# Patient Record
Sex: Female | Born: 1945
Health system: Southern US, Community
[De-identification: ages and names within clinical notes are randomized; demographics above are authoritative.]

## PROBLEM LIST (undated history)

## (undated) DIAGNOSIS — T783XXA Angioneurotic edema, initial encounter: Secondary | ICD-10-CM

## (undated) DIAGNOSIS — M1990 Unspecified osteoarthritis, unspecified site: Secondary | ICD-10-CM

## (undated) DIAGNOSIS — T7840XA Allergy, unspecified, initial encounter: Secondary | ICD-10-CM

## (undated) DIAGNOSIS — J82 Pulmonary eosinophilia, not elsewhere classified: Secondary | ICD-10-CM

## (undated) DIAGNOSIS — J8281 Chronic eosinophilic pneumonia: Secondary | ICD-10-CM

## (undated) DIAGNOSIS — T4145XA Adverse effect of unspecified anesthetic, initial encounter: Secondary | ICD-10-CM

## (undated) DIAGNOSIS — K449 Diaphragmatic hernia without obstruction or gangrene: Secondary | ICD-10-CM

## (undated) DIAGNOSIS — K219 Gastro-esophageal reflux disease without esophagitis: Secondary | ICD-10-CM

## (undated) DIAGNOSIS — R0989 Other specified symptoms and signs involving the circulatory and respiratory systems: Secondary | ICD-10-CM

## (undated) DIAGNOSIS — J449 Chronic obstructive pulmonary disease, unspecified: Secondary | ICD-10-CM

## (undated) DIAGNOSIS — G4730 Sleep apnea, unspecified: Secondary | ICD-10-CM

## (undated) DIAGNOSIS — T8859XA Other complications of anesthesia, initial encounter: Secondary | ICD-10-CM

## (undated) DIAGNOSIS — R600 Localized edema: Secondary | ICD-10-CM

## (undated) DIAGNOSIS — J189 Pneumonia, unspecified organism: Secondary | ICD-10-CM

## (undated) DIAGNOSIS — E039 Hypothyroidism, unspecified: Secondary | ICD-10-CM

## (undated) DIAGNOSIS — J984 Other disorders of lung: Secondary | ICD-10-CM

## (undated) DIAGNOSIS — R51 Headache: Secondary | ICD-10-CM

## (undated) DIAGNOSIS — R519 Headache, unspecified: Secondary | ICD-10-CM

## (undated) DIAGNOSIS — R062 Wheezing: Secondary | ICD-10-CM

## (undated) DIAGNOSIS — IMO0001 Reserved for inherently not codable concepts without codable children: Secondary | ICD-10-CM

## (undated) DIAGNOSIS — I10 Essential (primary) hypertension: Secondary | ICD-10-CM

## (undated) HISTORY — DX: Angioneurotic edema, initial encounter: T78.3XXA

## (undated) HISTORY — DX: Chronic obstructive pulmonary disease, unspecified: J44.9

## (undated) HISTORY — DX: Headache: R51

## (undated) HISTORY — DX: Allergy, unspecified, initial encounter: T78.40XA

## (undated) HISTORY — DX: Chronic eosinophilic pneumonia: J82.81

## (undated) HISTORY — DX: Other specified symptoms and signs involving the circulatory and respiratory systems: R09.89

## (undated) HISTORY — DX: Gastro-esophageal reflux disease without esophagitis: K21.9

## (undated) HISTORY — DX: Unspecified osteoarthritis, unspecified site: M19.90

## (undated) HISTORY — DX: Pulmonary eosinophilia, not elsewhere classified: J82

## (undated) HISTORY — DX: Diaphragmatic hernia without obstruction or gangrene: K44.9

## (undated) HISTORY — DX: Other disorders of lung: J98.4

## (undated) HISTORY — DX: Headache, unspecified: R51.9

## (undated) HISTORY — DX: Hypothyroidism, unspecified: E03.9

---
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
## 2005-12-28 ENCOUNTER — Ambulatory Visit: Payer: Self-pay | Admitting: Family Medicine

## 2006-01-09 ENCOUNTER — Ambulatory Visit: Payer: Self-pay | Admitting: Pulmonary Disease

## 2006-01-17 ENCOUNTER — Ambulatory Visit: Payer: Self-pay | Admitting: Pulmonary Disease

## 2006-02-06 DIAGNOSIS — T783XXA Angioneurotic edema, initial encounter: Secondary | ICD-10-CM

## 2006-02-06 HISTORY — DX: Angioneurotic edema, initial encounter: T78.3XXA

## 2006-02-19 ENCOUNTER — Emergency Department (HOSPITAL_COMMUNITY): Admission: EM | Admit: 2006-02-19 | Discharge: 2006-02-20 | Payer: Self-pay | Admitting: Emergency Medicine

## 2006-02-21 ENCOUNTER — Ambulatory Visit: Payer: Self-pay | Admitting: Family Medicine

## 2006-03-27 ENCOUNTER — Ambulatory Visit: Payer: Self-pay | Admitting: Family Medicine

## 2006-03-27 ENCOUNTER — Encounter: Payer: Self-pay | Admitting: Family Medicine

## 2006-03-27 ENCOUNTER — Other Ambulatory Visit: Admission: RE | Admit: 2006-03-27 | Discharge: 2006-03-27 | Payer: Self-pay | Admitting: Family Medicine

## 2006-03-27 LAB — CONVERTED CEMR LAB: Pap Smear: NORMAL

## 2006-04-27 ENCOUNTER — Ambulatory Visit: Payer: Self-pay | Admitting: Family Medicine

## 2006-07-06 ENCOUNTER — Ambulatory Visit: Payer: Self-pay | Admitting: Family Medicine

## 2007-05-15 ENCOUNTER — Encounter: Payer: Self-pay | Admitting: Family Medicine

## 2007-05-15 DIAGNOSIS — R32 Unspecified urinary incontinence: Secondary | ICD-10-CM | POA: Insufficient documentation

## 2007-05-15 DIAGNOSIS — E7800 Pure hypercholesterolemia, unspecified: Secondary | ICD-10-CM | POA: Insufficient documentation

## 2007-05-15 DIAGNOSIS — R011 Cardiac murmur, unspecified: Secondary | ICD-10-CM | POA: Insufficient documentation

## 2007-05-15 DIAGNOSIS — J45909 Unspecified asthma, uncomplicated: Secondary | ICD-10-CM | POA: Insufficient documentation

## 2007-05-15 DIAGNOSIS — J309 Allergic rhinitis, unspecified: Secondary | ICD-10-CM | POA: Insufficient documentation

## 2007-05-15 DIAGNOSIS — K056 Periodontal disease, unspecified: Secondary | ICD-10-CM | POA: Insufficient documentation

## 2007-05-15 DIAGNOSIS — K069 Disorder of gingiva and edentulous alveolar ridge, unspecified: Secondary | ICD-10-CM | POA: Insufficient documentation

## 2007-05-17 ENCOUNTER — Ambulatory Visit: Payer: Self-pay | Admitting: Family Medicine

## 2007-05-20 LAB — CONVERTED CEMR LAB
Albumin: 3.7 g/dL (ref 3.5–5.2)
Basophils Absolute: 0.1 10*3/uL (ref 0.0–0.1)
Bilirubin, Direct: 0.1 mg/dL (ref 0.0–0.3)
Chloride: 102 meq/L (ref 96–112)
Cholesterol: 169 mg/dL (ref 0–200)
Eosinophils Absolute: 1.3 10*3/uL — ABNORMAL HIGH (ref 0.0–0.6)
GFR calc Af Amer: 73 mL/min
GFR calc non Af Amer: 60 mL/min
HCT: 39.2 % (ref 36.0–46.0)
Lymphocytes Relative: 53 % — ABNORMAL HIGH (ref 12.0–46.0)
MCHC: 34.6 g/dL (ref 30.0–36.0)
MCV: 91 fL (ref 78.0–100.0)
Monocytes Absolute: 0.4 10*3/uL (ref 0.2–0.7)
Neutro Abs: 1.3 10*3/uL — ABNORMAL LOW (ref 1.4–7.7)
Neutrophils Relative %: 19.9 % — ABNORMAL LOW (ref 43.0–77.0)
Potassium: 3.9 meq/L (ref 3.5–5.1)
RBC: 4.31 M/uL (ref 3.87–5.11)
Sodium: 136 meq/L (ref 135–145)
TSH: 4.34 microintl units/mL (ref 0.35–5.50)
Total CHOL/HDL Ratio: 2.2

## 2007-05-27 ENCOUNTER — Encounter: Payer: Self-pay | Admitting: Family Medicine

## 2007-07-04 ENCOUNTER — Encounter: Payer: Self-pay | Admitting: Family Medicine

## 2007-08-01 ENCOUNTER — Ambulatory Visit: Payer: Self-pay | Admitting: Family Medicine

## 2007-08-01 DIAGNOSIS — K219 Gastro-esophageal reflux disease without esophagitis: Secondary | ICD-10-CM | POA: Insufficient documentation

## 2007-11-20 ENCOUNTER — Ambulatory Visit: Payer: Self-pay | Admitting: Family Medicine

## 2007-11-28 ENCOUNTER — Ambulatory Visit: Payer: Self-pay | Admitting: Family Medicine

## 2008-01-09 ENCOUNTER — Encounter: Payer: Self-pay | Admitting: Family Medicine

## 2008-02-18 ENCOUNTER — Ambulatory Visit: Payer: Self-pay | Admitting: Family Medicine

## 2008-02-18 DIAGNOSIS — N951 Menopausal and female climacteric states: Secondary | ICD-10-CM | POA: Insufficient documentation

## 2008-02-18 DIAGNOSIS — T50905A Adverse effect of unspecified drugs, medicaments and biological substances, initial encounter: Secondary | ICD-10-CM

## 2008-02-18 DIAGNOSIS — R635 Abnormal weight gain: Secondary | ICD-10-CM

## 2008-02-18 DIAGNOSIS — R030 Elevated blood-pressure reading, without diagnosis of hypertension: Secondary | ICD-10-CM | POA: Insufficient documentation

## 2008-02-18 HISTORY — DX: Adverse effect of unspecified drugs, medicaments and biological substances, initial encounter: T50.905A

## 2008-02-18 HISTORY — DX: Menopausal and female climacteric states: N95.1

## 2008-02-18 HISTORY — DX: Adverse effect of unspecified drugs, medicaments and biological substances, initial encounter: R63.5

## 2008-03-25 ENCOUNTER — Telehealth: Payer: Self-pay | Admitting: Family Medicine

## 2008-03-25 ENCOUNTER — Encounter: Payer: Self-pay | Admitting: Family Medicine

## 2008-04-15 ENCOUNTER — Ambulatory Visit: Payer: Self-pay | Admitting: Family Medicine

## 2008-04-15 DIAGNOSIS — M1990 Unspecified osteoarthritis, unspecified site: Secondary | ICD-10-CM | POA: Insufficient documentation

## 2008-04-15 DIAGNOSIS — M7550 Bursitis of unspecified shoulder: Secondary | ICD-10-CM | POA: Insufficient documentation

## 2008-04-20 ENCOUNTER — Telehealth: Payer: Self-pay | Admitting: Family Medicine

## 2008-06-04 ENCOUNTER — Ambulatory Visit: Payer: Self-pay | Admitting: Family Medicine

## 2008-06-04 ENCOUNTER — Telehealth: Payer: Self-pay | Admitting: Family Medicine

## 2008-06-05 LAB — CONVERTED CEMR LAB
AST: 20 units/L (ref 0–37)
Albumin: 3.7 g/dL (ref 3.5–5.2)
BUN: 10 mg/dL (ref 6–23)
Basophils Absolute: 0 10*3/uL (ref 0.0–0.1)
Chloride: 108 meq/L (ref 96–112)
Cholesterol: 146 mg/dL (ref 0–200)
Creatinine, Ser: 1 mg/dL (ref 0.4–1.2)
Eosinophils Absolute: 1.6 10*3/uL — ABNORMAL HIGH (ref 0.0–0.7)
GFR calc Af Amer: 73 mL/min
GFR calc non Af Amer: 60 mL/min
HCT: 42.6 % (ref 36.0–46.0)
HDL: 80.9 mg/dL (ref >39.0)
MCHC: 33.7 g/dL (ref 30.0–36.0)
MCV: 91.2 fL (ref 78.0–100.0)
Monocytes Absolute: 0.5 10*3/uL (ref 0.1–1.0)
Neutrophils Relative %: 26.5 % — ABNORMAL LOW (ref 43.0–77.0)
Platelets: 204 10*3/uL (ref 150–400)
Potassium: 3.8 meq/L (ref 3.5–5.1)
TSH: 4.49 microintl units/mL (ref 0.35–5.50)
Total Bilirubin: 0.7 mg/dL (ref 0.3–1.2)
Triglycerides: 50 mg/dL (ref 0–149)
VLDL: 10 mg/dL (ref 0–40)

## 2008-06-17 ENCOUNTER — Ambulatory Visit: Payer: Self-pay | Admitting: Family Medicine

## 2008-06-25 ENCOUNTER — Encounter: Payer: Self-pay | Admitting: Family Medicine

## 2008-06-30 ENCOUNTER — Ambulatory Visit: Payer: Self-pay | Admitting: Unknown Physician Specialty

## 2008-07-13 ENCOUNTER — Ambulatory Visit: Payer: Self-pay | Admitting: Family Medicine

## 2008-07-20 ENCOUNTER — Ambulatory Visit: Payer: Self-pay | Admitting: Unknown Physician Specialty

## 2008-07-20 ENCOUNTER — Encounter: Payer: Self-pay | Admitting: Family Medicine

## 2008-08-06 ENCOUNTER — Encounter: Payer: Self-pay | Admitting: Family Medicine

## 2008-08-18 ENCOUNTER — Ambulatory Visit: Payer: Self-pay | Admitting: Family Medicine

## 2008-08-18 DIAGNOSIS — J329 Chronic sinusitis, unspecified: Secondary | ICD-10-CM | POA: Insufficient documentation

## 2008-09-21 ENCOUNTER — Encounter: Payer: Self-pay | Admitting: Family Medicine

## 2008-09-24 ENCOUNTER — Encounter: Payer: Self-pay | Admitting: Family Medicine

## 2008-10-01 ENCOUNTER — Encounter (INDEPENDENT_AMBULATORY_CARE_PROVIDER_SITE_OTHER): Payer: Self-pay | Admitting: *Deleted

## 2009-01-07 ENCOUNTER — Encounter: Payer: Self-pay | Admitting: Family Medicine

## 2009-03-03 ENCOUNTER — Ambulatory Visit: Payer: Self-pay | Admitting: Family Medicine

## 2009-03-03 DIAGNOSIS — R609 Edema, unspecified: Secondary | ICD-10-CM | POA: Insufficient documentation

## 2009-03-05 LAB — CONVERTED CEMR LAB
AST: 24 units/L (ref 0–37)
Alkaline Phosphatase: 101 units/L (ref 39–117)
BUN: 16 mg/dL (ref 6–23)
Bilirubin, Direct: 0.1 mg/dL (ref 0.0–0.3)
CO2: 27 meq/L (ref 19–32)
Chloride: 109 meq/L (ref 96–112)
Phosphorus: 3.9 mg/dL (ref 2.3–4.6)
Potassium: 3.8 meq/L (ref 3.5–5.1)
TSH: 4.39 microintl units/mL (ref 0.35–5.50)
Total Bilirubin: 0.7 mg/dL (ref 0.3–1.2)

## 2009-03-25 ENCOUNTER — Telehealth: Payer: Self-pay | Admitting: Family Medicine

## 2009-04-15 ENCOUNTER — Ambulatory Visit: Payer: Self-pay | Admitting: Family Medicine

## 2009-05-10 ENCOUNTER — Ambulatory Visit: Payer: Self-pay | Admitting: Family Medicine

## 2009-05-11 LAB — CONVERTED CEMR LAB
Alkaline Phosphatase: 104 units/L (ref 39–117)
BUN: 13 mg/dL (ref 6–23)
Basophils Relative: 0.5 % (ref 0.0–3.0)
Bilirubin, Direct: 0.2 mg/dL (ref 0.0–0.3)
Calcium: 9 mg/dL (ref 8.4–10.5)
Creatinine, Ser: 1.1 mg/dL (ref 0.4–1.2)
Eosinophils Absolute: 0.7 10*3/uL (ref 0.0–0.7)
HCT: 39.5 % (ref 36.0–46.0)
Hemoglobin: 14 g/dL (ref 12.0–15.0)
LDL Cholesterol: 92 mg/dL (ref 0–99)
Lymphocytes Relative: 49.4 % — ABNORMAL HIGH (ref 12.0–46.0)
Lymphs Abs: 3.6 10*3/uL (ref 0.7–4.0)
MCHC: 35.3 g/dL (ref 30.0–36.0)
MCV: 87.3 fL (ref 78.0–100.0)
Neutro Abs: 2.5 10*3/uL (ref 1.4–7.7)
Phosphorus: 4.1 mg/dL (ref 2.3–4.6)
Potassium: 4.2 meq/L (ref 3.5–5.1)
RBC: 4.53 M/uL (ref 3.87–5.11)
RDW: 12.7 % (ref 11.5–14.6)
Total Bilirubin: 0.8 mg/dL (ref 0.3–1.2)
Total CHOL/HDL Ratio: 3
Total Protein: 7 g/dL (ref 6.0–8.3)
VLDL: 12.8 mg/dL (ref 0.0–40.0)

## 2009-06-01 ENCOUNTER — Ambulatory Visit: Payer: Self-pay | Admitting: Family Medicine

## 2009-06-01 DIAGNOSIS — E039 Hypothyroidism, unspecified: Secondary | ICD-10-CM | POA: Insufficient documentation

## 2009-07-13 ENCOUNTER — Encounter: Payer: Self-pay | Admitting: Family Medicine

## 2009-07-20 ENCOUNTER — Ambulatory Visit: Payer: Self-pay | Admitting: Family Medicine

## 2009-09-07 ENCOUNTER — Ambulatory Visit: Payer: Self-pay | Admitting: Family Medicine

## 2009-12-08 ENCOUNTER — Ambulatory Visit: Payer: Self-pay | Admitting: Family Medicine

## 2010-01-20 ENCOUNTER — Telehealth: Payer: Self-pay | Admitting: Family Medicine

## 2010-02-02 ENCOUNTER — Ambulatory Visit: Payer: Self-pay | Admitting: Family Medicine

## 2010-02-10 ENCOUNTER — Encounter: Payer: Self-pay | Admitting: Family Medicine

## 2010-04-05 ENCOUNTER — Ambulatory Visit: Payer: Self-pay | Admitting: Family Medicine

## 2010-04-06 LAB — CONVERTED CEMR LAB
ALT: 12 units/L (ref 0–35)
CO2: 29 meq/L (ref 19–32)
Creatinine, Ser: 1 mg/dL (ref 0.4–1.2)
GFR calc non Af Amer: 74.79 mL/min (ref >60)
Glucose, Bld: 88 mg/dL (ref 70–99)
HDL: 59.4 mg/dL (ref >39.00)
Potassium: 3.9 meq/L (ref 3.5–5.1)
Sodium: 142 meq/L (ref 135–145)
Total CHOL/HDL Ratio: 4
Triglycerides: 79 mg/dL (ref 0.0–149.0)

## 2010-05-03 ENCOUNTER — Telehealth: Payer: Self-pay | Admitting: Family Medicine

## 2010-07-20 ENCOUNTER — Ambulatory Visit: Payer: Self-pay | Admitting: Family Medicine

## 2010-08-04 ENCOUNTER — Encounter: Payer: Self-pay | Admitting: Family Medicine

## 2010-09-26 ENCOUNTER — Ambulatory Visit: Payer: Self-pay | Admitting: Family Medicine

## 2010-09-26 LAB — CONVERTED CEMR LAB
ALT: 13 units/L (ref 0–35)
AST: 16 units/L (ref 0–37)
Total CHOL/HDL Ratio: 3
Triglycerides: 59 mg/dL (ref 0.0–149.0)

## 2010-10-06 ENCOUNTER — Ambulatory Visit: Payer: Self-pay | Admitting: Family Medicine

## 2010-11-08 NOTE — Assessment & Plan Note (Signed)
Summary: BODY ACHES,COUGH,CONGESTION/CLE   Vital Signs:  Patient profile:   65 year old female Height:      67 inches Weight:      268.75 pounds BMI:     42.24 Temp:     99.7 degrees F oral Pulse rate:   92 / minute Pulse rhythm:   regular BP sitting:   116 / 74  (left arm) Cuff size:   large  Vitals Entered By: Delilah Shan CMA Duncan Dull) (December 08, 2009 8:58 AM) CC: Body aches, cough, congestion   History of Present Illness: is sick since last week  body aches - and some sweats -- and chills - at least low grade fever  nasal congestion and chest congestion - is sore from that  prod cough - with phlegm that is yellow- green and sometimes clear   just a little wheeze  some headache -- thinks she has sinus pressure  throat is sore too     Allergies: 1)  Erythromycin  Past History:  Past Medical History: Last updated: 06/01/2009 Allergic rhinitis Asthma Urinary incontinence OA knees  labile bp hiatal hernia hyothyroid  all- [facility]  Past Surgical History: Last updated: 07/25/2008 Caesarean section x 2 Breast biopsy (1966) Colonoscopy (2005) Pneumonia, 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 pre diabetic Siblings:  GM and aunt with colon ca DM distant relatives lots of family with high chol  Social History: Last updated: 08/18/2008 Marital Status: divorced Children: 2 Occupation: retired non smoker  no alcohol   Risk Factors: Smoking Status: never (05/15/2007)  Review of Systems General:  Complains of chills, fatigue, fever, loss of appetite, and malaise. Eyes:  Complains of eye irritation; denies discharge. ENT:  Complains of nasal congestion, postnasal drainage, sinus pressure, and sore throat; denies ear discharge and earache. CV:  Denies  chest pain or discomfort and palpitations. Resp:  Complains of cough and sputum productive. GI:  Denies diarrhea, nausea, and vomiting. Derm:  Denies lesion(s), poor wound healing, and rash.  Physical Exam  General:  Well-developed,well-nourished,in no acute distress; alert,appropriate and cooperative throughout examination Head:  no sinus tenderness Eyes:  vision grossly intact, pupils equal, pupils round, pupils reactive to light, and no injection.   Ears:  R ear normal and L ear normal.   Nose:  nares are injected and congested bilaterally  some clear rhinorrhea  hoarse voice Mouth:  pharynx pink and moist, no erythema, and no exudates.   Neck:  No deformities, masses, or tenderness noted. Lungs:  some isolated rhonchi at bases - no rales good air exch without sob or wheeze today Heart:  Normal rate and regular rhythm. S1 and S2 normal without gallop, murmur, click, rub or other extra sounds. Skin:  Intact without suspicious lesions or rashes Cervical Nodes:  No lymphadenopathy noted Psych:  normal affect, talkative and pleasant    Impression & Recommendations:  Problem # 1:  BRONCHITIS- ACUTE (ICD-466.0) Assessment New with cough and congestion but no exac of reactive airways will cover with augmentin robitussin- codiene as needed cough with warning of sedation pt advised to update me if symptoms worsen or do not improve - esp if any wheeze or sob or not imp in 1 wk The following medications were removed from the medication list:    Zithromax Z-pak 250 Mg Tabs (Azithromycin) .Marland Kitchen... Take  by mouth as directed Her updated medication list for this problem includes:    Singulair 10 Mg Tabs (Montelukast sodium) ..... One by mouth every day    Albuterol 90 Mcg/act Aers (Albuterol) ..... Use as directed    Accuneb 0.63 Mg/58ml Nebu (Albuterol sulfate) ..... Use 1 vial for nebulizer treatment q 6 hours as needed wheezing    Advair Diskus 250-50 Mcg/dose Misc (Fluticasone-salmeterol)  ..... Use as directed    Augmentin 875-125 Mg Tabs (Amoxicillin-pot clavulanate) .Marland Kitchen... 1 by mouth two times a day for 10 days    Guaifenesin-codeine 100-10 Mg/31ml Soln (Guaifenesin-codeine) .Marland Kitchen... 1-2 teaspoons by mouth up to every 4-6 hours as needed cough  warn of sedation  Complete Medication List: 1)  Lipitor 10 Mg Tabs (Atorvastatin calcium) .Marland Kitchen.. 1 by mouth once daily 2)  Singulair 10 Mg Tabs (Montelukast sodium) .... One by mouth every day 3)  Albuterol 90 Mcg/act Aers (Albuterol) .... Use as directed 4)  Accuneb 0.63 Mg/52ml Nebu (Albuterol sulfate) .... Use 1 vial for nebulizer treatment q 6 hours as needed wheezing 5)  Astepro 137 Mcg/spray Soln (Azelastine hcl) .... Use as directed 6)  Advair Diskus 250-50 Mcg/dose Misc (Fluticasone-salmeterol) .... Use as directed 7)  Clarinex 5 Mg Tabs (Desloratadine) .... One by mouth every day 8)  Ibuprofen 800 Mg Tabs (Ibuprofen) .... Take 1 by mouth up to three times a day as needed joint pain take with food and stop if stomach upset 9)  Px Omeprazole 20 Mg Tbec (Omeprazole) .Marland Kitchen.. 1 by mouth two times a day 10)  Lasix 20 Mg Tabs (Furosemide) .Marland Kitchen.. 1 by mouth once daily in am 11)  Excedrin Extra Strength 250-250-65 Mg Tabs (Aspirin-acetaminophen-caffeine) .... Otc as directed 12)  Sinus Rinse Kit Pack (Hypertonic nasal wash) .... Otc as directed 13)  Synthroid 50 Mcg Tabs (Levothyroxine sodium) .Marland Kitchen.. 1 by mouth once daily 14)  Augmentin 875-125 Mg Tabs (Amoxicillin-pot clavulanate) .Marland Kitchen.. 1 by mouth two times a day for 10 days 15)  Guaifenesin-codeine 100-10 Mg/61ml Soln (Guaifenesin-codeine) .Marland Kitchen.. 1-2 teaspoons by mouth up to every 4-6 hours as needed cough  warn of sedation  Patient Instructions: 1)  get lots of fluids and rest  2)  take the augmentin as directed for bronchitis  3)  use the cough syrup with caution since it can sedate  4)  update me if not starting to improve in 1 week 5)  use tylenol for fever or body aches   Prescriptions: GUAIFENESIN-CODEINE 100-10 MG/5ML SOLN (GUAIFENESIN-CODEINE) 1-2 teaspoons by mouth up to every 4-6 hours as needed cough  warn of sedation  #120cc x 0   Entered and Authorized by:   Judith Part MD   Signed by:   Judith Part MD on 12/08/2009   Method used:   Print then Give to Patient   RxID:   [id number] AUGMENTIN 875-125 MG TABS (AMOXICILLIN-POT CLAVULANATE) 1 by mouth two times a day for 10 days  #20 x 0   Entered and Authorized by:   Judith Part MD   Signed by:   Judith Part MD on 12/08/2009   Method used:   Print then Give to Patient   RxID:   [id number]   Current Allergies (reviewed today): ERYTHROMYCIN

## 2010-11-08 NOTE — Progress Notes (Signed)
Summary: Ibuprofen 800mg  rx  Phone Note Refill Request Call back at [phone] Message from:  [facility] on May 03, 2010 4:26 PM  Refills Requested: Medication #1:  IBUPROFEN 800 MG  TABS take 1 by mouth up to three times a day as needed joint pain take with food and stop if stomach upset [facility] electronically requested refill on Ibuprofen 800mg . No date sent when last refilled. According to med list last refilled 04/20/2008.Please advise.    Method Requested: Telephone to Pharmacy Initial call taken by: Lewanda Rife LPN,  May 03, 2010 4:27 PM  Follow-up for Phone Call        px written on EMR for call in  Follow-up by: Judith Part MD,  May 03, 2010 4:47 PM  Additional Follow-up for Phone Call Additional follow up Details #1::        [facility]. Lewanda Rife LPN  May 03, 2010 5:13 PM     New/Updated Medications: IBUPROFEN 800 MG  TABS (IBUPROFEN) take 1 by mouth up to three times a day as needed joint pain take with food and stop if stomach upset Prescriptions: IBUPROFEN 800 MG  TABS (IBUPROFEN) take 1 by mouth up to three times a day as needed joint pain take with food and stop if stomach upset  #90 x 1   Entered and Authorized by:   Judith Part MD   Signed by:   Lewanda Rife LPN on 45/40/9811   Method used:   Telephoned to ...       [facility] (retail)       [address]       [place], [place]  [address]       Ph: [phone] or [phone]       Fax: [phone]   RxID:   [id number]

## 2010-11-08 NOTE — Assessment & Plan Note (Signed)
Summary: FLU SHOT/CLE   Nurse Visit   Allergies: 1)  ! Simvastatin 2)  Erythromycin  Orders Added: 1)  Admin 1st Vaccine [90471] 2)  Flu Vaccine 67yrs + [91478]  Flu Vaccine Consent Questions     Do you have a history of severe allergic reactions to this vaccine? no    Any prior history of allergic reactions to egg and/or gelatin? no    Do you have a sensitivity to the preservative Thimersol? no    Do you have a past history of Guillan-Barre Syndrome? no    Do you currently have an acute febrile illness? no    Have you ever had a severe reaction to latex? no    Vaccine information given and explained to patient? yes    Are you currently pregnant? no    Lot Number:[id number]   Exp Date:04/08/2011   Site Given  Left Deltoid IM

## 2010-11-08 NOTE — Progress Notes (Signed)
Summary: needs to change from lipitor  Phone Note Call from Patient Call back at Home Phone [phone] Call back at [phone]   Caller: Patient Call For: Judith Part MD Summary of Call: Pt states her price for lipitor has gone from $35.00 to $106.00 and she is asking for something else to be called to [facility].  I asked her to call her insurance to see what they would cover, she says she thinks they will cover anything that is a generic. Initial call taken by: Lowella Petties CMA,  January 20, 2010 12:06 PM  Follow-up for Phone Call        did you mean anything that IS a generic? Follow-up by: Judith Part MD,  January 20, 2010 12:32 PM  Additional Follow-up for Phone Call Additional follow up Details #1::        Yes, sorry.          Lowella Petties CMA  January 20, 2010 12:37 PM  will change to simvastatin 20 which hopefully will be equivalent let me know if any side eff or problems  px written on EMR for call in  Additional Follow-up by: Judith Part MD,  January 20, 2010 12:40 PM    Additional Follow-up for Phone Call Additional follow up Details #2::    [facility]oned [facility] as instructed. Left message for patient to call back. Lewanda Rife LPN  January 20, 2010 2:45 PM   Patient notified as instructed by telephone. Lewanda Rife LPN  January 20, 2010 4:14 PM   New/Updated Medications: SIMVASTATIN 20 MG TABS (SIMVASTATIN) 1 by mouth once daily Prescriptions: SIMVASTATIN 20 MG TABS (SIMVASTATIN) 1 by mouth once daily  #30 x 11   Entered and Authorized by:   Judith Part MD   Signed by:   Lewanda Rife LPN on 30/86/5784   Method used:   Telephoned to ...       [facility] (retail)       [address]       [place], [place]  [address]       Ph: [phone] or [phone]       Fax: [phone]   RxID:   [id number]

## 2010-11-08 NOTE — Letter (Signed)
Summary: [facility]  [facility]   Imported By: Lanelle Bal 08/16/2010 08:18:56  _____________________________________________________________________  External Attachment:    Type:   Image     Comment:   External Document

## 2010-11-08 NOTE — Letter (Signed)
Summary: [facility]   [facility]   Imported By: Lanelle Bal 03/03/2010 08:43:08  _____________________________________________________________________  External Attachment:    Type:   Image     Comment:   External Document

## 2010-11-08 NOTE — Assessment & Plan Note (Signed)
Summary: LEGS ARE SWELLING   Vital Signs:  Patient profile:   65 year old female Height:      67 inches Weight:      269.25 pounds BMI:     42.32 Temp:     97.9 degrees F oral Pulse rate:   80 / minute Pulse rhythm:   regular BP sitting:   116 / 74  (left arm) Cuff size:   large  Vitals Entered By: Lewanda Rife LPN (February 02, 2010 9:32 AM) CC: Both legs swelling   History of Present Illness: is having more leg swelling  is taking her lasix -- and not sure it is still working  worse a week ago after driving  still some swelling when she drives or walks  is interested in stockings  no sob or chest pain    changed from lipitor to simvastain  has not felt well since switching to it -- not the same  ? has only been taking for a week or so  cannot have grapefruit - is a problem  diet is pretty good -- but eats 3 eggs per week some shrimp  does not have much rd meat -- more chicken and [place] some fried foods and fast food     Allergies: 1)  ! 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 pre diabetic Siblings:  GM and aunt with colon ca DM distant relatives lots of family with high chol  Social History: Last updated: 08/18/2008 Marital Status: divorced Children: 2 Occupation: retired non smoker  no alcohol   Risk Factors: Smoking Status: never (05/15/2007)  Review of Systems General:  Complains of fatigue; denies loss of appetite and  malaise. Eyes:  Denies blurring and eye pain. CV:  Complains of swelling of feet; denies chest pain or discomfort and palpitations. Resp:  Denies cough, shortness of breath, sputum productive, and wheezing. GI:  Denies abdominal pain, bloody stools, change in bowel habits, and indigestion. GU:  Denies urinary frequency. MS:  Denies joint redness, joint swelling, and cramps. Derm:  Denies lesion(s), poor wound healing, and rash. Neuro:  Denies numbness and tingling. Psych:  mood is ok . Endo:  Denies cold intolerance, excessive thirst, excessive urination, and heat intolerance. Heme:  Denies abnormal bruising and bleeding.  Physical Exam  General:  Well-developed,well-nourished,in no acute distress; alert,appropriate and cooperative throughout examination Head:  normocephalic, atraumatic, and no abnormalities observed.   Eyes:  vision grossly intact, pupils equal, pupils round, pupils reactive to light, and no injection.   Mouth:  pharynx pink and moist.   Neck:  supple with full rom and no masses or thyromegally, no JVD or carotid bruit  Lungs:  Normal respiratory effort, chest expands symmetrically. Lungs are clear to auscultation, no crackles or wheezes. Heart:  Normal rate and regular rhythm. S1 and S2 normal without gallop, murmur, click, rub or other extra sounds. (no M today) Abdomen:  no renal bruits  soft, non-tender, no hepatomegaly, and no splenomegaly.   Msk:  No deformity or scoliosis noted of thoracic or lumbar spine.  no acute joint changes  Pulses:  R and L carotid,radial,femoral,dorsalis pedis and posterior tibial pulses are full and equal bilaterally Extremities:  one plus pedal edema to knee bilat  no varicosities nl perfusion Neurologic:  sensation intact to light touch, gait normal, and DTRs symmetrical and normal.   Skin:  Intact without suspicious lesions or rashes Cervical Nodes:  No lymphadenopathy noted Inguinal Nodes:  No significant adenopathy Psych:   normal affect, talkative and pleasant    Impression & Recommendations:  Problem # 1:  EDEMA (ICD-782.3) Assessment Deteriorated partially helped by lasix -worse with sitting and hot weather I feel this would imp with wt loss and exercise  also elevation of legs and less salt in diet  px for supp hose to try and update  no cardiovasc symptoms- but rev red flags to watch for  Her updated medication list for this problem includes:    Lasix 20 Mg Tabs (Furosemide) .Marland Kitchen... 1 by mouth once daily in am  Problem # 2:  Hx of HYPERCHOLESTEROLEMIA (ICD-272.0) Assessment: Deteriorated  pt wants to stop simvastatin due to side eff and try diet for 2 mo  rev low sat fat diet in detail  re check 2 mo and adv  The following medications were removed from the medication list:    Simvastatin 20 Mg Tabs (Simvastatin) .Marland Kitchen... 1 by mouth once daily  Labs Reviewed: SGOT: 19 (05/10/2009)   SGPT: 18 (05/10/2009)   HDL:67.40 (05/10/2009), 80.9 (06/04/2008)  LDL:92 (05/10/2009), 55 (06/04/2008)  Chol:172 (05/10/2009), 146 (06/04/2008)  Trig:64.0 (05/10/2009), 50 (06/04/2008)  Complete Medication List: 1)  Singulair 10 Mg Tabs (Montelukast sodium) .... One by mouth every day 2)  Albuterol 90 Mcg/act Aers (Albuterol) .... Use as directed 3)  Accuneb 0.63 Mg/37ml Nebu (Albuterol sulfate) .... Use 1 vial for nebulizer treatment q 6 hours as needed wheezing 4)  Astepro 137 Mcg/spray Soln (Azelastine hcl) .... Use as directed 5)  Advair Diskus 250-50 Mcg/dose Misc (Fluticasone-salmeterol) .... Use as directed 6)  Clarinex 5 Mg Tabs (Desloratadine) .... One by mouth every day 7)  Ibuprofen 800 Mg Tabs (Ibuprofen) .... Take 1 by mouth up to three times a day as needed joint pain take with food and stop if stomach upset 8)  Px Omeprazole 20 Mg Tbec (Omeprazole) .Marland Kitchen.. 1 by mouth daily 9)  Lasix 20 Mg Tabs (Furosemide) .Marland Kitchen.. 1 by mouth once daily in am 10)  Excedrin Extra Strength 250-250-65 Mg Tabs  (Aspirin-acetaminophen-caffeine) .... Otc as directed 11)  Sinus Rinse Kit Pack (Hypertonic nasal wash) .... Otc as directed 12)  Synthroid 50 Mcg Tabs (Levothyroxine sodium) .Marland Kitchen.. 1 by mouth once daily 13)  Support Stockings To Thigh 15-20 Mm Hg  .... For edema 782.3 to wear during the day and as needed  Patient Instructions: 1)  you can raise your HDL (good cholesterol) by increasing exercise and eating omega 3 fatty acid supplement like fish oil or flax seed oil over the counter 2)  you can lower LDL (bad cholesterol) by limiting saturated fats in diet like red meat, fried foods, egg yolks, fatty breakfast meats, high fat dairy products and shellfish  3)  take the support stocking px to a medical supply shop and try them for swelling 4)  avoid salty and processed foods and drink more water 5)  elevate legs whenever possible 6)  stop the simvastatin and schedule fasting labs in 2 months lipid/ast/alt / renal 272, 782.3  Prescriptions: SUPPORT STOCKINGS TO THIGH   15-20 MM HG  for edema 782.3 to wear during the day and as needed  #1 pair x 2   Entered and Authorized by:   Judith Part MD   Signed by:   Judith Part MD on 02/02/2010   Method used:   Print then Give to Patient   RxID:   [id number]   Current Allergies (reviewed today): ! SIMVASTATIN ERYTHROMYCIN

## 2010-11-10 NOTE — Assessment & Plan Note (Signed)
Summary: FOLLOW UP AFTER LABS/RI   Vital Signs:  Patient profile:   65 year old female Height:      67 inches Weight:      259.25 pounds BMI:     40.75 Temp:     98.1 degrees F oral Pulse rate:   80 / minute Pulse rhythm:   regular BP sitting:   126 / 76  (left arm) Cuff size:   large  Vitals Entered By: Lewanda Rife LPN (October 12, 2010 12:22 PM) CC: follow-up visit after labs   History of Present Illness: here for f/u for hyperlipidemia   wt is down 10 lb  bp great at 126/76  lipids are improved with trig 59 and HDL 63 and LDL 123 downd from 139 no meds at this time -did not tolerate her zocor  was riding her bicycle some -- is exercise bike  has not been eating out as much   eats red meat infreqently  fried foods once per week  no egg yolks  occ cheese and ice cream -- one week out of the month- not often  shellfish once a year  [place] sausage and bacon       Allergies: 1)  ! 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 pre diabetic Siblings:  GM and aunt with colon ca DM distant relatives lots of family with high chol  Social History: Last updated: 08/18/2008 Marital Status: divorced Children: 2 Occupation: retired non smoker  no alcohol   Risk Factors: Smoking Status: never (05/15/2007)  Review of Systems General:  Denies fatigue, loss of appetite, and malaise. Eyes:  Denies blurring and eye irritation. CV:  Denies  chest pain or discomfort, palpitations, and shortness of breath with exertion. Resp:  Denies cough, shortness of breath, and wheezing. GI:  Denies abdominal pain, change in bowel habits, indigestion, and nausea. MS:  Denies muscle aches and cramps. Derm:  Denies itching, lesion(s), poor wound healing, and rash. Neuro:  Denies numbness and tingling. Psych:  Denies anxiety and depression. Endo:  Denies cold intolerance and heat intolerance. Heme:  Denies abnormal bruising and bleeding.  Physical Exam  General:  obese and well appearing  Head:  normocephalic, atraumatic, and no abnormalities observed.   Eyes:  vision grossly intact, pupils equal, pupils round, and pupils reactive to light.   Mouth:  pharynx pink and moist.   Neck:  supple with full rom and no masses or thyromegally, no JVD or carotid bruit  Lungs:  Normal respiratory effort, chest expands symmetrically. Lungs are clear to auscultation, no crackles or wheezes. Heart:  Normal rate and regular rhythm. S1 and S2 normal without gallop, murmur, click, rub or other extra sounds. (no M today) Abdomen:  no renal bruits  Pulses:  R and L carotid,radial,femoral,dorsalis pedis and posterior tibial pulses are full and equal bilaterally Extremities:  one plus pedal edema to knee bilat  no varicosities nl perfusion Neurologic:  sensation intact to light touch, gait normal, and DTRs symmetrical and normal.   Skin:  Intact without suspicious lesions  or rashes Cervical Nodes:  No lymphadenopathy noted Inguinal Nodes:  No significant adenopathy Psych:  normal affect, talkative and pleasant    Impression & Recommendations:  Problem # 1:  Hx of HYPERCHOLESTEROLEMIA (ICD-272.0) Assessment Improved this is improved with better diet and wt loss rev her labs in detail  do not recommend med for now  will continue loosing wt  r echeck in 6 mo before PE  Problem # 2:  EDEMA (ICD-782.3) Assessment: Improved  well controlled with  stockings and lasix Her updated medication list for this problem includes:    Lasix 20 Mg Tabs (Furosemide) .Marland Kitchen... 1 by mouth once daily in am  Discussed elevation of the legs, use of compression stockings, sodium restiction, and medication use.   Complete Medication List: 1)  Singulair 10 Mg Tabs (Montelukast sodium) .... One by mouth every day 2)  Albuterol 90 Mcg/act Aers (Albuterol) .... Use as directed 3)  Accuneb 0.63 Mg/83ml Nebu (Albuterol sulfate) .... Use 1 vial for nebulizer treatment q 6 hours as needed wheezing 4)  Astepro 137 Mcg/spray Soln (Azelastine hcl) .... Use as directed 5)  Advair Diskus 250-50 Mcg/dose Misc (Fluticasone-salmeterol) .... Use as directed 6)  Ibuprofen 800 Mg Tabs (Ibuprofen) .... Take 1 by mouth up to three times a day as needed joint pain take with food and stop if stomach upset 7)  Px Omeprazole 20 Mg Tbec (Omeprazole) .Marland Kitchen.. 1 by mouth daily 8)  Lasix 20 Mg Tabs (Furosemide) .Marland Kitchen.. 1 by mouth once daily in am 9)  Excedrin Extra Strength 250-250-65 Mg Tabs (Aspirin-acetaminophen-caffeine) .... Otc as directed 10)  Sinus Rinse Kit Pack (Hypertonic nasal wash) .... Otc as directed 11)  Synthroid 50 Mcg Tabs (Levothyroxine sodium) .Marland Kitchen.. 1 by mouth once daily 12)  Support Stockings To Thigh 15-20 Mm Hg  .... For edema 782.3 to wear during the day and as needed 13)  Allegra Allergy 60 Mg Tabs (Fexofenadine hcl) .... Otc as directed.  Patient Instructions: 1)  you can raise your HDL (good cholesterol) by increasing exercise and eating omega 3 fatty acid supplement like fish oil or flax seed oil over the counter 2)  you can lower LDL (bad cholesterol) by limiting saturated fats in diet like red meat, fried foods, egg yolks, fatty breakfast meats, high fat dairy products and shellfish  3)  no cholesterol medicine for now  4)  keep exercising and loosing weight  5)  schedule fasting labs and then PE In about 6 months    Orders Added: 1)  Est. Patient Level III  [76283]    Current Allergies (reviewed today): ! SIMVASTATIN ERYTHROMYCIN

## 2010-11-14 ENCOUNTER — Telehealth: Payer: Self-pay | Admitting: Family Medicine

## 2010-11-24 NOTE — Progress Notes (Signed)
Summary: Ibuprofen 800mg   Phone Note Refill Request Call back at [phone] Message from:  [facility] on November 14, 2010 11:18 AM  Refills Requested: Medication #1:  IBUPROFEN 800 MG  TABS take 1 by mouth up to three times a day as needed joint pain take with food and stop if stomach upset   Last Refilled: 07/02/2010 [facility] electronically request refill on Ibuprofen 800mg . Last refill date 07/02/10.Please advise.    Method Requested: Electronic Initial call taken by: Lewanda Rife LPN,  November 14, 2010 11:19 AM  Follow-up for Phone Call        px written on EMR for call in  Follow-up by: Judith Part MD,  November 14, 2010 11:34 AM  Additional Follow-up for Phone Call Additional follow up Details #1::        Medication [facility]oned [facility] as instructed. Lewanda Rife LPN  November 14, 2010 12:48 PM     New/Updated Medications: IBUPROFEN 800 MG  TABS (IBUPROFEN) take 1 by mouth up to three times a day as needed joint pain take with food and stop if stomach upset Prescriptions: IBUPROFEN 800 MG  TABS (IBUPROFEN) take 1 by mouth up to three times a day as needed joint pain take with food and stop if stomach upset  #90 x 3   Entered and Authorized by:   Judith Part MD   Signed by:   Judith Part MD on 11/14/2010   Method used:   Telephoned to ...       [facility] [facility]retail)       [address]       [place], [place]  [address]       Ph: [phone] or [phone]       Fax: [phone]   RxID:   [id number]

## 2010-12-27 ENCOUNTER — Other Ambulatory Visit: Payer: Self-pay | Admitting: Family Medicine

## 2010-12-29 ENCOUNTER — Other Ambulatory Visit: Payer: Self-pay | Admitting: *Deleted

## 2010-12-29 MED ORDER — LEVOTHYROXINE SODIUM 50 MCG PO TABS: 50.0000 ug | ORAL_TABLET | Freq: Every day | ORAL | Status: DC

## 2010-12-30 NOTE — Telephone Encounter (Signed)
Pt already scheduled CPX with Dr Milinda Antis 04/17/11.

## 2011-03-31 ENCOUNTER — Other Ambulatory Visit: Payer: Self-pay | Admitting: *Deleted

## 2011-03-31 MED ORDER — FUROSEMIDE 20 MG PO TABS: ORAL_TABLET | ORAL | Status: DC

## 2011-04-07 ENCOUNTER — Other Ambulatory Visit (INDEPENDENT_AMBULATORY_CARE_PROVIDER_SITE_OTHER): Payer: BC Managed Care – PPO

## 2011-04-07 DIAGNOSIS — E039 Hypothyroidism, unspecified: Secondary | ICD-10-CM

## 2011-04-07 DIAGNOSIS — IMO0001 Reserved for inherently not codable concepts without codable children: Secondary | ICD-10-CM

## 2011-04-07 DIAGNOSIS — K219 Gastro-esophageal reflux disease without esophagitis: Secondary | ICD-10-CM

## 2011-04-07 DIAGNOSIS — E7800 Pure hypercholesterolemia, unspecified: Secondary | ICD-10-CM

## 2011-04-07 DIAGNOSIS — R030 Elevated blood-pressure reading, without diagnosis of hypertension: Secondary | ICD-10-CM

## 2011-04-07 LAB — LIPID PANEL
Cholesterol: 205 mg/dL — ABNORMAL HIGH (ref 0–200)
HDL: 63.3 mg/dL (ref >39.00)
VLDL: 8.4 mg/dL (ref 0.0–40.0)

## 2011-04-07 LAB — CBC WITH DIFFERENTIAL/PLATELET
Eosinophils Relative: 15.1 % — ABNORMAL HIGH (ref 0.0–5.0)
HCT: 42.2 % (ref 36.0–46.0)
Hemoglobin: 14.2 g/dL (ref 12.0–15.0)
Lymphs Abs: 2.5 10*3/uL (ref 0.7–4.0)
Monocytes Relative: 4.4 % (ref 3.0–12.0)
Neutro Abs: 1.6 10*3/uL (ref 1.4–7.7)
WBC: 5.2 10*3/uL (ref 4.5–10.5)

## 2011-04-07 LAB — BASIC METABOLIC PANEL
CO2: 27 mEq/L (ref 19–32)
Calcium: 8.9 mg/dL (ref 8.4–10.5)
Creatinine, Ser: 0.9 mg/dL (ref 0.4–1.2)
Glucose, Bld: 93 mg/dL (ref 70–99)

## 2011-04-07 LAB — HEPATIC FUNCTION PANEL
Albumin: 3.6 g/dL (ref 3.5–5.2)
Total Bilirubin: 1.1 mg/dL (ref 0.3–1.2)

## 2011-04-08 ENCOUNTER — Encounter: Payer: Self-pay | Admitting: Family Medicine

## 2011-04-11 ENCOUNTER — Other Ambulatory Visit: Payer: Self-pay

## 2011-04-17 ENCOUNTER — Ambulatory Visit (INDEPENDENT_AMBULATORY_CARE_PROVIDER_SITE_OTHER): Payer: BC Managed Care – PPO | Admitting: Family Medicine

## 2011-04-17 ENCOUNTER — Encounter: Payer: Self-pay | Admitting: Family Medicine

## 2011-04-17 DIAGNOSIS — Z1231 Encounter for screening mammogram for malignant neoplasm of breast: Secondary | ICD-10-CM | POA: Insufficient documentation

## 2011-04-17 DIAGNOSIS — R609 Edema, unspecified: Secondary | ICD-10-CM

## 2011-04-17 DIAGNOSIS — E7800 Pure hypercholesterolemia, unspecified: Secondary | ICD-10-CM

## 2011-04-17 DIAGNOSIS — Z0000 Encounter for general adult medical examination without abnormal findings: Secondary | ICD-10-CM | POA: Insufficient documentation

## 2011-04-17 DIAGNOSIS — E039 Hypothyroidism, unspecified: Secondary | ICD-10-CM

## 2011-04-17 NOTE — Assessment & Plan Note (Signed)
Reviewed health habits including diet and exercise and skin cancer prevention Also reviewed health mt list, fam hx and immunizations  Disc imp of wt loss and chol control Can consider zoster vaccine

## 2011-04-17 NOTE — Progress Notes (Signed)
Subjective:    Patient ID: Rachael Silva, female    DOB: 10-26-1945, 65 y.o.   MRN: [id number]  HPI Here for annual health mt exam and to rev chronic med problems  Nothing new going on medically  Got a big bruise from her blood draw   Wt is up 5 lb - obese  Eating has been fair - off a little  Not a lot of exercise  Too hot   Last gyn visit ? Last one was a few years ago - and was normal  No gyn problems at all   Zoster status - never had it  Unsure if she wants the vaccine   Mam 12/09-- thinks she has had one since then -- goes to [facility]  Self exam - no lumps or changes   Td 05 colonosc 12/09 normal  No bowel changes    bp good today 128/72-- has been labile in past  Chol up a bit with LDL 138 Diet Lab Results  Component Value Date   CHOL 205* 04/07/2011   CHOL 202* 09/26/2010   CHOL 226* 04/05/2010   Lab Results  Component Value Date   HDL 63.30 04/07/2011   HDL 40.98 09/26/2010   HDL 59.40 04/05/2010   Lab Results  Component Value Date   LDLCALC 92 05/10/2009   LDLCALC 55 06/04/2008   LDLCALC 82 05/17/2007   Lab Results  Component Value Date   TRIG 42.0 04/07/2011   TRIG 59.0 09/26/2010   TRIG 79.0 04/05/2010   Lab Results  Component Value Date   CHOLHDL 3 04/07/2011   CHOLHDL 3 09/26/2010   CHOLHDL 4 04/05/2010   Lab Results  Component Value Date   LDLDIRECT 138.9 04/07/2011   LDLDIRECT 123.4 09/26/2010   LDLDIRECT 139.1 04/05/2010     Hypothyroid- stable tsh Lab Results  Component Value Date   TSH 2.46 04/07/2011  feels the same- no change in skin or hair or energy level Feels fine   Legs still swell some with comp hose - would like stonger ones  Some pain  Swelling is worse in upper leg   Patient Active Problem List  Diagnoses  . HYPOTHYROIDISM  . HYPERCHOLESTEROLEMIA  . OBESITY  . SINUSITIS, CHRONIC  . ALLERGIC RHINITIS  . ASTHMA  . PERIODONTAL DISEASE  . GERD  . HOT FLASHES  . ARTHRITIS, RIGHT KNEE  . EDEMA  . MURMUR    . URINARY INCONTINENCE  . Other screening mammogram  . Routine general medical examination at a health care facility   Past Medical History  Diagnosis Date  . Allergy     allergic rhinitis  . Asthma   . Arthritis     OA of knees  . Hiatal hernia   . Hypothyroid   . Labile blood pressure   . Urinary incontinence   . Pneumonia, eosinophilic   . Angioedema 02/2006   Past Surgical History  Procedure Date  . Cesarean section     x2  . Breast surgery 1966    breast biopsy   History  Substance Use Topics  . Smoking status: Never Smoker   . Smokeless tobacco: Not on file  . Alcohol Use: No   Family History  Problem Relation Age of Onset  . Diabetes Mother     pre-diabetic  . Heart disease Mother     ? heart disease   Allergies  Allergen Reactions  . Erythromycin     REACTION: feels sick   Current Outpatient Prescriptions  on File Prior to Visit  Medication Sig Dispense Refill  . azelastine (ASTEPRO) 137 MCG/SPRAY nasal spray Place into the nose. Use in each nostril as directed       . fexofenadine (ALLEGRA ALLERGY) 60 MG tablet Take by mouth as directed.        . Fluticasone-Salmeterol (ADVAIR DISKUS) 250-50 MCG/DOSE AEPB Inhale into the lungs as directed.        . furosemide (LASIX) 20 MG tablet Take 1 tablet by mouth in the morning.  30 tablet  0  . ibuprofen (ADVIL,MOTRIN) 800 MG tablet Take 800 mg by mouth every 8 (eight) hours as needed. for joint pain take with food and stop if stomach upset.       Marland Kitchen levothyroxine (SYNTHROID) 50 MCG tablet Take 1 tablet (50 mcg total) by mouth daily.  30 tablet  6  . montelukast (SINGULAIR) 10 MG tablet Take 10 mg by mouth daily.        Marland Kitchen omeprazole (PRILOSEC) 20 MG capsule Take 20 mg by mouth daily.        Marland Kitchen albuterol (ACCUNEB) 0.63 MG/3ML nebulizer solution Take 1 ampule by nebulization every 6 (six) hours as needed.        Marland Kitchen albuterol (PROVENTIL,VENTOLIN) 90 MCG/ACT inhaler Inhale into the lungs as directed.        Marland Kitchen  aspirin-acetaminophen-caffeine (EXCEDRIN EXTRA STRENGTH) 250-250-65 MG per tablet Take by mouth as directed.              Review of Systems Review of Systems  Constitutional: Negative for fever, appetite change, fatigue and unexpected weight change.  Eyes: Negative for pain and visual disturbance.  Respiratory: Negative for cough and shortness of breath.  Wheezing has been better  Cardiovascular: Negative.  For cp or palp Gastrointestinal: Negative for nausea, diarrhea and constipation.  Genitourinary: Negative for urgency and frequency.  Skin: Negative for pallor. no rash  Neurological: Negative for weakness, light-headedness, numbness and headaches.  Hematological: Negative for adenopathy. Does not bruise/bleed easily.  Psychiatric/Behavioral: Negative for dysphoric mood. The patient is not nervous/anxious.          Objective:   Physical Exam  Constitutional: She appears well-developed and well-nourished. No distress.       overwt and well appearing   HENT:  Head: Normocephalic and atraumatic.  Right Ear: External ear normal.  Left Ear: External ear normal.  Nose: Nose normal.  Mouth/Throat: Oropharynx is clear and moist.       Nares are boggy and pale   Eyes: Conjunctivae and EOM are normal. Pupils are equal, round, and reactive to light.  Neck: Normal range of motion. Neck supple. No JVD present. Carotid bruit is not present. Erythema present. No thyromegaly present.  Cardiovascular: Normal rate, regular rhythm and intact distal pulses.   Pulmonary/Chest: Effort normal and breath sounds normal. No respiratory distress. She has no wheezes. She has no rales.  Abdominal: Soft. Bowel sounds are normal. She exhibits no distension and no mass. There is no tenderness.  Genitourinary: No breast swelling, tenderness, discharge or bleeding.  Musculoskeletal: She exhibits no edema.       Today no edema in legs - has been wearing hose Varicosities are mild to mod and compressible    Lymphadenopathy:    She has no cervical adenopathy.  Neurological: She is alert. She has normal reflexes. Coordination normal.  Skin: Skin is warm and dry. No rash noted. No erythema. No pallor.  Psychiatric: She has a normal mood and affect.  Assessment & Plan:

## 2011-04-17 NOTE — Assessment & Plan Note (Signed)
Stable clinically with nl tsh  No change in dose

## 2011-04-17 NOTE — Assessment & Plan Note (Signed)
This is up - with direct LDL in 130s Disc goals for this  Disc low sat fat diet  Will continue to follow

## 2011-04-17 NOTE — Assessment & Plan Note (Signed)
15-20 mm hg supp hose help with edema/ varicosities but still some swelling -primarily just above and below knee Wants to continue thigh highs but at higher compression- so inc to 20-30 mm hg and will update  Disc salt restriction and leg elevation when able - esp in the heat

## 2011-04-17 NOTE — Assessment & Plan Note (Signed)
Breast exam today  Schedule annual mammogram

## 2011-04-17 NOTE — Patient Instructions (Signed)
We will schedule mammogram with Shirlee Limerick at check out  Work on exercise 20 or more minutes 5 days per week for weight loss If you are interested in shingles vaccine in future - call your insurance company to see how coverage is and call us to schedule  Avoid red meat/ fried foods/ egg yolks/ fatty breakfast meats/ butter, cheese and high fat dairy/ and shellfish  (cholesterol was up mildly this check- we want to get that back down)

## 2011-05-03 ENCOUNTER — Other Ambulatory Visit: Payer: Self-pay | Admitting: Family Medicine

## 2011-05-03 NOTE — Telephone Encounter (Signed)
[facility] request refill electronically for Furosemide 20mg  #30 x5.

## 2011-05-29 ENCOUNTER — Ambulatory Visit: Payer: Self-pay | Admitting: Family Medicine

## 2011-05-31 ENCOUNTER — Encounter: Payer: Self-pay | Admitting: Family Medicine

## 2011-06-02 ENCOUNTER — Ambulatory Visit: Payer: Self-pay | Admitting: Family Medicine

## 2011-06-06 ENCOUNTER — Telehealth: Payer: Self-pay

## 2011-06-06 ENCOUNTER — Encounter: Payer: Self-pay | Admitting: Family Medicine

## 2011-06-06 DIAGNOSIS — R920 Mammographic microcalcification found on diagnostic imaging of breast: Secondary | ICD-10-CM | POA: Insufficient documentation

## 2011-06-06 HISTORY — DX: Mammographic microcalcification found on diagnostic imaging of breast: R92.0

## 2011-06-06 NOTE — Telephone Encounter (Signed)
Patient notified as instructed by telephone. Pt is agreeable to go to surgeon in [place]. Pt will wait to hear from pt care coordinator and can be reached at [phone].

## 2011-06-06 NOTE — Telephone Encounter (Signed)
Message copied by Patience Musca on Tue Jun 06, 2011  9:00 AM ------      Message from: Roxy Manns A      Created: Mon Jun 05, 2011  5:26 PM       Thank you - please let pt know that her mammogram re check saw calcifications and the radiologist recommends a biopsy - I want to refer her to general surgeon for this - please see if agreeable and if she pref [place] or [employer]       I put mam in IN box

## 2011-06-06 NOTE — Progress Notes (Signed)
Please see telephone note. Patient notified as instructed by telephone. Pt is agreeable to see surgeon in [place] and will wait to hear from pt care coordinator. Pt can be reached at [phone].

## 2011-06-22 ENCOUNTER — Telehealth: Payer: Self-pay | Admitting: Family Medicine

## 2011-06-22 ENCOUNTER — Telehealth: Payer: Self-pay

## 2011-06-22 NOTE — Telephone Encounter (Signed)
Rachael Silva said pt walked in and asked to speak with me. Pt said she went to appt this AM at Dr Luan Moore office and pt did not stay for appt because she felt Dr Evette Cristal was too busy to see her. Pt would like to see a lady surgeon if possible. I explained that Shirlee Limerick our pt care coordinator would schedule the appt for her and I spoke with Shirlee Limerick and she was going to talk with pt. who is waiting in the waiting room.

## 2011-06-22 NOTE — Telephone Encounter (Signed)
Thanks for doing that - let me know if I need to do anything

## 2011-06-22 NOTE — Telephone Encounter (Signed)
Patient was scheduled for Surgical Consult today, 06/22/2011 with Dr Evette Cristal at 9:45am. Patient went to the appt and arrived early for the consult. The appt time was 10:15am. Patient left their office without being seen and walked into our office and told Rena that they were too busy to see her today and wanted to be referred to someone else. I got her a new consult appt with [facility] Dr Renda Rolls on 07/07/2011 at 3:00pm. Records will be faxed to them and pt  was here when we scheduled this consult.

## 2011-07-10 ENCOUNTER — Encounter: Payer: Self-pay | Admitting: Family Medicine

## 2011-07-10 ENCOUNTER — Ambulatory Visit (INDEPENDENT_AMBULATORY_CARE_PROVIDER_SITE_OTHER): Payer: BC Managed Care – PPO | Admitting: Family Medicine

## 2011-07-10 VITALS — BP 130/80 | HR 80 | Temp 98.0°F | Wt 264.0 lb

## 2011-07-10 DIAGNOSIS — J40 Bronchitis, not specified as acute or chronic: Secondary | ICD-10-CM

## 2011-07-10 MED ORDER — GUAIFENESIN-CODEINE 100-10 MG/5ML PO SYRP: 5.0000 mL | ORAL_SOLUTION | Freq: Three times a day (TID) | ORAL | Status: DC | PRN

## 2011-07-10 MED ORDER — AMOXICILLIN-POT CLAVULANATE 875-125 MG PO TABS: 1.0000 | ORAL_TABLET | Freq: Two times a day (BID) | ORAL | Status: AC

## 2011-07-10 NOTE — Progress Notes (Signed)
Subjective:    Patient ID: Rachael Silva, female    DOB: Feb 08, 1946, 65 y.o.   MRN: [id number]  HPI Has had congestion on and off for 2 mo  Throat sore from cough and chest is getting sore from it too  Colored mucous -- brown and green  No wheezing , however - does use rescue inhaler for tightness occas  No fever that she knows of  Tends to have sweats baseline   Some sinus headache - not a lot  Congestion is not severe   No meds otc   Patient Active Problem List  Diagnoses  . HYPOTHYROIDISM  . HYPERCHOLESTEROLEMIA  . OBESITY  . SINUSITIS, CHRONIC  . ALLERGIC RHINITIS  . ASTHMA  . PERIODONTAL DISEASE  . GERD  . HOT FLASHES  . ARTHRITIS, RIGHT KNEE  . EDEMA  . MURMUR  . URINARY INCONTINENCE  . Other screening mammogram  . Routine general medical examination at a health care facility  . Abnormal mammogram with microcalcification  . Bronchitis   Past Medical History  Diagnosis Date  . Allergy     allergic rhinitis  . Asthma   . Arthritis     OA of knees  . Hiatal hernia   . Hypothyroid   . Labile blood pressure   . Urinary incontinence   . Pneumonia, eosinophilic   . Angioedema 02/2006   Past Surgical History  Procedure Date  . Cesarean section     x2  . Breast surgery 1966    breast biopsy   History  Substance Use Topics  . Smoking status: Never Smoker   . Smokeless tobacco: Not on file  . Alcohol Use: No   Family History  Problem Relation Age of Onset  . Diabetes Mother     pre-diabetic  . Heart disease Mother     ? heart disease   Allergies  Allergen Reactions  . Erythromycin     REACTION: feels sick   Current Outpatient Prescriptions on File Prior to Visit  Medication Sig Dispense Refill  . albuterol (ACCUNEB) 0.63 MG/3ML nebulizer solution Take 1 ampule by nebulization every 6 (six) hours as needed.        Marland Kitchen albuterol (PROVENTIL,VENTOLIN) 90 MCG/ACT inhaler Inhale into the lungs as directed.        . Aspirin-Salicylamide-Caffeine (BC  HEADACHE POWDER PO) Take by mouth as needed.        Marland Kitchen azelastine (ASTEPRO) 137 MCG/SPRAY nasal spray Place into the nose. Use in each nostril as directed       . fexofenadine (ALLEGRA ALLERGY) 60 MG tablet Take by mouth as directed.        . Fluticasone-Salmeterol (ADVAIR DISKUS) 250-50 MCG/DOSE AEPB Inhale into the lungs as directed.        . furosemide (LASIX) 20 MG tablet TAKE 1 TABLET BY MOUTH IN THE MORNING.  30 tablet  5  . ibuprofen (ADVIL,MOTRIN) 800 MG tablet Take 800 mg by mouth every 8 (eight) hours as needed. for joint pain take with food and stop if stomach upset.       Marland Kitchen levothyroxine (SYNTHROID) 50 MCG tablet Take 1 tablet (50 mcg total) by mouth daily.  30 tablet  6  . montelukast (SINGULAIR) 10 MG tablet Take 10 mg by mouth daily.        Marland Kitchen omeprazole (PRILOSEC) 20 MG capsule Take 20 mg by mouth daily.        Marland Kitchen aspirin-acetaminophen-caffeine (EXCEDRIN EXTRA STRENGTH) [phone] MG per tablet Take by  mouth as directed.            Review of Systems Review of Systems  Constitutional: Negative for fever, appetite change, fatigue and unexpected weight change.  Eyes: Negative for pain and visual disturbance ENT pos for st and congestion , no ear pain .  Respiratory: Negative for sob or wheeze/ pos for prod cough and chest soreness Cardiovascular: Negative for cp or palpitations    Gastrointestinal: Negative for nausea, diarrhea and constipation.  Genitourinary: Negative for urgency and frequency.  Skin: Negative for pallor or rash   Neurological: Negative for weakness, light-headedness, numbness and headaches.  Hematological: Negative for adenopathy. Does not bruise/bleed easily.  Psychiatric/Behavioral: Negative for dysphoric mood. The patient is not nervous/anxious.          Objective:   Physical Exam  Constitutional: She appears well-developed and well-nourished. No distress.       overwt and well appearing   HENT:  Head: Normocephalic and atraumatic.  Right Ear:  External ear normal.  Left Ear: External ear normal.  Mouth/Throat: Oropharynx is clear and moist.       Nares are congested Some maxillary sinus tenderness  Eyes: Conjunctivae and EOM are normal. Pupils are equal, round, and reactive to light. Right eye exhibits no discharge. Left eye exhibits no discharge.  Neck: Normal range of motion. Neck supple. No thyromegaly present.  Cardiovascular: Normal rate, regular rhythm and normal heart sounds.   Pulmonary/Chest: Effort normal and breath sounds normal. No respiratory distress. She has no rales.       Harsh bs throughout  Wheeze is mild on forced exp only  No rales or rhonchi  Neurological: She is alert.  Skin: Skin is warm and dry. No rash noted. No erythema. No pallor.  Psychiatric: She has a normal mood and affect.          Assessment & Plan:

## 2011-07-10 NOTE — Patient Instructions (Signed)
Take the augmentin for bronchitis (and possibly early sinus infection) Drink lots of water Try cough medicine first at night since it can sedate If worse or not starting to improve in 1 week please let me know

## 2011-07-13 NOTE — Assessment & Plan Note (Signed)
Poss with early sinusitis Asthmatic pt with only mild reactive airways presently Cover with augmentin sympt care Update if worse or not imp

## 2011-07-27 ENCOUNTER — Ambulatory Visit (INDEPENDENT_AMBULATORY_CARE_PROVIDER_SITE_OTHER): Payer: BC Managed Care – PPO

## 2011-07-27 DIAGNOSIS — Z23 Encounter for immunization: Secondary | ICD-10-CM

## 2011-08-08 ENCOUNTER — Other Ambulatory Visit: Payer: Self-pay | Admitting: *Deleted

## 2011-08-08 MED ORDER — LEVOTHYROXINE SODIUM 50 MCG PO TABS: 50.0000 ug | ORAL_TABLET | Freq: Every day | ORAL | Status: DC

## 2011-10-10 HISTORY — PX: BREAST BIOPSY: SHX20

## 2011-10-24 ENCOUNTER — Encounter: Payer: Self-pay | Admitting: Family Medicine

## 2011-10-24 ENCOUNTER — Ambulatory Visit (INDEPENDENT_AMBULATORY_CARE_PROVIDER_SITE_OTHER): Payer: BC Managed Care – PPO | Admitting: Family Medicine

## 2011-10-24 VITALS — BP 124/70 | HR 96 | Temp 98.1°F | Resp 24 | Ht 66.75 in | Wt 268.5 lb

## 2011-10-24 DIAGNOSIS — J40 Bronchitis, not specified as acute or chronic: Secondary | ICD-10-CM

## 2011-10-24 MED ORDER — GUAIFENESIN-CODEINE 100-10 MG/5ML PO SYRP: 5.0000 mL | ORAL_SOLUTION | Freq: Three times a day (TID) | ORAL | Status: DC | PRN

## 2011-10-24 MED ORDER — ALBUTEROL SULFATE HFA 108 (90 BASE) MCG/ACT IN AERS: 2.0000 | INHALATION_SPRAY | RESPIRATORY_TRACT | Status: DC | PRN

## 2011-10-24 MED ORDER — AZITHROMYCIN 250 MG PO TABS: ORAL_TABLET | ORAL | Status: AC

## 2011-10-24 NOTE — Patient Instructions (Signed)
Drink lots of water and get rest  Take the zithromax pack for bronchitis  Use your rescue inhaler when needed  Use cough medicine with caution If wheezing worsens - call  Update if not starting to improve in a week or if worsening in general  I sent the zpak to the pharmacy Here is the px for the cough medicine to take to the pharmacy

## 2011-10-24 NOTE — Progress Notes (Signed)
Subjective:    Patient ID: Rachael Silva, female    DOB: [birth year], 66 y.o.   MRN: [id number]  HPI Has been sick for a while now  Last took abx (augmentin for sinus infx) in oct  Within several weeks of stopping it - started back with symptoms   Has waited it out  Prod cough- green mucous  Little wheeze - not bad No fever  Had her flu shot   Sinuses were full- started back doing saline rinse and had some improvement  Cough and chest soreness are more of the issue  Uses her albuterol mdi when needed for rescue   Patient Active Problem List  Diagnoses  . HYPOTHYROIDISM  . HYPERCHOLESTEROLEMIA  . OBESITY  . SINUSITIS, CHRONIC  . ALLERGIC RHINITIS  . ASTHMA  . PERIODONTAL DISEASE  . GERD  . HOT FLASHES  . ARTHRITIS, RIGHT KNEE  . EDEMA  . MURMUR  . URINARY INCONTINENCE  . Other screening mammogram  . Routine general medical examination at a health care facility  . Abnormal mammogram with microcalcification  . Bronchitis   Past Medical History  Diagnosis Date  . Allergy     allergic rhinitis  . Asthma   . Arthritis     OA of knees  . Hiatal hernia   . Hypothyroid   . Labile blood pressure   . Urinary incontinence   . Pneumonia, eosinophilic   . Angioedema 02/2006   Past Surgical History  Procedure Date  . Cesarean section     x2  . Breast surgery 1966    breast biopsy   History  Substance Use Topics  . Smoking status: Never Smoker   . Smokeless tobacco: Not on file  . Alcohol Use: No   Family History  Problem Relation Age of Onset  . Diabetes Mother     pre-diabetic  . Heart disease Mother     ? heart disease   Allergies  Allergen Reactions  . Erythromycin     REACTION: feels sick   Current Outpatient Prescriptions on File Prior to Visit  Medication Sig Dispense Refill  . Aspirin-Salicylamide-Caffeine (BC HEADACHE POWDER PO) Take by mouth as needed.        Marland Kitchen azelastine (ASTEPRO) 137 MCG/SPRAY nasal spray Place into the nose. Use in each  nostril as directed       . fexofenadine (ALLEGRA ALLERGY) 60 MG tablet Take by mouth as directed.        . Fluticasone-Salmeterol (ADVAIR DISKUS) 250-50 MCG/DOSE AEPB Inhale into the lungs as directed.        . furosemide (LASIX) 20 MG tablet TAKE 1 TABLET BY MOUTH IN THE MORNING.  30 tablet  5  . ibuprofen (ADVIL,MOTRIN) 800 MG tablet Take 800 mg by mouth every 8 (eight) hours as needed. for joint pain take with food and stop if stomach upset.       Marland Kitchen levothyroxine (SYNTHROID) 50 MCG tablet Take 1 tablet (50 mcg total) by mouth daily.  30 tablet  8  . montelukast (SINGULAIR) 10 MG tablet Take 10 mg by mouth daily.        Marland Kitchen omeprazole (PRILOSEC) 20 MG capsule Take 20 mg by mouth daily.        Marland Kitchen albuterol (ACCUNEB) 0.63 MG/3ML nebulizer solution Take 1 ampule by nebulization every 6 (six) hours as needed.        Marland Kitchen aspirin-acetaminophen-caffeine (EXCEDRIN EXTRA STRENGTH) 250-250-65 MG per tablet Take by mouth as directed.  Review of Systems Review of Systems  Constitutional: Negative for fever, appetite change, fatigue and unexpected weight change.  Eyes: Negative for pain and visual disturbance.  ENT pos for runny nose, neg for sinus tenderness Respiratory: Negative for sob  Cardiovascular: Negative for cp or palpitations    Gastrointestinal: Negative for nausea, diarrhea and constipation.  Genitourinary: Negative for urgency and frequency.  Skin: Negative for pallor or rash   Neurological: Negative for weakness, light-headedness, numbness and headaches.  Hematological: Negative for adenopathy. Does not bruise/bleed easily.  Psychiatric/Behavioral: Negative for dysphoric mood. The patient is not nervous/anxious.          Objective:   Physical Exam  Constitutional: She appears well-developed and well-nourished. No distress.  HENT:  Head: Normocephalic and atraumatic.  Right Ear: External ear normal.  Left Ear: External ear normal.  Mouth/Throat: Oropharynx is clear and  moist. No oropharyngeal exudate.       Nares are boggy with clear rhinorrhea No sinus tenderness   Eyes: Conjunctivae and EOM are normal. Pupils are equal, round, and reactive to light. Right eye exhibits no discharge. Left eye exhibits no discharge.  Neck: Normal range of motion. Neck supple.  Cardiovascular: Normal rate, regular rhythm and normal heart sounds.   Pulmonary/Chest: Effort normal. No respiratory distress. She has wheezes. She has no rales. She exhibits no tenderness.       Generally harsh bs throughout  Scant occas rhonchi No wheeze unless forced exp Not sob  No rales or crackles  Lymphadenopathy:    She has no cervical adenopathy.  Neurological: She is alert. She has normal reflexes.  Skin: Skin is warm and dry. No rash noted.  Psychiatric: She has a normal mood and affect.          Assessment & Plan:

## 2011-10-24 NOTE — Assessment & Plan Note (Signed)
With prod cough purulent sputum - wheeze at home controlled by asthma med but no wheeze here today  Nl pulse ox and reassuring exam Will cover with zpak Robitussin with codeine cough med prn  If not imp will update Pt is worried she may need prednisone if not improved

## 2011-11-07 ENCOUNTER — Ambulatory Visit (INDEPENDENT_AMBULATORY_CARE_PROVIDER_SITE_OTHER): Payer: BC Managed Care – PPO | Admitting: Family Medicine

## 2011-11-07 ENCOUNTER — Encounter: Payer: Self-pay | Admitting: Family Medicine

## 2011-11-07 VITALS — BP 138/72 | HR 84 | Temp 98.2°F | Ht 66.75 in | Wt 264.5 lb

## 2011-11-07 DIAGNOSIS — J40 Bronchitis, not specified as acute or chronic: Secondary | ICD-10-CM

## 2011-11-07 MED ORDER — GUAIFENESIN-CODEINE 100-10 MG/5ML PO SYRP: 5.0000 mL | ORAL_SOLUTION | Freq: Three times a day (TID) | ORAL | Status: AC | PRN

## 2011-11-07 MED ORDER — AZITHROMYCIN 250 MG PO TABS: ORAL_TABLET | ORAL | Status: AC

## 2011-11-07 NOTE — Assessment & Plan Note (Signed)
Here for follow up - exam is reassuring  Due to length of illness and some imp on zpak- will repeat that  Also refilled robitussin ac for night time - may need to stop the cough cycle Thankfully reactive airways are staying quiet  Disc symptomatic care - see instructions on AVS  Update if not starting to improve in a week or if worsening  -- especially if fever/worse cough/sob or wheezing

## 2011-11-07 NOTE — Progress Notes (Signed)
Subjective:    Patient ID: Rachael Silva, female    DOB: 1946-07-12, 66 y.o.   MRN: [id number]  HPI Not getting much better since last time  Was better on the zpak  Now a little worse again - still coughing -- almost back to square one  Productive cough- yellow and green sputum  Is sore in the chest - to cough mainly (not to deep breath)  Cough syrup did help some - but it made her feel really drowsy- so took at night  No fever   Not really wheezing   Some nasal symptoms- and the sinus saline rinses helped   Patient Active Problem List  Diagnoses  . HYPOTHYROIDISM  . HYPERCHOLESTEROLEMIA  . OBESITY  . SINUSITIS, CHRONIC  . ALLERGIC RHINITIS  . ASTHMA  . PERIODONTAL DISEASE  . GERD  . HOT FLASHES  . ARTHRITIS, RIGHT KNEE  . EDEMA  . MURMUR  . URINARY INCONTINENCE  . Other screening mammogram  . Routine general medical examination at a health care facility  . Abnormal mammogram with microcalcification  . Bronchitis   Past Medical History  Diagnosis Date  . Allergy     allergic rhinitis  . Asthma   . Arthritis     OA of knees  . Hiatal hernia   . Hypothyroid   . Labile blood pressure   . Urinary incontinence   . Pneumonia, eosinophilic   . Angioedema 02/2006   Past Surgical History  Procedure Date  . Cesarean section     x2  . Breast surgery 1966    breast biopsy   History  Substance Use Topics  . Smoking status: Never Smoker   . Smokeless tobacco: Not on file  . Alcohol Use: No   Family History  Problem Relation Age of Onset  . Diabetes Mother     pre-diabetic  . Heart disease Mother     ? heart disease   Allergies  Allergen Reactions  . Erythromycin     REACTION: feels sick   Current Outpatient Prescriptions on File Prior to Visit  Medication Sig Dispense Refill  . albuterol (PROVENTIL HFA;VENTOLIN HFA) 108 (90 BASE) MCG/ACT inhaler Inhale 2 puffs into the lungs every 4 (four) hours as needed for wheezing.  1 Inhaler  5  .  Aspirin-Salicylamide-Caffeine (BC HEADACHE POWDER PO) Take by mouth as needed.        Marland Kitchen azelastine (ASTEPRO) 137 MCG/SPRAY nasal spray Place into the nose. Use in each nostril as directed       . fexofenadine (ALLEGRA ALLERGY) 60 MG tablet Take by mouth as directed.        . Fluticasone-Salmeterol (ADVAIR DISKUS) 250-50 MCG/DOSE AEPB Inhale into the lungs as directed.        . furosemide (LASIX) 20 MG tablet TAKE 1 TABLET BY MOUTH IN THE MORNING.  30 tablet  5  . ibuprofen (ADVIL,MOTRIN) 800 MG tablet Take 800 mg by mouth every 8 (eight) hours as needed. for joint pain take with food and stop if stomach upset.       Marland Kitchen levothyroxine (SYNTHROID) 50 MCG tablet Take 1 tablet (50 mcg total) by mouth daily.  30 tablet  8  . montelukast (SINGULAIR) 10 MG tablet Take 10 mg by mouth daily.        Marland Kitchen omeprazole (PRILOSEC) 20 MG capsule Take 20 mg by mouth daily.        Marland Kitchen albuterol (ACCUNEB) 0.63 MG/3ML nebulizer solution Take 1 ampule by nebulization every  6 (six) hours as needed.        Marland Kitchen aspirin-acetaminophen-caffeine (EXCEDRIN EXTRA STRENGTH) 250-250-65 MG per tablet Take by mouth as directed.              Review of Systems Review of Systems  Constitutional: Negative for fever, appetite change, and unexpected weight change. pos for fatigue  Eyes: Negative for pain and visual disturbance.  ENT pos for congestion and drainage/ neg for sinus pain  Respiratory: Negative for wheeze and sob , pos for cough  Cardiovascular: Negative for cp or palpitations    Gastrointestinal: Negative for nausea, diarrhea and constipation.  Genitourinary: Negative for urgency and frequency.  Skin: Negative for pallor or rash   Neurological: Negative for weakness, light-headedness, numbness and headaches.  Hematological: Negative for adenopathy. Does not bruise/bleed easily.  Psychiatric/Behavioral: Negative for dysphoric mood. The patient is not nervous/anxious.          Objective:   Physical Exam    Constitutional: She appears well-developed and well-nourished. No distress.       overwt and well appearing   HENT:  Head: Normocephalic and atraumatic.  Right Ear: External ear normal.  Left Ear: External ear normal.  Mouth/Throat: Oropharynx is clear and moist.       Nares are injected and congested  No sinus tenderness Throat- post nasal clear drainage No erythema   Eyes: Conjunctivae and EOM are normal. Pupils are equal, round, and reactive to light. Right eye exhibits no discharge. Left eye exhibits no discharge.  Neck: Normal range of motion. Neck supple.  Cardiovascular: Normal rate, regular rhythm and normal heart sounds.   Pulmonary/Chest: Effort normal and breath sounds normal. No respiratory distress. She has no rales. She exhibits tenderness.       Mild anterior chest tenderness No rales or rhonchi  Scant wheeze on forced exp only  No prolonged exp phase  Good air exch  Lymphadenopathy:    She has no cervical adenopathy.  Skin: Skin is warm and dry. No rash noted.  Psychiatric: She has a normal mood and affect.          Assessment & Plan:

## 2011-11-07 NOTE — Patient Instructions (Signed)
I think you are making slow progress with your bronchitis - but it may take more time for cough to get better  Repeat the zithromax course- I sent it to your pharmacy  Take the px cough medicine for night time  Take robitussin DM during the day since it does not sedate as much  Try to get some rest Keep drinking fluids  If wheezing worsens -- please let me know  Update if not starting to improve in a week or if worsening

## 2011-11-16 ENCOUNTER — Other Ambulatory Visit: Payer: Self-pay

## 2011-11-16 MED ORDER — FUROSEMIDE 20 MG PO TABS: 20.0000 mg | ORAL_TABLET | Freq: Every day | ORAL | Status: DC

## 2011-11-16 NOTE — Telephone Encounter (Signed)
[facility] request refill furosemide 20 mg #30 x 5.

## 2011-11-28 ENCOUNTER — Other Ambulatory Visit: Payer: Self-pay | Admitting: *Deleted

## 2011-11-28 MED ORDER — IBUPROFEN 800 MG PO TABS: 800.0000 mg | ORAL_TABLET | Freq: Three times a day (TID) | ORAL | Status: DC | PRN

## 2011-11-28 NOTE — Telephone Encounter (Signed)
Will refill electronically  

## 2011-11-28 NOTE — Telephone Encounter (Signed)
Okay to refill? 

## 2011-12-04 ENCOUNTER — Ambulatory Visit: Payer: Self-pay | Admitting: Surgery

## 2011-12-25 ENCOUNTER — Ambulatory Visit: Payer: Self-pay | Admitting: Surgery

## 2011-12-27 LAB — PATHOLOGY REPORT

## 2012-05-24 ENCOUNTER — Ambulatory Visit (INDEPENDENT_AMBULATORY_CARE_PROVIDER_SITE_OTHER): Payer: BC Managed Care – PPO | Admitting: Family Medicine

## 2012-05-24 ENCOUNTER — Encounter: Payer: Self-pay | Admitting: Family Medicine

## 2012-05-24 VITALS — BP 122/68 | HR 67 | Temp 98.2°F | Ht 68.0 in | Wt 261.5 lb

## 2012-05-24 DIAGNOSIS — S8011XA Contusion of right lower leg, initial encounter: Secondary | ICD-10-CM

## 2012-05-24 DIAGNOSIS — S8010XA Contusion of unspecified lower leg, initial encounter: Secondary | ICD-10-CM

## 2012-05-24 MED ORDER — MELOXICAM 7.5 MG PO TABS: 7.5000 mg | ORAL_TABLET | Freq: Every day | ORAL | Status: DC

## 2012-05-24 NOTE — Patient Instructions (Addendum)
Please send for urgent care records from [facility] Elevate leg and use cold compress whenever possible  Try the mobic instead of ibuprofen or other over the counter medicines  If not improving more in 7-10 days - please call and let me know  Make appt for PE before the end of the year (any 30 min visit)- with labs prior

## 2012-05-24 NOTE — Progress Notes (Signed)
Subjective:    Patient ID: Rachael Silva, female    DOB: 1946/07/17, 66 y.o.   MRN: [id number]  HPI Here after a fall  Stood up from a chair - slipped and fell - was over a week ago and she is still in pain on R leg --bruising Went to urgent care - thinks she had xrays at [facility] - she did not get much info from that   Is swollen  Is bruised from the knee down  Hurts worst right in front of the knee- down just a bit  Is tender to the touch No broken skin   Walking - hard - is sore   Took ibuprofen otc for her pain  Not bothering her stomach Makes her sleepy- so does not take itoften  Patient Active Problem List  Diagnosis  . HYPOTHYROIDISM  . HYPERCHOLESTEROLEMIA  . OBESITY  . SINUSITIS, CHRONIC  . ALLERGIC RHINITIS  . ASTHMA  . PERIODONTAL DISEASE  . GERD  . HOT FLASHES  . ARTHRITIS, RIGHT KNEE  . EDEMA  . MURMUR  . URINARY INCONTINENCE  . Other screening mammogram  . Routine general medical examination at a health care facility  . Abnormal mammogram with microcalcification  . Bronchitis   Past Medical History  Diagnosis Date  . Allergy     allergic rhinitis  . Asthma   . Arthritis     OA of knees  . Hiatal hernia   . Hypothyroid   . Labile blood pressure   . Urinary incontinence   . Pneumonia, eosinophilic   . Angioedema 02/2006   Past Surgical History  Procedure Date  . Cesarean section     x2  . Breast surgery 1966    breast biopsy   History  Substance Use Topics  . Smoking status: Never Smoker   . Smokeless tobacco: Not on file  . Alcohol Use: No   Family History  Problem Relation Age of Onset  . Diabetes Mother     pre-diabetic  . Heart disease Mother     ? heart disease   Allergies  Allergen Reactions  . Erythromycin     REACTION: feels sick   Current Outpatient Prescriptions on File Prior to Visit  Medication Sig Dispense Refill  . albuterol (ACCUNEB) 0.63 MG/3ML nebulizer solution Take 1 ampule by nebulization every 6 (six)  hours as needed.        Marland Kitchen albuterol (PROVENTIL HFA;VENTOLIN HFA) 108 (90 BASE) MCG/ACT inhaler Inhale 2 puffs into the lungs every 4 (four) hours as needed for wheezing.  1 Inhaler  5  . aspirin-acetaminophen-caffeine (EXCEDRIN EXTRA STRENGTH) 250-250-65 MG per tablet Take by mouth as directed.        . Aspirin-Salicylamide-Caffeine (BC HEADACHE POWDER PO) Take by mouth as needed.        . fexofenadine (ALLEGRA ALLERGY) 60 MG tablet Take by mouth as directed.        . Fluticasone-Salmeterol (ADVAIR DISKUS) 250-50 MCG/DOSE AEPB Inhale into the lungs as directed.        . furosemide (LASIX) 20 MG tablet Take 1 tablet (20 mg total) by mouth daily.  30 tablet  5  . ibuprofen (ADVIL,MOTRIN) 800 MG tablet Take 1 tablet (800 mg total) by mouth every 8 (eight) hours as needed. for joint pain take with food and stop if stomach upset.  30 tablet  5  . levothyroxine (SYNTHROID) 50 MCG tablet Take 1 tablet (50 mcg total) by mouth daily.  30 tablet  8  .  montelukast (SINGULAIR) 10 MG tablet Take 10 mg by mouth daily.        Marland Kitchen omeprazole (PRILOSEC) 20 MG capsule Take 20 mg by mouth daily.        Marland Kitchen azelastine (ASTEPRO) 137 MCG/SPRAY nasal spray Place into the nose. Use in each nostril as directed       . guaiFENesin-codeine (ROBITUSSIN AC) 100-10 MG/5ML syrup Take 5 mLs by mouth 3 (three) times daily as needed for cough (may cause sedation- use caution).  120 mL  1      Review of Systems Review of Systems  Constitutional: Negative for fever, appetite change, fatigue and unexpected weight change.  Eyes: Negative for pain and visual disturbance.  Respiratory: Negative for cough and shortness of breath.   Cardiovascular: Negative for cp or palpitations    Gastrointestinal: Negative for nausea, diarrhea and constipation.  Genitourinary: Negative for urgency and frequency.  Skin: Negative for pallor or rash   MSK pos for pain and bruising/ swelling R leg below the knee Neurological: Negative for weakness,  light-headedness, numbness and headaches.  Hematological: Negative for adenopathy. Does not bruise/bleed easily.  Psychiatric/Behavioral: Negative for dysphoric mood. The patient is not nervous/anxious.         Objective:   Physical Exam  Constitutional: She appears well-developed and well-nourished. No distress.       obese and well appearing   HENT:  Head: Normocephalic and atraumatic.  Eyes: Conjunctivae and EOM are normal. Pupils are equal, round, and reactive to light.  Neck: Normal range of motion. Neck supple.  Cardiovascular: Normal rate and regular rhythm.   Pulmonary/Chest: Breath sounds normal.  Musculoskeletal: She exhibits edema and tenderness.       R leg has diffuse resolving ecchymosis from just below knee to ankle - patchy areas  Hematoma noted - just under knee- tender to the touch and mildly warm  No palp cords Neg homan sign Nl rom knee and ankle No joint line tenderness Does favor L leg   Neurological: She is alert. She has normal strength and normal reflexes. She displays no atrophy. No sensory deficit. She exhibits normal muscle tone. Coordination normal.  Skin: Skin is warm and dry. No rash noted. No pallor.  Psychiatric: She has a normal mood and affect.          Assessment & Plan:

## 2012-05-26 NOTE — Assessment & Plan Note (Signed)
After a fall - landing on lower leg Impressive hematoma on lower leg without evidence of compartment syndrome and without open skin  This looks to be resolving Reassuring joint exam Will send for UC records/ xray from [facility] Recommend relative rest/ elevation/ and cold compresses often Update if not starting to improve in a week or if worsening

## 2012-06-03 ENCOUNTER — Other Ambulatory Visit: Payer: Self-pay | Admitting: Family Medicine

## 2012-06-03 ENCOUNTER — Other Ambulatory Visit: Payer: Self-pay | Admitting: *Deleted

## 2012-06-03 MED ORDER — LEVOTHYROXINE SODIUM 50 MCG PO TABS: 50.0000 ug | ORAL_TABLET | Freq: Every day | ORAL | Status: DC

## 2012-06-03 NOTE — Telephone Encounter (Signed)
OK to refill? Last labs over 1 year (6/12)

## 2012-06-03 NOTE — Telephone Encounter (Signed)
She has labs and visit next month  Please refil the med for a month-thanks

## 2012-06-24 ENCOUNTER — Telehealth: Payer: Self-pay | Admitting: Family Medicine

## 2012-06-24 DIAGNOSIS — E7800 Pure hypercholesterolemia, unspecified: Secondary | ICD-10-CM

## 2012-06-24 DIAGNOSIS — Z0000 Encounter for general adult medical examination without abnormal findings: Secondary | ICD-10-CM

## 2012-06-24 NOTE — Telephone Encounter (Signed)
Message copied by Judy Pimple on Mon Jun 24, 2012  8:43 PM ------      Message from: Baldomero Lamy      Created: Tue Jun 18, 2012 12:42 PM      Regarding: cpx labs Tues 9/17       Please order  future cpx labs for pt's upcomming lab appt.      Thanks      Rodney Booze

## 2012-06-25 ENCOUNTER — Other Ambulatory Visit: Payer: BC Managed Care – PPO

## 2012-07-02 ENCOUNTER — Encounter: Payer: BC Managed Care – PPO | Admitting: Family Medicine

## 2012-07-09 ENCOUNTER — Other Ambulatory Visit: Payer: Self-pay | Admitting: Family Medicine

## 2012-08-19 ENCOUNTER — Ambulatory Visit (INDEPENDENT_AMBULATORY_CARE_PROVIDER_SITE_OTHER): Payer: BC Managed Care – PPO

## 2012-08-19 DIAGNOSIS — Z23 Encounter for immunization: Secondary | ICD-10-CM

## 2012-11-18 ENCOUNTER — Other Ambulatory Visit (INDEPENDENT_AMBULATORY_CARE_PROVIDER_SITE_OTHER): Payer: BC Managed Care – PPO

## 2012-11-18 DIAGNOSIS — Z0000 Encounter for general adult medical examination without abnormal findings: Secondary | ICD-10-CM

## 2012-11-18 DIAGNOSIS — E7800 Pure hypercholesterolemia, unspecified: Secondary | ICD-10-CM

## 2012-11-18 LAB — COMPREHENSIVE METABOLIC PANEL
AST: 17 U/L (ref 0–37)
Albumin: 3.5 g/dL (ref 3.5–5.2)
Alkaline Phosphatase: 101 U/L (ref 39–117)
BUN: 16 mg/dL (ref 6–23)
Potassium: 3.8 mEq/L (ref 3.5–5.1)
Sodium: 137 mEq/L (ref 135–145)
Total Protein: 7.2 g/dL (ref 6.0–8.3)

## 2012-11-18 LAB — CBC WITH DIFFERENTIAL/PLATELET
Basophils Relative: 0.7 % (ref 0.0–3.0)
Eosinophils Absolute: 1.8 10*3/uL — ABNORMAL HIGH (ref 0.0–0.7)
MCHC: 33.4 g/dL (ref 30.0–36.0)
MCV: 88.3 fl (ref 78.0–100.0)
Monocytes Absolute: 0.5 10*3/uL (ref 0.1–1.0)
Neutrophils Relative %: 32 % — ABNORMAL LOW (ref 43.0–77.0)
RBC: 4.48 Mil/uL (ref 3.87–5.11)

## 2012-11-18 LAB — LIPID PANEL
Total CHOL/HDL Ratio: 3
Triglycerides: 43 mg/dL (ref 0.0–149.0)

## 2012-11-18 LAB — TSH: TSH: 3.5 u[IU]/mL (ref 0.35–5.50)

## 2012-11-25 ENCOUNTER — Ambulatory Visit (INDEPENDENT_AMBULATORY_CARE_PROVIDER_SITE_OTHER): Payer: BC Managed Care – PPO | Admitting: Family Medicine

## 2012-11-25 ENCOUNTER — Encounter: Payer: Self-pay | Admitting: Family Medicine

## 2012-11-25 VITALS — BP 130/74 | HR 71 | Temp 97.7°F | Ht 66.75 in | Wt 236.5 lb

## 2012-11-25 DIAGNOSIS — E7800 Pure hypercholesterolemia, unspecified: Secondary | ICD-10-CM

## 2012-11-25 DIAGNOSIS — Z0000 Encounter for general adult medical examination without abnormal findings: Secondary | ICD-10-CM

## 2012-11-25 DIAGNOSIS — E039 Hypothyroidism, unspecified: Secondary | ICD-10-CM

## 2012-11-25 DIAGNOSIS — E669 Obesity, unspecified: Secondary | ICD-10-CM

## 2012-11-25 MED ORDER — LEVOTHYROXINE SODIUM 50 MCG PO TABS: 50.0000 ug | ORAL_TABLET | Freq: Every day | ORAL | Status: DC

## 2012-11-25 MED ORDER — FUROSEMIDE 20 MG PO TABS: 20.0000 mg | ORAL_TABLET | Freq: Every day | ORAL | Status: DC

## 2012-11-25 NOTE — Assessment & Plan Note (Signed)
Reviewed health habits including diet and exercise and skin cancer prevention Also reviewed health mt list, fam hx and immunizations  Rev wellness labs Sent for aug 2013 mammo Rev last surg report 3/13 Nl breast exam

## 2012-11-25 NOTE — Progress Notes (Signed)
Subjective:    Patient ID: Rachael Silva, female    DOB: [birth year], 67 y.o.   MRN: [id number]  HPI Here for health maintenance exam and to review chronic medical problems    Wt is down 27 lb with bmi of 37 By her scales has lost over 30 lb- was 265 at her peak weight  Really happy about that  Using the [employer] diet - by Dr Manuella Ghazi- to boost her metabolism-smaller more frequent meals and snacks-more fruits and veg  Also exercise - joined a gym- was hard to get started   Pap/ gyn exam  ? When her last one was  No abnormal pap smears  No symptoms/ no pain / no bleeding  No new partners  She does not want exam today   Zoster status  mammo 8/12 She did have one this past summer - had to have a bx (? Next one due) Self exam -no lumps or changes   colonosc 10/09 Recall-? 5 years - had some complications with the test - considered not doing another one  (asthma issue)  Asthma and allergies are overall stable  Stays a bit congested   Hypothyroidism  Pt has no clinical changes No change in energy level/ hair or skin/ edema and no tremor Lab Results  Component Value Date   TSH 3.50 11/18/2012     Hyperlipidemia Lab Results  Component Value Date   CHOL 180 11/18/2012   CHOL 205* 04/07/2011   CHOL 202* 09/26/2010   Lab Results  Component Value Date   HDL 52.60 11/18/2012   HDL 13.08 04/07/2011   HDL 65.78 09/26/2010   Lab Results  Component Value Date   LDLCALC 119* 11/18/2012   LDLCALC 92 05/10/2009   LDLCALC 55 06/04/2008   Lab Results  Component Value Date   TRIG 43.0 11/18/2012   TRIG 42.0 04/07/2011   TRIG 59.0 09/26/2010   Lab Results  Component Value Date   CHOLHDL 3 11/18/2012   CHOLHDL 3 04/07/2011   CHOLHDL 3 09/26/2010   Lab Results  Component Value Date   LDLDIRECT 138.9 04/07/2011   LDLDIRECT 123.4 09/26/2010   LDLDIRECT 139.1 04/05/2010   overall improved over time-has worked on diet and exercise   Patient Active Problem List  Diagnosis  .  HYPOTHYROIDISM  . HYPERCHOLESTEROLEMIA  . OBESITY  . SINUSITIS, CHRONIC  . ALLERGIC RHINITIS  . ASTHMA  . PERIODONTAL DISEASE  . GERD  . HOT FLASHES  . ARTHRITIS, RIGHT KNEE  . EDEMA  . MURMUR  . URINARY INCONTINENCE  . Other screening mammogram  . Routine general medical examination at a health care facility  . Abnormal mammogram with microcalcification   Past Medical History  Diagnosis Date  . Allergy     allergic rhinitis  . Asthma   . Arthritis     OA of knees  . Hiatal hernia   . Hypothyroid   . Labile blood pressure   . Urinary incontinence   . Pneumonia, eosinophilic   . Angioedema 02/2006   Past Surgical History  Procedure Laterality Date  . Cesarean section      x2  . Breast surgery  1966    breast biopsy   History  Substance Use Topics  . Smoking status: Never Smoker   . Smokeless tobacco: Not on file  . Alcohol Use: No   Family History  Problem Relation Age of Onset  . Diabetes Mother     pre-diabetic  . Heart disease Mother     ?  heart disease   Allergies  Allergen Reactions  . Erythromycin     REACTION: feels sick   Current Outpatient Prescriptions on File Prior to Visit  Medication Sig Dispense Refill  . albuterol (ACCUNEB) 0.63 MG/3ML nebulizer solution Take 1 ampule by nebulization every 6 (six) hours as needed.        Marland Kitchen aspirin-acetaminophen-caffeine (EXCEDRIN EXTRA STRENGTH) 250-250-65 MG per tablet Take by mouth as directed.        . fexofenadine (ALLEGRA ALLERGY) 60 MG tablet Take by mouth as directed.        . Fluticasone-Salmeterol (ADVAIR DISKUS) 250-50 MCG/DOSE AEPB Inhale into the lungs as directed.        . furosemide (LASIX) 20 MG tablet TAKE 1 TABLET BY MOUTH DAILY  30 tablet  5  . levothyroxine (SYNTHROID, LEVOTHROID) 50 MCG tablet TAKE 1 TABLET (50 MCG TOTAL) BY MOUTH DAILY.  30 tablet  3  . montelukast (SINGULAIR) 10 MG tablet Take 10 mg by mouth daily.        Marland Kitchen omeprazole (PRILOSEC) 20 MG capsule Take 20 mg by mouth  daily.        Marland Kitchen albuterol (PROVENTIL HFA;VENTOLIN HFA) 108 (90 BASE) MCG/ACT inhaler Inhale 2 puffs into the lungs every 4 (four) hours as needed for wheezing.  1 Inhaler  5   No current facility-administered medications on file prior to visit.    Review of Systems    Review of Systems  Constitutional: Negative for fever, appetite change, fatigue and unexpected weight change.  Eyes: Negative for pain and visual disturbance.  ENt pos for chronic allergy congestion/neg for sinus pain  Respiratory: Negative for cough and shortness of breath.  neg for wheeze today Cardiovascular: Negative for cp or palpitations    Gastrointestinal: Negative for nausea, diarrhea and constipation. neg for bowel changes Genitourinary: Negative for urgency and frequency. neg for any gyn change Skin: Negative for pallor or rash   Neurological: Negative for weakness, light-headedness, numbness and headaches.  Hematological: Negative for adenopathy. Does not bruise/bleed easily.  Psychiatric/Behavioral: Negative for dysphoric mood. The patient is not nervous/anxious.      Objective:   Physical Exam  Constitutional: She appears well-developed and well-nourished. No distress.  overwt but recent wt loss noted   HENT:  Head: Normocephalic and atraumatic.  Right Ear: External ear normal.  Left Ear: External ear normal.  Nose: Nose normal.  Mouth/Throat: Oropharynx is clear and moist. No oropharyngeal exudate.  Eyes: Conjunctivae and EOM are normal. Pupils are equal, round, and reactive to light. Right eye exhibits no discharge. Left eye exhibits no discharge. No scleral icterus.  Neck: Normal range of motion. Neck supple. No JVD present. Carotid bruit is not present. No thyromegaly present.  Cardiovascular: Normal rate, regular rhythm, normal heart sounds and intact distal pulses.  Exam reveals no gallop.   Pulmonary/Chest: Effort normal and breath sounds normal. No respiratory distress. She has no wheezes. She  exhibits no tenderness.  Abdominal: Soft. Bowel sounds are normal. She exhibits no distension, no abdominal bruit and no mass. There is no tenderness.  Genitourinary: No breast swelling, tenderness, discharge or bleeding.  Breast exam: No mass, nodules, thickening, tenderness, bulging, retraction, inflamation, nipple discharge or skin changes noted.  No axillary or clavicular LA.  Chaperoned exam.    Musculoskeletal: Normal range of motion. She exhibits no edema and no tenderness.  Lymphadenopathy:    She has no cervical adenopathy.  Neurological: She is alert. She has normal reflexes. No cranial nerve  deficit. She exhibits normal muscle tone. Coordination normal.  Skin: Skin is warm and dry. No rash noted. No erythema. No pallor.  Psychiatric: She has a normal mood and affect.          Assessment & Plan:

## 2012-11-25 NOTE — Assessment & Plan Note (Signed)
Pt is loosing wt with diet/ exercise- 30 lb so far Commended Will keep up the good work

## 2012-11-25 NOTE — Assessment & Plan Note (Signed)
Lipids overall stable and at goal  Urged to keep up diet and exercise Disc goals for lipids and reasons to control them Rev labs with pt Rev low sat fat diet in detail

## 2012-11-25 NOTE — Assessment & Plan Note (Signed)
Doing well  No symptoms No change in dose needed  tsh stable

## 2012-11-25 NOTE — Patient Instructions (Addendum)
Please send for report from [facility] - mammogram 8/13, thanks  If you are interested in a shingles/zoster vaccine - call your insurance to check on coverage,( you should not get it within 1 month of other vaccines) , then call us for a prescription  for it to take to a pharmacy that gives the shot , or make a nurse visit to get it here depending on your coverage Take care of yourself - keep working on weight loss with healthy diet and exercise

## 2013-01-08 ENCOUNTER — Ambulatory Visit (INDEPENDENT_AMBULATORY_CARE_PROVIDER_SITE_OTHER): Payer: Medicare Other | Admitting: Family Medicine

## 2013-01-08 ENCOUNTER — Encounter: Payer: Self-pay | Admitting: Family Medicine

## 2013-01-08 VITALS — BP 130/70 | HR 71 | Temp 98.3°F | Ht 66.75 in | Wt 237.0 lb

## 2013-01-08 DIAGNOSIS — J209 Acute bronchitis, unspecified: Secondary | ICD-10-CM | POA: Diagnosis not present

## 2013-01-08 MED ORDER — GUAIFENESIN-CODEINE 100-10 MG/5ML PO SYRP: 5.0000 mL | ORAL_SOLUTION | Freq: Four times a day (QID) | ORAL | Status: DC | PRN

## 2013-01-08 MED ORDER — AZITHROMYCIN 250 MG PO TABS: ORAL_TABLET | ORAL | Status: DC

## 2013-01-08 NOTE — Patient Instructions (Addendum)
Take zithromax for bronchitis  Drink fluids and rest  Try the cough syrup with codeine with caution  If worse or more wheezing let me know  Update if not starting to improve in a week or if worsening

## 2013-01-08 NOTE — Progress Notes (Signed)
Subjective:    Patient ID: Rachael Silva, female    DOB: [birth year], 67 y.o.   MRN: [id number]  HPI Here with uri symptoms for 10-14 days - including bad cough prod of green mucous  A little wheeze - that is fairly well controlled  Throat is sore , chest is sore  No fever that she knows of  Nose is congested -a little colored discharge -- with some blood in it this am  No facial pain ears are ok    Patient Active Problem List  Diagnosis  . HYPOTHYROIDISM  . HYPERCHOLESTEROLEMIA  . OBESITY  . SINUSITIS, CHRONIC  . ALLERGIC RHINITIS  . ASTHMA  . PERIODONTAL DISEASE  . GERD  . HOT FLASHES  . ARTHRITIS, RIGHT KNEE  . EDEMA  . MURMUR  . URINARY INCONTINENCE  . Other screening mammogram  . Routine general medical examination at a health care facility  . Abnormal mammogram with microcalcification  . Acute bronchitis with bronchospasm   Past Medical History  Diagnosis Date  . Allergy     allergic rhinitis  . Asthma   . Arthritis     OA of knees  . Hiatal hernia   . Hypothyroid   . Labile blood pressure   . Urinary incontinence   . Pneumonia, eosinophilic   . Angioedema 02/2006   Past Surgical History  Procedure Laterality Date  . Cesarean section      x2  . Breast surgery  1966    breast biopsy   History  Substance Use Topics  . Smoking status: Never Smoker   . Smokeless tobacco: Not on file  . Alcohol Use: No   Family History  Problem Relation Age of Onset  . Diabetes Mother     pre-diabetic  . Heart disease Mother     ? heart disease   Allergies  Allergen Reactions  . Erythromycin     REACTION: feels sick   Current Outpatient Prescriptions on File Prior to Visit  Medication Sig Dispense Refill  . albuterol (ACCUNEB) 0.63 MG/3ML nebulizer solution Take 1 ampule by nebulization every 6 (six) hours as needed.        . fexofenadine (ALLEGRA ALLERGY) 60 MG tablet Take by mouth as directed.        . Fluticasone-Salmeterol (ADVAIR DISKUS) 250-50  MCG/DOSE AEPB Inhale into the lungs as directed.        . furosemide (LASIX) 20 MG tablet Take 1 tablet (20 mg total) by mouth daily.  30 tablet  11  . levothyroxine (SYNTHROID, LEVOTHROID) 50 MCG tablet Take 1 tablet (50 mcg total) by mouth daily.  30 tablet  11  . montelukast (SINGULAIR) 10 MG tablet Take 10 mg by mouth daily.        Marland Kitchen omeprazole (PRILOSEC) 20 MG capsule Take 20 mg by mouth daily.        Marland Kitchen albuterol (PROVENTIL HFA;VENTOLIN HFA) 108 (90 BASE) MCG/ACT inhaler Inhale 2 puffs into the lungs every 4 (four) hours as needed for wheezing.  1 Inhaler  5   No current facility-administered medications on file prior to visit.     Review of Systems    Review of Systems  Constitutional: Negative for fever, appetite change,  and unexpected weight change.  ENT pos for rhinorrhea and drip / neg for ear or facial pain  Eyes: Negative for pain and visual disturbance.  Respiratory: Negative for shortness of breath.  pos for cough and occ wheeze  Cardiovascular: Negative for cp or  palpitations    Gastrointestinal: Negative for nausea, diarrhea and constipation.  Genitourinary: Negative for urgency and frequency.  Skin: Negative for pallor or rash   Neurological: Negative for weakness, light-headedness, numbness and headaches.  Hematological: Negative for adenopathy. Does not bruise/bleed easily.  Psychiatric/Behavioral: Negative for dysphoric mood. The patient is not nervous/anxious.      Objective:   Physical Exam  Constitutional: She appears well-developed and well-nourished. No distress.  obese and well appearing   HENT:  Head: Normocephalic and atraumatic.  Right Ear: External ear normal.  Left Ear: External ear normal.  Mouth/Throat: Oropharynx is clear and moist. No oropharyngeal exudate.  Nares are injected and congested    Eyes: Conjunctivae and EOM are normal. Pupils are equal, round, and reactive to light. Right eye exhibits no discharge. Left eye exhibits no discharge. No  scleral icterus.  Neck: Normal range of motion. Neck supple. No JVD present. Carotid bruit is not present. No thyromegaly present.  Cardiovascular: Normal rate and regular rhythm.   Pulmonary/Chest: Effort normal. No respiratory distress. She has wheezes. She has no rales. She exhibits no tenderness.  Harsh bs with scant wheeze on forced exp only  No rales occ scattered rhonchi Not sob  Lymphadenopathy:    She has no cervical adenopathy.  Neurological: She is alert.  Skin: Skin is warm and dry. No rash noted.  Psychiatric: She has a normal mood and affect.          Assessment & Plan:

## 2013-01-08 NOTE — Assessment & Plan Note (Signed)
Reactive airways are relatively stable- she will call if wheezing worsens  Disc symptomatic care - see instructions on AVS  Cover with zpak (of note she was exp to mother with pneumonia)  Robitussin ac for cough Update if not starting to improve in a week or if worsening    Will get pneumonia vaccine update the next time she is here

## 2013-01-13 ENCOUNTER — Telehealth: Payer: Self-pay | Admitting: Family Medicine

## 2013-01-13 MED ORDER — PREDNISONE 10 MG PO TABS: ORAL_TABLET | ORAL | Status: DC

## 2013-01-13 NOTE — Telephone Encounter (Signed)
Patient Information:  Caller Name: Jessamy  Phone: [phone]  Patient: Rachael Silva, Rachael Silva  Gender: Female  DOB: [birth year]  Age: 67 Years  PCP: Roxy Manns [facility])  Office Follow Up:  Does the office need to follow up with this patient?: Yes  Instructions For The Office: Please follow up with patient regarding continued colored sputum and congestion.  Dr. Milinda Antis had side that if not better she would call in a prescription for steroids.  Drug store is [facility] on [address]  RN Note:  Chest congestion has improved, but still coughing up colored sputum.  Cough is still bad at night and in the morning.  The cough syrup is too strong for the day, but good for the night;  Still has specks in blood at times but this is not new--has it the longer she has drainage or sputum.  Care advice given. When patient was in the office 2 weeks ago, provider stated that she would call in some steroids if patient does not improve.  This is the reason for the call.  Patient declined appointment.  Symptoms  Reason For Call & Symptoms: Congestion and cough.  Present for past 2 wks  Reviewed Health History In EMR: Yes  Reviewed Medications In EMR: Yes  Reviewed Allergies In EMR: Yes  Reviewed Surgeries / Procedures: Yes  Date of Onset of Symptoms: 12/30/2012  Treatments Tried: z-pak  Treatments Tried Worked: Yes  Guideline(s) Used:  Cough  Disposition Per Guideline:   See Today in Office  Reason For Disposition Reached:   Known COPD or other severe lung disease (i.e., bronchiectasis, cystic fibrosis, lung surgery) and worsening symptoms (i.e., increased sputum purulence or amount, increased breathing difficulty)  Advice Given:  Coughing Spasms:  Drink warm fluids. Inhale warm mist (Reason: both relax the airway and loosen up the phlegm).  Suck on cough drops or hard candy to coat the irritated throat.  Patient Refused Recommendation:  Patient Requests Prescription  Patient was seen 2 weeks  ago for this and is calling for a prescription of steroids that Dr. Milinda Antis mentioned if she did not improve

## 2013-01-13 NOTE — Telephone Encounter (Signed)
I will send prednisone to her pharmacy - I think she is familiar with side effects/ has taken it before  Follow up if no improvement

## 2013-01-14 NOTE — Telephone Encounter (Signed)
Pt notified Rx sent to pharmacy and to f/u if no improvement  

## 2013-01-31 ENCOUNTER — Other Ambulatory Visit: Payer: Self-pay | Admitting: Family Medicine

## 2013-01-31 NOTE — Telephone Encounter (Signed)
Ok to refill? Not on pt's med list

## 2013-01-31 NOTE — Telephone Encounter (Signed)
Will refill electronically  

## 2013-02-13 DIAGNOSIS — H1045 Other chronic allergic conjunctivitis: Secondary | ICD-10-CM | POA: Diagnosis not present

## 2013-02-13 DIAGNOSIS — J45909 Unspecified asthma, uncomplicated: Secondary | ICD-10-CM | POA: Diagnosis not present

## 2013-02-13 DIAGNOSIS — J3089 Other allergic rhinitis: Secondary | ICD-10-CM | POA: Diagnosis not present

## 2013-02-13 DIAGNOSIS — J209 Acute bronchitis, unspecified: Secondary | ICD-10-CM | POA: Diagnosis not present

## 2013-02-13 DIAGNOSIS — J301 Allergic rhinitis due to pollen: Secondary | ICD-10-CM | POA: Diagnosis not present

## 2013-02-19 ENCOUNTER — Ambulatory Visit: Payer: Self-pay | Admitting: Allergy

## 2013-02-19 DIAGNOSIS — J209 Acute bronchitis, unspecified: Secondary | ICD-10-CM | POA: Diagnosis not present

## 2013-02-27 DIAGNOSIS — J309 Allergic rhinitis, unspecified: Secondary | ICD-10-CM | POA: Diagnosis not present

## 2013-03-06 DIAGNOSIS — J309 Allergic rhinitis, unspecified: Secondary | ICD-10-CM | POA: Diagnosis not present

## 2013-03-11 DIAGNOSIS — J309 Allergic rhinitis, unspecified: Secondary | ICD-10-CM | POA: Diagnosis not present

## 2013-03-20 DIAGNOSIS — J45909 Unspecified asthma, uncomplicated: Secondary | ICD-10-CM | POA: Diagnosis not present

## 2013-03-20 DIAGNOSIS — J330 Polyp of nasal cavity: Secondary | ICD-10-CM | POA: Diagnosis not present

## 2013-03-20 DIAGNOSIS — J309 Allergic rhinitis, unspecified: Secondary | ICD-10-CM | POA: Diagnosis not present

## 2013-03-27 DIAGNOSIS — J309 Allergic rhinitis, unspecified: Secondary | ICD-10-CM | POA: Diagnosis not present

## 2013-04-03 DIAGNOSIS — J309 Allergic rhinitis, unspecified: Secondary | ICD-10-CM | POA: Diagnosis not present

## 2013-04-08 DIAGNOSIS — J309 Allergic rhinitis, unspecified: Secondary | ICD-10-CM | POA: Diagnosis not present

## 2013-04-18 DIAGNOSIS — J309 Allergic rhinitis, unspecified: Secondary | ICD-10-CM | POA: Diagnosis not present

## 2013-04-29 DIAGNOSIS — J309 Allergic rhinitis, unspecified: Secondary | ICD-10-CM | POA: Diagnosis not present

## 2013-05-08 DIAGNOSIS — J309 Allergic rhinitis, unspecified: Secondary | ICD-10-CM | POA: Diagnosis not present

## 2013-05-23 ENCOUNTER — Ambulatory Visit (INDEPENDENT_AMBULATORY_CARE_PROVIDER_SITE_OTHER): Payer: Medicare Other | Admitting: Family Medicine

## 2013-05-23 ENCOUNTER — Encounter: Payer: Self-pay | Admitting: Family Medicine

## 2013-05-23 VITALS — BP 112/74 | HR 79 | Temp 98.2°F | Ht 66.75 in | Wt 243.8 lb

## 2013-05-23 DIAGNOSIS — J209 Acute bronchitis, unspecified: Secondary | ICD-10-CM | POA: Diagnosis not present

## 2013-05-23 MED ORDER — GUAIFENESIN-CODEINE 100-10 MG/5ML PO SYRP: 5.0000 mL | ORAL_SOLUTION | Freq: Four times a day (QID) | ORAL | Status: DC | PRN

## 2013-05-23 MED ORDER — AZITHROMYCIN 250 MG PO TABS: ORAL_TABLET | ORAL | Status: DC

## 2013-05-23 MED ORDER — PREDNISONE 10 MG PO TABS: ORAL_TABLET | ORAL | Status: DC

## 2013-05-23 NOTE — Progress Notes (Signed)
Subjective:    Patient ID: Rachael Silva, female    DOB: 03-Dec-1945, 67 y.o.   MRN: [id number]  HPI Here with uri symptoms  Congestion and ST and cough  Phlegm that is green from nose and chest Some wheeze (using her asthma med)  Some sinus pain - both sides   No fever   Patient Active Problem List   Diagnosis Date Noted  . Abnormal mammogram with microcalcification 06/06/2011  . Other screening mammogram 04/17/2011  . Routine general medical examination at a health care facility 04/17/2011  . HYPOTHYROIDISM 06/01/2009  . EDEMA 03/03/2009  . SINUSITIS, CHRONIC 08/18/2008  . ARTHRITIS, RIGHT KNEE 04/15/2008  . OBESITY 02/18/2008  . HOT FLASHES 02/18/2008  . GERD 08/01/2007  . HYPERCHOLESTEROLEMIA 05/15/2007  . ALLERGIC RHINITIS 05/15/2007  . ASTHMA 05/15/2007  . PERIODONTAL DISEASE 05/15/2007  . MURMUR 05/15/2007  . URINARY INCONTINENCE 05/15/2007   Past Medical History  Diagnosis Date  . Allergy     allergic rhinitis  . Asthma   . Arthritis     OA of knees  . Hiatal hernia   . Hypothyroid   . Labile blood pressure   . Urinary incontinence   . Pneumonia, eosinophilic   . Angioedema 02/2006   Past Surgical History  Procedure Laterality Date  . Cesarean section      x2  . Breast surgery  1966    breast biopsy   History  Substance Use Topics  . Smoking status: Never Smoker   . Smokeless tobacco: Not on file  . Alcohol Use: No   Family History  Problem Relation Age of Onset  . Diabetes Mother     pre-diabetic  . Heart disease Mother     ? heart disease   Allergies  Allergen Reactions  . Erythromycin     REACTION: feels sick   Current Outpatient Prescriptions on File Prior to Visit  Medication Sig Dispense Refill  . albuterol (ACCUNEB) 0.63 MG/3ML nebulizer solution Take 1 ampule by nebulization every 6 (six) hours as needed.        Marland Kitchen albuterol (PROVENTIL HFA;VENTOLIN HFA) 108 (90 BASE) MCG/ACT inhaler Inhale 2 puffs into the lungs every 4 (four)  hours as needed for wheezing.  1 Inhaler  5  . fexofenadine (ALLEGRA ALLERGY) 60 MG tablet Take 60 mg by mouth daily.       . Fluticasone-Salmeterol (ADVAIR DISKUS) 250-50 MCG/DOSE AEPB Inhale 1 puff into the lungs 2 (two) times daily.       . furosemide (LASIX) 20 MG tablet Take 1 tablet (20 mg total) by mouth daily.  30 tablet  11  . ibuprofen (ADVIL,MOTRIN) 800 MG tablet TAKE 1 TABLET BY MOUTH EVERY 8 HOURS AS NEEDED FOR JOINT PAIN. TAKE WITH FOOD, STOP IT STOMACH UPSET  30 tablet  4  . levothyroxine (SYNTHROID, LEVOTHROID) 50 MCG tablet Take 1 tablet (50 mcg total) by mouth daily.  30 tablet  11  . montelukast (SINGULAIR) 10 MG tablet Take 10 mg by mouth daily.        Marland Kitchen omeprazole (PRILOSEC) 20 MG capsule Take 20 mg by mouth daily.         No current facility-administered medications on file prior to visit.      Review of Systems Review of Systems  Constitutional: Negative for fever, appetite change,  and unexpected weight change.  ENT pos for congestion/ st/ ear fullness and sinus fullness Eyes: Negative for pain and visual disturbance.  Respiratory: Negative for sob, pos  for cough and wheeze   Cardiovascular: Negative for cp or palpitations    Gastrointestinal: Negative for nausea, diarrhea and constipation.  Genitourinary: Negative for urgency and frequency.  Skin: Negative for pallor or rash   Neurological: Negative for weakness, light-headedness, numbness and headaches.  Hematological: Negative for adenopathy. Does not bruise/bleed easily.  Psychiatric/Behavioral: Negative for dysphoric mood. The patient is not nervous/anxious.         Objective:   Physical Exam  Constitutional: She appears well-developed and well-nourished. No distress.  HENT:  Head: Normocephalic and atraumatic.  Right Ear: External ear normal.  Left Ear: External ear normal.  Mouth/Throat: Oropharynx is clear and moist. No oropharyngeal exudate.  Nares are injected and congested  Throat-clear sinus  drainage No sinus tenderness  Eyes: Conjunctivae and EOM are normal. Pupils are equal, round, and reactive to light. Right eye exhibits no discharge. Left eye exhibits no discharge.  Neck: Normal range of motion. Neck supple. No JVD present. Carotid bruit is not present. No thyromegaly present.  Cardiovascular: Normal rate and regular rhythm.   Pulmonary/Chest: Effort normal. No respiratory distress. She has wheezes. She has no rales. She exhibits no tenderness.  Harsh bs with occ rhonchi and mild exp wheezes No prolonged exp phase  Lymphadenopathy:    She has no cervical adenopathy.  Neurological: She is alert.  Skin: Skin is warm and dry. No erythema.  Psychiatric: She has a normal mood and affect.          Assessment & Plan:

## 2013-05-23 NOTE — Patient Instructions (Addendum)
You have bronchitis- likely started from a cold Get some rest  Drink fluids  Take zpak and prednisone as directed Try the codeine cough medicine with caution Update if not starting to improve in a week or if worsening

## 2013-05-25 NOTE — Assessment & Plan Note (Signed)
In setting of pt with asthma and chronic sinusitis/ all rhinitis tx with zithromax and prednisone  Disc symptomatic care - see instructions on AVS  Update if not starting to improve in a week or if worsening  e

## 2013-06-05 DIAGNOSIS — J309 Allergic rhinitis, unspecified: Secondary | ICD-10-CM | POA: Diagnosis not present

## 2013-06-13 DIAGNOSIS — J309 Allergic rhinitis, unspecified: Secondary | ICD-10-CM | POA: Diagnosis not present

## 2013-06-20 DIAGNOSIS — J309 Allergic rhinitis, unspecified: Secondary | ICD-10-CM | POA: Diagnosis not present

## 2013-06-24 DIAGNOSIS — J309 Allergic rhinitis, unspecified: Secondary | ICD-10-CM | POA: Diagnosis not present

## 2013-07-08 DIAGNOSIS — J209 Acute bronchitis, unspecified: Secondary | ICD-10-CM | POA: Diagnosis not present

## 2013-07-08 DIAGNOSIS — H1045 Other chronic allergic conjunctivitis: Secondary | ICD-10-CM | POA: Diagnosis not present

## 2013-07-08 DIAGNOSIS — J301 Allergic rhinitis due to pollen: Secondary | ICD-10-CM | POA: Diagnosis not present

## 2013-07-08 DIAGNOSIS — J45909 Unspecified asthma, uncomplicated: Secondary | ICD-10-CM | POA: Diagnosis not present

## 2013-07-08 DIAGNOSIS — J3089 Other allergic rhinitis: Secondary | ICD-10-CM | POA: Diagnosis not present

## 2013-07-08 DIAGNOSIS — J019 Acute sinusitis, unspecified: Secondary | ICD-10-CM | POA: Diagnosis not present

## 2013-07-25 ENCOUNTER — Other Ambulatory Visit: Payer: Self-pay | Admitting: Family Medicine

## 2013-07-25 NOTE — Telephone Encounter (Signed)
Please refill for 6 mo 

## 2013-07-25 NOTE — Telephone Encounter (Signed)
Electronic refill request, please advise  

## 2013-07-25 NOTE — Telephone Encounter (Signed)
done

## 2013-07-29 ENCOUNTER — Ambulatory Visit (INDEPENDENT_AMBULATORY_CARE_PROVIDER_SITE_OTHER): Payer: Medicare Other

## 2013-07-29 DIAGNOSIS — Z23 Encounter for immunization: Secondary | ICD-10-CM

## 2013-08-01 DIAGNOSIS — J309 Allergic rhinitis, unspecified: Secondary | ICD-10-CM | POA: Diagnosis not present

## 2013-08-08 DIAGNOSIS — J309 Allergic rhinitis, unspecified: Secondary | ICD-10-CM | POA: Diagnosis not present

## 2013-08-19 DIAGNOSIS — J309 Allergic rhinitis, unspecified: Secondary | ICD-10-CM | POA: Diagnosis not present

## 2013-08-21 DIAGNOSIS — J45909 Unspecified asthma, uncomplicated: Secondary | ICD-10-CM | POA: Diagnosis not present

## 2013-08-21 DIAGNOSIS — J3089 Other allergic rhinitis: Secondary | ICD-10-CM | POA: Diagnosis not present

## 2013-08-21 DIAGNOSIS — H1045 Other chronic allergic conjunctivitis: Secondary | ICD-10-CM | POA: Diagnosis not present

## 2013-08-21 DIAGNOSIS — J301 Allergic rhinitis due to pollen: Secondary | ICD-10-CM | POA: Diagnosis not present

## 2013-08-29 DIAGNOSIS — J309 Allergic rhinitis, unspecified: Secondary | ICD-10-CM | POA: Diagnosis not present

## 2013-09-02 DIAGNOSIS — J309 Allergic rhinitis, unspecified: Secondary | ICD-10-CM | POA: Diagnosis not present

## 2013-09-03 DIAGNOSIS — J330 Polyp of nasal cavity: Secondary | ICD-10-CM | POA: Diagnosis not present

## 2013-09-03 DIAGNOSIS — J301 Allergic rhinitis due to pollen: Secondary | ICD-10-CM | POA: Diagnosis not present

## 2013-09-03 DIAGNOSIS — J45909 Unspecified asthma, uncomplicated: Secondary | ICD-10-CM | POA: Diagnosis not present

## 2013-09-09 DIAGNOSIS — J309 Allergic rhinitis, unspecified: Secondary | ICD-10-CM | POA: Diagnosis not present

## 2013-09-18 DIAGNOSIS — J309 Allergic rhinitis, unspecified: Secondary | ICD-10-CM | POA: Diagnosis not present

## 2013-09-26 DIAGNOSIS — J309 Allergic rhinitis, unspecified: Secondary | ICD-10-CM | POA: Diagnosis not present

## 2013-09-29 DIAGNOSIS — J330 Polyp of nasal cavity: Secondary | ICD-10-CM | POA: Diagnosis not present

## 2013-09-29 DIAGNOSIS — J45909 Unspecified asthma, uncomplicated: Secondary | ICD-10-CM | POA: Diagnosis not present

## 2013-09-29 DIAGNOSIS — J301 Allergic rhinitis due to pollen: Secondary | ICD-10-CM | POA: Diagnosis not present

## 2013-09-30 DIAGNOSIS — J309 Allergic rhinitis, unspecified: Secondary | ICD-10-CM | POA: Diagnosis not present

## 2013-10-30 DIAGNOSIS — J309 Allergic rhinitis, unspecified: Secondary | ICD-10-CM | POA: Diagnosis not present

## 2013-11-20 DIAGNOSIS — J309 Allergic rhinitis, unspecified: Secondary | ICD-10-CM | POA: Diagnosis not present

## 2013-11-25 DIAGNOSIS — J309 Allergic rhinitis, unspecified: Secondary | ICD-10-CM | POA: Diagnosis not present

## 2013-11-28 DIAGNOSIS — J309 Allergic rhinitis, unspecified: Secondary | ICD-10-CM | POA: Diagnosis not present

## 2013-12-09 ENCOUNTER — Encounter: Payer: Self-pay | Admitting: Family Medicine

## 2013-12-09 ENCOUNTER — Ambulatory Visit (INDEPENDENT_AMBULATORY_CARE_PROVIDER_SITE_OTHER): Payer: Medicare Other | Admitting: Family Medicine

## 2013-12-09 VITALS — BP 130/72 | HR 70 | Temp 98.1°F | Ht 66.75 in | Wt 265.0 lb

## 2013-12-09 DIAGNOSIS — M7550 Bursitis of unspecified shoulder: Secondary | ICD-10-CM

## 2013-12-09 DIAGNOSIS — J019 Acute sinusitis, unspecified: Secondary | ICD-10-CM

## 2013-12-09 DIAGNOSIS — B9689 Other specified bacterial agents as the cause of diseases classified elsewhere: Secondary | ICD-10-CM | POA: Insufficient documentation

## 2013-12-09 DIAGNOSIS — M719 Bursopathy, unspecified: Secondary | ICD-10-CM

## 2013-12-09 DIAGNOSIS — M67919 Unspecified disorder of synovium and tendon, unspecified shoulder: Secondary | ICD-10-CM | POA: Diagnosis not present

## 2013-12-09 MED ORDER — AMOXICILLIN-POT CLAVULANATE 875-125 MG PO TABS: 1.0000 | ORAL_TABLET | Freq: Two times a day (BID) | ORAL | Status: DC

## 2013-12-09 MED ORDER — PREDNISONE 10 MG PO TABS: ORAL_TABLET | ORAL | Status: DC

## 2013-12-09 NOTE — Patient Instructions (Signed)
Make a nurse appt for a month from now to get a pneumovax Take the Augmentin for sinus infection  Continue sinus irrigations  Also prednisone-this will help both the sinus infection and also the shoulder bursitis Follow up with your ENT as planned

## 2013-12-09 NOTE — Progress Notes (Signed)
Pre visit review using our clinic review tool, if applicable. No additional management support is needed unless otherwise documented below in the visit note. 

## 2013-12-09 NOTE — Progress Notes (Signed)
Subjective:    Patient ID: Rachael Silva, female    DOB: 1945/11/22, 68 y.o.   MRN: [id number]  HPI Here with several pains and also chest congestion   Pain in L arm  Started about 3-4 days ago  Up in shoulder area -dull/ nagging ache/ not throbbing No injury  No  heat or ice  Ibuprofen helps  Any movement hurts-esp if she raises it / and reaching hurts also   L leg hurt on Sunday- calf and shin  That is improved - in fact gone today No back pain  Not more swollen than usual (but she stopped wearing her supp hose)   Has chest congestion  Coughing up a lot of mucous Going on for a long time  She went to ENT- told her sinuses were the cause /also nasal polyps were inflammed  (oct) Was on abx and prednisone two times in a row  She feels like she has a sinus infection - a lot of facial pain  Mucous is tan/ yellow  No fever   (some sweats)  Still on allergy and asthma medicines    Patient Active Problem List   Diagnosis Date Noted  . Acute bronchitis with bronchospasm 05/23/2013  . Abnormal mammogram with microcalcification 06/06/2011  . Other screening mammogram 04/17/2011  . Routine general medical examination at a health care facility 04/17/2011  . HYPOTHYROIDISM 06/01/2009  . EDEMA 03/03/2009  . SINUSITIS, CHRONIC 08/18/2008  . ARTHRITIS, RIGHT KNEE 04/15/2008  . OBESITY 02/18/2008  . HOT FLASHES 02/18/2008  . GERD 08/01/2007  . HYPERCHOLESTEROLEMIA 05/15/2007  . ALLERGIC RHINITIS 05/15/2007  . ASTHMA 05/15/2007  . PERIODONTAL DISEASE 05/15/2007  . MURMUR 05/15/2007  . URINARY INCONTINENCE 05/15/2007   Past Medical History  Diagnosis Date  . Allergy     allergic rhinitis  . Asthma   . Arthritis     OA of knees  . Hiatal hernia   . Hypothyroid   . Labile blood pressure   . Urinary incontinence   . Pneumonia, eosinophilic   . Angioedema 02/2006   Past Surgical History  Procedure Laterality Date  . Cesarean section      x2  . Breast surgery  1966    breast biopsy   History  Substance Use Topics  . Smoking status: Never Smoker   . Smokeless tobacco: Not on file  . Alcohol Use: No   Family History  Problem Relation Age of Onset  . Diabetes Mother     pre-diabetic  . Heart disease Mother     ? heart disease   Allergies  Allergen Reactions  . Erythromycin     REACTION: feels sick   Current Outpatient Prescriptions on File Prior to Visit  Medication Sig Dispense Refill  . albuterol (ACCUNEB) 0.63 MG/3ML nebulizer solution Take 1 ampule by nebulization every 6 (six) hours as needed.        Marland Kitchen. albuterol (PROVENTIL HFA;VENTOLIN HFA) 108 (90 BASE) MCG/ACT inhaler Inhale 2 puffs into the lungs every 4 (four) hours as needed for wheezing.  1 Inhaler  5  . fexofenadine (ALLEGRA ALLERGY) 60 MG tablet Take 60 mg by mouth daily.       . Fluticasone-Salmeterol (ADVAIR DISKUS) 250-50 MCG/DOSE AEPB Inhale 1 puff into the lungs 2 (two) times daily.       . furosemide (LASIX) 20 MG tablet Take 1 tablet (20 mg total) by mouth daily.  30 tablet  11  . ibuprofen (ADVIL,MOTRIN) 800 MG tablet TAKE 1  TABLET BY MOUTH EVERY 8 HOURS AS NEEDED FOR JOINT PAIN. TAKE WITH FOOD, STOP IT STOMACH UPSET  30 tablet  5  . levothyroxine (SYNTHROID, LEVOTHROID) 50 MCG tablet Take 1 tablet (50 mcg total) by mouth daily.  30 tablet  11  . montelukast (SINGULAIR) 10 MG tablet Take 10 mg by mouth daily.        Marland Kitchen omeprazole (PRILOSEC) 20 MG capsule Take 20 mg by mouth daily.        Marland Kitchen guaiFENesin-codeine (ROBITUSSIN AC) 100-10 MG/5ML syrup Take 5 mL by mouth 4 (four) times daily as needed for cough.  120 mL  0   No current facility-administered medications on file prior to visit.       Review of Systems Review of Systems  Constitutional: Negative for fever, appetite change, and unexpected weight change.  ENT pos for cong/ rhinorrhea/ facial pain and purulent nasal drainage  Eyes: Negative for pain and visual disturbance.  Respiratory: Negative for  shortness of  breath.  pos for cough with occa wheeze Cardiovascular: Negative for cp or palpitations    Gastrointestinal: Negative for nausea, diarrhea and constipation.  Genitourinary: Negative for urgency and frequency.  MSK pos for L shoulder and upper arm pain  Skin: Negative for pallor or rash   Neurological: Negative for weakness, light-headedness, numbness and headaches.  Hematological: Negative for adenopathy. Does not bruise/bleed easily.  Psychiatric/Behavioral: Negative for dysphoric mood. The patient is not nervous/anxious.         Objective:   Physical Exam  Constitutional: She appears well-developed and well-nourished.  obese and well appearing   HENT:  Head: Normocephalic and atraumatic.  Right Ear: External ear normal.  Mouth/Throat: Oropharynx is clear and moist. No oropharyngeal exudate.  Nares are injected and congested  Bilateral maxillary sinus tenderness Yellow nasal d/c  TMs dull  Eyes: Conjunctivae and EOM are normal. Pupils are equal, round, and reactive to light. Right eye exhibits no discharge. Left eye exhibits no discharge.  Neck: Normal range of motion. Neck supple.  Cardiovascular: Normal rate and regular rhythm.   Pulmonary/Chest: Effort normal. No respiratory distress. She has wheezes. She has no rales.  Harsh bs with good air exch Scant wheeze on forced exp only  Musculoskeletal: She exhibits tenderness. She exhibits no edema.       Left shoulder: She exhibits decreased range of motion and tenderness. She exhibits no bony tenderness, no swelling, no effusion, no crepitus, no deformity, normal pulse and normal strength.  L shoulder No acromion tenderness or trapezius  Nl rom neck  Some deltoid tenderness, lat shoulder tenderness  Shoulder abd 90 deg with pain and limited int/ext rotation due to pain   L leg-no tenderness/ swelling or homans sign   Lymphadenopathy:    She has no cervical adenopathy.  Neurological: She is alert. She has normal strength. No  sensory deficit. She exhibits normal muscle tone. Coordination normal.  Skin: Skin is warm and dry. No rash noted. No erythema. No pallor.  Psychiatric: She has a normal mood and affect.          Assessment & Plan:

## 2013-12-10 NOTE — Assessment & Plan Note (Signed)
Suspect tendonitis bursitis based on area of pain and limited rom  Trial of pred taper 30 mg  Relative rest and ice  If no imp will ref to sport med  Rev some rom activities

## 2013-12-10 NOTE — Assessment & Plan Note (Signed)
Acute on chronic tx with augmentin and prednisone taper  F/u ent next mo as planned Disc symptomatic care - see instructions on AVS  Update if not starting to improve in a week or if worsening

## 2013-12-17 ENCOUNTER — Other Ambulatory Visit: Payer: Self-pay | Admitting: Family Medicine

## 2013-12-26 DIAGNOSIS — J309 Allergic rhinitis, unspecified: Secondary | ICD-10-CM | POA: Diagnosis not present

## 2013-12-29 DIAGNOSIS — J301 Allergic rhinitis due to pollen: Secondary | ICD-10-CM | POA: Diagnosis not present

## 2013-12-29 DIAGNOSIS — J330 Polyp of nasal cavity: Secondary | ICD-10-CM | POA: Diagnosis not present

## 2014-01-02 DIAGNOSIS — J309 Allergic rhinitis, unspecified: Secondary | ICD-10-CM | POA: Diagnosis not present

## 2014-01-06 DIAGNOSIS — J309 Allergic rhinitis, unspecified: Secondary | ICD-10-CM | POA: Diagnosis not present

## 2014-01-13 ENCOUNTER — Ambulatory Visit: Payer: Medicare Other

## 2014-01-13 DIAGNOSIS — Z23 Encounter for immunization: Secondary | ICD-10-CM

## 2014-01-27 ENCOUNTER — Other Ambulatory Visit: Payer: Self-pay | Admitting: Family Medicine

## 2014-02-05 DIAGNOSIS — J209 Acute bronchitis, unspecified: Secondary | ICD-10-CM | POA: Diagnosis not present

## 2014-02-05 DIAGNOSIS — J45909 Unspecified asthma, uncomplicated: Secondary | ICD-10-CM | POA: Diagnosis not present

## 2014-02-05 DIAGNOSIS — J018 Other acute sinusitis: Secondary | ICD-10-CM | POA: Diagnosis not present

## 2014-02-27 DIAGNOSIS — J309 Allergic rhinitis, unspecified: Secondary | ICD-10-CM | POA: Diagnosis not present

## 2014-03-06 DIAGNOSIS — J309 Allergic rhinitis, unspecified: Secondary | ICD-10-CM | POA: Diagnosis not present

## 2014-03-13 DIAGNOSIS — J309 Allergic rhinitis, unspecified: Secondary | ICD-10-CM | POA: Diagnosis not present

## 2014-03-25 ENCOUNTER — Ambulatory Visit: Payer: Self-pay | Admitting: Unknown Physician Specialty

## 2014-03-25 DIAGNOSIS — R062 Wheezing: Secondary | ICD-10-CM | POA: Diagnosis not present

## 2014-03-25 DIAGNOSIS — R059 Cough, unspecified: Secondary | ICD-10-CM | POA: Diagnosis not present

## 2014-03-25 DIAGNOSIS — J45909 Unspecified asthma, uncomplicated: Secondary | ICD-10-CM | POA: Diagnosis not present

## 2014-03-25 DIAGNOSIS — R918 Other nonspecific abnormal finding of lung field: Secondary | ICD-10-CM | POA: Diagnosis not present

## 2014-03-25 DIAGNOSIS — R05 Cough: Secondary | ICD-10-CM | POA: Diagnosis not present

## 2014-04-15 ENCOUNTER — Telehealth: Payer: Self-pay

## 2014-04-15 ENCOUNTER — Ambulatory Visit (INDEPENDENT_AMBULATORY_CARE_PROVIDER_SITE_OTHER): Payer: Medicare Other | Admitting: Family Medicine

## 2014-04-15 ENCOUNTER — Encounter: Payer: Self-pay | Admitting: Family Medicine

## 2014-04-15 VITALS — BP 140/80 | HR 80 | Temp 97.9°F | Wt 270.2 lb

## 2014-04-15 DIAGNOSIS — R609 Edema, unspecified: Secondary | ICD-10-CM | POA: Diagnosis not present

## 2014-04-15 NOTE — Patient Instructions (Signed)
Try to cut out as much salt as possible.   Get back in your stockings.  Elevate your feet as much as you can.   Take any extra lasix tomorrow.   Then go back to 1 a day after that.  Go to the lab on the way out.  We'll contact you with your lab report. Update Dr. Milinda Antisower as needed.

## 2014-04-15 NOTE — Telephone Encounter (Signed)
Pt said had one foot swollen for several days but last night both feet were swollen and swelling did not go down over night. No CP or SOB.  Pt thinks can see puncture wound on one foot ? Insect bite.Pt scheduled appt to see Dr Para Marchuncan today at 4:30 pm.

## 2014-04-15 NOTE — Progress Notes (Signed)
Pre visit review using our clinic review tool, if applicable. No additional management support is needed unless otherwise documented below in the visit note.  R foot recently swollen.  Had been swollen for about 3 days.  She didn't know if she saw a puncture on the dorsum of the R foot.  A little swelling on the L foot today.  No known trauma or puncture known.  Doesn't feel poorly otherwise.  No chest pain, not SOB.  Hadn't checked daily weight but weight is up overall.  No intentional salt load per patient report but she had some sausage and pizza.  Still on lasix, daily.  No new or skipped meds other than prednisone in early June; has been off for 1.5 weeks now and this is likely not related.  Swelling a little better by early AM usually but not today.    Meds, vitals, and allergies reviewed.   ROS: See HPI.  Otherwise, noncontributory.  GEN: nad, alert and oriented HEENT: mucous membranes moist NECK: supple w/o LA CV: rrr. PULM: ctab, no inc wob ABD: soft, +bs EXT: R>L foot edema.   SKIN: no acute rash, no wound on the foot noted

## 2014-04-16 LAB — BASIC METABOLIC PANEL
BUN: 15 mg/dL (ref 6–23)
CHLORIDE: 106 meq/L (ref 96–112)
CO2: 28 mEq/L (ref 19–32)
Calcium: 9.1 mg/dL (ref 8.4–10.5)
Creatinine, Ser: 0.9 mg/dL (ref 0.4–1.2)
GFR: 80.51 mL/min (ref >60.00)
Glucose, Bld: 90 mg/dL (ref 70–99)
POTASSIUM: 3.7 meq/L (ref 3.5–5.1)
Sodium: 141 mEq/L (ref 135–145)

## 2014-04-16 NOTE — Assessment & Plan Note (Signed)
Probable salt load.  Likely dependent contribution.  D/w pt about using her stockings, limit salt, take an extra dose of lasix tomorrow and update PCP prn.  She agrees.  See notes on labs.

## 2014-04-17 ENCOUNTER — Ambulatory Visit: Payer: Self-pay | Admitting: Unknown Physician Specialty

## 2014-04-17 ENCOUNTER — Encounter: Payer: Self-pay | Admitting: *Deleted

## 2014-04-17 DIAGNOSIS — J309 Allergic rhinitis, unspecified: Secondary | ICD-10-CM | POA: Diagnosis not present

## 2014-04-17 DIAGNOSIS — J9819 Other pulmonary collapse: Secondary | ICD-10-CM | POA: Diagnosis not present

## 2014-04-21 DIAGNOSIS — J309 Allergic rhinitis, unspecified: Secondary | ICD-10-CM | POA: Diagnosis not present

## 2014-04-28 DIAGNOSIS — J309 Allergic rhinitis, unspecified: Secondary | ICD-10-CM | POA: Diagnosis not present

## 2014-05-01 DIAGNOSIS — J309 Allergic rhinitis, unspecified: Secondary | ICD-10-CM | POA: Diagnosis not present

## 2014-05-07 DIAGNOSIS — J45909 Unspecified asthma, uncomplicated: Secondary | ICD-10-CM | POA: Diagnosis not present

## 2014-05-07 DIAGNOSIS — J3089 Other allergic rhinitis: Secondary | ICD-10-CM | POA: Diagnosis not present

## 2014-05-07 DIAGNOSIS — J301 Allergic rhinitis due to pollen: Secondary | ICD-10-CM | POA: Diagnosis not present

## 2014-05-07 DIAGNOSIS — J309 Allergic rhinitis, unspecified: Secondary | ICD-10-CM | POA: Diagnosis not present

## 2014-05-07 DIAGNOSIS — H1045 Other chronic allergic conjunctivitis: Secondary | ICD-10-CM | POA: Diagnosis not present

## 2014-05-11 ENCOUNTER — Other Ambulatory Visit: Payer: Self-pay | Admitting: *Deleted

## 2014-05-11 MED ORDER — IBUPROFEN 800 MG PO TABS: ORAL_TABLET | ORAL | Status: DC

## 2014-05-11 NOTE — Telephone Encounter (Signed)
Please refill for a year  Thanks  

## 2014-05-11 NOTE — Telephone Encounter (Signed)
Fax refill request, please advise  

## 2014-05-11 NOTE — Telephone Encounter (Signed)
done

## 2014-05-15 ENCOUNTER — Other Ambulatory Visit: Payer: Self-pay | Admitting: Family Medicine

## 2014-05-15 DIAGNOSIS — J309 Allergic rhinitis, unspecified: Secondary | ICD-10-CM | POA: Diagnosis not present

## 2014-05-19 DIAGNOSIS — J309 Allergic rhinitis, unspecified: Secondary | ICD-10-CM | POA: Diagnosis not present

## 2014-05-27 ENCOUNTER — Ambulatory Visit (INDEPENDENT_AMBULATORY_CARE_PROVIDER_SITE_OTHER): Payer: Medicare Other | Admitting: Family Medicine

## 2014-05-27 ENCOUNTER — Encounter: Payer: Self-pay | Admitting: Family Medicine

## 2014-05-27 VITALS — BP 140/68 | HR 76 | Temp 97.3°F | Ht 66.75 in | Wt 267.2 lb

## 2014-05-27 DIAGNOSIS — R058 Other specified cough: Secondary | ICD-10-CM | POA: Insufficient documentation

## 2014-05-27 DIAGNOSIS — R059 Cough, unspecified: Secondary | ICD-10-CM | POA: Diagnosis not present

## 2014-05-27 DIAGNOSIS — J45909 Unspecified asthma, uncomplicated: Secondary | ICD-10-CM

## 2014-05-27 DIAGNOSIS — R05 Cough: Secondary | ICD-10-CM | POA: Insufficient documentation

## 2014-05-27 HISTORY — DX: Other specified cough: R05.8

## 2014-05-27 MED ORDER — AZITHROMYCIN 250 MG PO TABS: ORAL_TABLET | ORAL | Status: DC

## 2014-05-27 MED ORDER — BENZONATATE 200 MG PO CAPS: 200.0000 mg | ORAL_CAPSULE | Freq: Three times a day (TID) | ORAL | Status: DC | PRN

## 2014-05-27 NOTE — Patient Instructions (Signed)
Please send for notes from Dr Jenne CampusMcQueen also cxr  Refer to pulmonary for ongoing prod cough and asthma Take the zithromax for your productive cough - I am worried about infection  Try the tessalon for cough relief

## 2014-05-27 NOTE — Progress Notes (Signed)
Subjective:    Patient ID: Rachael Silva, female    DOB: [birth year], 68 y.o.   MRN: [id number]  HPI Here with cough "I'm sick" Colored mucous -yellow and green - mainly from chest Chest is sore/hurting to cough  Thought she was getting better - but then worsened again  Going on for at least 2-3 weeks  Had seen Dr Jenne Campus when it started - abx and prednisone in June - thought it was "bad asthma" for about 14 days  Did cxr  Then had a follow up xray around July 4th - that is when she saw atelectasis  Told her to use her nebulizer machine/ albuterol  Also a nasal spray - gave her for nasal polyps - ? What   Her symptoms clear while on the antibiotics - for about 2 weeks and then gets worse  ? What antibiotic she was on - does not know  She did not call Dr Mikey Bussing office   She is interested in seeing a lung specialist   Also still using advair   She still sees allergist also  Gets shots every week  Just went and he noted she was worse  He did not change anything   Did it in July - mid July  Gave her incentive spirometer-that helped a bit  Still a bit sob going up the stairs  Also ordered blood work (done here)-for swollen feet   Patient Active Problem List   Diagnosis Date Noted  . Productive cough 05/27/2014  . Bursitis, shoulder 12/09/2013  . Acute bronchitis with bronchospasm 05/23/2013  . Abnormal mammogram with microcalcification 06/06/2011  . Other screening mammogram 04/17/2011  . Routine general medical examination at a health care facility 04/17/2011  . HYPOTHYROIDISM 06/01/2009  . EDEMA 03/03/2009  . SINUSITIS, CHRONIC 08/18/2008  . ARTHRITIS, RIGHT KNEE 04/15/2008  . OBESITY 02/18/2008  . HOT FLASHES 02/18/2008  . GERD 08/01/2007  . HYPERCHOLESTEROLEMIA 05/15/2007  . ALLERGIC RHINITIS 05/15/2007  . ASTHMA 05/15/2007  . PERIODONTAL DISEASE 05/15/2007  . MURMUR 05/15/2007  . URINARY INCONTINENCE 05/15/2007   Past Medical History  Diagnosis Date  .  Allergy     allergic rhinitis  . Asthma   . Arthritis     OA of knees  . Hiatal hernia   . Hypothyroid   . Labile blood pressure   . Urinary incontinence   . Pneumonia, eosinophilic   . Angioedema 02/2006   Past Surgical History  Procedure Laterality Date  . Cesarean section      x2  . Breast surgery  1966    breast biopsy   History  Substance Use Topics  . Smoking status: Never Smoker   . Smokeless tobacco: Never Used  . Alcohol Use: No   Family History  Problem Relation Age of Onset  . Diabetes Mother     pre-diabetic  . Heart disease Mother     ? heart disease   Allergies  Allergen Reactions  . Erythromycin     REACTION: feels sick   Current Outpatient Prescriptions on File Prior to Visit  Medication Sig Dispense Refill  . albuterol (ACCUNEB) 0.63 MG/3ML nebulizer solution Take 1 ampule by nebulization every 6 (six) hours as needed.        . fexofenadine (ALLEGRA ALLERGY) 60 MG tablet Take 60 mg by mouth daily.       . Fluticasone-Salmeterol (ADVAIR DISKUS) 250-50 MCG/DOSE AEPB Inhale 1 puff into the lungs 2 (two) times daily.       Marland Kitchen  furosemide (LASIX) 20 MG tablet TAKE 1 TABLET (20 MG TOTAL) BY MOUTH DAILY.  30 tablet  3  . gentamicin ointment (GARAMYCIN) 0.1 % Apply 1 application topically 2 (two) times daily.      Marland Kitchen ibuprofen (ADVIL,MOTRIN) 800 MG tablet TAKE 1 TABLET BY MOUTH EVERY 8 HOURS AS NEEDED FOR JOINT PAIN. TAKE WITH FOOD, STOP IT STOMACH UPSET  30 tablet  11  . levothyroxine (SYNTHROID, LEVOTHROID) 50 MCG tablet TAKE 1 TABLET BY MOUTH EVERY DAY  30 tablet  1  . montelukast (SINGULAIR) 10 MG tablet Take 10 mg by mouth daily.        Marland Kitchen omeprazole (PRILOSEC) 20 MG capsule Take 20 mg by mouth daily.        Marland Kitchen albuterol (PROVENTIL HFA;VENTOLIN HFA) 108 (90 BASE) MCG/ACT inhaler Inhale 2 puffs into the lungs every 4 (four) hours as needed for wheezing.  1 Inhaler  5   No current facility-administered medications on file prior to visit.    Review of  Systems Review of Systems  Constitutional: Negative for fever, appetite change,  and unexpected weight change.  ENT pos for congestion/ drip/rhinorrhea  Eyes: Negative for pain and visual disturbance.  Respiratory:pos  for cough and shortness of breath.pos for wheezing intermittently   Cardiovascular: Negative for cp or palpitations    Gastrointestinal: Negative for nausea, diarrhea and constipation.  Genitourinary: Negative for urgency and frequency.  Skin: Negative for pallor or rash   Neurological: Negative for weakness, light-headedness, numbness and headaches.  Hematological: Negative for adenopathy. Does not bruise/bleed easily.  Psychiatric/Behavioral: Negative for dysphoric mood. The patient is not nervous/anxious.         Objective:   Physical Exam  Constitutional: She appears well-developed and well-nourished. No distress.  HENT:  Head: Normocephalic and atraumatic.  Right Ear: External ear normal.  Left Ear: External ear normal.  Mouth/Throat: Oropharynx is clear and moist. No oropharyngeal exudate.  Nares are injected and congested   Clear rhinorrhea Throat clear No facial tenderness   Eyes: Conjunctivae and EOM are normal. Pupils are equal, round, and reactive to light. Right eye exhibits no discharge. Left eye exhibits no discharge. No scleral icterus.  Neck: Normal range of motion. Neck supple.  Cardiovascular: Normal rate and regular rhythm.   Pulmonary/Chest: Effort normal. No respiratory distress. She has wheezes. She has no rales. She exhibits no tenderness.  Harsh bs Rhonchi diffusely Wheeze on forced exp with prolonged exp phase No rales   Musculoskeletal: She exhibits no edema.  Lymphadenopathy:    She has no cervical adenopathy.  Neurological: She is alert. She has normal reflexes.  Skin: Skin is warm and dry. No rash noted. No erythema.  Psychiatric: She has a normal mood and affect.         Assessment & Plan:   Problem List Items Addressed  This Visit     Respiratory   ASTHMA - Primary     Pt sees allergist/ENT and still struggling to control asthma  Most recently triggered by infection - this seems to reoccur She requests a pulm consult     Relevant Orders      Ambulatory referral to Pulmonology     Other   Productive cough     With purulent sputum  Per pt atelectasis on last cxr  She has this recurrently - and it seems to resolve with abx and then come back  Asthma continues to be problematic Sent for ENT notes Cover with zpak  Ref to  pulm    Relevant Orders      Ambulatory referral to Pulmonology

## 2014-05-27 NOTE — Progress Notes (Signed)
Pre visit review using our clinic review tool, if applicable. No additional management support is needed unless otherwise documented below in the visit note. 

## 2014-05-28 NOTE — Assessment & Plan Note (Signed)
With purulent sputum  Per pt atelectasis on last cxr  She has this recurrently - and it seems to resolve with abx and then come back  Asthma continues to be problematic Sent for ENT notes Cover with zpak  Ref to pulm

## 2014-05-28 NOTE — Assessment & Plan Note (Signed)
>>  ASSESSMENT AND PLAN FOR ASTHMA WRITTEN ON 05/28/2014  9:20 AM BY TOWER, MARNE A, MD  Pt sees allergist/ENT and still struggling to control asthma  Most recently triggered by infection - this seems to reoccur She requests a pulm consult  Long disc of symptoms/hx and treatment options today  Sent for ENT notes as well  >25 minutes spent in face to face time with patient, >50% spent in counselling or coordination of care

## 2014-05-28 NOTE — Assessment & Plan Note (Addendum)
Pt sees allergist/ENT and still struggling to control asthma  Most recently triggered by infection - this seems to reoccur She requests a pulm consult  Long disc of symptoms/hx and treatment options today  Sent for ENT notes as well  >25 minutes spent in face to face time with patient, >50% spent in counselling or coordination of care

## 2014-06-02 DIAGNOSIS — J309 Allergic rhinitis, unspecified: Secondary | ICD-10-CM | POA: Diagnosis not present

## 2014-06-04 DIAGNOSIS — J309 Allergic rhinitis, unspecified: Secondary | ICD-10-CM | POA: Diagnosis not present

## 2014-06-05 ENCOUNTER — Other Ambulatory Visit: Payer: Self-pay | Admitting: Family Medicine

## 2014-06-05 NOTE — Telephone Encounter (Signed)
Will refill electronically  

## 2014-06-05 NOTE — Telephone Encounter (Signed)
Electronic refill request, please advise  

## 2014-06-11 DIAGNOSIS — J309 Allergic rhinitis, unspecified: Secondary | ICD-10-CM | POA: Diagnosis not present

## 2014-06-13 ENCOUNTER — Other Ambulatory Visit: Payer: Self-pay | Admitting: Family Medicine

## 2014-06-16 ENCOUNTER — Institutional Professional Consult (permissible substitution): Payer: Medicare Other | Admitting: Internal Medicine

## 2014-06-18 ENCOUNTER — Ambulatory Visit (INDEPENDENT_AMBULATORY_CARE_PROVIDER_SITE_OTHER): Payer: Medicare Other | Admitting: Internal Medicine

## 2014-06-18 ENCOUNTER — Encounter: Payer: Self-pay | Admitting: Internal Medicine

## 2014-06-18 VITALS — BP 124/66 | HR 88 | Ht 68.0 in | Wt 261.0 lb

## 2014-06-18 DIAGNOSIS — J45902 Unspecified asthma with status asthmaticus: Secondary | ICD-10-CM

## 2014-06-18 DIAGNOSIS — R0602 Shortness of breath: Secondary | ICD-10-CM | POA: Insufficient documentation

## 2014-06-18 DIAGNOSIS — J4542 Moderate persistent asthma with status asthmaticus: Secondary | ICD-10-CM

## 2014-06-18 DIAGNOSIS — R059 Cough, unspecified: Secondary | ICD-10-CM | POA: Diagnosis not present

## 2014-06-18 DIAGNOSIS — J45909 Unspecified asthma, uncomplicated: Secondary | ICD-10-CM | POA: Insufficient documentation

## 2014-06-18 DIAGNOSIS — R05 Cough: Secondary | ICD-10-CM | POA: Diagnosis not present

## 2014-06-18 DIAGNOSIS — R053 Chronic cough: Secondary | ICD-10-CM | POA: Insufficient documentation

## 2014-06-18 LAB — CBC WITH DIFFERENTIAL/PLATELET
Basophils Absolute: 0 10*3/uL (ref 0.0–0.1)
Basophils Relative: 0.6 % (ref 0.0–3.0)
Eosinophils Absolute: 2.6 10*3/uL — ABNORMAL HIGH (ref 0.0–0.7)
HCT: 44.2 % (ref 36.0–46.0)
Hemoglobin: 14.4 g/dL (ref 12.0–15.0)
LYMPHS ABS: 2.6 10*3/uL (ref 0.7–4.0)
Lymphocytes Relative: 33.6 % (ref 12.0–46.0)
MCHC: 32.6 g/dL (ref 30.0–36.0)
MCV: 89.5 fl (ref 78.0–100.0)
MONO ABS: 0.5 10*3/uL (ref 0.1–1.0)
Monocytes Relative: 5.9 % (ref 3.0–12.0)
Neutro Abs: 2.1 10*3/uL (ref 1.4–7.7)
Neutrophils Relative %: 26.9 % — ABNORMAL LOW (ref 43.0–77.0)
PLATELETS: 224 10*3/uL (ref 150.0–400.0)
RBC: 4.94 Mil/uL (ref 3.87–5.11)
RDW: 13.8 % (ref 11.5–15.5)
WBC: 7.8 10*3/uL (ref 4.0–10.5)

## 2014-06-18 NOTE — Patient Instructions (Signed)
We will have samples of your mucus tested.  It takes six weeks to get the final results of this. We will schedule a CT scan of your chest at [facility] today.  We will see you back in 2 weeks.

## 2014-06-18 NOTE — Progress Notes (Signed)
Date: 06/18/2014  MRN# [id number] Rachael Silva 19-Apr-1946   Rachael Silva is a 68 y.o. old female seen in consultation for cough with productive sputum  CC:  Chief Complaint  Patient presents with  . pulmonary consult    Referred by Dr. Milinda Antis for persistent cough, asthma.  Pt c/o prod cough with yellow/green/white mucus, chest heaviness, SOB with exertion  Xspring 2015.  Had recent cxr at [facility].     HPI:  Patient is a pleasant 68 yo female with PMHx of Asthma, nasal polyps, possible sinusitis, possible Eosinophilic Pneumonia, Arthritis, seen in consultation for chronic cough. Patient stated that she started have a cough with yellowish sputum since April of this year, the mucus production has dissipated a little, but the color can vary from grey to yellow at times.  Over the past year she has had recurrent bout of "bronchitis" treated with multiple rounds of antibiotics and steroids; her last round as 8/19, when she received a Zpak and prednisone for 10 days for recurrent cough with productive sputum production.  She states that her bronchitis episodes last about 1 week, then she get antibiotics and steroids, her symptoms (cough, sputum production, sob), improve; however, about 5 days after completing her antibiotics\steroids her symptoms start to reoccur.  She has seen and allergist (Dr. Clelia Croft) for recurrent allergies (dust) and is on OTC allegra. She has also seen an ENT physician (Dr. Jenne Campus), for sinusitis symptoms, who stopped her nasal steroid possible due to her nasal polyps. Patient states that she can walk about 1/2 block before getting short of breath, and cannot climb 1 flight of stairs.  She is currently on lasix for leg swelling. Patient has cats at home.  She is a never smoker. 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  Patient has been using her albuterol on a  daily basis since July.    PMHX:   Past Medical History  Diagnosis Date  . Allergy     allergic rhinitis  . Asthma   . Arthritis     OA of knees  . Hiatal hernia   . Hypothyroid   . Labile blood pressure   . Urinary incontinence   . Pneumonia, eosinophilic   . Angioedema 02/2006   Surgical Hx:  Past Surgical History  Procedure Laterality Date  . Cesarean section      x2  . Breast surgery  1966    breast biopsy   Family Hx:  Family History  Problem Relation Age of Onset  . Diabetes Mother     pre-diabetic  . Heart disease Mother     ? heart disease   Social Hx:   History  Substance Use Topics  . Smoking status: Never Smoker   . Smokeless tobacco: Never Used  . Alcohol Use: No   Medication:   Current Outpatient Rx  Name  Route  Sig  Dispense  Refill  . albuterol (ACCUNEB) 0.63 MG/3ML nebulizer solution   Nebulization   Take 1 ampule by nebulization every 6 (six) hours as needed.           Marland Kitchen EXPIRED: albuterol (PROVENTIL HFA;VENTOLIN HFA) 108 (90 BASE) MCG/ACT inhaler   Inhalation   Inhale 2 puffs into the lungs every 4 (four) hours as needed for wheezing.   1 Inhaler   5   . azithromycin (ZITHROMAX Z-PAK)  250 MG tablet      Take 2 pills by mouth today and then 1 pill daily for 4 days   6 tablet   0   . benzonatate (TESSALON) 200 MG capsule      TAKE 1 CAPSULE (200 MG TOTAL) BY MOUTH 3 (THREE) TIMES DAILY AS NEEDED FOR COUGH (SWALLOW WHOLE).   30 capsule   1   . fexofenadine (ALLEGRA ALLERGY) 60 MG tablet   Oral   Take 60 mg by mouth daily.          . Fluticasone-Salmeterol (ADVAIR DISKUS) 250-50 MCG/DOSE AEPB   Inhalation   Inhale 1 puff into the lungs 2 (two) times daily.          . furosemide (LASIX) 20 MG tablet      TAKE 1 TABLET (20 MG TOTAL) BY MOUTH DAILY.   30 tablet   3   . gentamicin ointment (GARAMYCIN) 0.1 %   Topical   Apply 1 application topically 2 (two) times daily.         Marland Kitchen ibuprofen (ADVIL,MOTRIN) 800 MG  tablet      TAKE 1 TABLET BY MOUTH EVERY 8 HOURS AS NEEDED FOR JOINT PAIN. TAKE WITH FOOD, STOP IT STOMACH UPSET   30 tablet   11   . levothyroxine (SYNTHROID, LEVOTHROID) 50 MCG tablet      TAKE 1 TABLET BY MOUTH EVERY DAY   30 tablet   1   . montelukast (SINGULAIR) 10 MG tablet   Oral   Take 10 mg by mouth daily.           Marland Kitchen omeprazole (PRILOSEC) 20 MG capsule   Oral   Take 20 mg by mouth daily.               Allergies:  Erythromycin  Review of Systems: Gen:  Denies  fever, sweats, chills HEENT: Denies blurred vision, double vision, ear pain, eye pain, hearing loss, nose bleeds, sore throat Cvc:  No dizziness, chest pain or heaviness Resp:   Denies cough or sputum porduction, shortness of breath Gi: Denies swallowing difficulty, stomach pain, nausea or vomiting, diarrhea, constipation, bowel incontinence Gu:  Denies bladder incontinence, burning urine Ext:   No Joint pain, stiffness or swelling Skin: No skin rash, easy bruising or bleeding or hives Endoc:  No polyuria, polydipsia , polyphagia or weight change Psych: No depression, insomnia or hallucinations  Other:  All other systems negative  Physical Examination:   VS: BP 124/66  Pulse 88  Ht  (1.727 m)  Wt 261 lb (118.389 kg)  BMI 39.69 kg/m2  SpO2 95%  General Appearance: No distress  Neuro:without focal findings, mental status, speech normal, alert and oriented, cranial nerves 2-12 intact, reflexes normal and symmetric, sensation grossly normal  HEENT: PERRLA, EOM intact, no ptosis, no other lesions noticed; Mallampati 2 Pulmonary: coarse upper airway sounds, mild diffuse wheezing, dec breath sounds at the bases;   Sputum Production:   CardiovascularNormal S1,S2.  No m/r/g.  Abdominal aorta pulsation normal.    Abdomen: Benign, Soft, non-tender, No masses, hepatosplenomegaly, No lymphadenopathy Renal:  No costovertebral tenderness  GU:  No performed at this time. Endoc: No evident thyromegaly, no  signs of acromegaly or Cushing features Skin:   warm, no rashes, no ecchymosis  Extremities: normal, no cyanosis, clubbing, no edema, warm with normal capillary refill. 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  - given her history of Asthma, now with sputum production, cough, sob -  ddx includes worsening asthma, ABPA.  Continue with current dose of Advair - cont with omeprazole for her GERD, if no improvement, may need to optimize by increasing frequency to  BID - cont with Allegra   Asthma, chronic Continue with Advair and PRN albuterol at this time.  Patient recently completed a course of steroids and antibiotics. Plan to optimize her GERD, sinusitis. Will plan for PFTs once she is closer to her baseline.   SOB (shortness of breath) See plan for cough and asthma.     Updated Medication List Outpatient Encounter Prescriptions as of 06/18/2014  Medication Sig  . albuterol (ACCUNEB) 0.63 MG/3ML nebulizer solution Take 1 ampule by nebulization every 6 (six) hours as needed.    Marland Kitchen albuterol (PROVENTIL HFA;VENTOLIN HFA) 108 (90 BASE) MCG/ACT inhaler Inhale 2 puffs into the lungs every 4 (four) hours as needed for wheezing.  Marland Kitchen azithromycin (ZITHROMAX Z-PAK) 250 MG tablet Take 2 pills by mouth today and then 1 pill daily for 4 days  . benzonatate (TESSALON) 200 MG capsule TAKE 1 CAPSULE (200 MG TOTAL) BY MOUTH 3 (THREE) TIMES DAILY AS NEEDED FOR COUGH (SWALLOW WHOLE).  Marland Kitchen fexofenadine (ALLEGRA ALLERGY) 60 MG tablet  Take 60 mg by mouth daily.   . Fluticasone-Salmeterol (ADVAIR DISKUS) 250-50 MCG/DOSE AEPB Inhale 1 puff into the lungs 2 (two) times daily.   . furosemide (LASIX) 20 MG tablet TAKE 1 TABLET (20 MG TOTAL) BY MOUTH DAILY.  Marland Kitchen gentamicin ointment (GARAMYCIN) 0.1 % Apply 1 application topically 2 (two) times daily.  Marland Kitchen ibuprofen (ADVIL,MOTRIN) 800 MG tablet TAKE 1 TABLET BY MOUTH EVERY 8 HOURS AS NEEDED FOR JOINT PAIN. TAKE WITH FOOD, STOP IT STOMACH UPSET  . levothyroxine (SYNTHROID, LEVOTHROID) 50 MCG tablet TAKE 1 TABLET BY MOUTH EVERY DAY  . montelukast (SINGULAIR) 10 MG tablet Take 10 mg by mouth daily.    Marland Kitchen omeprazole (PRILOSEC) 20 MG capsule Take 20 mg by mouth daily.      Orders for this visit: Orders Placed This Encounter  Procedures  . AFB Culture & Smear    Standing Status: Future     Number of Occurrences: 1     Standing Expiration Date: 06/19/2015  . Culture, sputum-assessment    Standing Status: Future     Number of Occurrences:      Standing Expiration Date: 06/19/2015  . CT Chest High Resolution    Standing Status: Future     Number of Occurrences:      Standing Expiration Date: 08/19/2015    Scheduling Instructions:     Please schedule CT chest without contrast high resolution, Entrikin or Bleitz only to read.  Thank you.    Order Specific Question:  Reason for Exam (SYMPTOM  OR DIAGNOSIS REQUIRED)    Answer:  cough    Order Specific Question:  Preferred imaging location?    Answer:  [facility]  . CBC w/Diff    Standing Status: Future     Number of Occurrences: 1     Standing Expiration Date: 06/18/2015  . Aspergillus IgE Panel    Standing Status: Future  Number of Occurrences: 1     Standing Expiration Date: 06/19/2015     Thank  you for the consultation and for allowing [facility] and to assist in the care of your patient. Our recommendations are noted above.  Please contact us if we can be of further service.   Stephanie Acre,  MD [facility] Office Number: [phone]

## 2014-06-19 DIAGNOSIS — J309 Allergic rhinitis, unspecified: Secondary | ICD-10-CM | POA: Diagnosis not present

## 2014-06-22 ENCOUNTER — Other Ambulatory Visit: Payer: Self-pay | Admitting: *Deleted

## 2014-06-22 DIAGNOSIS — R059 Cough, unspecified: Secondary | ICD-10-CM

## 2014-06-22 DIAGNOSIS — R05 Cough: Secondary | ICD-10-CM | POA: Diagnosis not present

## 2014-06-23 ENCOUNTER — Ambulatory Visit: Payer: Self-pay | Admitting: Internal Medicine

## 2014-06-23 ENCOUNTER — Telehealth: Payer: Self-pay | Admitting: *Deleted

## 2014-06-23 DIAGNOSIS — R05 Cough: Secondary | ICD-10-CM

## 2014-06-23 DIAGNOSIS — R0602 Shortness of breath: Secondary | ICD-10-CM | POA: Diagnosis not present

## 2014-06-23 DIAGNOSIS — J984 Other disorders of lung: Secondary | ICD-10-CM | POA: Diagnosis not present

## 2014-06-23 DIAGNOSIS — R059 Cough, unspecified: Secondary | ICD-10-CM | POA: Diagnosis not present

## 2014-06-23 DIAGNOSIS — R918 Other nonspecific abnormal finding of lung field: Secondary | ICD-10-CM | POA: Diagnosis not present

## 2014-06-23 DIAGNOSIS — J309 Allergic rhinitis, unspecified: Secondary | ICD-10-CM | POA: Diagnosis not present

## 2014-06-23 DIAGNOSIS — R058 Other specified cough: Secondary | ICD-10-CM

## 2014-06-23 NOTE — Telephone Encounter (Signed)
Orders placed. Nothing further needed at this time  

## 2014-06-23 NOTE — Telephone Encounter (Signed)
Pt brought 2 more sample of sputum but i dont see anymore Orders?

## 2014-06-24 ENCOUNTER — Other Ambulatory Visit: Payer: Self-pay | Admitting: *Deleted

## 2014-06-24 ENCOUNTER — Other Ambulatory Visit: Payer: Self-pay | Admitting: Internal Medicine

## 2014-06-24 DIAGNOSIS — R058 Other specified cough: Secondary | ICD-10-CM

## 2014-06-24 DIAGNOSIS — R05 Cough: Secondary | ICD-10-CM

## 2014-06-24 DIAGNOSIS — R059 Cough, unspecified: Secondary | ICD-10-CM | POA: Diagnosis not present

## 2014-06-24 NOTE — Assessment & Plan Note (Addendum)
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  - given her history of Asthma, now with sputum production, cough, sob -  ddx includes worsening asthma, ABPA.  Continue with current dose of Advair - cont with omeprazole for her GERD, if no improvement, may need to optimize by increasing frequency to  BID - cont with Allegra

## 2014-06-24 NOTE — Assessment & Plan Note (Signed)
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  - given her history of Asthma, now with sputum production, cough, sob -  ddx includes worsening asthma, ABPA.  Continue with current dose of Advair - cont with omeprazole for her GERD, if no improvement, may need to optimize by increasing frequency to  BID - cont with Allegra

## 2014-06-24 NOTE — Assessment & Plan Note (Signed)
See plan for cough and asthma.

## 2014-06-24 NOTE — Assessment & Plan Note (Signed)
Continue with Advair and PRN albuterol at this time.  Patient recently completed a course of steroids and antibiotics. Plan to optimize her GERD, sinusitis. Will plan for PFTs once she is closer to her baseline.

## 2014-06-29 ENCOUNTER — Telehealth: Payer: Self-pay | Admitting: *Deleted

## 2014-06-29 MED ORDER — HYDROCODONE-HOMATROPINE 5-1.5 MG/5ML PO SYRP: 5.0000 mL | ORAL_SOLUTION | Freq: Three times a day (TID) | ORAL | Status: DC | PRN

## 2014-06-29 NOTE — Telephone Encounter (Signed)
Pt said she is still taking the tessalon caps and they help her not feel like she is choking and gagging when she coughs but it's not helping her cough. Pt said her cough is awful and it's productive and she is coughing up yellow/green phlegm. Pt said her chest is so sore form coughing that she has to take a pain pill, please advise

## 2014-06-29 NOTE — Telephone Encounter (Signed)
She can have 3 refills of her tessalon -if this does not work well enough for her let me know

## 2014-06-29 NOTE — Telephone Encounter (Signed)
I will write for hycodan -warn her this has some narcotic in it but that is probably what she needs to get this under control  Px printed for pick up in IN box

## 2014-06-29 NOTE — Telephone Encounter (Signed)
Pt still c/o cough and chest pain. She request a rx for a cough med. She says she saw Dr Dema Severin, and has a f/u with him on 9/30, but he told her to contact you for any meds in the meantime.

## 2014-06-29 NOTE — Telephone Encounter (Signed)
Pt notified of Dr. Tower's comments/ recommendations  

## 2014-07-01 ENCOUNTER — Other Ambulatory Visit: Payer: Self-pay | Admitting: Family Medicine

## 2014-07-01 DIAGNOSIS — J45909 Unspecified asthma, uncomplicated: Secondary | ICD-10-CM | POA: Diagnosis not present

## 2014-07-01 NOTE — Telephone Encounter (Signed)
Please refill times 3 

## 2014-07-01 NOTE — Telephone Encounter (Signed)
Last office visit 05/27/2014.  Ok to refill?

## 2014-07-02 ENCOUNTER — Other Ambulatory Visit: Payer: Medicare Other

## 2014-07-02 ENCOUNTER — Encounter: Payer: Self-pay | Admitting: Adult Health

## 2014-07-02 ENCOUNTER — Ambulatory Visit (INDEPENDENT_AMBULATORY_CARE_PROVIDER_SITE_OTHER): Payer: Medicare Other | Admitting: Adult Health

## 2014-07-02 VITALS — BP 152/72 | HR 85 | Temp 98.2°F | Ht 67.0 in | Wt 264.4 lb

## 2014-07-02 DIAGNOSIS — R05 Cough: Secondary | ICD-10-CM

## 2014-07-02 DIAGNOSIS — J45902 Unspecified asthma with status asthmaticus: Secondary | ICD-10-CM | POA: Diagnosis not present

## 2014-07-02 DIAGNOSIS — J4542 Moderate persistent asthma with status asthmaticus: Secondary | ICD-10-CM

## 2014-07-02 DIAGNOSIS — R059 Cough, unspecified: Secondary | ICD-10-CM

## 2014-07-02 MED ORDER — GUAIFENESIN-CODEINE 100-10 MG/5ML PO SYRP: 5.0000 mL | ORAL_SOLUTION | Freq: Four times a day (QID) | ORAL | Status: DC | PRN

## 2014-07-02 MED ORDER — CEFDINIR 300 MG PO CAPS: 300.0000 mg | ORAL_CAPSULE | Freq: Two times a day (BID) | ORAL | Status: DC

## 2014-07-02 NOTE — Patient Instructions (Addendum)
Labs today  Omnicef  Twice daily for 7 days , take with food ,  Eat yogurt daily .  Cheratussin 1 tsp every 6hr As needed  Cough, may make you sleepy Please contact office for sooner follow up if symptoms do not improve or worsen or seek emergency care  Follow up with Dr. Dema Severin next week as planned     Late ADD :  Begin Prednisone  daily for possible ABPA vs chronic eosinophilic PNA relapse  Ov in 1 week to discuss

## 2014-07-02 NOTE — Assessment & Plan Note (Addendum)
Recurrent exacerbation over last 6 months with elevated serum eosinophils and abnormal CT  Aspergillus IgE panel neg w/ low positive titers for Asp Nidulans, Flavus and Terreus and amstel.  Will need follow up CT in future.  Check total IgE w/ RAST  And check mold profile.  Will tx w/ Omnicef although has had multiple abx for this with limited improvement , advised on risks of frequent abx use  Hold on additional steroids at this time until total IgE returns  Have her follow up in 1 week to discuss results and follow up with Dr. Dema Severin   Plan Labs today  Omnicef  Twice daily for 7 days , take with food ,  Eat yogurt daily .  Cheratussin 1 tsp every 6hr As needed  Cough, may make you sleepy Please contact office for sooner follow up if symptoms do not improve or worsen or seek emergency care  Follow up with Dr. Dema Severin next week as planned

## 2014-07-02 NOTE — Progress Notes (Signed)
Subjective:    Patient ID: Rachael Silva, female    DOB: 1946/05/09, 68 y.o.   MRN: [id number]  HPI 06/18/14 IOV /Pulmonary consult  [name] is a pleasant 68 yo female with PMHx of Asthma, nasal polyps, possible sinusitis, possible Eosinophilic Pneumonia, Arthritis, seen in consultation for chronic cough. Patient stated that she started have a cough with yellowish sputum since April of this year, the mucus production has dissipated a little, but the color can vary from grey to yellow at times.  Over the past year she has had recurrent bout of "bronchitis" treated with multiple rounds of antibiotics and steroids; her last round as 8/19, when she received a Zpak and prednisone for 10 days for recurrent cough with productive sputum production.  She states that her bronchitis episodes last about 1 week, then she get antibiotics and steroids, her symptoms (cough, sputum production, sob), improve; however, about 5 days after completing her antibiotics\steroids her symptoms start to reoccur.  She has seen and allergist (Dr. Clelia Croft) for recurrent allergies (dust) and is on OTC allegra. She has also seen an ENT physician (Dr. Jenne Campus), for sinusitis symptoms, who stopped her nasal steroid possible due to her nasal polyps. Patient states that she can walk about 1/2 block before getting short of breath, and cannot climb 1 flight of stairs.  She is currently on lasix for leg swelling. Patient has cats at home.  She is a never smoker. 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  Patient has been using her albuterol on a daily basis since July.    07/02/2014 Acute OV  Pt returns for persistent symptoms of cough and shortness of breath. She was seen for pulmonary consult 2 weeks ago Has had a productive cough for 5-6 months with several courses of abx and steroids . She was recommended for a CT  chest that showed mild diffuse bronchial wall thickening , very mild subpleural reticulation that is nonspecific  And peribronchial ground glass attentuation.  Areas of scarring in RLL w/ nodular component.  Sputum cx prelim neg AFB  CBC shows high eosinophil count  Cough is wearing her out, cant sleep.  PCP gave her hydromet cough syrup , causes her to be sleepy and can not tolerate. Wants cheratussin it is not as strong.  Aspergillous IgE panel was neg.  Aspergillus nidulans, flavus , amstel and terrus was low positive  No total IgE done.  Pt denies fever, chest pain, orthopnea or edema.  No recent travel . Is retired. Has cats in home .       Review of Systems Constitutional:   No  weight loss, night sweats,  Fevers, chills, fatigue, or  lassitude.  HEENT:   No headaches,  Difficulty swallowing,  Tooth/dental problems, or  Sore throat,                No sneezing, itching, ear ache,  +nasal congestion, post nasal drip,   CV:  No chest pain,  Orthopnea, PND, swelling in lower extremities, anasarca, dizziness, palpitations, syncope.   GI  No heartburn, indigestion, abdominal pain, nausea, vomiting, diarrhea, change in bowel habits, loss of appetite, bloody stools.   Resp:  No chest wall deformity  Skin: no rash or lesions.  GU: no dysuria, change in color of urine, no urgency or frequency.  No flank pain, no  hematuria   MS:  No joint pain or swelling.  No decreased range of motion.  No back pain.  Psych:  No change in mood or affect. No depression or anxiety.  No memory loss.         Objective:   Physical Exam GEN: A/Ox3; pleasant , NAD, well nourished   HEENT:  [place]/AT,  EACs-clear, TMs-wnl, NOSE-clear, THROAT-clear, no lesions, no postnasal drip or exudate noted.   NECK:  Supple w/ fair ROM; no JVD; normal carotid impulses w/o bruits; no thyromegaly or nodules palpated; no lymphadenopathy.  RESP  Few trace rhonchi  no accessory muscle use, no dullness to  percussion  CARD:  RRR, no m/r/g  , no peripheral edema, pulses intact, no cyanosis or clubbing.  GI:   Soft & nt; nml bowel sounds; no organomegaly or masses detected.  Musco: Warm bil, no deformities or joint swelling noted.   Neuro: alert, no focal deficits noted.    Skin: Warm, no lesions or rashes         Assessment & Plan:

## 2014-07-03 ENCOUNTER — Encounter: Payer: Self-pay | Admitting: Adult Health

## 2014-07-03 LAB — ALLERGY FULL PROFILE
ALTERNARIA ALTERNATA: 0.11 kU/L — AB
Allergen, D pternoyssinus,d7: 0.48 kU/L — ABNORMAL HIGH
Allergen,Goose feathers, e70: 0.12 kU/L — ABNORMAL HIGH
Aspergillus fumigatus, m3: <0.1 kU/L
Bahia Grass: <0.1 kU/L
Bermuda Grass: <0.1 kU/L
Box Elder IgE: <0.1 kU/L
CANDIDA ALBICANS: 0.13 kU/L — AB
Cat Dander: <0.1 kU/L
Common Ragweed: <0.1 kU/L
D. farinae: 0.88 kU/L — ABNORMAL HIGH
Dog Dander: 0.11 kU/L — ABNORMAL HIGH
FESCUE: 0.1 kU/L — AB
Helminthosporium halodes: <0.1 kU/L
House Dust Hollister: 0.13 kU/L — ABNORMAL HIGH
IgE (Immunoglobulin E), Serum: 2401 kU/L — ABNORMAL HIGH (ref <115)
Lamb's Quarters: 0.11 kU/L — ABNORMAL HIGH
Oak: <0.1 kU/L
Plantain: 0.15 kU/L — ABNORMAL HIGH
Stemphylium Botryosum: <0.1 kU/L
Sycamore Tree: 0.1 kU/L — ABNORMAL HIGH
Timothy Grass: <0.1 kU/L

## 2014-07-03 LAB — MOLD PROFILE
Alternaria Alternata: 0.11 kU/L — ABNORMAL HIGH
Aspergillus fumigatus, m3: <0.1 kU/L
Cladosporium Herbarum: 0.1 kU/L — ABNORMAL HIGH
Helminthosporium halodes: <0.1 kU/L
PENICILLIUM NOTATUM: 0.12 kU/L — AB

## 2014-07-03 MED ORDER — PREDNISONE 20 MG PO TABS: ORAL_TABLET | ORAL | Status: DC

## 2014-07-03 NOTE — Addendum Note (Signed)
Addended by: Julio Sicks on: 07/03/2014 05:29 PM   Modules accepted: Orders

## 2014-07-06 ENCOUNTER — Ambulatory Visit: Payer: Medicare Other | Admitting: Internal Medicine

## 2014-07-08 ENCOUNTER — Encounter: Payer: Self-pay | Admitting: Internal Medicine

## 2014-07-08 ENCOUNTER — Ambulatory Visit (INDEPENDENT_AMBULATORY_CARE_PROVIDER_SITE_OTHER): Payer: Medicare Other | Admitting: Internal Medicine

## 2014-07-08 VITALS — BP 136/80 | HR 80 | Ht 67.0 in | Wt 259.0 lb

## 2014-07-08 DIAGNOSIS — J45909 Unspecified asthma, uncomplicated: Secondary | ICD-10-CM | POA: Diagnosis not present

## 2014-07-08 DIAGNOSIS — B4481 Allergic bronchopulmonary aspergillosis: Secondary | ICD-10-CM | POA: Insufficient documentation

## 2014-07-08 DIAGNOSIS — J209 Acute bronchitis, unspecified: Secondary | ICD-10-CM | POA: Diagnosis not present

## 2014-07-08 DIAGNOSIS — J4540 Moderate persistent asthma, uncomplicated: Secondary | ICD-10-CM

## 2014-07-08 NOTE — Assessment & Plan Note (Addendum)
This is currently a working diagnosis, other differentials include hypersensitivity pneumonitis, eosinophilic pneumonias, NSIP, BOOP. All of these diagnoses are treated with high-dose steroids for a prolonged period of time. She does have significant clinical improvement once steroids were initiated, with rapid decline when steroids are terminated. Further workup to include Aspergillus skin testing, IgG levels, IgA levels. Patient will require prolonged course of steroids ,possibly 3-6 months. Pulmonary will taper the steroids. Patient educated on side effects of steroids (weight gain, or retention, increased appetite, muscle weakness, bone degradation, dysfunctional calcium levels, elevated blood glucose levels, anxiety/agitation at night, decreased immune). Further followup with primary care physician for close monitoring of calcium levels and bone density scan while on high-dose steroids. Followup in one month for pulmonary for further steroid tapering and clinical evaluation  Patient educated on the current diagnosis, she understands that this is not an active infection, it is most likely a hyperreactive reaction to antigen exposure. She does not need/require antifungal treatment at this time. This is her initial treatment course, if she has refractory or recurrent symptoms will consider adding antifungals at that time.

## 2014-07-08 NOTE — Assessment & Plan Note (Signed)
Most likely hyperactivity to aspergillus antigen Currently on steroids.

## 2014-07-08 NOTE — Patient Instructions (Signed)
Take your Prednisone as follows:  September 30-October 8: 60 mg every morning with breakfast.  October 9-October 23: 60 mg every OTHER morning with breakfast.  October 24-next visit: 40 mg every other morning with breakfast. This will be called in to the pharmacy for you.  [facility] Labs today.  We will follow up in 1 month to review all results.  Call our office if you need us sooner.

## 2014-07-08 NOTE — Assessment & Plan Note (Signed)
Recurrent exacerbation over last 6 months with elevated serum eosinophils and abnormal CT  Aspergillus IgE panel neg w/ low positive titers for Asp Nidulans, Flavus and Terreus and amstel.  Will need follow up CT in future.  She has an elevated total IgE level, CT findings with mild bronchiectasis, groundglass opacities-given her history of asthma and his current lab and radiographic abnormalities there is a high suspicion for ABPA. Will further check an Aspergillus IgG, IgA, and Aspergillus skin testing. She's currently on prednisone 60 mg daily. Patient is to continue prednisone from 07/07/2014 to 07/16/2014 - 60 mg prednisone daily From 07/17/2014 to 07/31/2014-60 mg prednisone every other day From 08/01/2014 until followup with pulmonary-prednisone 40 mg daily Followup a pulmonary in one month

## 2014-07-08 NOTE — Progress Notes (Signed)
MRN# [id number] Rachael Silva [birth year]   CC: Chief Complaint  Patient presents with  . Follow-up    Review CT chest and labs drawn last week in [place] office. Saw Rachael Silva last week, was given abx and prednisone, cheratussin.  Pt's cough, SOB has improved greatly on this.        Brief History:  06/18/14 IOV /Pulmonary consult  Rachael Silva is a pleasant 68 yo female with PMHx of Asthma, nasal polyps, possible sinusitis, possible Eosinophilic Pneumonia, Arthritis, seen in consultation for chronic cough.  Patient stated that she started have a cough with yellowish sputum since April of this year, the mucus production has dissipated a little, but the color can vary from grey to yellow at times.  Over the past year she has had recurrent bout of "bronchitis" treated with multiple rounds of antibiotics and steroids; her last round as 8/19, when she received a Zpak and prednisone for 10 days for recurrent cough with productive sputum production.  She states that her bronchitis episodes last about 1 week, then she get antibiotics and steroids, her symptoms (cough, sputum production, sob), improve; however, about 5 days after completing her antibiotics\steroids her symptoms start to reoccur.  She has seen and allergist (Dr. Clelia Croft) for recurrent allergies (dust) and is on OTC allegra.  She has also seen an ENT physician (Dr. Jenne Campus), for sinusitis symptoms, who stopped her nasal steroid possible due to her nasal polyps.  Patient states that she can walk about 1/2 block before getting short of breath, and cannot climb 1 flight of stairs. She is currently on lasix for leg swelling.  Patient has cats at home. She is a never smoker. 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  Patient has been using her albuterol on a daily basis since July.    Events since last clinic visit:   07-02-14:  presented to [facility] with worsening SOB\DOE, sputum production, noted to have elevated level of IgE, high suspicion for ABPA, started on prednisone 0.5mg /kg/QD (60mg  daily). She had allergy profile testing performed, serum. Rapid improvement per patient since starting steroids.  At today's visit he states that she is rapidly improve, since starting prednisone which was given to her at her last visit to pulmonology [facility]. Patient is currently on 60 mg of prednisone daily, being treated for suspected ABPA (allergic bronchopulmonary aspergillosis). Should also like to review the results of her recent CT chest scan. Today she endorses mild dyspnea on exertion, mild cough which is nonproductive.    PMHX:   Past Medical History  Diagnosis Date  . Allergy     allergic rhinitis  . Asthma   . Arthritis     OA of knees  . Hiatal hernia   . Hypothyroid   . Labile blood pressure   . Urinary incontinence   . Pneumonia, eosinophilic   . Angioedema 02/2006   Surgical Hx:  Past Surgical History  Procedure Laterality Date  . Cesarean section      x2  . Breast surgery  1966    breast biopsy   Family Hx:  Family History  Problem Relation Age of Onset  . Diabetes Mother     pre-diabetic  . Heart disease Mother     ? heart disease   Social Hx:   History  Substance Use Topics  . Smoking  status: Never Smoker   . Smokeless tobacco: Never Used  . Alcohol Use: No   Medication:   Current Outpatient Rx  Name  Route  Sig  Dispense  Refill  . albuterol (ACCUNEB) 0.63 MG/3ML nebulizer solution   Nebulization   Take 1 ampule by nebulization every 6 (six) hours as needed.           Marland Kitchen albuterol (PROVENTIL HFA;VENTOLIN HFA) 108 (90 BASE) MCG/ACT inhaler   Inhalation   Inhale 2 puffs into the lungs every 4 (four) hours as needed for wheezing.   1 Inhaler   5   . azithromycin (ZITHROMAX Z-PAK) 250 MG tablet      Take 2 pills by mouth today and then 1 pill daily  for 4 days   6 tablet   0   . benzonatate (TESSALON) 200 MG capsule      TAKE 1 CAPSULE (200 MG TOTAL) BY MOUTH 3 (THREE) TIMES DAILY AS NEEDED FOR COUGH (SWALLOW WHOLE).   30 capsule   3   . budesonide (PULMICORT) 0.5 MG/2ML nebulizer solution   Nebulization   Take 0.5 mg by nebulization 2 (two) times daily.         . cefdinir (OMNICEF) 300 MG capsule   Oral   Take 1 capsule (300 mg total) by mouth 2 (two) times daily.   14 capsule   0   . fexofenadine (ALLEGRA ALLERGY) 60 MG tablet   Oral   Take 60 mg by mouth daily.          . Fluticasone-Salmeterol (ADVAIR DISKUS) 250-50 MCG/DOSE AEPB   Inhalation   Inhale 1 puff into the lungs 2 (two) times daily.          . furosemide (LASIX) 20 MG tablet      TAKE 1 TABLET (20 MG TOTAL) BY MOUTH DAILY.   30 tablet   3   . gentamicin ointment (GARAMYCIN) 0.1 %   Topical   Apply 1 application topically 2 (two) times daily.         Marland Kitchen guaiFENesin-codeine (CHERATUSSIN AC) 100-10 MG/5ML syrup   Oral   Take 5 mLs by mouth every 6 (six) hours as needed for cough.   120 mL   0   . ibuprofen (ADVIL,MOTRIN) 800 MG tablet      TAKE 1 TABLET BY MOUTH EVERY 8 HOURS AS NEEDED FOR JOINT PAIN. TAKE WITH FOOD, STOP IT STOMACH UPSET   30 tablet   11   . levothyroxine (SYNTHROID, LEVOTHROID) 50 MCG tablet      TAKE 1 TABLET BY MOUTH EVERY DAY   30 tablet   1   . montelukast (SINGULAIR) 10 MG tablet   Oral   Take 10 mg by mouth daily.           Marland Kitchen omeprazole (PRILOSEC) 20 MG capsule   Oral   Take 20 mg by mouth daily.           . predniSONE (DELTASONE) 20 MG tablet      Take 3 tabs with food until seen in office .   90 tablet   0      Review of Systems: Gen:  Denies  fever, sweats, chills HEENT: Denies blurred vision, double vision, ear pain, eye pain, hearing loss, nose bleeds, sore throat Cvc:  No dizziness, chest pain or heaviness Resp:   Admits to mild cough and mild to whitish sputum production, mild  dyspnea on exertion Gi: Denies swallowing difficulty, stomach  pain, nausea or vomiting, diarrhea, constipation, bowel incontinence Gu:  Denies bladder incontinence, burning urine Ext:   No Joint pain, stiffness or swelling Skin: No skin rash, easy bruising or bleeding or hives Endoc:  No polyuria, polydipsia , polyphagia or weight change Psych: No depression, insomnia or hallucinations  Other:  All other systems negative  Allergies:  Erythromycin  Physical Examination:  VS: BP 136/80  Pulse 80  Ht 5\' 7"  (1.702 m)  Wt 259 lb (117.482 kg)  BMI 40.56 kg/m2  SpO2 99%  General Appearance: No distress  Neuro: EXAM: without focal findings, mental status, speech normal, alert and oriented, cranial nerves 2-12 grossly normal  HEENT: PERRLA, EOM intact, no ptosis, no other lesions noticed Pulmonary:Exam: Mild coarse upper airway sounds, no wheezing, no crackles, no rales. Good inspiratory and expiratory effort with no significant adventitious breath sounds. Sputum production: None Cardiovascular:@ Exam:  Normal S1,S2.  No m/r/g.     Abdomen:Exam: Benign, Soft, non-tender, No masses, obese abdomen  Skin:   warm, no rashes, no ecchymosis  Extremities: normal, no cyanosis, clubbing, no edema, warm with normal capillary refill.   Labs results:  BMP Lab Results  Component Value Date   NA 141 04/15/2014   K 3.7 04/15/2014   CL 106 04/15/2014   CO2 28 04/15/2014   GLUCOSE 90 04/15/2014   BUN 15 04/15/2014   CREATININE 0.9 04/15/2014     CBC CBC Latest Ref Rng 06/18/2014 11/18/2012 04/07/2011  WBC 4.0 - 10.5 K/uL 7.8 8.4 5.2  Hemoglobin 12.0 - 15.0 g/dL 16.114.4 09.613.2 04.514.2  Hematocrit 36.0 - 46.0 % 44.2 39.6 42.2  Platelets 150.0 - 400.0 K/uL 224.0 197.0 227.0     Rad results:  CT chest 06/23/2014 Mediastinum: Heart size is normal. There is no significant pericardial fluid, thickening or pericardial calcification. There is atherosclerosis of the thoracic aorta, the great vessels of the mediastinum and  the coronary arteries, including calcified atherosclerotic plaque in the left anterior descending, left circumflex and right coronary arteries. Calcifications of the mitral sub valvular apparatus. Numerous enlarged lymph nodes, measuring up to 14 mm in short axis in the low right paratracheal station. Esophagus is unremarkable in appearance.  Lungs/Pleura: Mild diffuse bronchial wall thickening. High-resolution images demonstrate some patchy areas of mild subpleural reticulation randomly distributed throughout the lungs, without a clearly definable craniocaudal gradient. There are other areas which demonstrates some mild thickening of the peribronchovascular interstitium with some associated peribronchovascular ground-glass attenuation and areas of architectural distortion, including a linear opacity in the right lower lobe, most compatible with areas of post infectious or inflammatory scarring. There is some mild nodular scarring in the right lower lobe associated with that linear opacity measuring approximately 1.3 x 0.9 cm. This is best visualized on image 51 of series 2, and is nonspecific, favored to be part of the underlying scarring. No other definite larger more suspicious appearing pulmonary nodules or masses are noted. Several tiny subpleural nodules are noted measuring up to 4 mm in the periphery of the right lower lobe (image 45 of series 6). No frank traction bronchiectasis or honeycombing. Inspiratory and expiratory imaging is unremarkable.  Upper Abdomen: Unremarkable.  Musculoskeletal: There are no aggressive appearing lytic or blastic lesions noted in the visualized portions of the skeleton.  IMPRESSION: 1. Although there are some scattered areas of very mild subpleural reticulation, these findings are nonspecific. The possibility of mild interstitial lung disease such is nonspecific interstitial pneumonia (NSIP) should be considered, but is not strongly  favored. 2. However, there is diffuse bronchial wall thickening with extensive thickening of the peribronchovascular interstitium and peribronchovascular ground-glass attenuation. There is also some mediastinal lymphadenopathy. Findings are favored to reflect an acute bronchitis, with possible early multilobar bronchopneumonia. 3. Apparent area of scarring in the right lower lobe, where there is a nodular component measuring approximately 1.4 x 0.9 cm (image 51 of series 2). This is favored to be a benign finding, however, attention on a short-term follow-up chest CT is recommended in 3 months. For re-evaluation of potential interstitial lung disease, fat chest CT should be performed as a high-resolution chest CT.     Assessment and Plan: Asthma, chronic Recurrent exacerbation over last 6 months with elevated serum eosinophils and abnormal CT  Aspergillus IgE panel neg w/ low positive titers for Asp Nidulans, Flavus and Terreus and amstel.  Will need follow up CT in future.  She has an elevated total IgE level, CT findings with mild bronchiectasis, groundglass opacities-given her history of asthma and his current lab and radiographic abnormalities there is a high suspicion for ABPA. Will further check an Aspergillus IgG, IgA, and Aspergillus skin testing. She's currently on prednisone 60 mg daily. Patient is to continue prednisone from 07/07/2014 to 07/16/2014 - 60 mg prednisone daily From 07/17/2014 to 07/31/2014-60 mg prednisone every other day From 08/01/2014 until followup with pulmonary-prednisone 40 mg daily Followup a pulmonary in one month    Acute bronchitis with bronchospasm Most likely hyperactivity to aspergillus antigen Currently on steroids.  ABPA (allergic bronchopulmonary aspergillosis) This is currently a working diagnosis, other differentials include hypersensitivity pneumonitis, eosinophilic pneumonias, NSIP, BOOP. All of these diagnoses are treated with  high-dose steroids for a prolonged period of time. She does have significant clinical improvement once steroids were initiated, with rapid decline when steroids are terminated. Further workup to include Aspergillus skin testing, IgG levels, IgA levels. Patient will require prolonged course of steroids ,possibly 3-6 months. Pulmonary will taper the steroids. Patient educated on side effects of steroids (weight gain, or retention, increased appetite, muscle weakness, bone degradation, dysfunctional calcium levels, elevated blood glucose levels, anxiety/agitation at night, decreased immune). Further followup with primary care physician for close monitoring of calcium levels and bone density scan while on high-dose steroids. Followup in one month for pulmonary for further steroid tapering and clinical evaluation  Patient educated on the current diagnosis, she understands that this is not an active infection, it is most likely a hyperreactive reaction to antigen exposure. She does not need/require antifungal treatment at this time. This is her initial treatment course, if she has refractory or recurrent symptoms will consider adding antifungals at that time.      Updated Medication List Outpatient Encounter Prescriptions as of 07/08/2014  Medication Sig  . albuterol (ACCUNEB) 0.63 MG/3ML nebulizer solution Take 1 ampule by nebulization every 6 (six) hours as needed.    Marland Kitchen albuterol (PROVENTIL HFA;VENTOLIN HFA) 108 (90 BASE) MCG/ACT inhaler Inhale 2 puffs into the lungs every 4 (four) hours as needed for wheezing.  Marland Kitchen azithromycin (ZITHROMAX Z-PAK) 250 MG tablet Take 2 pills by mouth today and then 1 pill daily for 4 days  . benzonatate (TESSALON) 200 MG capsule TAKE 1 CAPSULE (200 MG TOTAL) BY MOUTH 3 (THREE) TIMES DAILY AS NEEDED FOR COUGH (SWALLOW WHOLE).  Marland Kitchen budesonide (PULMICORT) 0.5 MG/2ML nebulizer solution Take 0.5 mg by nebulization 2 (two) times daily.  . cefdinir (OMNICEF) 300 MG capsule  Take 1 capsule (300 mg total) by mouth 2 (two) times daily.  Marland Kitchen  fexofenadine (ALLEGRA ALLERGY) 60 MG tablet Take 60 mg by mouth daily.   . Fluticasone-Salmeterol (ADVAIR DISKUS) 250-50 MCG/DOSE AEPB Inhale 1 puff into the lungs 2 (two) times daily.   . furosemide (LASIX) 20 MG tablet TAKE 1 TABLET (20 MG TOTAL) BY MOUTH DAILY.  Marland Kitchen gentamicin ointment (GARAMYCIN) 0.1 % Apply 1 application topically 2 (two) times daily.  Marland Kitchen guaiFENesin-codeine (CHERATUSSIN AC) 100-10 MG/5ML syrup Take 5 mLs by mouth every 6 (six) hours as needed for cough.  Marland Kitchen ibuprofen (ADVIL,MOTRIN) 800 MG tablet TAKE 1 TABLET BY MOUTH EVERY 8 HOURS AS NEEDED FOR JOINT PAIN. TAKE WITH FOOD, STOP IT STOMACH UPSET  . levothyroxine (SYNTHROID, LEVOTHROID) 50 MCG tablet TAKE 1 TABLET BY MOUTH EVERY DAY  . montelukast (SINGULAIR) 10 MG tablet Take 10 mg by mouth daily.    Marland Kitchen omeprazole (PRILOSEC) 20 MG capsule Take 20 mg by mouth daily.    . predniSONE (DELTASONE) 20 MG tablet Take 3 tabs with food until seen in office .  . [DISCONTINUED] HYDROcodone-homatropine (HYCODAN) 5-1.5 MG/5ML syrup Take 5 mLs by mouth every 8 (eight) hours as needed for cough.    Orders for this visit: Orders Placed This Encounter  Procedures  . IgA    Standing Status: Future     Number of Occurrences: 1     Standing Expiration Date: 07/09/2015  . IgG    Standing Status: Future     Number of Occurrences: 1     Standing Expiration Date: 07/09/2015  . Ambulatory referral to Allergy    Referral Priority:  Routine    Referral Type:  Allergy Testing    Referral Reason:  Specialty Services Required    Requested Specialty:  Allergy    Number of Visits Requested:  1    Thank  you for the visitation and for allowing  [facility] to assist in the care of your patient. Our recommendations are noted above.  Please contact us if we can be of further service.  Stephanie Acre, MD [facility] Office Number: [phone]

## 2014-07-09 LAB — IGG: IgG (Immunoglobin G), Serum: 1600 mg/dL (ref 690–1700)

## 2014-07-09 LAB — IGA: IgA: 189 mg/dL (ref 68–378)

## 2014-07-10 ENCOUNTER — Telehealth: Payer: Self-pay

## 2014-07-10 ENCOUNTER — Telehealth: Payer: Self-pay | Admitting: *Deleted

## 2014-07-10 DIAGNOSIS — B4481 Allergic bronchopulmonary aspergillosis: Secondary | ICD-10-CM

## 2014-07-10 NOTE — Telephone Encounter (Signed)
Dr. Dema SeverinMungal called me today, asking [facility].  Order placed, pt aware to have it done.  Nothing further needed.

## 2014-07-10 NOTE — Telephone Encounter (Signed)
Pt is aware to stay off antihistamines 3 days prior to appt. Pt will call the office and speak with me if any further questions or concerns. Pt was placed on Pulmonary schedule instead of allergy schedule as this is a consult.

## 2014-07-10 NOTE — Telephone Encounter (Signed)
Message copied by Ronny BaconWELCHEL, Mayrani Khamis C on Fri Jul 10, 2014  3:23 PM ------      Message from: Dorethea ClanAULFIELD, ASHLEY L      Created: Wed Jul 08, 2014  3:27 PM      Regarding: Aspergillus Skin testing       Dr. Dema SeverinMungal is requesting a skin test on this patient.  She is aware that this will take place in [place].  Referral placed stating she is already established with pulmonary- has seen VM and TP.  Thanks! ------

## 2014-07-13 ENCOUNTER — Other Ambulatory Visit (INDEPENDENT_AMBULATORY_CARE_PROVIDER_SITE_OTHER): Payer: Medicare Other

## 2014-07-13 DIAGNOSIS — B4481 Allergic bronchopulmonary aspergillosis: Secondary | ICD-10-CM

## 2014-07-15 ENCOUNTER — Ambulatory Visit (INDEPENDENT_AMBULATORY_CARE_PROVIDER_SITE_OTHER): Payer: Medicare Other | Admitting: Internal Medicine

## 2014-07-15 ENCOUNTER — Encounter: Payer: Self-pay | Admitting: Internal Medicine

## 2014-07-15 VITALS — BP 120/70 | HR 67 | Ht 67.0 in | Wt 263.2 lb

## 2014-07-15 DIAGNOSIS — B4481 Allergic bronchopulmonary aspergillosis: Secondary | ICD-10-CM | POA: Diagnosis not present

## 2014-07-15 DIAGNOSIS — Z23 Encounter for immunization: Secondary | ICD-10-CM

## 2014-07-15 LAB — QUANTIFERON TB GOLD ASSAY (BLOOD)
Interferon Gamma Release Assay: NEGATIVE
MITOGEN VALUE: 5.69 [IU]/mL
Quantiferon Nil Value: 0.01 IU/mL
Quantiferon Tb Ag Minus Nil Value: 0 IU/mL
TB Ag value: 0.01 IU/mL

## 2014-07-15 NOTE — Patient Instructions (Signed)
Aspergillus skin test was negative, with appropriate controls. That makes it less likely that your chest problems have been from allergy to the Aspergillus mold.  You can follow up with Dr Whitman HeroMungol as planned

## 2014-07-15 NOTE — Progress Notes (Signed)
Subjective:    Patient ID: Rachael Silva, female    DOB: [birth year], 68 y.o.   MRN: [id number]  HPI 07/15/14- 35 yoF never smoker with history of asthma, referred courtesy of Dr Whitman Hero asking evaluation for possible aspergillosis sensitivity/ ABPA. She has hx allergic rhinitis and acute bronchitis with bronchospasm. CXR 04/17/14- mild basial atelectasis. CT pending. Allergy profile- Total IgE 2401 with several specific elevations to common aeroallergens. Mold elevations to penicillium, alternaria, cladosporium, but not to aspergillus sp. Now on prednisone 60 mg daily started 07/08/2014. Feeling much better. Has been dealing with uncontrollable cough, shortness of breath, discolored mucus since January, 20/15. Would get temporary improvement on antibiotic plus prednisone but with flare again within 2 weeks off medication. Denies history of significant seasonal rhinitis. Few episodes of mild eczema. No recognized urticaria, food allergy or unusual reaction to insect sting.  Prior to Admission medications   Medication Sig Start Date End Date Taking? Authorizing Provider  albuterol (ACCUNEB) 0.63 MG/3ML nebulizer solution Take 1 ampule by nebulization every 6 (six) hours as needed.     Yes Historical Provider, MD  azithromycin (ZITHROMAX Z-PAK) 250 MG tablet Take 2 pills by mouth today and then 1 pill daily for 4 days 05/27/14  Yes Judy Pimple, MD  benzonatate (TESSALON) 200 MG capsule TAKE 1 CAPSULE (200 MG TOTAL) BY MOUTH 3 (THREE) TIMES DAILY AS NEEDED FOR COUGH (SWALLOW WHOLE). 07/01/14  Yes Marne A Tower, MD  budesonide (PULMICORT) 0.5 MG/2ML nebulizer solution Take 0.5 mg by nebulization 2 (two) times daily.   Yes Historical Provider, MD  fexofenadine (ALLEGRA ALLERGY) 60 MG tablet Take 60 mg by mouth daily.    Yes Historical Provider, MD  Fluticasone-Salmeterol (ADVAIR DISKUS) 250-50 MCG/DOSE AEPB Inhale 1 puff into the lungs 2 (two) times daily.    Yes Historical Provider, MD  furosemide  (LASIX) 20 MG tablet TAKE 1 TABLET (20 MG TOTAL) BY MOUTH DAILY. 06/16/14  Yes Judy Pimple, MD  gentamicin ointment (GARAMYCIN) 0.1 % Apply 1 application topically 2 (two) times daily.   Yes Historical Provider, MD  guaiFENesin-codeine (CHERATUSSIN AC) 100-10 MG/5ML syrup Take 5 mLs by mouth every 6 (six) hours as needed for cough. 07/02/14  Yes Tammy S Parrett, NP  ibuprofen (ADVIL,MOTRIN) 800 MG tablet TAKE 1 TABLET BY MOUTH EVERY 8 HOURS AS NEEDED FOR JOINT PAIN. TAKE WITH FOOD, STOP IT STOMACH UPSET 05/11/14  Yes Judy Pimple, MD  levothyroxine (SYNTHROID, LEVOTHROID) 50 MCG tablet TAKE 1 TABLET BY MOUTH EVERY DAY   Yes Marne A Tower, MD  montelukast (SINGULAIR) 10 MG tablet Take 10 mg by mouth daily.     Yes Historical Provider, MD  omeprazole (PRILOSEC) 20 MG capsule Take 20 mg by mouth daily.     Yes Historical Provider, MD  predniSONE (DELTASONE) 20 MG tablet Take 3 tabs with food until seen in office . 07/03/14  Yes Tammy S Parrett, NP  albuterol (PROVENTIL HFA;VENTOLIN HFA) 108 (90 BASE) MCG/ACT inhaler Inhale 2 puffs into the lungs every 4 (four) hours as needed for wheezing. 10/24/11 07/08/14  Judy Pimple, MD   Past Medical History  Diagnosis Date  . Allergy     allergic rhinitis  . Asthma   . Arthritis     OA of knees  . Hiatal hernia   . Hypothyroid   . Labile blood pressure   . Urinary incontinence   . Pneumonia, eosinophilic   . Angioedema 02/2006  . Frequent headaches  Past Surgical History  Procedure Laterality Date  . Cesarean section      x2  . Breast surgery  1966    breast biopsy   Family History  Problem Relation Age of Onset  . Diabetes Mother     pre-diabetic  . Heart disease Mother     ? heart disease  . Emphysema Mother    History   Social History  . Marital Status: Single    Spouse Name: N/A    Number of Children: 2  . Years of Education: N/A   Occupational History  . retired    Social History Main Topics  . Smoking status: Never Smoker     . Smokeless tobacco: Never Used  . Alcohol Use: No  . Drug Use: No  . Sexual Activity: Not on file   Other Topics Concern  . Not on file   Social History Narrative  . No narrative on file    Review of Systems  Constitutional: Negative.   Eyes: Negative.   Respiratory: Positive for cough and shortness of breath.   Cardiovascular: Positive for chest pain.  Endocrine: Negative.   Genitourinary: Negative.   Allergic/Immunologic: Negative.   Neurological: Positive for headaches.  Hematological: Negative.   Psychiatric/Behavioral: Negative.       Objective:   Physical Exam OBJ- Physical Exam General- Alert, Oriented, Affect-appropriate, Distress- none acute, overweight Skin- rash-none, lesions- none, excoriation- none Lymphadenopathy- none Head- atraumatic            Eyes- Gross vision intact, PERRLA, conjunctivae and secretions clear            Ears- Hearing, canals-normal            Nose- Clear, no-Septal dev, mucus, polyps, erosion, perforation             Throat- Mallampati II , mucosa clear , drainage- none, tonsils- atrophic Neck- flexible , trachea midline, no stridor , thyroid nl, carotid no bruit Chest - symmetrical excursion , unlabored           Heart/CV- RRR , no murmur , no gallop  , no rub, nl s1 s2                           - JVD- none , edema- none, stasis changes- none, varices- none           Lung- clear to P&A, wheeze- none, cough- none , dullness-none, rub- none           Chest wall-  Abd- tender-no, distended-no, bowel sounds-present, HSM- no Br/ Gen/ Rectal- Not done, not indicated Extrem- cyanosis- none, clubbing, none, atrophy- none, strength- nl Neuro- grossly intact to observation  Allergy Skin Test- Intradermal: Histamine control  4+ wheal and flare with pseudopods Diluent control  Negative Aspergillus  Negative    Assessment & Plan:

## 2014-07-16 NOTE — Assessment & Plan Note (Signed)
Testing looks like true negative for IgE immediate allergic reaction to aspergillus. Recognize that 60 mg prednisone may mask reaction to unknown degree, although it did not limit reaction to histamine control. Another mold or another hypersensitivity pathway might still cause the syndrome in question.

## 2014-07-22 DIAGNOSIS — J301 Allergic rhinitis due to pollen: Secondary | ICD-10-CM | POA: Diagnosis not present

## 2014-07-24 DIAGNOSIS — J301 Allergic rhinitis due to pollen: Secondary | ICD-10-CM | POA: Diagnosis not present

## 2014-07-27 ENCOUNTER — Telehealth: Payer: Self-pay | Admitting: Internal Medicine

## 2014-07-27 DIAGNOSIS — J301 Allergic rhinitis due to pollen: Secondary | ICD-10-CM | POA: Diagnosis not present

## 2014-07-27 MED ORDER — PREDNISONE 20 MG PO TABS: ORAL_TABLET | ORAL | Status: DC

## 2014-07-27 NOTE — Telephone Encounter (Signed)
Pt requesting a refill of Prednisone Rx Pt reports this is running low.  Current dose is 60mg  QOD.  Rx for Prednisone 20mg  tabs - "Take 60mg  as directed until 08/01/14 then decrease to 40mg  every other day until seen" Sent to [facility]\ Nothing further needed.

## 2014-07-30 ENCOUNTER — Other Ambulatory Visit: Payer: Self-pay | Admitting: Family Medicine

## 2014-07-30 DIAGNOSIS — J301 Allergic rhinitis due to pollen: Secondary | ICD-10-CM | POA: Diagnosis not present

## 2014-08-03 DIAGNOSIS — J301 Allergic rhinitis due to pollen: Secondary | ICD-10-CM | POA: Diagnosis not present

## 2014-08-04 DIAGNOSIS — J301 Allergic rhinitis due to pollen: Secondary | ICD-10-CM | POA: Diagnosis not present

## 2014-08-04 LAB — AFB CULTURE WITH SMEAR (NOT AT ARMC): Acid Fast Smear: NONE SEEN

## 2014-08-06 DIAGNOSIS — J301 Allergic rhinitis due to pollen: Secondary | ICD-10-CM | POA: Diagnosis not present

## 2014-08-06 LAB — AFB CULTURE WITH SMEAR (NOT AT ARMC): ACID FAST SMEAR: NONE SEEN

## 2014-08-10 DIAGNOSIS — J301 Allergic rhinitis due to pollen: Secondary | ICD-10-CM | POA: Diagnosis not present

## 2014-08-11 ENCOUNTER — Ambulatory Visit (INDEPENDENT_AMBULATORY_CARE_PROVIDER_SITE_OTHER): Payer: Medicare Other | Admitting: Internal Medicine

## 2014-08-11 ENCOUNTER — Encounter: Payer: Self-pay | Admitting: Internal Medicine

## 2014-08-11 VITALS — BP 128/70 | HR 75 | Ht 67.0 in | Wt 273.0 lb

## 2014-08-11 DIAGNOSIS — B4481 Allergic bronchopulmonary aspergillosis: Secondary | ICD-10-CM

## 2014-08-11 DIAGNOSIS — R635 Abnormal weight gain: Secondary | ICD-10-CM

## 2014-08-11 DIAGNOSIS — T50905A Adverse effect of unspecified drugs, medicaments and biological substances, initial encounter: Secondary | ICD-10-CM

## 2014-08-11 NOTE — Progress Notes (Signed)
MRN# [id number] Rachael Silva July 18, 1946   CC: Chief Complaint  Patient presents with  . Follow-up    States she is doing well on the prednisone, denies cough, sob.  Review allergy testing.        Brief History: 06/18/14 IOV /Pulmonary consult  patient is a pleasant 68 yo female with PMHx of Asthma, nasal polyps, possible sinusitis, possible Eosinophilic Pneumonia, Arthritis, seen in consultation for chronic cough.  Patient stated that she started have a cough with yellowish sputum since April of 2015, the mucus production has dissipated a little, but the color can vary from grey to yellow at times.  Over the past year she has had recurrent bout of "bronchitis" treated with multiple rounds of antibiotics and steroids; her last round as 8/19, when she received a Zpak and prednisone for 10 days for recurrent cough with productive sputum production.  She states that her bronchitis episodes last about 1 week, then she get antibiotics and steroids, her symptoms (cough, sputum production, sob), improve; however, about 5 days after completing her antibiotics\steroids her symptoms start to reoccur.  She has seen and allergist (Dr. Clelia Croft) for recurrent allergies (dust) and is on OTC allegra.  She has also seen an ENT physician (Dr. Jenne Campus), for sinusitis symptoms, who stopped her nasal steroid possible due to her nasal polyps.  Patient states that she can walk about 1/2 block before getting short of breath, and cannot climb 1 flight of stairs. She is currently on lasix for leg swelling.  Patient has cats at home. She is a never smoker. 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  Patient has been using her albuterol on a daily basis since July.    Events since last clinic visit: 07-02-14: presented to [facility] with worsening SOB\DOE, sputum production,  noted to have elevated level of IgE, high suspicion for ABPA, started on prednisone 0.5mg /kg/QD (60mg  daily). She had allergy profile testing performed, serum. Rapid improvement per patient since starting steroids.  At today's visit he states that she is rapidly improve, since starting prednisone which was given to her at her last visit to pulmonology [facility]. Patient is currently on 60 mg of prednisone daily, being treated for suspected ABPA (allergic bronchopulmonary aspergillosis). Should also like to review the results of her recent CT chest scan. Today she endorses mild dyspnea on exertion, mild cough which is nonproductive. IgE >2400, started on high dose tapering steroids at 60mg  and schedule for further aspergillus testing  07-15-14  Testing looks like true negative for IgE immediate allergic reaction to aspergillus. Recognize that 60 mg prednisone may mask reaction to unknown degree, although it did not limit reaction to histamine control. Another mold or another hypersensitivity pathway might still cause the syndrome in question.  08-11-14 Patient states that she is doing well today, no complaints of shortness of breath, dyspnea on exertion, productive cough, she does state she has a mild runny nose with clear discharge. Patient is currently on a prednisone taper, she started 40 mg daily on Friday, October 30. Patient states that since starting steroids she has gained approximately 10-12 pounds.  PMHX:   Past Medical History  Diagnosis Date  . Allergy     allergic rhinitis  . Asthma   . Arthritis     OA of knees  . Hiatal hernia   .  Hypothyroid   . Labile blood pressure   . Urinary incontinence   . Pneumonia, eosinophilic   . Angioedema 02/2006  . Frequent headaches    Surgical Hx:  Past Surgical History  Procedure Laterality Date  . Cesarean section      x2  . Breast surgery  1966    breast biopsy   Family Hx:  Family History  Problem Relation Age of Onset  .  Diabetes Mother     pre-diabetic  . Heart disease Mother     ? heart disease  . Emphysema Mother    Social Hx:   History  Substance Use Topics  . Smoking status: Never Smoker   . Smokeless tobacco: Never Used  . Alcohol Use: No   Medication:   Current Outpatient Rx  Name  Route  Sig  Dispense  Refill  . albuterol (ACCUNEB) 0.63 MG/3ML nebulizer solution   Nebulization   Take 1 ampule by nebulization every 6 (six) hours as needed.           Marland Kitchen EXPIRED: albuterol (PROVENTIL HFA;VENTOLIN HFA) 108 (90 BASE) MCG/ACT inhaler   Inhalation   Inhale 2 puffs into the lungs every 4 (four) hours as needed for wheezing.   1 Inhaler   5   . azithromycin (ZITHROMAX Z-PAK) 250 MG tablet      Take 2 pills by mouth today and then 1 pill daily for 4 days   6 tablet   0   . benzonatate (TESSALON) 200 MG capsule      TAKE 1 CAPSULE (200 MG TOTAL) BY MOUTH 3 (THREE) TIMES DAILY AS NEEDED FOR COUGH (SWALLOW WHOLE).   30 capsule   3   . budesonide (PULMICORT) 0.5 MG/2ML nebulizer solution   Nebulization   Take 0.5 mg by nebulization 2 (two) times daily.         . fexofenadine (ALLEGRA ALLERGY) 60 MG tablet   Oral   Take 60 mg by mouth daily.          . Fluticasone-Salmeterol (ADVAIR DISKUS) 250-50 MCG/DOSE AEPB   Inhalation   Inhale 1 puff into the lungs 2 (two) times daily.          . furosemide (LASIX) 20 MG tablet      TAKE 1 TABLET (20 MG TOTAL) BY MOUTH DAILY.   30 tablet   3   . gentamicin ointment (GARAMYCIN) 0.1 %   Topical   Apply 1 application topically 2 (two) times daily.         Marland Kitchen guaiFENesin-codeine (CHERATUSSIN AC) 100-10 MG/5ML syrup   Oral   Take 5 mLs by mouth every 6 (six) hours as needed for cough.   120 mL   0   . ibuprofen (ADVIL,MOTRIN) 800 MG tablet      TAKE 1 TABLET BY MOUTH EVERY 8 HOURS AS NEEDED FOR JOINT PAIN. TAKE WITH FOOD, STOP IT STOMACH UPSET   30 tablet   11   . levothyroxine (SYNTHROID, LEVOTHROID) 50 MCG tablet      TAKE  1 TABLET BY MOUTH DAILY   30 tablet   3   . montelukast (SINGULAIR) 10 MG tablet   Oral   Take 10 mg by mouth daily.           Marland Kitchen omeprazole (PRILOSEC) 20 MG capsule   Oral   Take 20 mg by mouth daily.           . predniSONE (DELTASONE) 20 MG tablet  Take 60mg  as directed until 08/01/14 then decrease to 40mg  every other day until seen   50 tablet   0      Review of Systems: Gen:  Denies  fever, sweats, chills HEENT: Denies blurred vision, double vision, ear pain, eye pain, hearing loss, nose bleeds, sore throat Cvc:  No dizziness, chest pain or heaviness Resp:   Denies cough or sputum porduction, shortness of breath Gi: Denies swallowing difficulty, stomach pain, nausea or vomiting, diarrhea, constipation, bowel incontinence Gu:  Denies bladder incontinence, burning urine Ext:   No Joint pain, stiffness.  Admits to mild lower extremity swelling Skin: No skin rash, easy bruising or bleeding or hives Endoc:  No polyuria, polydipsia , polyphagia or weight change Psych: No depression, insomnia or hallucinations  Other:  All other systems negative  Allergies:  Erythromycin  Physical Examination:  VS: BP 128/70 mmHg  Pulse 75  Ht 5\' 7"  (1.702 m)  Wt 273 lb (123.832 kg)  BMI 42.75 kg/m2  SpO2 99%  General Appearance: No distress  HEENT: PERRLA, EOM intact, no ptosis, no other lesions noticed Pulmonary:Exam: normal breath sounds., diaphragmatic excursion normal.No wheezing, No rales   Cardiovascular:@ Exam:  Normal S1,S2.  No m/r/g.     Abdomen:Exam: Benign, Soft, non-tender, No masses  Skin:   warm, no rashes, no ecchymosis  Extremities: +1 pitting edema bilateral lower extremities, no cyanosis, no clubbing, no abnormal lesions Labs results:  BMP Lab Results  Component Value Date   NA 141 04/15/2014   K 3.7 04/15/2014   CL 106 04/15/2014   CO2 28 04/15/2014   GLUCOSE 90 04/15/2014   BUN 15 04/15/2014   CREATININE 0.9 04/15/2014     CBC CBC Latest Ref  Rng 06/18/2014 11/18/2012 04/07/2011  WBC 4.0 - 10.5 K/uL 7.8 8.4 5.2  Hemoglobin 12.0 - 15.0 g/dL 16.114.4 09.613.2 04.514.2  Hematocrit 36.0 - 46.0 % 44.2 39.6 42.2  Platelets 150.0 - 400.0 K/uL 224.0 197.0 227.0     Results:  Results for Lavonia DanaMILLS, Hala (MRN [id number]) as of 08/11/2014 14:26  Ref. Range 07/02/2014 13:00 07/02/2014 13:00  [place]ia Grass No range found <0.10   [place] Grass No range found <0.10   Cat Dander No range found <0.10   Cladosporium Herbarum No range found 0.10 (H)   Common Ragweed No range found <0.10   Curvularia lunata No range found <0.10   D. farinae No range found 0.88 (H)   Dog Dander No range found 0.11 (H)   Helminthosporium halodes No range found <0.10 <0.10  Penicillium Notatum No range found 0.12 (H)   Stemphylium Botryosum No range found <0.10   Sycamore Tree No range found 0.10 (H)   Timothy Grass No range found <0.10   Candida Albicans No range found 0.13 (H)   Fescue No range found 0.10 (H)   Goldenrod No range found <0.10   Goose Feathers No range found 0.12 (H)   Sara LeeHouse Dust Hollister No range found 0.13 (H)   [place] IgE No range found <0.10   Elm IgE No range found <0.10   G005 Rye, Perennial No range found <0.10   G009 Red Top No range found <0.10   Allergen, D pternoyssinus,d7 No range found 0.48 (H)       Assessment and Plan: ABPA (allergic bronchopulmonary aspergillosis) Again this is a working diagnosis, other differentials include hypersensitivity pneumonitis, eosinophilic pneumonias, NSIP, BOOP. All of these diagnoses are treated with high-dose steroids for a prolonged period of time. She  does have significant clinical improvement once steroids were initiated, with rapid decline when steroids are terminated. For further workup with Aspergillus skin testing, IgG levels, IgA levels were within normal limits.  The results of the skin testing and immunoglobulin testing were discussed in detail with the patient, there reviewed by me personally,  and the patient was given a copy of her results. Patient will require prolonged course of steroids ,possibly 3-6 months. Pulmonary will taper the steroids. Current steroid taper is as follows: Starting October 30 prednisone 40 mg daily x2 weeks, then 40 mg every other day x2 weeks then 30 mg daily x2 weeks, then 30 mg every other day x2 weeks Patient educated on side effects of steroids (weight gain, wereretention, increased appetite, muscle weakness, bone degradation, dysfunctional calcium levels, elevated blood glucose levels, anxiety/agitation at night, decreased immune). Again patient advised to followup with primary care physician for close monitoring of calcium levels and bone density scan while on high-dose steroids. Followup in 6 weeks with pulmonary for further steroid tapering and clinical evaluation  Willcontinue to taper steroid doses to the lowest possible dose that willprovide symptomatic relief, the premises to completely taper steroids off. Given her negative Aspergillus skin testing (this was done while patient on high-dose steroids, which could also give a false negative hope), at some point in the future if steroids can be tapered off or patient has relapse of symptoms, will then consider evaluating her environment for mold testing.  Patient educated on the current diagnosis, she understands that this is not an active infection, it is most likely a hyperreactive reaction to antigen exposure. She does not need/require antifungal treatment at this time. This is her initial treatment course, if she has refractory or recurrent symptoms will consider adding antifungals at that time.  Weight gain due to medication Patient has gained 14 pounds since initiating high-dose steroids. Again she was educated on side effects of prednisone, which includes weight gain, water retention, increased appetite. Patient was again educated on diet and exercise.    Updated Medication List Outpatient  Encounter Prescriptions as of 08/11/2014  Medication Sig  . albuterol (ACCUNEB) 0.63 MG/3ML nebulizer solution Take 1 ampule by nebulization every 6 (six) hours as needed.    Marland Kitchen. albuterol (PROVENTIL HFA;VENTOLIN HFA) 108 (90 BASE) MCG/ACT inhaler Inhale 2 puffs into the lungs every 4 (four) hours as needed for wheezing.  . benzonatate (TESSALON) 200 MG capsule TAKE 1 CAPSULE (200 MG TOTAL) BY MOUTH 3 (THREE) TIMES DAILY AS NEEDED FOR COUGH (SWALLOW WHOLE).  Marland Kitchen. budesonide (PULMICORT) 0.5 MG/2ML nebulizer solution Take 0.5 mg by nebulization 2 (two) times daily.  . fexofenadine (ALLEGRA ALLERGY) 60 MG tablet Take 60 mg by mouth daily.   . Fluticasone-Salmeterol (ADVAIR DISKUS) 250-50 MCG/DOSE AEPB Inhale 1 puff into the lungs 2 (two) times daily.   . furosemide (LASIX) 20 MG tablet TAKE 1 TABLET (20 MG TOTAL) BY MOUTH DAILY.  Marland Kitchen. gentamicin ointment (GARAMYCIN) 0.1 % Apply 1 application topically 2 (two) times daily.  Marland Kitchen. guaiFENesin-codeine (CHERATUSSIN AC) 100-10 MG/5ML syrup Take 5 mLs by mouth every 6 (six) hours as needed for cough.  Marland Kitchen. ibuprofen (ADVIL,MOTRIN) 800 MG tablet TAKE 1 TABLET BY MOUTH EVERY 8 HOURS AS NEEDED FOR JOINT PAIN. TAKE WITH FOOD, STOP IT STOMACH UPSET  . levothyroxine (SYNTHROID, LEVOTHROID) 50 MCG tablet TAKE 1 TABLET BY MOUTH DAILY  . montelukast (SINGULAIR) 10 MG tablet Take 10 mg by mouth daily.    Marland Kitchen. omeprazole (PRILOSEC) 20 MG capsule Take  20 mg by mouth daily.    . predniSONE (DELTASONE) 20 MG tablet Take 60mg  as directed until 08/01/14 then decrease to 40mg  every other day until seen (Patient taking differently: Take 40 mg by mouth daily. )  . [DISCONTINUED] azithromycin (ZITHROMAX Z-PAK) 250 MG tablet Take 2 pills by mouth today and then 1 pill daily for 4 days    Orders for this visit: No orders of the defined types were placed in this encounter.    Thank  you for the visitation and for allowing  [facility] to assist in the care of your patient.  Our recommendations are noted above.  Please contact us if we can be of further service.  Stephanie Acre, MD [facility] Office Number: [phone]

## 2014-08-11 NOTE — Patient Instructions (Signed)
Take 40 mg daily for 2 weeks (until next Friday) Then 40 mg every other day for 2 weeks Then 30mg  daily for 2 weeks Then 30mg  every other day for 2 weeks  Please see your primary medical doctor before Christmas.  We will follow up in 6 weeks.

## 2014-08-11 NOTE — Assessment & Plan Note (Addendum)
Again this is a working diagnosis, other differentials include hypersensitivity pneumonitis, eosinophilic pneumonias, NSIP, BOOP. All of these diagnoses are treated with high-dose steroids for a prolonged period of time. She does have significant clinical improvement once steroids were initiated, with rapid decline when steroids are terminated. For further workup with Aspergillus skin testing, IgG levels, IgA levels were within normal limits.  The results of the skin testing and immunoglobulin testing were discussed in detail with the patient, there reviewed by me personally, and the patient was given a copy of her results. Patient will require prolonged course of steroids ,possibly 3-6 months. Pulmonary will taper the steroids. Current steroid taper is as follows: Starting October 30 prednisone 40 mg daily x2 weeks, then 40 mg every other day x2 weeks then 30 mg daily x2 weeks, then 30 mg every other day x2 weeks Patient educated on side effects of steroids (weight gain, wereretention, increased appetite, muscle weakness, bone degradation, dysfunctional calcium levels, elevated blood glucose levels, anxiety/agitation at night, decreased immune). Again patient advised to followup with primary care physician for close monitoring of calcium levels and bone density scan while on high-dose steroids. Followup in 6 weeks with pulmonary for further steroid tapering and clinical evaluation  Willcontinue to taper steroid doses to the lowest possible dose that willprovide symptomatic relief, the premises to completely taper steroids off. Given her negative Aspergillus skin testing (this was done while patient on high-dose steroids, which could also give a false negative hope), at some point in the future if steroids can be tapered off or patient has relapse of symptoms, will then consider evaluating her environment for mold testing.  Patient educated on the current diagnosis, she understands that this is not an  active infection, it is most likely a hyperreactive reaction to antigen exposure. She does not need/require antifungal treatment at this time. This is her initial treatment course, if she has refractory or recurrent symptoms will consider adding antifungals at that time.

## 2014-08-11 NOTE — Assessment & Plan Note (Signed)
Patient has gained 14 pounds since initiating high-dose steroids. Again she was educated on side effects of prednisone, which includes weight gain, water retention, increased appetite. Patient was again educated on diet and exercise.

## 2014-08-13 DIAGNOSIS — J301 Allergic rhinitis due to pollen: Secondary | ICD-10-CM | POA: Diagnosis not present

## 2014-08-17 DIAGNOSIS — J301 Allergic rhinitis due to pollen: Secondary | ICD-10-CM | POA: Diagnosis not present

## 2014-08-20 DIAGNOSIS — J301 Allergic rhinitis due to pollen: Secondary | ICD-10-CM | POA: Diagnosis not present

## 2014-08-24 DIAGNOSIS — J301 Allergic rhinitis due to pollen: Secondary | ICD-10-CM | POA: Diagnosis not present

## 2014-08-25 ENCOUNTER — Telehealth: Payer: Self-pay | Admitting: Internal Medicine

## 2014-08-25 DIAGNOSIS — J301 Allergic rhinitis due to pollen: Secondary | ICD-10-CM | POA: Diagnosis not present

## 2014-08-25 NOTE — Telephone Encounter (Signed)
If patient is not having fever, body aches, etc, then this is most likely related to cold weather. She can use nasal saline spray if needed.

## 2014-08-25 NOTE — Telephone Encounter (Signed)
Called and spoke to pt. Informed pt of the recs per VM. Pt verbalized understanding and denied any further questions or concerns at this time.

## 2014-08-25 NOTE — Telephone Encounter (Signed)
lmtcb for pt.  

## 2014-08-25 NOTE — Telephone Encounter (Signed)
See other phone note from 08/25/14. Nothing further needed. Will sign off.

## 2014-08-25 NOTE — Telephone Encounter (Signed)
Called and spoke to Rachael Silva. Rachael Silva c/o mild cough with little mucus production - yellow in color, PND in morning and stuffy nose. Rachael Silva states at time she has blood tinged mucus when blowing her nose. Rachael Silva denies SOB, CP/tightness, f/c/s. Rachael Silva denies taking anything OTC. Rachael Silva requesting recs by Dr. Dema SeverinMungal. Advised Rachael Silva we will send message to Dr. Dema SeverinMungal and once he responds we will call her back. Rachael Silva verbalized understanding. Rachael Silva last seen by VM on 08/11/14.  Dr. Dema SeverinMungal, please advise.   Allergies  Allergen Reactions  . Erythromycin     REACTION: feels sick

## 2014-08-27 ENCOUNTER — Telehealth: Payer: Self-pay | Admitting: Family Medicine

## 2014-08-27 DIAGNOSIS — T380X5S Adverse effect of glucocorticoids and synthetic analogues, sequela: Secondary | ICD-10-CM

## 2014-08-27 DIAGNOSIS — T380X5A Adverse effect of glucocorticoids and synthetic analogues, initial encounter: Secondary | ICD-10-CM

## 2014-08-27 DIAGNOSIS — Z7952 Long term (current) use of systemic steroids: Secondary | ICD-10-CM

## 2014-08-27 DIAGNOSIS — J301 Allergic rhinitis due to pollen: Secondary | ICD-10-CM | POA: Diagnosis not present

## 2014-08-27 DIAGNOSIS — E2839 Other primary ovarian failure: Secondary | ICD-10-CM | POA: Insufficient documentation

## 2014-08-27 HISTORY — DX: Adverse effect of glucocorticoids and synthetic analogues, initial encounter: T38.0X5A

## 2014-08-27 HISTORY — DX: Long term (current) use of systemic steroids: Z79.52

## 2014-08-27 NOTE — Telephone Encounter (Signed)
Lab appt scheduled for tomorrow, please put in orders.  Pt does agree to get a dexa, I advise pt Marion/Linda will call to schedule appt., please put in referral

## 2014-08-27 NOTE — Telephone Encounter (Signed)
Pt called stated she is taking prednisone and she stated she needs a labs but wasn't for sure what the labs were.  She stated her pulmonary dr told her to contact our office to set up the lab appointment.  Is it ok to schedule??

## 2014-08-27 NOTE — Telephone Encounter (Signed)
Due to the steroids -they want me to check calcium level and blood sugar -can be changed by the prednisone  It would also be wise to refer her for a bone density test is she is open to it  (to get a baseline as the steroids can also decrease bone density)  Let me know if she wants dexa ordered along with labs and I will do the orders

## 2014-08-28 ENCOUNTER — Other Ambulatory Visit (INDEPENDENT_AMBULATORY_CARE_PROVIDER_SITE_OTHER): Payer: Medicare Other

## 2014-08-28 DIAGNOSIS — Z7952 Long term (current) use of systemic steroids: Secondary | ICD-10-CM

## 2014-08-28 LAB — GLUCOSE, RANDOM: Glucose, Bld: 92 mg/dL (ref 70–99)

## 2014-08-28 LAB — HEMOGLOBIN A1C: Hgb A1c MFr Bld: 6.6 % — ABNORMAL HIGH (ref 4.6–6.5)

## 2014-08-28 LAB — CALCIUM: CALCIUM: 9.1 mg/dL (ref 8.4–10.5)

## 2014-08-31 DIAGNOSIS — J301 Allergic rhinitis due to pollen: Secondary | ICD-10-CM | POA: Diagnosis not present

## 2014-09-01 ENCOUNTER — Encounter: Payer: Self-pay | Admitting: *Deleted

## 2014-09-07 DIAGNOSIS — J301 Allergic rhinitis due to pollen: Secondary | ICD-10-CM | POA: Diagnosis not present

## 2014-09-10 DIAGNOSIS — J301 Allergic rhinitis due to pollen: Secondary | ICD-10-CM | POA: Diagnosis not present

## 2014-09-14 DIAGNOSIS — J301 Allergic rhinitis due to pollen: Secondary | ICD-10-CM | POA: Diagnosis not present

## 2014-09-16 ENCOUNTER — Telehealth: Payer: Self-pay | Admitting: Internal Medicine

## 2014-09-16 MED ORDER — PREDNISONE 20 MG PO TABS: ORAL_TABLET | ORAL | Status: DC

## 2014-09-16 NOTE — Telephone Encounter (Signed)
Called and spoke to pt. Pt requesting refill of pred. Pt was given directions at last OV on 08/11/14 for pred taper. Refill sent to preferred pharmacy. Pt has appt on 12/16 with VM. Pt verbalized understanding and denied any further questions or concerns at this time.

## 2014-09-17 DIAGNOSIS — J301 Allergic rhinitis due to pollen: Secondary | ICD-10-CM | POA: Diagnosis not present

## 2014-09-21 DIAGNOSIS — J301 Allergic rhinitis due to pollen: Secondary | ICD-10-CM | POA: Diagnosis not present

## 2014-09-23 ENCOUNTER — Encounter: Payer: Self-pay | Admitting: Internal Medicine

## 2014-09-23 ENCOUNTER — Ambulatory Visit (INDEPENDENT_AMBULATORY_CARE_PROVIDER_SITE_OTHER): Payer: Medicare Other | Admitting: Internal Medicine

## 2014-09-23 VITALS — BP 128/78 | HR 72 | Ht 67.0 in | Wt 265.0 lb

## 2014-09-23 DIAGNOSIS — R635 Abnormal weight gain: Secondary | ICD-10-CM | POA: Diagnosis not present

## 2014-09-23 DIAGNOSIS — B4481 Allergic bronchopulmonary aspergillosis: Secondary | ICD-10-CM

## 2014-09-23 DIAGNOSIS — T50905A Adverse effect of unspecified drugs, medicaments and biological substances, initial encounter: Secondary | ICD-10-CM

## 2014-09-23 NOTE — Assessment & Plan Note (Signed)
Patient lost 8 pounds since last visit, Hba1c = 6.6 Again she was educated on side effects of prednisone, which includes weight gain, water retention, increased appetite. Patient was again educated on diet and exercise.

## 2014-09-23 NOTE — Progress Notes (Signed)
MRN# [id number] Rachael Silva 1946/09/26   CC: Chief Complaint  Patient presents with  . Follow-up      Brief History: 06/18/14 IOV /Pulmonary consult  patient is a pleasant 68 yo female with PMHx of Asthma, nasal polyps, possible sinusitis, possible Eosinophilic Pneumonia, Arthritis, seen in consultation for chronic cough.  Patient stated that she started have a cough with yellowish sputum since April of 2015, the mucus production has dissipated a little, but the color can vary from grey to yellow at times.  Over the past year she has had recurrent bout of "bronchitis" treated with multiple rounds of antibiotics and steroids; her last round as 8/19, when she received a Zpak and prednisone for 10 days for recurrent cough with productive sputum production.  She states that her bronchitis episodes last about 1 week, then she get antibiotics and steroids, her symptoms (cough, sputum production, sob), improve; however, about 5 days after completing her antibiotics\steroids her symptoms start to reoccur.  She has seen and allergist (Dr. Clelia CroftShaw) for recurrent allergies (dust) and is on OTC allegra.  She has also seen an ENT physician (Dr. Jenne CampusMcQueen), for sinusitis symptoms, who stopped her nasal steroid possible due to her nasal polyps.  Patient states that she can walk about 1/2 block before getting short of breath, and cannot climb 1 flight of stairs. She is currently on lasix for leg swelling.  Patient has cats at home. She is a never smoker. 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  Patient has been using her albuterol on a daily basis since July.   07-02-14: presented to [facility] with worsening SOB\DOE, sputum production, noted to have elevated level of IgE, high suspicion for ABPA, started on prednisone 0.5mg /kg/QD (60mg  daily). She had allergy  profile testing performed, serum. Rapid improvement per patient since starting steroids.  At today's visit he states that she is rapidly improve, since starting prednisone which was given to her at [facility]. Patient is currently on 60 mg of prednisone daily, being treated for suspected ABPA (allergic bronchopulmonary aspergillosis). Should also like to review the results of her recent CT chest scan. Today she endorses mild dyspnea on exertion, mild cough which is nonproductive. IgE >2400, started on high dose tapering steroids at 60mg  and schedule for further aspergillus testing  07-15-14  Testing looks like true negative for IgE immediate allergic reaction to aspergillus. Recognize that 60 mg prednisone may mask reaction to unknown degree, although it did not limit reaction to histamine control. Another mold or another hypersensitivity pathway might still cause the syndrome in question.  08-11-14 Patient states that she is doing well today, no complaints of shortness of breath, dyspnea on exertion, productive cough, she does state she has a mild runny nose with clear discharge. Patient is currently on a prednisone taper, she started 40 mg daily on Friday, October 30. Patient states that since starting steroids she has gained approximately 10-12 pounds.   Events since last clinic visit: 09-23-14 Patient states that she is doing better, no major complaints of sob, doe.  She does have mild productive cough since switching to prednisone 30mg  every other day, but she believes this is due to the weather causing her allergies to act up. Cough with mild yellowish sputum production, in the mornings. She has lost 8 lbs since  last visit Has bone density schedule for Jan 2016.        PMHX:   Past Medical History  Diagnosis Date  . Allergy     allergic rhinitis  . Asthma   . Arthritis     OA of knees  . Hiatal hernia   . Hypothyroid   . Labile blood pressure    . Urinary incontinence   . Pneumonia, eosinophilic   . Angioedema 02/2006  . Frequent headaches    Surgical Hx:  Past Surgical History  Procedure Laterality Date  . Cesarean section      x2  . Breast surgery  1966    breast biopsy   Family Hx:  Family History  Problem Relation Age of Onset  . Diabetes Mother     pre-diabetic  . Heart disease Mother     ? heart disease  . Emphysema Mother    Social Hx:   History  Substance Use Topics  . Smoking status: Never Smoker   . Smokeless tobacco: Never Used  . Alcohol Use: No   Medication:   Current Outpatient Rx  Name  Route  Sig  Dispense  Refill  . albuterol (ACCUNEB) 0.63 MG/3ML nebulizer solution   Nebulization   Take 1 ampule by nebulization every 6 (six) hours as needed.           Marland Kitchen. EXPIRED: albuterol (PROVENTIL HFA;VENTOLIN HFA) 108 (90 BASE) MCG/ACT inhaler   Inhalation   Inhale 2 puffs into the lungs every 4 (four) hours as needed for wheezing.   1 Inhaler   5   . benzonatate (TESSALON) 200 MG capsule      TAKE 1 CAPSULE (200 MG TOTAL) BY MOUTH 3 (THREE) TIMES DAILY AS NEEDED FOR COUGH (SWALLOW WHOLE).   30 capsule   3   . budesonide (PULMICORT) 0.5 MG/2ML nebulizer solution   Nebulization   Take 0.5 mg by nebulization 2 (two) times daily.         . fexofenadine (ALLEGRA ALLERGY) 60 MG tablet   Oral   Take 60 mg by mouth daily.          . Fluticasone-Salmeterol (ADVAIR DISKUS) 250-50 MCG/DOSE AEPB   Inhalation   Inhale 1 puff into the lungs 2 (two) times daily.          . furosemide (LASIX) 20 MG tablet      TAKE 1 TABLET (20 MG TOTAL) BY MOUTH DAILY.   30 tablet   3   . gentamicin ointment (GARAMYCIN) 0.1 %   Topical   Apply 1 application topically 2 (two) times daily.         Marland Kitchen. guaiFENesin-codeine (CHERATUSSIN AC) 100-10 MG/5ML syrup   Oral   Take 5 mLs by mouth every 6 (six) hours as needed for cough.   120 mL   0   . ibuprofen (ADVIL,MOTRIN) 800 MG tablet      TAKE 1 TABLET  BY MOUTH EVERY 8 HOURS AS NEEDED FOR JOINT PAIN. TAKE WITH FOOD, STOP IT STOMACH UPSET   30 tablet   11   . levothyroxine (SYNTHROID, LEVOTHROID) 50 MCG tablet      TAKE 1 TABLET BY MOUTH DAILY   30 tablet   3   . montelukast (SINGULAIR) 10 MG tablet   Oral   Take 10 mg by mouth daily.           Marland Kitchen. omeprazole (PRILOSEC) 20 MG capsule   Oral   Take 20 mg by  mouth daily.           . predniSONE (DELTASONE) 20 MG tablet      Take 30mg  every other day   10 tablet   0      Review of Systems: Gen:  Denies  fever, sweats, chills HEENT: Denies blurred vision, double vision, ear pain, eye pain, hearing loss, nose bleeds, sore throat Cvc:  No dizziness, chest pain or heaviness Resp:   Mild productive cough (yellowish), mild sob with exertion Gi: Denies swallowing difficulty, stomach pain, nausea or vomiting, diarrhea, constipation, bowel incontinence Gu:  Denies bladder incontinence, burning urine Ext:   No Joint pain, stiffness or swelling Skin: No skin rash, easy bruising or bleeding or hives Endoc:  No polyuria, polydipsia , polyphagia or weight change Psych: No depression, insomnia or hallucinations  Other:  All other systems negative  Allergies:  Erythromycin  Physical Examination:  VS: BP 128/78 mmHg  Pulse 72  Ht 5\' 7"  (1.702 m)  Wt 265 lb (120.203 kg)  BMI 41.50 kg/m2  SpO2 96%  General Appearance: No distress  Neuro: EXAM: without focal findings, mental status, speech normal, alert and oriented, cranial nerves 2-12 grossly normal  HEENT: PERRLA, EOM intact, no ptosis, no other lesions noticed Pulmonary:Exam: normal breath sounds., diaphragmatic excursion normal.No wheezing, No rales   Cardiovascular:@ Exam:  Normal S1,S2.  No m/r/g.     Abdomen:Exam: Benign, Soft, non-tender, No masses  Skin:   warm, no rashes, no ecchymosis  Extremities: normal, no cyanosis, clubbing, warm with normal capillary refill.  Trace pitting edema in right ankle Labs results:   BMP Lab Results  Component Value Date   NA 141 04/15/2014   K 3.7 04/15/2014   CL 106 04/15/2014   CO2 28 04/15/2014   GLUCOSE 92 08/28/2014   BUN 15 04/15/2014   CREATININE 0.9 04/15/2014     CBC CBC Latest Ref Rng 06/18/2014 11/18/2012 04/07/2011  WBC 4.0 - 10.5 K/uL 7.8 8.4 5.2  Hemoglobin 12.0 - 15.0 g/dL 96.0 45.4 09.8  Hematocrit 36.0 - 46.0 % 44.2 39.6 42.2  Platelets 150.0 - 400.0 K/uL 224.0 197.0 227.0       Assessment and Plan: ABPA (allergic bronchopulmonary aspergillosis) Again this is a working diagnosis, other differentials include hypersensitivity pneumonitis, eosinophilic pneumonias, NSIP, BOOP. All of these diagnoses are treated with high-dose steroids for a prolonged period of time. She does have significant clinical improvement once steroids were initiated, with rapid decline when steroids are terminated. For further workup with Aspergillus skin testing, IgG levels, IgA levels were within normal limits.  The results of the skin testing and immunoglobulin testing were discussed in detail with the patient, there reviewed by me personally, and the patient was given a copy of her results. Patient will require prolonged course of steroids ,possibly 3-6 months. Pulmonary will taper the steroids. Current steroid taper is as follows: Starting September 28, 2014 prednisone 25 mg daily x2 weeks, then 25 mg every other day x2 weeks then 20 mg daily x2 weeks, then 20 mg every other day x2 weeks, then 15mg  daily x 2weeks, then 15mg  every other day x 2wks Patient educated on side effects of steroids (weight gain, wereretention, increased appetite, muscle weakness, bone degradation, dysfunctional calcium levels, elevated blood glucose levels, anxiety/agitation at night, decreased immune). Again patient advised to followup with primary care physician for close monitoring of calcium levels and bone density scan while on high-dose steroids. Followup in 6 weeks with pulmonary for  further steroid tapering  and clinical evaluation  Willcontinue to taper steroid doses to the lowest possible dose that willprovide symptomatic relief, the premises to completely taper steroids off. Given her negative Aspergillus skin testing (this was done while patient on high-dose steroids, which could also give a false negative hope), at some point in the future if steroids can be tapered off or patient has relapse of symptoms, will then consider evaluating her environment for mold testing.  Patient educated on the current diagnosis, she understands that this is not an active infection, it is most likely a hyperreactive reaction to antigen exposure. She does not need/require antifungal treatment at this time. This is her initial treatment course, if she has refractory or recurrent symptoms will consider adding antifungals at that time.    Weight gain due to medication Patient lost 8 pounds since last visit, Hba1c = 6.6 Again she was educated on side effects of prednisone, which includes weight gain, water retention, increased appetite. Patient was again educated on diet and exercise.      Updated Medication List Outpatient Encounter Prescriptions as of 09/23/2014  Medication Sig  . albuterol (ACCUNEB) 0.63 MG/3ML nebulizer solution Take 1 ampule by nebulization every 6 (six) hours as needed.    Marland Kitchen albuterol (PROVENTIL HFA;VENTOLIN HFA) 108 (90 BASE) MCG/ACT inhaler Inhale 2 puffs into the lungs every 4 (four) hours as needed for wheezing.  . benzonatate (TESSALON) 200 MG capsule TAKE 1 CAPSULE (200 MG TOTAL) BY MOUTH 3 (THREE) TIMES DAILY AS NEEDED FOR COUGH (SWALLOW WHOLE).  Marland Kitchen budesonide (PULMICORT) 0.5 MG/2ML nebulizer solution Take 0.5 mg by nebulization 2 (two) times daily.  . fexofenadine (ALLEGRA ALLERGY) 60 MG tablet Take 60 mg by mouth daily.   . Fluticasone-Salmeterol (ADVAIR DISKUS) 250-50 MCG/DOSE AEPB Inhale 1 puff into the lungs 2 (two) times daily.   . furosemide (LASIX) 20  MG tablet TAKE 1 TABLET (20 MG TOTAL) BY MOUTH DAILY.  Marland Kitchen gentamicin ointment (GARAMYCIN) 0.1 % Apply 1 application topically 2 (two) times daily.  Marland Kitchen guaiFENesin-codeine (CHERATUSSIN AC) 100-10 MG/5ML syrup Take 5 mLs by mouth every 6 (six) hours as needed for cough.  Marland Kitchen ibuprofen (ADVIL,MOTRIN) 800 MG tablet TAKE 1 TABLET BY MOUTH EVERY 8 HOURS AS NEEDED FOR JOINT PAIN. TAKE WITH FOOD, STOP IT STOMACH UPSET  . levothyroxine (SYNTHROID, LEVOTHROID) 50 MCG tablet TAKE 1 TABLET BY MOUTH DAILY  . montelukast (SINGULAIR) 10 MG tablet Take 10 mg by mouth daily.    Marland Kitchen omeprazole (PRILOSEC) 20 MG capsule Take 20 mg by mouth daily.    . predniSONE (DELTASONE) 20 MG tablet Take 30mg  every other day    Orders for this visit: No orders of the defined types were placed in this encounter.    Thank  you for the visitation and for allowing  [facility] to assist in the care of your patient. Our recommendations are noted above.  Please contact us if we can be of further service.  Stephanie Acre, MD [facility] Office Number: [phone]

## 2014-09-23 NOTE — Assessment & Plan Note (Signed)
Again this is a working diagnosis, other differentials include hypersensitivity pneumonitis, eosinophilic pneumonias, NSIP, BOOP. All of these diagnoses are treated with high-dose steroids for a prolonged period of time. She does have significant clinical improvement once steroids were initiated, with rapid decline when steroids are terminated. For further workup with Aspergillus skin testing, IgG levels, IgA levels were within normal limits.  The results of the skin testing and immunoglobulin testing were discussed in detail with the patient, there reviewed by me personally, and the patient was given a copy of her results. Patient will require prolonged course of steroids ,possibly 3-6 months. Pulmonary will taper the steroids. Current steroid taper is as follows: Starting September 28, 2014 prednisone 25 mg daily x2 weeks, then 25 mg every other day x2 weeks then 20 mg daily x2 weeks, then 20 mg every other day x2 weeks, then 15mg  daily x 2weeks, then 15mg  every other day x 2wks Patient educated on side effects of steroids (weight gain, wereretention, increased appetite, muscle weakness, bone degradation, dysfunctional calcium levels, elevated blood glucose levels, anxiety/agitation at night, decreased immune). Again patient advised to followup with primary care physician for close monitoring of calcium levels and bone density scan while on high-dose steroids. Followup in 6 weeks with pulmonary for further steroid tapering and clinical evaluation  Willcontinue to taper steroid doses to the lowest possible dose that willprovide symptomatic relief, the premises to completely taper steroids off. Given her negative Aspergillus skin testing (this was done while patient on high-dose steroids, which could also give a false negative hope), at some point in the future if steroids can be tapered off or patient has relapse of symptoms, will then consider evaluating her environment for mold testing.  Patient  educated on the current diagnosis, she understands that this is not an active infection, it is most likely a hyperreactive reaction to antigen exposure. She does not need/require antifungal treatment at this time. This is her initial treatment course, if she has refractory or recurrent symptoms will consider adding antifungals at that time.

## 2014-09-23 NOTE — Patient Instructions (Signed)
Please continue Prednisone as directed. 25 mg for 2 weeks then 25 mg every other day for 2 weeks. 20 mg for 2 weeks then 20 mg every other day for 2 weeks. 15 mg for 2 weeks then 15 mg every other day for 2 weeks. We will see you again in weeks.

## 2014-09-25 ENCOUNTER — Telehealth: Payer: Self-pay | Admitting: Internal Medicine

## 2014-09-25 MED ORDER — PREDNISONE 10 MG PO TABS: 10.0000 mg | ORAL_TABLET | Freq: Every day | ORAL | Status: DC

## 2014-09-25 NOTE — Telephone Encounter (Signed)
Rx for prednisone was sent to pharm  Pt aware  Nothing further needed

## 2014-09-28 DIAGNOSIS — J301 Allergic rhinitis due to pollen: Secondary | ICD-10-CM | POA: Diagnosis not present

## 2014-10-01 DIAGNOSIS — J301 Allergic rhinitis due to pollen: Secondary | ICD-10-CM | POA: Diagnosis not present

## 2014-10-05 DIAGNOSIS — J301 Allergic rhinitis due to pollen: Secondary | ICD-10-CM | POA: Diagnosis not present

## 2014-10-08 DIAGNOSIS — J301 Allergic rhinitis due to pollen: Secondary | ICD-10-CM | POA: Diagnosis not present

## 2014-10-10 ENCOUNTER — Other Ambulatory Visit: Payer: Self-pay | Admitting: Family Medicine

## 2014-10-12 DIAGNOSIS — J301 Allergic rhinitis due to pollen: Secondary | ICD-10-CM | POA: Diagnosis not present

## 2014-10-13 ENCOUNTER — Ambulatory Visit: Payer: Self-pay | Admitting: Family Medicine

## 2014-10-13 ENCOUNTER — Encounter: Payer: Self-pay | Admitting: Family Medicine

## 2014-10-13 DIAGNOSIS — Z13820 Encounter for screening for osteoporosis: Secondary | ICD-10-CM | POA: Diagnosis not present

## 2014-10-13 DIAGNOSIS — E2839 Other primary ovarian failure: Secondary | ICD-10-CM | POA: Diagnosis not present

## 2014-10-13 NOTE — Telephone Encounter (Signed)
done

## 2014-10-13 NOTE — Telephone Encounter (Signed)
Please refill for 6 mo 

## 2014-10-13 NOTE — Telephone Encounter (Signed)
Electronic refill request, no recent/future appt., please advise  

## 2014-10-14 ENCOUNTER — Other Ambulatory Visit: Payer: Self-pay | Admitting: Internal Medicine

## 2014-10-14 ENCOUNTER — Telehealth: Payer: Self-pay | Admitting: Internal Medicine

## 2014-10-14 MED ORDER — PREDNISONE 5 MG PO TABS: ORAL_TABLET | ORAL | Status: DC

## 2014-10-14 NOTE — Telephone Encounter (Signed)
Pt has sent a MyChart message about this. I addressed her message. I attempted to call her to let her know but she did not answer.

## 2014-10-15 DIAGNOSIS — J301 Allergic rhinitis due to pollen: Secondary | ICD-10-CM | POA: Diagnosis not present

## 2014-10-21 DIAGNOSIS — J301 Allergic rhinitis due to pollen: Secondary | ICD-10-CM | POA: Diagnosis not present

## 2014-10-28 DIAGNOSIS — J301 Allergic rhinitis due to pollen: Secondary | ICD-10-CM | POA: Diagnosis not present

## 2014-11-02 ENCOUNTER — Other Ambulatory Visit: Payer: Self-pay | Admitting: Family Medicine

## 2014-11-03 ENCOUNTER — Other Ambulatory Visit: Payer: Self-pay | Admitting: Family Medicine

## 2014-11-04 ENCOUNTER — Ambulatory Visit: Payer: Medicare Other | Admitting: Internal Medicine

## 2014-11-04 DIAGNOSIS — J301 Allergic rhinitis due to pollen: Secondary | ICD-10-CM | POA: Diagnosis not present

## 2014-11-11 ENCOUNTER — Ambulatory Visit (INDEPENDENT_AMBULATORY_CARE_PROVIDER_SITE_OTHER): Payer: Medicare Other | Admitting: Family Medicine

## 2014-11-11 ENCOUNTER — Encounter: Payer: Self-pay | Admitting: Family Medicine

## 2014-11-11 VITALS — BP 128/72 | HR 80 | Temp 98.1°F | Ht 67.0 in | Wt 272.8 lb

## 2014-11-11 DIAGNOSIS — T380X5S Adverse effect of glucocorticoids and synthetic analogues, sequela: Secondary | ICD-10-CM

## 2014-11-11 DIAGNOSIS — B4481 Allergic bronchopulmonary aspergillosis: Secondary | ICD-10-CM

## 2014-11-11 DIAGNOSIS — E099 Drug or chemical induced diabetes mellitus without complications: Secondary | ICD-10-CM | POA: Insufficient documentation

## 2014-11-11 DIAGNOSIS — R739 Hyperglycemia, unspecified: Secondary | ICD-10-CM | POA: Diagnosis not present

## 2014-11-11 DIAGNOSIS — T380X5A Adverse effect of glucocorticoids and synthetic analogues, initial encounter: Secondary | ICD-10-CM

## 2014-11-11 MED ORDER — METFORMIN HCL 500 MG PO TABS: 250.0000 mg | ORAL_TABLET | Freq: Two times a day (BID) | ORAL | Status: DC

## 2014-11-11 NOTE — Assessment & Plan Note (Signed)
dexa was re assuring with nl BMD in LS and FN  Will watch this every 2 years Disc ca and D Ca level nl  Disc imp of exercise  Hopefully will not be on steriods lifelong   Hyperglycemia is mild A1C due after feb 20 Will begin metformin 250 bid and watch diet carefully Disc poss side eff- will update

## 2014-11-11 NOTE — Patient Instructions (Signed)
Your bone density is good - we will want to re check the bone density test in 2 years  Make a lab appt after feb 20th for A1C Keep watching sugar in diet and keep exercising  Continue your calcium and vit D   (Try to get 1200-1500 mg of calcium per day with at least 1000 iu of vitamin D - for bone health )  Let's start a small dose of metformin for blood sugar control-this may also help you loose weight

## 2014-11-11 NOTE — Progress Notes (Signed)
Pre visit review using our clinic review tool, if applicable. No additional management support is needed unless otherwise documented below in the visit note. 

## 2014-11-11 NOTE — Assessment & Plan Note (Signed)
Clinically improved with prednisone - she is tapering currently

## 2014-11-11 NOTE — Progress Notes (Signed)
Subjective:    Patient ID: Rachael Silva, female    DOB: 31-Jul-1946, 69 y.o.   MRN: [id number]  HPI Here for primary care f/u for being on high dose steroids for ABPA  On prednisone from pulmonary- 20 mg every other day  Monday she goes down to 15 mg  Helps a lot    Has had all the side effects  She has to "run away from the kitchen"- stays very hungry  She is trying to stay away from sugar however   Wt is up 7lb from dec  (after loosing weight and making a real effort)  bmi of 42 Lab Results  Component Value Date   HGBA1C 6.6* 08/28/2014  no excessive thirst or urination  She does drink a lot of water by habit   Started back at the gym - she mainly uses the elliptical and the bicycle   Glucose fasting was ok   dexa 1/5 T score the SL 1.0  T score FN 0.2  Both in the normal  Pleased with that  Want to keep an eye on it   Ca was nl at 9.1   Patient Active Problem List   Diagnosis Date Noted  . Glucocorticoids and synthetic analogues causing adverse effect in therapeutic use 08/27/2014  . Long term current use of systemic steroids 08/27/2014  . Estrogen deficiency 08/27/2014  . ABPA (allergic bronchopulmonary aspergillosis) 07/08/2014  . Cough 06/18/2014  . Asthma, chronic 06/18/2014  . SOB (shortness of breath) 06/18/2014  . Productive cough 05/27/2014  . Bursitis, shoulder 12/09/2013  . Acute bronchitis with bronchospasm 05/23/2013  . Abnormal mammogram with microcalcification 06/06/2011  . Other screening mammogram 04/17/2011  . Routine general medical examination at a health care facility 04/17/2011  . HYPOTHYROIDISM 06/01/2009  . EDEMA 03/03/2009  . SINUSITIS, CHRONIC 08/18/2008  . ARTHRITIS, RIGHT KNEE 04/15/2008  . Weight gain due to medication 02/18/2008  . HOT FLASHES 02/18/2008  . GERD 08/01/2007  . HYPERCHOLESTEROLEMIA 05/15/2007  . ALLERGIC RHINITIS 05/15/2007  . ASTHMA 05/15/2007  . PERIODONTAL DISEASE 05/15/2007  . MURMUR 05/15/2007  .  URINARY INCONTINENCE 05/15/2007   Past Medical History  Diagnosis Date  . Allergy     allergic rhinitis  . Asthma   . Arthritis     OA of knees  . Hiatal hernia   . Hypothyroid   . Labile blood pressure   . Urinary incontinence   . Pneumonia, eosinophilic   . Angioedema 02/2006  . Frequent headaches    Past Surgical History  Procedure Laterality Date  . Cesarean section      x2  . Breast surgery  1966    breast biopsy   History  Substance Use Topics  . Smoking status: Never Smoker   . Smokeless tobacco: Never Used  . Alcohol Use: No   Family History  Problem Relation Age of Onset  . Diabetes Mother     pre-diabetic  . Heart disease Mother     ? heart disease  . Emphysema Mother    Allergies  Allergen Reactions  . Erythromycin     REACTION: feels sick   Current Outpatient Prescriptions on File Prior to Visit  Medication Sig Dispense Refill  . albuterol (ACCUNEB) 0.63 MG/3ML nebulizer solution Take 1 ampule by nebulization every 6 (six) hours as needed.      . budesonide (PULMICORT) 0.5 MG/2ML nebulizer solution Take 0.5 mg by nebulization 2 (two) times daily.    . fexofenadine (ALLEGRA ALLERGY) 60  MG tablet Take 60 mg by mouth daily.     . Fluticasone-Salmeterol (ADVAIR DISKUS) 250-50 MCG/DOSE AEPB Inhale 1 puff into the lungs 2 (two) times daily.     . furosemide (LASIX) 20 MG tablet TAKE 1 TABLET (20 MG TOTAL) BY MOUTH DAILY. 30 tablet 5  . furosemide (LASIX) 20 MG tablet TAKE 1 TABLET (20 MG TOTAL) BY MOUTH DAILY. 30 tablet 1  . gentamicin ointment (GARAMYCIN) 0.1 % Apply 1 application topically 2 (two) times daily.    Marland Kitchen guaiFENesin-codeine (CHERATUSSIN AC) 100-10 MG/5ML syrup Take 5 mLs by mouth every 6 (six) hours as needed for cough. 120 mL 0  . ibuprofen (ADVIL,MOTRIN) 800 MG tablet TAKE 1 TABLET BY MOUTH EVERY 8 HOURS AS NEEDED FOR JOINT PAIN. TAKE WITH FOOD, STOP IT STOMACH UPSET 30 tablet 11  . levothyroxine (SYNTHROID, LEVOTHROID) 50 MCG tablet TAKE 1  TABLET BY MOUTH DAILY 30 tablet 3  . montelukast (SINGULAIR) 10 MG tablet Take 10 mg by mouth daily.      Marland Kitchen omeprazole (PRILOSEC) 20 MG capsule Take 20 mg by mouth daily.      . predniSONE (DELTASONE) 5 MG tablet Take as directed by Dr. Dema Severin 120 tablet 0   No current facility-administered medications on file prior to visit.       Review of Systems Review of Systems  Constitutional: Negative for fever, appetite change, fatigue and unexpected weight change. pos for increased appetite Eyes: Negative for pain and visual disturbance.  Respiratory: Negative for cough and shortness of breath.  (improved) Cardiovascular: Negative for cp or palpitations    Gastrointestinal: Negative for nausea, diarrhea and constipation.  Genitourinary: Negative for urgency and frequency.  Skin: Negative for pallor or rash   Neurological: Negative for weakness, light-headedness, numbness and headaches.  Hematological: Negative for adenopathy. Does not bruise/bleed easily.  Psychiatric/Behavioral: Negative for dysphoric mood. The patient is not nervous/anxious.         Objective:   Physical Exam  Constitutional: She appears well-developed and well-nourished. No distress.  obese and well appearing   HENT:  Head: Normocephalic and atraumatic.  Mouth/Throat: Oropharynx is clear and moist.  Eyes: Conjunctivae and EOM are normal. Pupils are equal, round, and reactive to light. No scleral icterus.  Neck: Normal range of motion. Neck supple. No JVD present. No thyromegaly present.  Cardiovascular: Normal rate and regular rhythm.   Pulmonary/Chest: Effort normal and breath sounds normal. No respiratory distress. She has no wheezes. She has no rales.  Good air exch  Musculoskeletal: She exhibits no edema.  Lymphadenopathy:    She has no cervical adenopathy.  Neurological: She is alert. She has normal reflexes. No cranial nerve deficit. She exhibits normal muscle tone. Coordination normal.  Skin: Skin is warm  and dry. No rash noted. No erythema. No pallor.  Psychiatric: She has a normal mood and affect.          Assessment & Plan:   Problem List Items Addressed This Visit      Respiratory   ABPA (allergic bronchopulmonary aspergillosis)    Clinically improved with prednisone - she is tapering currently          Other   Glucocorticoids and synthetic analogues causing adverse effect in therapeutic use - Primary    dexa was re assuring with nl BMD in LS and FN  Will watch this every 2 years Disc ca and D Ca level nl  Disc imp of exercise  Hopefully will not be on steriods lifelong  Hyperglycemia is mild A1C due after feb 20 Will begin metformin 250 bid and watch diet carefully Disc poss side eff- will update       Hyperglycemia   Relevant Orders   Hemoglobin A1c

## 2014-11-12 DIAGNOSIS — J301 Allergic rhinitis due to pollen: Secondary | ICD-10-CM | POA: Diagnosis not present

## 2014-11-18 ENCOUNTER — Ambulatory Visit (INDEPENDENT_AMBULATORY_CARE_PROVIDER_SITE_OTHER): Payer: Medicare Other | Admitting: Internal Medicine

## 2014-11-18 ENCOUNTER — Encounter: Payer: Self-pay | Admitting: Internal Medicine

## 2014-11-18 VITALS — BP 128/80 | HR 84 | Temp 98.1°F | Ht 67.0 in | Wt 272.8 lb

## 2014-11-18 DIAGNOSIS — B4481 Allergic bronchopulmonary aspergillosis: Secondary | ICD-10-CM

## 2014-11-18 DIAGNOSIS — T50905A Adverse effect of unspecified drugs, medicaments and biological substances, initial encounter: Principal | ICD-10-CM

## 2014-11-18 DIAGNOSIS — J301 Allergic rhinitis due to pollen: Secondary | ICD-10-CM | POA: Diagnosis not present

## 2014-11-18 DIAGNOSIS — R635 Abnormal weight gain: Secondary | ICD-10-CM | POA: Diagnosis not present

## 2014-11-18 MED ORDER — PREDNISONE 5 MG PO TABS: ORAL_TABLET | ORAL | Status: DC

## 2014-11-18 NOTE — Progress Notes (Signed)
MRN# [id number] Rachael Silva 04/19/1946   CC: Chief Complaint  Patient presents with  . Follow-up    Pt still has cough with yellow mucus. Pt is on 20 mg Prednisone EOD as of last week. Pt has seen cough, wheeze and sob with exhertion since last week.      Brief History: 06/18/14 IOV /Pulmonary consult  patient is a pleasant 69 yo female with PMHx of Asthma, nasal polyps, possible sinusitis, possible Eosinophilic Pneumonia, Arthritis, seen in consultation for chronic cough.  Patient stated that she started have a cough with yellowish sputum since April of 2015, the mucus production has dissipated a little, but the color can vary from grey to yellow at times.  Over the past year she has had recurrent bout of "bronchitis" treated with multiple rounds of antibiotics and steroids; her last round as 8/19, when she received a Zpak and prednisone for 10 days for recurrent cough with productive sputum production.  She states that her bronchitis episodes last about 1 week, then she get antibiotics and steroids, her symptoms (cough, sputum production, sob), improve; however, about 5 days after completing her antibiotics\steroids her symptoms start to reoccur.  She has seen and allergist (Dr. Clelia Croft) for recurrent allergies (dust) and is on OTC allegra.  She has also seen an ENT physician (Dr. Jenne Campus), for sinusitis symptoms, who stopped her nasal steroid possible due to her nasal polyps.  Patient states that she can walk about 1/2 block before getting short of breath, and cannot climb 1 flight of stairs. She is currently on lasix for leg swelling.  Patient has cats at home. She is a never smoker. 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  Patient has been using her albuterol on a daily basis since July.   07-02-14: presented to [facility] with worsening  SOB\DOE, sputum production, noted to have elevated level of IgE, high suspicion for ABPA, started on prednisone 0.5mg /kg/QD (  daily). She had allergy profile testing performed, serum. Rapid improvement per patient since starting steroids.  At today's visit he states that she is rapidly improve, since starting prednisone which was given to her at her last visit to pulmonology [facility]. Patient is currently on 60 mg of prednisone daily, being treated for suspected ABPA (allergic bronchopulmonary aspergillosis). Should also like to review the results of her recent CT chest scan. Today she endorses mild dyspnea on exertion, mild cough which is nonproductive. IgE >2400, started on high dose tapering steroids at  and schedule for further aspergillus testing  07-15-14  Testing looks like true negative for IgE immediate allergic reaction to aspergillus. Recognize that 60 mg prednisone may mask reaction to unknown degree, although it did not limit reaction to histamine control. Another mold or another hypersensitivity pathway might still cause the syndrome in question.  08-11-14 Patient states that she is doing well today, no complaints of shortness of breath, dyspnea on exertion, productive cough, she does state she has a mild runny nose with clear discharge. Patient is currently on a prednisone taper, she started 40 mg daily on Friday, October 30. Patient states that since starting steroids she has gained approximately 10-12 pounds.  09-23-14 Patient states that she is doing better, no major complaints of sob, doe. She does have mild productive cough since switching to prednisone  every other day, but she believes this  is due to the weather causing her allergies to act up. Cough with mild yellowish sputum production, in the mornings. She has lost 8 lbs since last visit Has bone density schedule for Jan 2016.  Plan:steroid taper is as follows: Starting September 28, 2014 prednisone 25 mg  daily x2 weeks, then 25 mg every other day x2 weeks then 20 mg daily x2 weeks, then 20 mg every other day x2 weeks, then 15mg  daily x 2weeks, then 15mg  every other day x 2wks  Events since last clinic visit:   Patient states that she is doing better, no major complaints of sob, doe. She does have mild productive cough since switching to prednisone 20mg  every other day, but again,she believes this is due to the weather changes causing her allergies to act up. Cough with mild yellowish sputum production, in the mornings, this has not changed since the last visit.  No urgent care\ED visits for cough\sob.  Gained 7lbs since the last visit. Currently on prednisone 20mg  everyother day, scheduled to switch to prednisone 15mg  daily on 11/23/14.    PMHX:   Past Medical History  Diagnosis Date  . Allergy     allergic rhinitis  . Asthma   . Arthritis     OA of knees  . Hiatal hernia   . Hypothyroid   . Labile blood pressure   . Urinary incontinence   . Pneumonia, eosinophilic   . Angioedema 02/2006  . Frequent headaches    Surgical Hx:  Past Surgical History  Procedure Laterality Date  . Cesarean section      x2  . Breast surgery  1966    breast biopsy   Family Hx:  Family History  Problem Relation Age of Onset  . Diabetes Mother     pre-diabetic  . Heart disease Mother     ? heart disease  . Emphysema Mother    Social Hx:   History  Substance Use Topics  . Smoking status: Never Smoker   . Smokeless tobacco: Never Used  . Alcohol Use: No   Medication:   Current Outpatient Rx  Name  Route  Sig  Dispense  Refill  . albuterol (ACCUNEB) 0.63 MG/3ML nebulizer solution   Nebulization   Take 1 ampule by nebulization every 6 (six) hours as needed.           . budesonide (PULMICORT) 0.5 MG/2ML nebulizer solution   Nebulization   Take 0.5 mg by nebulization 2 (two) times daily.         . fexofenadine (ALLEGRA ALLERGY) 60 MG tablet   Oral   Take 60 mg by mouth daily.           . Fluticasone-Salmeterol (ADVAIR DISKUS) 250-50 MCG/DOSE AEPB   Inhalation   Inhale 1 puff into the lungs 2 (two) times daily.          . furosemide (LASIX) 20 MG tablet      TAKE 1 TABLET (20 MG TOTAL) BY MOUTH DAILY.   30 tablet   5   . furosemide (LASIX) 20 MG tablet      TAKE 1 TABLET (20 MG TOTAL) BY MOUTH DAILY.   30 tablet   1   . gentamicin ointment (GARAMYCIN) 0.1 %   Topical   Apply 1 application topically 2 (two) times daily.         Marland Kitchen. guaiFENesin-codeine (CHERATUSSIN AC) 100-10 MG/5ML syrup   Oral   Take 5 mLs by mouth every 6 (six) hours as  needed for cough.   120 mL   0   . ibuprofen (ADVIL,MOTRIN) 800 MG tablet      TAKE 1 TABLET BY MOUTH EVERY 8 HOURS AS NEEDED FOR JOINT PAIN. TAKE WITH FOOD, STOP IT STOMACH UPSET   30 tablet   11   . levothyroxine (SYNTHROID, LEVOTHROID) 50 MCG tablet      TAKE 1 TABLET BY MOUTH DAILY   30 tablet   3   . metFORMIN (GLUCOPHAGE) 500 MG tablet   Oral   Take 0.5 tablets (250 mg total) by mouth 2 (two) times daily with a meal.   30 tablet   5   . montelukast (SINGULAIR) 10 MG tablet   Oral   Take 10 mg by mouth daily.           Marland Kitchen omeprazole (PRILOSEC) 20 MG capsule   Oral   Take 20 mg by mouth daily.           . predniSONE (DELTASONE) 5 MG tablet      Take as directed by Dr. Dema Severin   120 tablet   0      Review of Systems: Gen:  Denies  fever, sweats, chills HEENT: Denies blurred vision, double vision, ear pain, eye pain, hearing loss, nose bleeds, sore throat Cvc:  No dizziness, chest pain or heaviness Resp:   Denies cough or sputum porduction, shortness of breath Gi: Denies swallowing difficulty, stomach pain, nausea or vomiting, diarrhea, constipation, bowel incontinence Gu:  Denies bladder incontinence, burning urine Ext:   No Joint pain, stiffness or swelling Skin: No skin rash, easy bruising or bleeding or hives Endoc:  No polyuria, polydipsia , polyphagia or weight change Psych:  No depression, insomnia or hallucinations  Other:  All other systems negative  Allergies:  Erythromycin  Physical Examination:  VS: BP 128/80 mmHg  Pulse 84  Temp(Src) 98.1 F (36.7 C) (Oral)  Ht 5\' 7"  (1.702 m)  Wt 272 lb 12.8 oz (123.741 kg)  BMI 42.72 kg/m2  SpO2 94%  General Appearance: No distress  HEENT: PERRLA, EOM intact, no ptosis, no other lesions noticed Pulmonary:Exam: normal breath sounds., diaphragmatic excursion normal.No wheezing, No rales   Cardiovascular:@ Exam:  Normal S1,S2.  No m/r/g.     Abdomen:Exam: Benign, Soft, non-tender, No masses  Skin:   warm, no rashes, no ecchymosis  Extremities: normal, no cyanosis, clubbing, no edema, warm with normal capillary refill.   Labs results:  BMP Lab Results  Component Value Date   NA 141 04/15/2014   K 3.7 04/15/2014   CL 106 04/15/2014   CO2 28 04/15/2014   GLUCOSE 92 08/28/2014   BUN 15 04/15/2014   CREATININE 0.9 04/15/2014     CBC CBC Latest Ref Rng 06/18/2014 11/18/2012 04/07/2011  WBC 4.0 - 10.5 K/uL 7.8 8.4 5.2  Hemoglobin 12.0 - 15.0 g/dL 13.2 44.0 10.2  Hematocrit 36.0 - 46.0 % 44.2 39.6 42.2  Platelets 150.0 - 400.0 K/uL 224.0 197.0 227.0     Rad results: none     Assessment and Plan: Weight gain due to medication Patient lost 8 pounds since last visit, Hba1c = 6.6 Again she was educated on side effects of prednisone, which includes weight gain, water retention, increased appetite. Patient was again educated on diet and exercise.  If unable to continue weaning steroid, will refer to endocrinology monitoring of chronic steroid usage.      ABPA (allergic bronchopulmonary aspergillosis) Again this is a working diagnosis, other differentials include hypersensitivity  pneumonitis, eosinophilic pneumonias, NSIP, BOOP. All of these diagnoses are treated with high-dose steroids for a prolonged period of time. She does have significant clinical improvement once steroids were initiated, with rapid  decline when steroids are terminated. For further workup with Aspergillus skin testing, IgG levels, IgA levels were within normal limits.  The results of the skin testing and immunoglobulin testing were discussed in detail with the patient, they reviewed by me personally, and the patient was given a copy of her results. Patient will require prolonged course of steroids ,possibly 3-6 months, Initially started 07/2014 Pulmonary will taper the steroids. Current steroid taper is as follows: Starting Feb, 15, 2015 prednisone 15 mg daily x2 weeks, then 15 mg every other day x2 weeks then 10 mg daily x2 weeks, then 10 mg every other day x2 weeks, then  daily x 2weeks, then  every other day x 2wks  Patient educated on side effects of steroids (weight gain, wereretention, increased appetite, muscle weakness, bone degradation, dysfunctional calcium levels, elevated blood glucose levels, anxiety/agitation at night, decreased immune).  Again patient advised to followup with primary care physician for close monitoring of calcium levels and bone density scan while on high-dose steroids. Followup in 4 weeks with pulmonary for further steroid tapering and clinical evaluation  Will continue to taper steroid doses to the lowest possible dose that willprovide symptomatic relief, the premise is to completely taper steroids off. Given her negative Aspergillus skin testing (this was done while patient on high-dose steroids, which could also give a false negative read), at some point in the future if steroids can be tapered off or patient has relapse of symptoms, will then consider evaluating her environment for mold testing and possible bronchoscopy, this was discussed at today's visit.   Patient educated on the current diagnosis, she understands that this is not an active infection, it is most likely a hyperreactive reaction to antigen exposure. She does not need/require antifungal treatment at this time. This is her  initial treatment course, if she has refractory or recurrent symptoms will consider adding antifungals at that time.         Updated Medication List Outpatient Encounter Prescriptions as of 11/18/2014  Medication Sig  . albuterol (ACCUNEB) 0.63 MG/3ML nebulizer solution Take 1 ampule by nebulization every 6 (six) hours as needed.    . budesonide (PULMICORT) 0.5 MG/2ML nebulizer solution Take 0.5 mg by nebulization 2 (two) times daily.  . fexofenadine (ALLEGRA ALLERGY) 60 MG tablet Take 60 mg by mouth daily.   . Fluticasone-Salmeterol (ADVAIR DISKUS) 250-50 MCG/DOSE AEPB Inhale 1 puff into the lungs 2 (two) times daily.   . furosemide (LASIX) 20 MG tablet TAKE 1 TABLET (20 MG TOTAL) BY MOUTH DAILY.  Marland Kitchen gentamicin ointment (GARAMYCIN) 0.1 % Apply 1 application topically 2 (two) times daily.  Marland Kitchen guaiFENesin-codeine (CHERATUSSIN AC) 100-10 MG/5ML syrup Take 5 mLs by mouth every 6 (six) hours as needed for cough.  Marland Kitchen ibuprofen (ADVIL,MOTRIN) 800 MG tablet TAKE 1 TABLET BY MOUTH EVERY 8 HOURS AS NEEDED FOR JOINT PAIN. TAKE WITH FOOD, STOP IT STOMACH UPSET  . levothyroxine (SYNTHROID, LEVOTHROID) 50 MCG tablet TAKE 1 TABLET BY MOUTH DAILY  . metFORMIN (GLUCOPHAGE) 500 MG tablet Take 0.5 tablets (250 mg total) by mouth 2 (two) times daily with a meal.  . montelukast (SINGULAIR) 10 MG tablet Take 10 mg by mouth daily.    Marland Kitchen omeprazole (PRILOSEC) 20 MG capsule Take 20 mg by mouth daily.    . predniSONE (DELTASONE) 5  MG tablet Take as directed by Dr. Dema Severin  . [DISCONTINUED] predniSONE (DELTASONE) 5 MG tablet Take as directed by Dr. Dema Severin (Patient taking differently: Take 20 mg by mouth every other day. Take as directed by Dr. Dema Severin)  . [DISCONTINUED] furosemide (LASIX) 20 MG tablet TAKE 1 TABLET (20 MG TOTAL) BY MOUTH DAILY. (Patient not taking: Reported on 11/18/2014)    Orders for this visit: No orders of the defined types were placed in this encounter.    Thank  you for the visitation and for  allowing  [facility] to assist in the care of your patient. Our recommendations are noted above.  Please contact us if we can be of further service.  Stephanie Acre, MD [facility] Office Number: [phone]

## 2014-11-18 NOTE — Assessment & Plan Note (Signed)
Patient lost 8 pounds since last visit, Hba1c = 6.6 Again she was educated on side effects of prednisone, which includes weight gain, water retention, increased appetite. Patient was again educated on diet and exercise.  If unable to continue weaning steroid, will refer to endocrinology monitoring of chronic steroid usage.

## 2014-11-18 NOTE — Assessment & Plan Note (Signed)
Again this is a working diagnosis, other differentials include hypersensitivity pneumonitis, eosinophilic pneumonias, NSIP, BOOP. All of these diagnoses are treated with high-dose steroids for a prolonged period of time. She does have significant clinical improvement once steroids were initiated, with rapid decline when steroids are terminated. For further workup with Aspergillus skin testing, IgG levels, IgA levels were within normal limits.  The results of the skin testing and immunoglobulin testing were discussed in detail with the patient, they reviewed by me personally, and the patient was given a copy of her results. Patient will require prolonged course of steroids ,possibly 3-6 months, Initially started 07/2014 Pulmonary will taper the steroids. Current steroid taper is as follows: Starting Feb, 15, 2015 prednisone 15 mg daily x2 weeks, then 15 mg every other day x2 weeks then 10 mg daily x2 weeks, then 10 mg every other day x2 weeks, then 5mg  daily x 2weeks, then 5mg  every other day x 2wks  Patient educated on side effects of steroids (weight gain, wereretention, increased appetite, muscle weakness, bone degradation, dysfunctional calcium levels, elevated blood glucose levels, anxiety/agitation at night, decreased immune).  Again patient advised to followup with primary care physician for close monitoring of calcium levels and bone density scan while on high-dose steroids. Followup in 4 weeks with pulmonary for further steroid tapering and clinical evaluation  Will continue to taper steroid doses to the lowest possible dose that willprovide symptomatic relief, the premise is to completely taper steroids off. Given her negative Aspergillus skin testing (this was done while patient on high-dose steroids, which could also give a false negative read), at some point in the future if steroids can be tapered off or patient has relapse of symptoms, will then consider evaluating her environment for mold  testing and possible bronchoscopy, this was discussed at today's visit.   Patient educated on the current diagnosis, she understands that this is not an active infection, it is most likely a hyperreactive reaction to antigen exposure. She does not need/require antifungal treatment at this time. This is her initial treatment course, if she has refractory or recurrent symptoms will consider adding antifungals at that time.

## 2014-11-18 NOTE — Patient Instructions (Addendum)
Please continue Prednisone as directed. 20 mg every other day for 2 weeks for the rest of this week. Starting Feb 15,2016 - Prednisone 15 mg for 2 weeks then 15 mg every other day for 2 weeks, followed by 10 mg daily for 2 weeks, then 10mg  everyother day for 2 wks.   If your symptoms are worst after 3 days of switching prednisone to lower dose, then go back to previous dose of prednisone.   May use nasal spray for allergies.   Follow up with Dr. Dema SeverinMungal in 1 month.

## 2014-11-26 DIAGNOSIS — J301 Allergic rhinitis due to pollen: Secondary | ICD-10-CM | POA: Diagnosis not present

## 2014-11-28 ENCOUNTER — Other Ambulatory Visit: Payer: Self-pay | Admitting: Family Medicine

## 2014-12-01 ENCOUNTER — Other Ambulatory Visit (INDEPENDENT_AMBULATORY_CARE_PROVIDER_SITE_OTHER): Payer: Medicare Other

## 2014-12-01 DIAGNOSIS — R739 Hyperglycemia, unspecified: Secondary | ICD-10-CM | POA: Diagnosis not present

## 2014-12-01 LAB — HEMOGLOBIN A1C: Hgb A1c MFr Bld: 6.7 % — ABNORMAL HIGH (ref 4.6–6.5)

## 2014-12-03 DIAGNOSIS — J301 Allergic rhinitis due to pollen: Secondary | ICD-10-CM | POA: Diagnosis not present

## 2014-12-09 DIAGNOSIS — J301 Allergic rhinitis due to pollen: Secondary | ICD-10-CM | POA: Diagnosis not present

## 2014-12-17 DIAGNOSIS — J301 Allergic rhinitis due to pollen: Secondary | ICD-10-CM | POA: Diagnosis not present

## 2014-12-21 ENCOUNTER — Ambulatory Visit: Payer: BLUE CROSS/BLUE SHIELD | Admitting: Internal Medicine

## 2014-12-23 ENCOUNTER — Ambulatory Visit: Payer: BLUE CROSS/BLUE SHIELD | Admitting: Internal Medicine

## 2014-12-23 DIAGNOSIS — J301 Allergic rhinitis due to pollen: Secondary | ICD-10-CM | POA: Diagnosis not present

## 2014-12-24 DIAGNOSIS — J301 Allergic rhinitis due to pollen: Secondary | ICD-10-CM | POA: Diagnosis not present

## 2014-12-29 ENCOUNTER — Encounter: Payer: Self-pay | Admitting: Internal Medicine

## 2014-12-29 ENCOUNTER — Ambulatory Visit (INDEPENDENT_AMBULATORY_CARE_PROVIDER_SITE_OTHER): Payer: Medicare Other | Admitting: Internal Medicine

## 2014-12-29 VITALS — BP 146/68 | HR 105 | Temp 97.6°F | Ht 67.0 in | Wt 273.0 lb

## 2014-12-29 DIAGNOSIS — R635 Abnormal weight gain: Secondary | ICD-10-CM | POA: Diagnosis not present

## 2014-12-29 DIAGNOSIS — J4540 Moderate persistent asthma, uncomplicated: Secondary | ICD-10-CM | POA: Diagnosis not present

## 2014-12-29 DIAGNOSIS — B4481 Allergic bronchopulmonary aspergillosis: Secondary | ICD-10-CM | POA: Diagnosis not present

## 2014-12-29 DIAGNOSIS — Z7952 Long term (current) use of systemic steroids: Secondary | ICD-10-CM

## 2014-12-29 DIAGNOSIS — T50905A Adverse effect of unspecified drugs, medicaments and biological substances, initial encounter: Secondary | ICD-10-CM

## 2014-12-29 DIAGNOSIS — J45991 Cough variant asthma: Secondary | ICD-10-CM | POA: Diagnosis not present

## 2014-12-29 NOTE — Patient Instructions (Addendum)
Please continue Prednisone as directed. 20 mg every other day for the next 1 month. Follow up with Dr. Dema SeverinMungal in one month. Given that you are on chronic steroids for ABPA\Asthma, we will make a referral to see an Endocrinologist. May use nasal spray for allergies.   Please use your Advair daily - please rinse and gargle after each use.

## 2014-12-29 NOTE — Assessment & Plan Note (Signed)
Unchanged wt since last visit (273lbs), Hba1c = 6.6 Again she was educated on side effects of prednisone, which includes weight gain, water retention, increased appetite. Patient was again educated on diet and exercise.  If unable to continue weaning steroid, will refer to endocrinology monitoring of chronic steroid usage.

## 2014-12-29 NOTE — Progress Notes (Signed)
MRN# [id number] Rachael Silva 04-24-1946   CC: Chief Complaint  Patient presents with  . Follow-up    Pt was doing well she had tapered down to 15 mg daily, she then began feeling since when she went to every other day. She is now at 20 mg.      Brief History: 06/18/14 IOV /Pulmonary consult  patient is a pleasant 69 yo female with PMHx of Asthma, nasal polyps, possible sinusitis, possible Eosinophilic Pneumonia, Arthritis, seen in consultation for chronic cough.  Patient stated that she started have a cough with yellowish sputum since April of 2015, the mucus production has dissipated a little, but the color can vary from grey to yellow at times.  Over the past year she has had recurrent bout of "bronchitis" treated with multiple rounds of antibiotics and steroids; her last round as 8/19, when she received a Zpak and prednisone for 10 days for recurrent cough with productive sputum production.  She states that her bronchitis episodes last about 1 week, then she get antibiotics and steroids, her symptoms (cough, sputum production, sob), improve; however, about 5 days after completing her antibiotics\steroids her symptoms start to reoccur.  She has seen and allergist (Dr. Clelia Croft) for recurrent allergies (dust) and is on OTC allegra.  She has also seen an ENT physician (Dr. Jenne Campus), for sinusitis symptoms, who stopped her nasal steroid possible due to her nasal polyps.  Patient states that she can walk about 1/2 block before getting short of breath, and cannot climb 1 flight of stairs. She is currently on lasix for leg swelling.  Patient has cats at home. She is a never smoker. 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  Patient has been using her albuterol on a daily basis since July.   07-02-14: presented to [facility] with worsening SOB\DOE, sputum  production, noted to have elevated level of IgE, high suspicion for ABPA, started on prednisone 0.5mg /kg/QD (  daily). She had allergy profile testing performed, serum. Rapid improvement per patient since starting steroids.  At today's visit he states that she is rapidly improve, since starting prednisone which was given to her at her last visit to pulmonology [facility]. Patient is currently on 60 mg of prednisone daily, being treated for suspected ABPA (allergic bronchopulmonary aspergillosis). Should also like to review the results of her recent CT chest scan. Today she endorses mild dyspnea on exertion, mild cough which is nonproductive. IgE >2400, started on high dose tapering steroids at  and schedule for further aspergillus testing  07-15-14  Testing looks like true negative for IgE immediate allergic reaction to aspergillus. Recognize that 60 mg prednisone may mask reaction to unknown degree, although it did not limit reaction to histamine control. Another mold or another hypersensitivity pathway might still cause the syndrome in question.  08-11-14 Patient states that she is doing well today, no complaints of shortness of breath, dyspnea on exertion, productive cough, she does state she has a mild runny nose with clear discharge. Patient is currently on a prednisone taper, she started 40 mg daily on Friday, October 30. Patient states that since starting steroids she has gained approximately 10-12 pounds.  09-23-14 Patient states that she is doing better, no major complaints of sob, doe. She does have mild productive cough since switching to prednisone  every other day, but she believes this  is due to the weather causing her allergies to act up. Cough with mild yellowish sputum production, in the mornings. She has lost 8 lbs since last visit Has bone density schedule for Jan 2016.  Plan:steroid taper is as follows: Starting September 28, 2014 prednisone 25 mg daily x2 weeks,  then 25 mg every other day x2 weeks then 20 mg daily x2 weeks, then 20 mg every other day x2 weeks, then 15mg  daily x 2weeks, then 15mg  every other day x 2wks  11-27-14 Patient states that she is doing better, no major complaints of sob, doe. She does have mild productive cough since switching to prednisone 20mg  every other day, but again,she believes this is due to the weather changes causing her allergies to act up. Cough with mild yellowish sputum production, in the mornings, this has not changed since the last visit.  No urgent care\ED visits for cough\sob.  Gained 7lbs since the last visit. Currently on prednisone 20mg  everyother day, scheduled to switch to prednisone 15mg  daily on 11/23/14.   Events since last clinic visit: Patient stated that she reduced to prednisone 15 mg every other day, started back having symptoms of cough, sputum production, dyspnea on exertion and wheezing (after the 3rd dose). She increased her dose back to Prednisone 20mg  every other day, and started having resolution of symptoms after the first dose (within 1-2 days).  Currently, with baseline dyspnea, no cough\sputum production. She did state that she has not been using her Advair every day, due to thinking that prednisone was sufficient.  Patient stated that she saw her ENT this morning, had decrease breath sounds, received an albuterol treatment, did not feel much difference.   PMHX:   Past Medical History  Diagnosis Date  . Allergy     allergic rhinitis  . Asthma   . Arthritis     OA of knees  . Hiatal hernia   . Hypothyroid   . Labile blood pressure   . Urinary incontinence   . Pneumonia, eosinophilic   . Angioedema 02/2006  . Frequent headaches    Surgical Hx:  Past Surgical History  Procedure Laterality Date  . Cesarean section      x2  . Breast surgery  1966    breast biopsy   Family Hx:  Family History  Problem Relation Age of Onset  . Diabetes Mother     pre-diabetic  .  Heart disease Mother     ? heart disease  . Emphysema Mother    Social Hx:   History  Substance Use Topics  . Smoking status: Never Smoker   . Smokeless tobacco: Never Used  . Alcohol Use: No   Medication:   Current Outpatient Rx  Name  Route  Sig  Dispense  Refill  . albuterol (ACCUNEB) 0.63 MG/3ML nebulizer solution   Nebulization   Take 1 ampule by nebulization every 6 (six) hours as needed.           . budesonide (PULMICORT) 0.5 MG/2ML nebulizer solution   Nebulization   Take 0.5 mg by nebulization 2 (two) times daily.         . fexofenadine (ALLEGRA ALLERGY) 60 MG tablet   Oral   Take 60 mg by mouth daily.          . Fluticasone-Salmeterol (ADVAIR DISKUS) 250-50 MCG/DOSE AEPB   Inhalation   Inhale 1 puff into the lungs 2 (two) times daily.          . furosemide (LASIX)  20 MG tablet      TAKE 1 TABLET (20 MG TOTAL) BY MOUTH DAILY.   30 tablet   5   . gentamicin ointment (GARAMYCIN) 0.1 %   Topical   Apply 1 application topically 2 (two) times daily.         Marland Kitchen guaiFENesin-codeine (CHERATUSSIN AC) 100-10 MG/5ML syrup   Oral   Take 5 mLs by mouth every 6 (six) hours as needed for cough.   120 mL   0   . ibuprofen (ADVIL,MOTRIN) 800 MG tablet      TAKE 1 TABLET BY MOUTH EVERY 8 HOURS AS NEEDED FOR JOINT PAIN. TAKE WITH FOOD, STOP IT STOMACH UPSET   30 tablet   11   . levothyroxine (SYNTHROID, LEVOTHROID) 50 MCG tablet      TAKE 1 TABLET BY MOUTH DAILY   30 tablet   3   . metFORMIN (GLUCOPHAGE) 500 MG tablet   Oral   Take 0.5 tablets (250 mg total) by mouth 2 (two) times daily with a meal.   30 tablet   5   . montelukast (SINGULAIR) 10 MG tablet   Oral   Take 10 mg by mouth daily.           Marland Kitchen omeprazole (PRILOSEC) 20 MG capsule   Oral   Take 20 mg by mouth daily.           . predniSONE (DELTASONE) 5 MG tablet      Take as directed by Dr. Dema Severin Patient taking differently: 20 mg. Take as directed by Dr. Dema Severin   120 tablet   0       Review of Systems: Gen:  Denies  fever, sweats, chills HEENT: Denies blurred vision, double vision, ear pain, eye pain, hearing loss, nose bleeds, sore throat Cvc:  No dizziness, chest pain or heaviness Resp:   Mild sob, no wheeze, no cough Gi: Denies swallowing difficulty, stomach pain, nausea or vomiting, diarrhea, constipation, bowel incontinence Gu:  Denies bladder incontinence, burning urine Ext:   No Joint pain, stiffness or swelling Skin: No skin rash, easy bruising or bleeding or hives Endoc:  No polyuria, polydipsia , polyphagia or weight change Psych: No depression, insomnia or hallucinations  Other:  All other systems negative  Allergies:  Erythromycin  Physical Examination:  VS: BP 146/68 mmHg  Pulse 105  Temp(Src) 97.6 F (36.4 C) (Oral)  Ht 5\' 7"  (1.702 m)  Wt 273 lb (123.832 kg)  BMI 42.75 kg/m2  SpO2 98%  General Appearance: No distress  Neuro: EXAM: without focal findings, mental status, speech normal, alert and oriented, cranial nerves 2-12 grossly normal  HEENT: PERRLA, EOM intact, no ptosis, no other lesions noticed Pulmonary:Exam: normal breath sounds., diaphragmatic excursion normal.No wheezing, No rales   Cardiovascular:@ Exam:  Normal S1,S2.  No m/r/g.     Abdomen:Exam: Benign, Soft, non-tender, No masses  Skin:   warm, no rashes, no ecchymosis  Extremities: normal, no cyanosis, clubbing, warm with normal capillary refill.  1+ LE pitting edema Labs results:  BMP Lab Results  Component Value Date   NA 141 04/15/2014   K 3.7 04/15/2014   CL 106 04/15/2014   CO2 28 04/15/2014   GLUCOSE 92 08/28/2014   BUN 15 04/15/2014   CREATININE 0.9 04/15/2014     CBC CBC Latest Ref Rng 06/18/2014 11/18/2012 04/07/2011  WBC 4.0 - 10.5 K/uL 7.8 8.4 5.2  Hemoglobin 12.0 - 15.0 g/dL 01.0 27.2 53.6  Hematocrit 36.0 - 46.0 %  44.2 39.6 42.2  Platelets 150.0 - 400.0 K/uL 224.0 197.0 227.0     Rad results: none    Assessment and Plan:69 year old with  history of asthma, now being treated with chronic steroid use for ABPA. Weight gain due to medication Unchanged wt since last visit (273lbs), Hba1c = 6.6 Again she was educated on side effects of prednisone, which includes weight gain, water retention, increased appetite. Patient was again educated on diet and exercise.  If unable to continue weaning steroid, will refer to endocrinology monitoring of chronic steroid usage.        Asthma, chronic Recurrent exacerbation during the first half of 2015 with elevated serum eosinophils and abnormal CT  Aspergillus IgE panel neg w/ low positive titers for Asp Nidulans, Flavus and Terreus and amstel.  She has an elevated total IgE level, CT findings with mild bronchiectasis, groundglass opacities-given her history of asthma and his current lab and radiographic abnormalities there is a high suspicion for ABPA. Currently being treated as ABPA. Patient was educated today on continuing the use of her Advair inhaler on a regular, daily basis.  Currently we are trying to taper her prednisone dose to the lowest possible effective dose that gives her the greatest clinical outcome. She recently was tapered to 15 mg every other day but had resurgence of symptoms and had to increase back to 20 mg every other day. I believe the patient will be a slow prednisone wean given the sudden recurrence of symptoms when prednisone was decreased from 20 mg every other day to 15 mg every other day.  Plan -Continue Advair as directed -Continue with steroid taper-currently 20 mg every other day for the next one month, will reassess and try to wean again. -hemoglobin A1c 6.6, starting to have water retention in the lower extremities, possibly due to chronic steroid use, given these findings will refer to [facility] endocrinology (Dr. Dossie Arbour), for closer monitoring in patient with chronic steroid use.   ABPA (allergic bronchopulmonary aspergillosis) Again this is a  working diagnosis, other differentials include hypersensitivity pneumonitis, eosinophilic pneumonias, NSIP, BOOP. All of these diagnoses are treated with high-dose steroids for a prolonged period of time. She does have significant clinical improvement once steroids were initiated, with rapid decline when steroids are terminated. For further workup with Aspergillus skin testing, IgG levels, IgA levels were within normal limits.  The results of the skin testing and immunoglobulin testing were discussed in detail with the patient, they reviewed by me personally, and the patient was given a copy of her results. Patient will require prolonged course of steroids ,possibly 3-6 months, Initially started 07/2014 Pulmonary will taper the steroids. Current steroid dose: 20 mg prednisone every other day for the next one month, reassess clinical outcome of that visit.  Patient educated on side effects of steroids (weight gain, wereretention, increased appetite, muscle weakness, bone degradation, dysfunctional calcium levels, elevated blood glucose levels, anxiety/agitation at night, decreased immune).  Will continue to taper steroid doses to the lowest possible dose that willprovide symptomatic relief, the premise is to completely taper steroids off. Given her negative Aspergillus skin testing (this was done while patient on high-dose steroids, which could also give a false negative read), at some point in the future if steroids can be tapered off or patient has relapse of symptoms, will then consider evaluating her environment for mold testing and possible bronchoscopy, this was discussed at today's visit.   Patient educated on the current diagnosis, she understands that this is not  an active infection, it is most likely a hyperreactive reaction to antigen exposure. She does not need/require antifungal treatment at this time. This is her initial treatment course, if she has refractory or recurrent symptoms will  consider adding antifungals at that time.   Currently we are trying to taper her prednisone dose to the lowest possible effective dose that gives her the greatest clinical outcome. She recently was tapered to 15 mg every other day but had resurgence of symptoms and had to increase back to 20 mg every other day. I believe the patient will be a slow prednisone wean given the sudden recurrence of symptoms when prednisone was decreased from 20 mg every other day to 15 mg every other day.         Updated Medication List Outpatient Encounter Prescriptions as of 12/29/2014  Medication Sig  . albuterol (ACCUNEB) 0.63 MG/3ML nebulizer solution Take 1 ampule by nebulization every 6 (six) hours as needed.    . budesonide (PULMICORT) 0.5 MG/2ML nebulizer solution Take 0.5 mg by nebulization 2 (two) times daily.  . fexofenadine (ALLEGRA ALLERGY) 60 MG tablet Take 60 mg by mouth daily.   . Fluticasone-Salmeterol (ADVAIR DISKUS) 250-50 MCG/DOSE AEPB Inhale 1 puff into the lungs 2 (two) times daily.   . furosemide (LASIX) 20 MG tablet TAKE 1 TABLET (20 MG TOTAL) BY MOUTH DAILY.  Marland Kitchen gentamicin ointment (GARAMYCIN) 0.1 % Apply 1 application topically 2 (two) times daily.  Marland Kitchen guaiFENesin-codeine (CHERATUSSIN AC) 100-10 MG/5ML syrup Take 5 mLs by mouth every 6 (six) hours as needed for cough.  Marland Kitchen ibuprofen (ADVIL,MOTRIN) 800 MG tablet TAKE 1 TABLET BY MOUTH EVERY 8 HOURS AS NEEDED FOR JOINT PAIN. TAKE WITH FOOD, STOP IT STOMACH UPSET  . levothyroxine (SYNTHROID, LEVOTHROID) 50 MCG tablet TAKE 1 TABLET BY MOUTH DAILY  . metFORMIN (GLUCOPHAGE) 500 MG tablet Take 0.5 tablets (250 mg total) by mouth 2 (two) times daily with a meal.  . montelukast (SINGULAIR) 10 MG tablet Take 10 mg by mouth daily.    Marland Kitchen omeprazole (PRILOSEC) 20 MG capsule Take 20 mg by mouth daily.    . predniSONE (DELTASONE) 5 MG tablet Take as directed by Dr. Dema Severin (Patient taking differently: 20 mg. Take as directed by Dr. Dema Severin)    Orders  for this visit: No orders of the defined types were placed in this encounter.    Thank  you for the visitation and for allowing  [facility] to assist in the care of your patient. Our recommendations are noted above.  Please contact us if we can be of further service.  Stephanie Acre, MD [facility] Office Number: [phone]

## 2014-12-29 NOTE — Assessment & Plan Note (Signed)
Again this is a working diagnosis, other differentials include hypersensitivity pneumonitis, eosinophilic pneumonias, NSIP, BOOP. All of these diagnoses are treated with high-dose steroids for a prolonged period of time. She does have significant clinical improvement once steroids were initiated, with rapid decline when steroids are terminated. For further workup with Aspergillus skin testing, IgG levels, IgA levels were within normal limits.  The results of the skin testing and immunoglobulin testing were discussed in detail with the patient, they reviewed by me personally, and the patient was given a copy of her results. Patient will require prolonged course of steroids ,possibly 3-6 months, Initially started 07/2014 Pulmonary will taper the steroids. Current steroid dose: 20 mg prednisone every other day for the next one month, reassess clinical outcome of that visit.  Patient educated on side effects of steroids (weight gain, wereretention, increased appetite, muscle weakness, bone degradation, dysfunctional calcium levels, elevated blood glucose levels, anxiety/agitation at night, decreased immune).  Will continue to taper steroid doses to the lowest possible dose that willprovide symptomatic relief, the premise is to completely taper steroids off. Given her negative Aspergillus skin testing (this was done while patient on high-dose steroids, which could also give a false negative read), at some point in the future if steroids can be tapered off or patient has relapse of symptoms, will then consider evaluating her environment for mold testing and possible bronchoscopy, this was discussed at today's visit.   Patient educated on the current diagnosis, she understands that this is not an active infection, it is most likely a hyperreactive reaction to antigen exposure. She does not need/require antifungal treatment at this time. This is her initial treatment course, if she has refractory or recurrent  symptoms will consider adding antifungals at that time.   Currently we are trying to taper her prednisone dose to the lowest possible effective dose that gives her the greatest clinical outcome. She recently was tapered to 15 mg every other day but had resurgence of symptoms and had to increase back to 20 mg every other day. I believe the patient will be a slow prednisone wean given the sudden recurrence of symptoms when prednisone was decreased from 20 mg every other day to 15 mg every other day.

## 2014-12-29 NOTE — Assessment & Plan Note (Addendum)
Recurrent exacerbation during the first half of 2015 with elevated serum eosinophils and abnormal CT  Aspergillus IgE panel neg w/ low positive titers for Asp Nidulans, Flavus and Terreus and amstel.  She has an elevated total IgE level, CT findings with mild bronchiectasis, groundglass opacities-given her history of asthma and his current lab and radiographic abnormalities there is a high suspicion for ABPA. Currently being treated as ABPA. Patient was educated today on continuing the use of her Advair inhaler on a regular, daily basis.  Currently we are trying to taper her prednisone dose to the lowest possible effective dose that gives her the greatest clinical outcome. She recently was tapered to 15 mg every other day but had resurgence of symptoms and had to increase back to 20 mg every other day. I believe the patient will be a slow prednisone wean given the sudden recurrence of symptoms when prednisone was decreased from 20 mg every other day to 15 mg every other day.  Plan -Continue Advair as directed -Continue with steroid taper-currently 20 mg every other day for the next one month, will reassess and try to wean again. -hemoglobin A1c 6.6, starting to have water retention in the lower extremities, possibly due to chronic steroid use, given these findings will refer to [facility] endocrinology (Dr. Dossie Arbouradhika Padake), for closer monitoring in patient with chronic steroid use.

## 2014-12-29 NOTE — Addendum Note (Signed)
Addended by: Alease FrameARTER, SONYA S on: 12/29/2014 03:45 PM   Modules accepted: Orders

## 2014-12-30 DIAGNOSIS — J301 Allergic rhinitis due to pollen: Secondary | ICD-10-CM | POA: Diagnosis not present

## 2015-01-07 DIAGNOSIS — J301 Allergic rhinitis due to pollen: Secondary | ICD-10-CM | POA: Diagnosis not present

## 2015-01-14 DIAGNOSIS — J301 Allergic rhinitis due to pollen: Secondary | ICD-10-CM | POA: Diagnosis not present

## 2015-01-20 DIAGNOSIS — J301 Allergic rhinitis due to pollen: Secondary | ICD-10-CM | POA: Diagnosis not present

## 2015-01-27 DIAGNOSIS — J301 Allergic rhinitis due to pollen: Secondary | ICD-10-CM | POA: Diagnosis not present

## 2015-01-28 ENCOUNTER — Encounter: Payer: Self-pay | Admitting: Endocrinology

## 2015-01-28 ENCOUNTER — Ambulatory Visit (INDEPENDENT_AMBULATORY_CARE_PROVIDER_SITE_OTHER): Payer: Medicare Other | Admitting: Endocrinology

## 2015-01-28 VITALS — BP 114/82 | HR 74 | Resp 14 | Ht 67.0 in | Wt 275.8 lb

## 2015-01-28 DIAGNOSIS — T380X5A Adverse effect of glucocorticoids and synthetic analogues, initial encounter: Secondary | ICD-10-CM | POA: Diagnosis not present

## 2015-01-28 DIAGNOSIS — Z7952 Long term (current) use of systemic steroids: Secondary | ICD-10-CM | POA: Diagnosis not present

## 2015-01-28 DIAGNOSIS — E099 Drug or chemical induced diabetes mellitus without complications: Secondary | ICD-10-CM | POA: Diagnosis not present

## 2015-01-28 LAB — HM DIABETES FOOT EXAM: HM Diabetic Foot Exam: NORMAL

## 2015-01-28 MED ORDER — GLUCOSE BLOOD VI STRP: ORAL_STRIP | Status: DC

## 2015-01-28 MED ORDER — ONETOUCH DELICA LANCETS 33G MISC: Status: DC

## 2015-01-28 NOTE — Patient Instructions (Signed)
Check sugars 2 x daily ( before breakfast /before lunch/ before supper and /or bedtime).  Record them in a log book and bring that/meter to next appointment.  Testing supplies sent to pharmacy.  Refer for DM education at [facility].   Return in 2 weeks with meter/log to go over the sugars and if any med adjustments are needed.  Continue metformin for now.  Change caltrate to 500 mg twice daily with daily 1000 units OTC Vitamin D.   Regular exercise as tolerated.   Please come back for a follow-up appointment in 2 weeks

## 2015-01-28 NOTE — Progress Notes (Signed)
Reason for visit-  Rachael Silva is a 69 y.o.-year-old female, referred by her pulmonologist, Dr Dema SeverinMungal, and PCP,  Tower, Audrie GallusMarne A, MD for management of  Steroid induced diabetes, controlled, without complications She is on chronic steroid therapy that is being given for treatment of ABPA since 2015.   * From today, she is on Prednisone 15 mg every other day, was taking 20 mg every other day before Goes back 4/26 for pulm follow up , going to be on this dose for the next month Takes prednisone around 11-noon, on awakening   HPI- Patient has been diagnosed with diabetes in 2016, prior hx preDM, after being on steroids since 2015.  . she has not been on insulin before.   * Started on metformin by Dr Milinda Antisower march 2016  Pt is currently on a regimen of: - Metformin 250 mg po bid- tolerating well    Last hemoglobin A1c was: Lab Results  Component Value Date   HGBA1C 6.7* 12/01/2014   HGBA1C 6.6* 08/28/2014     Pt checks her sugars 0 a day . Uses no glucometer. By recall/meter download/meter review they are:  PREMEAL Breakfast Lunch Dinner Bedtime Overall  Glucose range:     n/a  Mean/median:        POST-MEAL PC Breakfast PC Lunch PC Dinner  Glucose range:     Mean/median:       Hypoglycemia-  No lows. Lowest sugar was n/a; she has hypoglycemia awareness at 70.   Dietary habits- eats three times daily. Controls portion sizes. Ground [place], spaghetti, greens and spinach  Tries to limit carbs, sweetened beverages, sodas, desserts.  Exercise- not doing much- ride exercise bikes- has gym membership - not going though Weight - trend up Wt Readings from Last 3 Encounters:  01/28/15 275 lb 12.8 oz (125.102 kg)  12/29/14 273 lb (123.832 kg)  11/18/14 272 lb 12.8 oz (123.741 kg)    Diabetes Complications-  Nephropathy- No  CKD, last BUN/creatinine-  Lab Results  Component Value Date   BUN 15 04/15/2014   CREATININE 0.9 04/15/2014   Lab Results  Component Value Date   GFR  80.51 04/15/2014   No results found for: MICRALBCREAT    Retinopathy- No, Last DEE was in n/a Neuropathy- no numbness and tingling in )her feet. No known neuropathy.  Associated history - No CAD . No prior stroke. Has hypothyroidism- being treated with levothyroxine by PCP. her last TSH was  Lab Results  Component Value Date   TSH 3.50 11/18/2012    Lipids-  her last set of lipids were- Currently on dietary therapy. Tolerating well.   Lab Results  Component Value Date   CHOL 180 11/18/2012   HDL 52.60 11/18/2012   LDLCALC 119* 11/18/2012   LDLDIRECT 138.9 04/07/2011   TRIG 43.0 11/18/2012   CHOLHDL 3 11/18/2012    Blood Pressure/HTN- Patient's blood pressure is well controlled today.  Pt has FH of DM in mother,maternal uncle.  I have reviewed the patient's past medical history, family and social history, surgical history, medications and allergies.  Past Medical History  Diagnosis Date  . Allergy     allergic rhinitis  . Asthma   . Arthritis     OA of knees  . Hiatal hernia   . Hypothyroid   . Labile blood pressure   . Urinary incontinence   . Pneumonia, eosinophilic   . Angioedema 02/2006  . Frequent headaches   . Lung disease    Past Surgical  History  Procedure Laterality Date  . Cesarean section      x2  . Breast surgery  1966    breast biopsy   Family History  Problem Relation Age of Onset  . Diabetes Mother     pre-diabetic  . Heart disease Mother     ? heart disease  . Emphysema Mother   . Thyroid disease Mother   . Diabetes Maternal Uncle    History   Social History  . Marital Status: Single    Spouse Name: N/A  . Number of Children: 2  . Years of Education: N/A   Occupational History  . retired    Social History Main Topics  . Smoking status: Never Smoker   . Smokeless tobacco: Never Used  . Alcohol Use: No  . Drug Use: No  . Sexual Activity: Not on file   Other Topics Concern  . Not on file   Social History Narrative    Current Outpatient Prescriptions on File Prior to Visit  Medication Sig Dispense Refill  . albuterol (ACCUNEB) 0.63 MG/3ML nebulizer solution Take 1 ampule by nebulization every 6 (six) hours as needed.      . fexofenadine (ALLEGRA ALLERGY) 60 MG tablet Take 60 mg by mouth daily.     . Fluticasone-Salmeterol (ADVAIR DISKUS) 250-50 MCG/DOSE AEPB Inhale 1 puff into the lungs 2 (two) times daily.     . furosemide (LASIX) 20 MG tablet TAKE 1 TABLET (20 MG TOTAL) BY MOUTH DAILY. 30 tablet 5  . gentamicin ointment (GARAMYCIN) 0.1 % Apply 1 application topically 2 (two) times daily.    Marland Kitchen guaiFENesin-codeine (CHERATUSSIN AC) 100-10 MG/5ML syrup Take 5 mLs by mouth every 6 (six) hours as needed for cough. 120 mL 0  . ibuprofen (ADVIL,MOTRIN) 800 MG tablet TAKE 1 TABLET BY MOUTH EVERY 8 HOURS AS NEEDED FOR JOINT PAIN. TAKE WITH FOOD, STOP IT STOMACH UPSET 30 tablet 11  . levothyroxine (SYNTHROID, LEVOTHROID) 50 MCG tablet TAKE 1 TABLET BY MOUTH DAILY 30 tablet 3  . metFORMIN (GLUCOPHAGE) 500 MG tablet Take 0.5 tablets (250 mg total) by mouth 2 (two) times daily with a meal. 30 tablet 5  . montelukast (SINGULAIR) 10 MG tablet Take 10 mg by mouth daily.      Marland Kitchen omeprazole (PRILOSEC) 20 MG capsule Take 20 mg by mouth daily.      . predniSONE (DELTASONE) 5 MG tablet Take as directed by Dr. Dema Severin (Patient taking differently: 20 mg. Take as directed by Dr. Dema Severin) 120 tablet 0   No current facility-administered medications on file prior to visit.   Allergies  Allergen Reactions  . Erythromycin     REACTION: feels sick     Review of Systems: [x]  complains of  [  ] denies General:   [  ] Recent weight change [  ] Fatigue  [  ] Loss of appetite Eyes: [  ]  Vision Difficulty [  ]  Eye pain ENT: [x  ]  Hearing difficulty [  ]  Difficulty Swallowing CVS: [  ] Chest pain [  ]  Palpitations/Irregular Heart beat [  ]  Shortness of breath lying flat [ x ] Swelling of legs Resp: [ x ] Frequent Cough [ x ]  Shortness of Breath  [  ]  Wheezing GI: [  ] Heartburn  [  ] Nausea or Vomiting  [  ] Diarrhea [  ] Constipation  [  ] Abdominal Pain GU: [  ]  Polyuria  [  ]  nocturia Bones/joints:  [ x ]  Muscle aches  [  ] Joint Pain  [  ] Bone pain Skin/Hair/Nails: [  ]  Rash  [  ] New stretch marks [  ]  Itching [  ] Hair loss [  ]  Excessive hair growth Reproduction: [  ] Low sexual desire , [  ]  Women: Menstrual cycle problems [  ]  Women: Breast Discharge [  ] Men: Difficulty with erections [  ]  Men: Enlarged Breasts CNS: [  ] Frequent Headaches [ x ] Blurry vision [  ] Tremors [  ] Seizures [  ] Loss of consciousness [  ] Localized weakness Endocrine: [  ]  Excess thirst [ x ]  Feeling excessively hot [  ]  Feeling excessively cold Heme: [  ]  Easy bruising [  ]  Enlarged glands or lumps in neck Allergy: [  ]  Food allergies [  ] Environmental allergies  PE: BP 114/82 mmHg  Pulse 74  Resp 14  Ht  (1.702 m)  Wt 275 lb 12.8 oz (125.102 kg)  BMI 43.19 kg/m2  SpO2 97% Wt Readings from Last 3 Encounters:  01/28/15 275 lb 12.8 oz (125.102 kg)  12/29/14 273 lb (123.832 kg)  11/18/14 272 lb 12.8 oz (123.741 kg)   GENERAL: No acute distress, well developed HEENT:  Eye exam shows normal external appearance. Oral exam shows normal mucosa .  NECK:   Neck exam shows no lymphadenopathy. No Carotids bruits. Thyroid is not enlarged and no nodules felt.  no acanthosis nigricans LUNGS:         Chest is symmetrical. Lungs are clear to auscultation.Marland Kitchen   HEART:         Heart sounds:  S1 and S2 are normal. No murmurs or clicks heard. ABDOMEN:  No Distention present. Liver and spleen are not palpable. No other mass or tenderness present.  EXTREMITIES:     There is 1+ edema. 2+ DP pulses  NEUROLOGICAL:     Grossly intact.            Diabetic foot exam done with shoes and socks removed: Normal Monofilament testing bilaterally. No deformities of toes.  Nails  Not dystrophic. Skin normal color. No open wounds.  Dry skin.  MUSCULOSKELETAL:       There is no enlargement or gross deformity of the joints.  SKIN:       No rash  ASSESSMENT AND PLAN: Problem List Items Addressed This Visit      Endocrine   Steroid-induced diabetes mellitus - Primary    Discussed about steroids and effects on sugars. Discussed about goal sugars and A1c, risks of uncontrolled sugars and long term complications. Encouraged her to get eye exam and lipid testing at next few visits.  Discussed about dietary modifications, regular exercise, weight reduction and given her handouts. Made specific suggestions regarding low carb foods.  Refer her to [facility] lifestyle clinic for further DM education.   Given her a One touch mini meter and send testing supplies to the pharmacy. Asked her to check sugars 2 x daily at varying times of the day. FS in clinic today was 119.   Continue current metformin for now. Based on sugar review, she may need adjustment in this dose or addition of new medication. Briefly discussed available options for treatment of high sugars.   Discussed foot care, hypoglycemia, eye care.  Relevant Medications   ONETOUCH DELICA LANCETS 33G MISC   glucose blood (ONE TOUCH ULTRA TEST) test strip   Other Relevant Orders   Amb Referral to Nutrition and Diabetic E     Other   Long term current use of systemic steroids    Steroid management and further taper per Pulmonary  Encouraged her to take caltrate +D at 1 tablet twice daily for bone strength maintenance. Consider bisphosphonates for bone strength protection. Screening DXA per Dr Milinda Antis.           - Return to clinic in  2 weeks with sugar log/meter.  Kara Mierzejewski [facility] 01/28/2015 3:13 PM

## 2015-01-28 NOTE — Assessment & Plan Note (Addendum)
Steroid management and further taper per Pulmonary  Encouraged her to take caltrate +D at 1 tablet twice daily for bone strength maintenance. Consider bisphosphonates for bone strength protection. Screening DXA per Dr Tower.   

## 2015-01-28 NOTE — Assessment & Plan Note (Addendum)
Discussed about steroids and effects on sugars. Discussed about goal sugars and A1c, risks of uncontrolled sugars and long term complications. Encouraged her to get eye exam and lipid testing at next few visits.  Discussed about dietary modifications, regular exercise, weight reduction and given her handouts. Made specific suggestions regarding low carb foods.  [facility] clinic for further DM education.   Given her a One touch mini meter and send testing supplies to the pharmacy. Asked her to check sugars 2 x daily at varying times of the day. FS in clinic today was 119.   Continue current metformin for now. Based on sugar review, she may need adjustment in this dose or addition of new medication. Briefly discussed available options for treatment of high sugars.   Discussed foot care, hypoglycemia, eye care.

## 2015-01-28 NOTE — Progress Notes (Signed)
Pre visit review using our clinic review tool, if applicable. No additional management support is needed unless otherwise documented below in the visit note. 

## 2015-02-01 ENCOUNTER — Other Ambulatory Visit: Payer: Self-pay | Admitting: *Deleted

## 2015-02-01 ENCOUNTER — Telehealth: Payer: Self-pay | Admitting: Internal Medicine

## 2015-02-01 MED ORDER — PREDNISONE 5 MG PO TABS: ORAL_TABLET | ORAL | Status: DC

## 2015-02-01 NOTE — Telephone Encounter (Signed)
Refill has been submitted and patient has been notified. She called the pharmacy for refill, I told her that we didn't have a request. Patient has f/u appt with Dr. Dema SeverinMungal on tomorrow 02/02/15. Nothing further needed.

## 2015-02-01 NOTE — Telephone Encounter (Signed)
Please forward to Dr. Dema SeverinMungal. If he is out of town I am happy to address.

## 2015-02-01 NOTE — Telephone Encounter (Signed)
I have reviewed notes, looks like he is OK to refill.

## 2015-02-01 NOTE — Telephone Encounter (Signed)
Dr. Dema SeverinMungal is out of town. He will return to clinic on tomorrow.

## 2015-02-01 NOTE — Telephone Encounter (Signed)
°  1. Which medications need to be refilled? Prednisone 5 mg as directed by MD   2. Which pharmacy is medication to be sent to? [facility]    3. Do they need a 30 day or 90 day supply? 30  4. Would they like a call back once the medication has been sent to the pharmacy? Yes patient states she called last week to have rx filled and hasnt heard anything yet Please call Patient and assure her that this is done. Patient is coming to office tomorrow but out of meds for today

## 2015-02-02 ENCOUNTER — Encounter: Payer: Self-pay | Admitting: Internal Medicine

## 2015-02-02 ENCOUNTER — Ambulatory Visit (INDEPENDENT_AMBULATORY_CARE_PROVIDER_SITE_OTHER): Payer: Medicare Other | Admitting: Internal Medicine

## 2015-02-02 VITALS — BP 138/88 | HR 80 | Temp 97.9°F | Ht 67.0 in | Wt 271.6 lb

## 2015-02-02 DIAGNOSIS — B4481 Allergic bronchopulmonary aspergillosis: Secondary | ICD-10-CM | POA: Diagnosis not present

## 2015-02-02 DIAGNOSIS — J4540 Moderate persistent asthma, uncomplicated: Secondary | ICD-10-CM | POA: Diagnosis not present

## 2015-02-02 NOTE — Patient Instructions (Signed)
Follow up with Dr. Dema SeverinMungal in 2 months - continue with Prednisone 15mg  (3pills) everyother day until 02/18/15, then 10mg  (2pills) daily for 2 wks, then 10mg  everyother day until follow up  - cont with health diet  - exercise, aerobic - 30mins, 3 times a week, or water-aerobic.

## 2015-02-02 NOTE — Progress Notes (Signed)
MRN# [id number] Rachael Silva Dec 21, 1945   CC: Chief Complaint  Patient presents with  . Follow-up    f/u ABPA- chronic steroid use.      Brief History: 06/18/14 IOV /Pulmonary consult  patient is a pleasant 69 yo female with PMHx of Asthma, nasal polyps, possible sinusitis, possible Eosinophilic Pneumonia, Arthritis, seen in consultation for chronic cough.  Patient stated that she started have a cough with yellowish sputum since April of 2015, the mucus production has dissipated a little, but the color can vary from grey to yellow at times.  Over the past year she has had recurrent bout of "bronchitis" treated with multiple rounds of antibiotics and steroids; her last round as 8/19, when she received a Zpak and prednisone for 10 days for recurrent cough with productive sputum production.  She states that her bronchitis episodes last about 1 week, then she get antibiotics and steroids, her symptoms (cough, sputum production, sob), improve; however, about 5 days after completing her antibiotics\steroids her symptoms start to reoccur.  She has seen and allergist (Dr. Clelia Croft) for recurrent allergies (dust) and is on OTC allegra.  She has also seen an ENT physician (Dr. Jenne Campus), for sinusitis symptoms, who stopped her nasal steroid possible due to her nasal polyps.  Patient states that she can walk about 1/2 block before getting short of breath, and cannot climb 1 flight of stairs. She is currently on lasix for leg swelling.  Patient has cats at home. She is a never smoker. 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  Patient has been using her albuterol on a daily basis since July.   07-02-14: presented to [facility] with worsening SOB\DOE, sputum production, noted to have elevated level of IgE, high suspicion for ABPA, started on prednisone 0.5mg /kg/QD  (  daily). She had allergy profile testing performed, serum. Rapid improvement per patient since starting steroids.  At today's visit he states that she is rapidly improve, since starting prednisone which was given to her at her last visit to pulmonology [facility]. Patient is currently on 60 mg of prednisone daily, being treated for suspected ABPA (allergic bronchopulmonary aspergillosis). Should also like to review the results of her recent CT chest scan. Today she endorses mild dyspnea on exertion, mild cough which is nonproductive. IgE >2400, started on high dose tapering steroids at  and schedule for further aspergillus testing  07-15-14  Testing looks like true negative for IgE immediate allergic reaction to aspergillus. Recognize that 60 mg prednisone may mask reaction to unknown degree, although it did not limit reaction to histamine control. Another mold or another hypersensitivity pathway might still cause the syndrome in question.  08-11-14 Patient states that she is doing well today, no complaints of shortness of breath, dyspnea on exertion, productive cough, she does state she has a mild runny nose with clear discharge. Patient is currently on a prednisone taper, she started 40 mg daily on Friday, October 30. Patient states that since starting steroids she has gained approximately 10-12 pounds.  09-23-14 Patient states that she is doing better, no major complaints of sob, doe. She does have mild productive cough since switching to prednisone  every other day, but she believes this is due to the weather causing her allergies to act up. Cough with mild yellowish sputum production, in the mornings. She has lost 8 lbs  since last visit Has bone density schedule for Jan 2016.  Plan:steroid taper is as follows: Starting September 28, 2014 prednisone 25 mg daily x2 weeks, then 25 mg every other day x2 weeks then 20 mg daily x2 weeks, then 20 mg every other day x2 weeks, then  15mg  daily x 2weeks, then 15mg  every other day x 2wks  11-27-14 Patient states that she is doing better, no major complaints of sob, doe. She does have mild productive cough since switching to prednisone 20mg  every other day, but again,she believes this is due to the weather changes causing her allergies to act up. Cough with mild yellowish sputum production, in the mornings, this has not changed since the last visit.  No urgent care\ED visits for cough\sob.  Gained 7lbs since the last visit. Currently on prednisone 20mg  everyother day, scheduled to switch to prednisone 15mg  daily on 11/23/14.   12-29-14 Patient stated that she reduced to prednisone 15 mg every other day, started back having symptoms of cough, sputum production, dyspnea on exertion and wheezing (after the 3rd dose). She increased her dose back to Prednisone 20mg  every other day, and started having resolution of symptoms after the first dose (within 1-2 days).  Currently, with baseline dyspnea, no cough\sputum production. She did state that she has not been using her Advair every day, due to thinking that prednisone was sufficient.  Patient stated that she saw her ENT this morning, had decrease breath sounds, received an albuterol treatment, did not feel much difference.  Plan: prednisone 20mg  everyother day for 3-4 weeks, then 15mg  everyother day until follow up.    Events since last clinic visit: Patient presents for a follow up visit day of ABPA steroid taper. No major symptoms  Down to 15mg  prednisone everyother day, no worsening of symptoms, accompained by daughter. Using Advair twice a day on a regular basis now.   PMHX:   Past Medical History  Diagnosis Date  . Allergy     allergic rhinitis  . Asthma   . Arthritis     OA of knees  . Hiatal hernia   . Hypothyroid   . Labile blood pressure   . Urinary incontinence   . Pneumonia, eosinophilic   . Angioedema 02/2006  . Frequent headaches   . Lung disease     Surgical Hx:  Past Surgical History  Procedure Laterality Date  . Cesarean section      x2  . Breast surgery  1966    breast biopsy   Family Hx:  Family History  Problem Relation Age of Onset  . Diabetes Mother     pre-diabetic  . Heart disease Mother     ? heart disease  . Emphysema Mother   . Thyroid disease Mother   . Diabetes Maternal Uncle    Social Hx:   History  Substance Use Topics  . Smoking status: Never Smoker   . Smokeless tobacco: Never Used  . Alcohol Use: No   Medication:   Current Outpatient Rx  Name  Route  Sig  Dispense  Refill  . albuterol (ACCUNEB) 0.63 MG/3ML nebulizer solution   Nebulization   Take 1 ampule by nebulization every 6 (six) hours as needed.           . fexofenadine (ALLEGRA ALLERGY) 60 MG tablet   Oral   Take 60 mg by mouth daily.          . Fluticasone-Salmeterol (ADVAIR DISKUS) 250-50 MCG/DOSE AEPB   Inhalation  Inhale 1 puff into the lungs 2 (two) times daily.          . furosemide (LASIX) 20 MG tablet      TAKE 1 TABLET (20 MG TOTAL) BY MOUTH DAILY.   30 tablet   5   . gentamicin ointment (GARAMYCIN) 0.1 %   Topical   Apply 1 application topically 2 (two) times daily.         Marland Kitchen. glucose blood (ONE TOUCH ULTRA TEST) test strip      Test sugars twice daily   50 each   12   . guaiFENesin-codeine (CHERATUSSIN AC) 100-10 MG/5ML syrup   Oral   Take 5 mLs by mouth every 6 (six) hours as needed for cough.   120 mL   0   . ibuprofen (ADVIL,MOTRIN) 800 MG tablet      TAKE 1 TABLET BY MOUTH EVERY 8 HOURS AS NEEDED FOR JOINT PAIN. TAKE WITH FOOD, STOP IT STOMACH UPSET   30 tablet   11   . levothyroxine (SYNTHROID, LEVOTHROID) 50 MCG tablet      TAKE 1 TABLET BY MOUTH DAILY   30 tablet   3   . metFORMIN (GLUCOPHAGE) 500 MG tablet   Oral   Take 0.5 tablets (250 mg total) by mouth 2 (two) times daily with a meal.   30 tablet   5   . montelukast (SINGULAIR) 10 MG tablet   Oral   Take 10 mg by mouth  daily.           Marland Kitchen. omeprazole (PRILOSEC) 20 MG capsule   Oral   Take 20 mg by mouth daily.           Letta Pate. ONETOUCH DELICA LANCETS 33G MISC      Test sugars twice daily   50 each   11   . predniSONE (DELTASONE) 5 MG tablet      Take as directed by Dr. Dema SeverinMungal   120 tablet   0      Review of Systems: Gen:  Denies  fever, sweats, chills HEENT: Denies blurred vision, double vision, ear pain, eye pain, hearing loss, nose bleeds, sore throat Cvc:  No dizziness, chest pain or heaviness Resp:   Denies cough or sputum porduction, shortness of breath Gi: Denies swallowing difficulty, stomach pain, nausea or vomiting, diarrhea, constipation, bowel incontinence Gu:  Denies bladder incontinence, burning urine Ext:   No Joint pain, stiffness or swelling Skin: No skin rash, easy bruising or bleeding or hives Endoc:  No polyuria, polydipsia , polyphagia or weight change Psych: No depression, insomnia or hallucinations  Other:  All other systems negative  Allergies:  Erythromycin  Physical Examination:  VS: BP 138/88 mmHg  Pulse 80  Temp(Src) 97.9 F (36.6 C) (Oral)  Ht 5\' 7"  (1.702 m)  Wt 271 lb 9.6 oz (123.197 kg)  BMI 42.53 kg/m2  SpO2 92%  General Appearance: No distress  HEENT: PERRLA, EOM intact, no ptosis, no other lesions noticed Pulmonary:Exam: normal breath sounds., diaphragmatic excursion normal.No wheezing, No rales   Cardiovascular:@ Exam:  Normal S1,S2.  No m/r/g.     Abdomen:Exam: Benign, Soft, non-tender, No masses  Skin:   warm, no rashes, no ecchymosis  Extremities: normal, no cyanosis, clubbing, no edema, warm with normal capillary refill.   Labs results:  BMP Lab Results  Component Value Date   NA 141 04/15/2014   K 3.7 04/15/2014   CL 106 04/15/2014   CO2 28 04/15/2014   GLUCOSE 92  08/28/2014   BUN 15 04/15/2014   CREATININE 0.9 04/15/2014     CBC CBC Latest Ref Rng 06/18/2014 11/18/2012 04/07/2011  WBC 4.0 - 10.5 K/uL 7.8 8.4 5.2  Hemoglobin 12.0  - 15.0 g/dL 18.5 63.1 49.7  Hematocrit 36.0 - 46.0 % 44.2 39.6 42.2  Platelets 150.0 - 400.0 K/uL 224.0 197.0 227.0    Assessment and Plan:6 tibial her past medical history of asthma, following up today for a ABPA steroid reduction management Asthma, chronic Recurrent exacerbation during the first half of 2015 with elevated serum eosinophils and abnormal CT  Aspergillus IgE panel neg w/ low positive titers for Asp Nidulans, Flavus and Terreus and amstel.  She has an elevated total IgE level, CT findings with mild bronchiectasis, groundglass opacities-given her history of asthma and his current lab and radiographic abnormalities there is a high suspicion for ABPA. Currently being treated as ABPA. Patient was educated today on continuing the use of her Advair inhaler on a regular, daily basis.  Currently we are trying to taper her prednisone dose to the lowest possible effective dose that gives her the greatest clinical outcome. She recently was tapered to 15 mg every other day but had resurgence of symptoms and had to increase back to 20 mg every other day. I believe the patient will be a slow prednisone wean given the sudden recurrence of symptoms when prednisone was decreased from 20 mg every other day to 15 mg every other day.  Plan -Continue Advair as directed -Continue with steroid taper-currently 15mg  (3pills) everyother day until 02/18/15, then 10mg  (2pills) daily for 2 wks, then 10mg  everyother day until follow up  -following with [facility] for steroid induced DM     ABPA (allergic bronchopulmonary aspergillosis) Again this is a working diagnosis, other differentials include hypersensitivity pneumonitis, eosinophilic pneumonias, NSIP, BOOP. All of these diagnoses are treated with high-dose steroids for a prolonged period of time. She does have significant clinical improvement once steroids were initiated, with rapid decline when steroids are terminated. For further  workup with Aspergillus skin testing, IgG levels, IgA levels were within normal limits.  The results of the skin testing and immunoglobulin testing were discussed in detail with the patient, they reviewed by me personally, and the patient was given a copy of her results. Patient will require prolonged course of steroids ,possibly 3-6 months, Initially started 07/2014 Pulmonary will taper the steroids. Current steroid dose: 15mg  (3pills) everyother day until 02/18/15, then 10mg  (2pills) daily for 2 wks, then 10mg  everyother day until follow up   Patient educated on side effects of steroids (weight gain, wereretention, increased appetite, muscle weakness, bone degradation, dysfunctional calcium levels, elevated blood glucose levels, anxiety/agitation at night, decreased immune).  Will continue to taper steroid doses to the lowest possible dose that willprovide symptomatic relief, the premise is to completely taper steroids off. Given her negative Aspergillus skin testing (this was done while patient on high-dose steroids, which could also give a false negative read), at some point in the future if steroids can be tapered off or patient has relapse of symptoms, will then consider evaluating her environment for mold testing and possible bronchoscopy, this was discussed at today's visit.   Patient educated on the current diagnosis, she understands that this is not an active infection, it is most likely a hyperreactive reaction to antigen exposure. She does not need/require antifungal treatment at this time. This is her initial treatment course, if she has refractory or recurrent symptoms will consider adding antifungals  at that time.   Currently we are trying to taper her prednisone dose to the lowest possible effective dose that gives her the greatest clinical outcome. Continue with   (3pills) everyother day until 02/18/15, then  (2pills) daily for 2 wks, then  everyother day until follow up. We  had discussed bronchoscopy early in the diagnosis of ABPA, however, patient has refused bronchoscopy in the past and decided to forego with steroid treatment and symptomatic management. If we're unable to wean patient from steroids or symptoms return with a steroid reduction dose will need to strongly consider bronchoscopy with BAL for further evaluation of atypical infections, fungal infection. This was discussed with the patient and her daughter at this visit, for both verbalized understanding.           Updated Medication List Outpatient Encounter Prescriptions as of 02/02/2015  Medication Sig  . albuterol (ACCUNEB) 0.63 MG/3ML nebulizer solution Take 1 ampule by nebulization every 6 (six) hours as needed.    . fexofenadine (ALLEGRA ALLERGY) 60 MG tablet Take 60 mg by mouth daily.   . Fluticasone-Salmeterol (ADVAIR DISKUS) 250-50 MCG/DOSE AEPB Inhale 1 puff into the lungs 2 (two) times daily.   . furosemide (LASIX) 20 MG tablet TAKE 1 TABLET (20 MG TOTAL) BY MOUTH DAILY.  Marland Kitchen gentamicin ointment (GARAMYCIN) 0.1 % Apply 1 application topically 2 (two) times daily.  Marland Kitchen glucose blood (ONE TOUCH ULTRA TEST) test strip Test sugars twice daily  . guaiFENesin-codeine (CHERATUSSIN AC) 100-10 MG/5ML syrup Take 5 mLs by mouth every 6 (six) hours as needed for cough.  Marland Kitchen ibuprofen (ADVIL,MOTRIN) 800 MG tablet TAKE 1 TABLET BY MOUTH EVERY 8 HOURS AS NEEDED FOR JOINT PAIN. TAKE WITH FOOD, STOP IT STOMACH UPSET  . levothyroxine (SYNTHROID, LEVOTHROID) 50 MCG tablet TAKE 1 TABLET BY MOUTH DAILY  . metFORMIN (GLUCOPHAGE) 500 MG tablet Take 0.5 tablets (250 mg total) by mouth 2 (two) times daily with a meal.  . montelukast (SINGULAIR) 10 MG tablet Take 10 mg by mouth daily.    Marland Kitchen omeprazole (PRILOSEC) 20 MG capsule Take 20 mg by mouth daily.    Letta Pate DELICA LANCETS 33G MISC Test sugars twice daily  . predniSONE (DELTASONE) 5 MG tablet Take as directed by Dr. Dema Severin    Orders for this visit: No  orders of the defined types were placed in this encounter.    Thank  you for the visitation and for allowing  [facility] to assist in the care of your patient. Our recommendations are noted above.  Please contact us if we can be of further service.  Stephanie Acre, MD [facility] Office Number: [phone]

## 2015-02-03 NOTE — Assessment & Plan Note (Addendum)
Again this is a working diagnosis, other differentials include hypersensitivity pneumonitis, eosinophilic pneumonias, NSIP, BOOP. All of these diagnoses are treated with high-dose steroids for a prolonged period of time. She does have significant clinical improvement once steroids were initiated, with rapid decline when steroids are terminated. For further workup with Aspergillus skin testing, IgG levels, IgA levels were within normal limits.  The results of the skin testing and immunoglobulin testing were discussed in detail with the patient, they reviewed by me personally, and the patient was given a copy of her results. Patient will require prolonged course of steroids ,possibly 3-6 months, Initially started 07/2014 Pulmonary will taper the steroids. Current steroid dose: 15mg  (3pills) everyother day until 02/18/15, then 10mg  (2pills) daily for 2 wks, then 10mg  everyother day until follow up   Patient educated on side effects of steroids (weight gain, wereretention, increased appetite, muscle weakness, bone degradation, dysfunctional calcium levels, elevated blood glucose levels, anxiety/agitation at night, decreased immune).  Will continue to taper steroid doses to the lowest possible dose that willprovide symptomatic relief, the premise is to completely taper steroids off. Given her negative Aspergillus skin testing (this was done while patient on high-dose steroids, which could also give a false negative read), at some point in the future if steroids can be tapered off or patient has relapse of symptoms, will then consider evaluating her environment for mold testing and possible bronchoscopy, this was discussed at today's visit.   Patient educated on the current diagnosis, she understands that this is not an active infection, it is most likely a hyperreactive reaction to antigen exposure. She does not need/require antifungal treatment at this time. This is her initial treatment course, if she has  refractory or recurrent symptoms will consider adding antifungals at that time.   Currently we are trying to taper her prednisone dose to the lowest possible effective dose that gives her the greatest clinical outcome. Continue with  15mg  (3pills) everyother day until 02/18/15, then 10mg  (2pills) daily for 2 wks, then 10mg  everyother day until follow up. We had discussed bronchoscopy early in the diagnosis of ABPA, however, patient has refused bronchoscopy in the past and decided to forego with steroid treatment and symptomatic management. If we're unable to wean patient from steroids or symptoms return with a steroid reduction dose will need to strongly consider bronchoscopy with BAL for further evaluation of atypical infections, fungal infection. This was discussed with the patient and her daughter at this visit, for both verbalized understanding.

## 2015-02-03 NOTE — Assessment & Plan Note (Signed)
Recurrent exacerbation during the first half of 2015 with elevated serum eosinophils and abnormal CT  Aspergillus IgE panel neg w/ low positive titers for Asp Nidulans, Flavus and Terreus and amstel.  She has an elevated total IgE level, CT findings with mild bronchiectasis, groundglass opacities-given her history of asthma and his current lab and radiographic abnormalities there is a high suspicion for ABPA. Currently being treated as ABPA. Patient was educated today on continuing the use of her Advair inhaler on a regular, daily basis.  Currently we are trying to taper her prednisone dose to the lowest possible effective dose that gives her the greatest clinical outcome. She recently was tapered to 15 mg every other day but had resurgence of symptoms and had to increase back to 20 mg every other day. I believe the patient will be a slow prednisone wean given the sudden recurrence of symptoms when prednisone was decreased from 20 mg every other day to 15 mg every other day.  Plan -Continue Advair as directed -Continue with steroid taper-currently 15mg  (3pills) everyother day until 02/18/15, then 10mg  (2pills) daily for 2 wks, then 10mg  everyother day until follow up  -following with [facility] for steroid induced DM

## 2015-02-04 DIAGNOSIS — J301 Allergic rhinitis due to pollen: Secondary | ICD-10-CM | POA: Diagnosis not present

## 2015-02-11 ENCOUNTER — Encounter: Payer: Self-pay | Admitting: Endocrinology

## 2015-02-11 ENCOUNTER — Ambulatory Visit (INDEPENDENT_AMBULATORY_CARE_PROVIDER_SITE_OTHER): Payer: Medicare Other | Admitting: Endocrinology

## 2015-02-11 VITALS — BP 136/78 | HR 84 | Resp 16 | Ht 67.0 in | Wt 271.2 lb

## 2015-02-11 DIAGNOSIS — J301 Allergic rhinitis due to pollen: Secondary | ICD-10-CM | POA: Diagnosis not present

## 2015-02-11 DIAGNOSIS — T380X5A Adverse effect of glucocorticoids and synthetic analogues, initial encounter: Secondary | ICD-10-CM | POA: Diagnosis not present

## 2015-02-11 DIAGNOSIS — E099 Drug or chemical induced diabetes mellitus without complications: Secondary | ICD-10-CM

## 2015-02-11 NOTE — Progress Notes (Signed)
Patient ID: Rachael Silva, female   DOB: [birth year], 69 y.o.   MRN: [id number]   Reason for visit-  Rachael Silva is a 69 y.o.-year-old female,  for management of  Steroid induced diabetes, controlled, without complications She is on chronic steroid therapy that is being given for treatment of ABPA since 2015.  Last visit April 2016 * Recent visit with pulm 02/02/15- plan is to wean down steroid further - now at 15 mg every other day till next week then down now down to 10 mg daily x 2 weeks, then 10mg  every other day And plans to stay on this dose till next appt, anticipated steroid course for 3-6 months, started Oct 2015 Takes prednisone around 11-noon, on awakening   HPI- Patient has been diagnosed with diabetes in 2016, prior hx preDM, after being on steroids since 2015.  . she has not been on insulin before.   * Started on metformin by Dr Milinda Antisower march 2016  Pt is currently on a regimen of: - Metformin 250 mg po bid- tolerating well    Last hemoglobin A1c was: Lab Results  Component Value Date   HGBA1C 6.7* 12/01/2014   HGBA1C 6.6* 08/28/2014     Pt checks her sugars 1-2 a day . Uses One Touch mini glucometer. By recall/meter download/meter review they are:  PREMEAL Breakfast Lunch Dinner Bedtime Overall  Glucose range: 76-106 97-129 85-115    Mean/median:        POST-MEAL PC Breakfast PC Lunch PC Dinner  Glucose range:     Mean/median:       Hypoglycemia-  No lows. Lowest sugar was 76; she has hypoglycemia awareness at 70.   Dietary habits- eats three times daily. Controls portion sizes. Ground [place], spaghetti, greens and spinach  Tries to limit carbs, sweetened beverages, sodas, desserts.  Exercise- has gym membership - elliptical 20 min+ exercise bike - 15 min- three times weekly Weight - down  Wt Readings from Last 3 Encounters:  02/11/15 271 lb 4 oz (123.038 kg)  02/02/15 271 lb 9.6 oz (123.197 kg)  01/28/15 275 lb 12.8 oz (125.102 kg)    Diabetes  Complications-  Nephropathy- No  CKD, last BUN/creatinine-  Lab Results  Component Value Date   BUN 15 04/15/2014   CREATININE 0.9 04/15/2014   Lab Results  Component Value Date   GFR 80.51 04/15/2014   No results found for: MICRALBCREAT    Retinopathy- No, Last DEE was in n/a Neuropathy- no numbness and tingling in )her feet. No known neuropathy.  Associated history - No CAD . No prior stroke. Has hypothyroidism- being treated with levothyroxine by PCP. her last TSH was  Lab Results  Component Value Date   TSH 3.50 11/18/2012    Lipids-  her last set of lipids were- Currently on dietary therapy. Tolerating well.   Lab Results  Component Value Date   CHOL 180 11/18/2012   HDL 52.60 11/18/2012   LDLCALC 119* 11/18/2012   LDLDIRECT 138.9 04/07/2011   TRIG 43.0 11/18/2012   CHOLHDL 3 11/18/2012    Blood Pressure/HTN- Patient's blood pressure is well controlled today.   I have reviewed the patient's past medical history, medications and allergies.    Current Outpatient Prescriptions on File Prior to Visit  Medication Sig Dispense Refill  . albuterol (ACCUNEB) 0.63 MG/3ML nebulizer solution Take 1 ampule by nebulization every 6 (six) hours as needed.      . fexofenadine (ALLEGRA ALLERGY) 60 MG tablet Take 60 mg by mouth  daily.     . Fluticasone-Salmeterol (ADVAIR DISKUS) 250-50 MCG/DOSE AEPB Inhale 1 puff into the lungs 2 (two) times daily.     . furosemide (LASIX) 20 MG tablet TAKE 1 TABLET (20 MG TOTAL) BY MOUTH DAILY. 30 tablet 5  . gentamicin ointment (GARAMYCIN) 0.1 % Apply 1 application topically 2 (two) times daily.    Marland Kitchen. glucose blood (ONE TOUCH ULTRA TEST) test strip Test sugars twice daily 50 each 12  . ibuprofen (ADVIL,MOTRIN) 800 MG tablet TAKE 1 TABLET BY MOUTH EVERY 8 HOURS AS NEEDED FOR JOINT PAIN. TAKE WITH FOOD, STOP IT STOMACH UPSET 30 tablet 11  . levothyroxine (SYNTHROID, LEVOTHROID) 50 MCG tablet TAKE 1 TABLET BY MOUTH DAILY 30 tablet 3  . metFORMIN  (GLUCOPHAGE) 500 MG tablet Take 0.5 tablets (250 mg total) by mouth 2 (two) times daily with a meal. 30 tablet 5  . montelukast (SINGULAIR) 10 MG tablet Take 10 mg by mouth daily.      Marland Kitchen. omeprazole (PRILOSEC) 20 MG capsule Take 20 mg by mouth daily.      Letta Pate. ONETOUCH DELICA LANCETS 33G MISC Test sugars twice daily 50 each 11  . predniSONE (DELTASONE) 5 MG tablet Take as directed by Dr. Dema SeverinMungal 120 tablet 0   No current facility-administered medications on file prior to visit.   Allergies  Allergen Reactions  . Erythromycin     REACTION: feels sick    Review of Systems- [ x ]  Complains of    [  ]  denies [  ] Recent weight change [  ]  Fatigue [  ] polydipsia [  ] polyuria [  ]  nocturia [  ]  vision difficulty [  ] chest pain [  ] shortness of breath [  ] leg swelling [  ] cough [  ] nausea/vomiting [  ] diarrhea [  ] constipation [  ] abdominal pain [  ]  tingling/numbness in extremities [  ]  concern with feet ( wounds/sores)   PE: BP 136/78 mmHg  Pulse 84  Resp 16  Ht 5\' 7"  (1.702 m)  Wt 271 lb 4 oz (123.038 kg)  BMI 42.47 kg/m2  SpO2 95% Wt Readings from Last 3 Encounters:  02/11/15 271 lb 4 oz (123.038 kg)  02/02/15 271 lb 9.6 oz (123.197 kg)  01/28/15 275 lb 12.8 oz (125.102 kg)   Exam: deferred  ASSESSMENT AND PLAN: Problem List Items Addressed This Visit      Endocrine   Steroid-induced diabetes mellitus - Primary    She was referred for DM education at last visit- doing quite well on her own as well.  Asked her to decrease sugar testing to once daily at varying times of the day.  Current sugars are at goal on current metformin.  Continue.  Dont expect to have low sugars while on metformin, even when the dose of prednisone is decreased next week. If there are lows, then she is aware to call.    Encouraged her to get baseline eye exam.   Fasting labs at next visit              - Return to clinic in  6 weeks with sugar log/meter.  Rachael Silva  [facility] 02/11/2015 12:11 PM

## 2015-02-11 NOTE — Progress Notes (Signed)
Pre visit review using our clinic review tool, if applicable. No additional management support is needed unless otherwise documented below in the visit note. 

## 2015-02-11 NOTE — Assessment & Plan Note (Signed)
She was referred for DM education at last visit- doing quite well on her own as well.  Asked her to decrease sugar testing to once daily at varying times of the day.  Current sugars are at goal on current metformin.  Continue.  Dont expect to have low sugars while on metformin, even when the dose of prednisone is decreased next week. If there are lows, then she is aware to call.    Encouraged her to get baseline eye exam.   Fasting labs at next visit

## 2015-02-11 NOTE — Patient Instructions (Signed)
Continue to work out at [employer]. Okay to decrease testing frequency to once daily at various times of the day.   Continue current metformin.  Labs- fasting at next visit.   Please come back for a follow-up appointment in 6 weeks

## 2015-02-18 ENCOUNTER — Telehealth: Payer: Self-pay | Admitting: Family Medicine

## 2015-02-18 ENCOUNTER — Telehealth: Payer: Self-pay | Admitting: *Deleted

## 2015-02-18 ENCOUNTER — Ambulatory Visit: Payer: Medicare Other | Admitting: *Deleted

## 2015-02-18 NOTE — Telephone Encounter (Signed)
This was sent to wrong office.

## 2015-02-18 NOTE — Telephone Encounter (Signed)
Please eat a meal with protein and carbs in it (such as peanut butter with fruit) - if blood glucose does not come back up over 70- please let me know

## 2015-02-18 NOTE — Telephone Encounter (Signed)
Called and spoke to patient. Patient stable, stating she feels good and CBG is now 90. Advised patient to ensure is eating proper diet to eliminate possibility of another episode of hypoglycemia and symptoms to look for indicating blood glucose is getting low. Patient verbalized understanding.

## 2015-02-18 NOTE — Telephone Encounter (Signed)
error 

## 2015-02-18 NOTE — Telephone Encounter (Signed)
Notified Rolly PancakeMichelle Jones, RN [facility] that this came to our office by mistake to follow up with patient.

## 2015-02-18 NOTE — Telephone Encounter (Signed)
[facility] Day - [place] TELEPHONE ADVICE RECORD [facility] Patient Name: Rachael Silva DOB: [birth year] Initial Comment Caller states BS is 58, took metformin after breakfast. Drank juice, went to 52. Feels fine, no sx. Nurse Assessment Nurse: Chrys RacerKoenig, RN, Alexia FreestoneAnna Marie Date/Time Lamount Cohen(Eastern Time): 02/18/2015 3:02:42 PM Confirm and document reason for call. If symptomatic, describe symptoms. ---Caller states BS is 3858, took metformin after breakfast. Drank juice, went to 52. Feels fine, no sx. Ate [place] sandwich and had candy. Will call back in 30 min if s/s low sugar Has the patient traveled out of the country within the last 30 days? ---No Does the patient require triage? ---Yes Related visit to physician within the last 2 weeks? ---No Does the PT have any chronic conditions? (i.e. diabetes, asthma, etc.) ---Yes List chronic conditions. ---diabetes Guidelines Guideline Title Affirmed Question Affirmed Notes Diabetes - Low Blood Sugar [1] Blood glucose < 70 mg/dl (3.9 mmol/l) or symptomatic AND [2] cause known (all triage questions negative) Final Disposition [facility], RN, Alexia Freestonenna Marie

## 2015-02-24 DIAGNOSIS — J301 Allergic rhinitis due to pollen: Secondary | ICD-10-CM | POA: Diagnosis not present

## 2015-03-02 ENCOUNTER — Encounter: Payer: Medicare Other | Attending: Endocrinology | Admitting: *Deleted

## 2015-03-02 ENCOUNTER — Encounter: Payer: Self-pay | Admitting: *Deleted

## 2015-03-02 VITALS — BP 130/72 | Ht 67.0 in | Wt 276.1 lb

## 2015-03-02 DIAGNOSIS — E099 Drug or chemical induced diabetes mellitus without complications: Secondary | ICD-10-CM | POA: Diagnosis not present

## 2015-03-02 NOTE — Patient Instructions (Signed)
Check blood sugars 2 x day as directed by MD  Exercise:  Continue bike and elliptical for    30-40 minutes  3  days a week as tolerated  Avoid sugar sweetened drinks (juices)  Eat 3 meals day,  1-2  snacks a day Space meals 4-6 hours apart  Bring blood sugar records to the next appointment/class  Carry fast acting glucose and a snack at all times  Return for appointment/classes on:

## 2015-03-02 NOTE — Progress Notes (Signed)
Diabetes Self-Management Education  Visit Type: First/Initial  Appt. Start Time: 1350 Appt. End Time: 1510  03/02/2015  [name], identified by name and date of birth, is a 69 y.o. female with a diagnosis of Diabetes: Type 2.    ASSESSMENT  Blood pressure 130/72, height 5\' 7"  (1.702 m), weight 276 lb 1.6 oz (125.238 kg). Body mass index is 43.23 kg/(m^2).  Initial Visit Information:  Are you currently following a meal plan?: No   Are you taking your medications as prescribed?: No (stopped Metformin due to hypoglycemia; instructed to notify MD) Are you checking your feet?: Yes How many days per week are you checking your feet?: 7 How often do you need to have someone help you when you read instructions, pamphlets, or other written materials from your doctor or pharmacy?: 1 - Never What is the last grade level you completed in school?: college  Psychosocial:   Patient Belief/Attitude about Diabetes: Motivated to manage diabetes Self-care barriers: None Self-management support: Doctor's office, Family Patient Concerns: Nutrition/Meal planning, Medication, Monitoring, Healthy Lifestyle, Glycemic Control, Weight Control Special Needs: None Preferred Learning Style: Auditory, Visual Learning Readiness: Contemplating  Complications:   Last HgB A1C per patient/outside source: 6.7 mg/dL (0/98/112/23/16) How often do you check your blood sugar?: 1-2 times/day Fasting Blood glucose range (mg/dL): [phone] (FBG's 29-56'O70-80's mg/dL) Postprandial Blood glucose range (mg/dL):  (Pre-lunch 13-086'V90-100's mg/dL; pre-supper and bedtime 100-129 mg/dL) Number of hypoglycemic episodes per month: 1 Can you tell when your blood sugar is low?: Yes What do you do if your blood sugar is low?: drank juice Have you had a dilated eye exam in the past 12 months?: No Have you had a dental exam in the past 12 months?: Yes  Diet Intake:  Beverage(s): drinks juice blends when not treating a low blood  sugar  Exercise:  Exercise: Moderate (swimming / aerobic walking) (stationary bike and elliptical) Moderate Exercise amount of time (min / week): 40  Individualized Plan for Diabetes Self-Management Training:   Learning Objective:  Patient will have a greater understanding of diabetes self-management.   Education Topics Reviewed with Patient Today:  Factors that contribute to the development of diabetes (steroids) Role of diet in the treatment of diabetes and the relationship between the three main macronutrients and blood glucose level Role of exercise on diabetes management, blood pressure control and cardiac health. Reviewed patients medication for diabetes, action, purpose, timing of dose and side effects. (inform MD that she stopped Metformin) Purpose and frequency of SMBG., Identified appropriate SMBG and/or A1C goals. Taught treatment of hypoglycemia - the 15 rule. Relationship between chronic complications and blood glucose control    PATIENTS GOALS/Plan (Developed by the patient):  Improve blood sugars Decrease medications Prevent diabetes complications Lose weight Lead a healthier lifestyle Become more fit   Plan:   Patient Instructions  Check blood sugars 2 x day as directed by MD  Exercise:  Continue bike and elliptical for    30-40 minutes  3  days a week as tolerated  Avoid sugar sweetened drinks (juices)  Eat 3 meals day,  1-2  snacks a day Space meals 4-6 hours apart  Bring blood sugar records to the next appointment/class  Carry fast acting glucose and a snack at all times  Expected Outcomes:  Demonstrated interest in learning. Expect positive outcomes  Education material provided:  General Meal Planning Guidelines Symptoms, causes and treatments for hypoglycemia  If problems or questions, patient to contact team via:   Sharion SettlerSheila Shotwell,  RN CCM CDE [phone]  Future DSME appointment:   Class 1 April 19, 2015

## 2015-03-04 DIAGNOSIS — J301 Allergic rhinitis due to pollen: Secondary | ICD-10-CM | POA: Diagnosis not present

## 2015-03-11 DIAGNOSIS — J301 Allergic rhinitis due to pollen: Secondary | ICD-10-CM | POA: Diagnosis not present

## 2015-03-16 DIAGNOSIS — J301 Allergic rhinitis due to pollen: Secondary | ICD-10-CM | POA: Diagnosis not present

## 2015-03-18 DIAGNOSIS — J301 Allergic rhinitis due to pollen: Secondary | ICD-10-CM | POA: Diagnosis not present

## 2015-03-24 ENCOUNTER — Ambulatory Visit (INDEPENDENT_AMBULATORY_CARE_PROVIDER_SITE_OTHER): Payer: Medicare Other | Admitting: Endocrinology

## 2015-03-24 ENCOUNTER — Encounter: Payer: Self-pay | Admitting: Endocrinology

## 2015-03-24 VITALS — BP 136/80 | HR 78 | Resp 14 | Ht 67.0 in | Wt 276.0 lb

## 2015-03-24 DIAGNOSIS — E7800 Pure hypercholesterolemia, unspecified: Secondary | ICD-10-CM

## 2015-03-24 DIAGNOSIS — Z7952 Long term (current) use of systemic steroids: Secondary | ICD-10-CM

## 2015-03-24 DIAGNOSIS — T380X5A Adverse effect of glucocorticoids and synthetic analogues, initial encounter: Secondary | ICD-10-CM

## 2015-03-24 DIAGNOSIS — E099 Drug or chemical induced diabetes mellitus without complications: Secondary | ICD-10-CM

## 2015-03-24 DIAGNOSIS — J301 Allergic rhinitis due to pollen: Secondary | ICD-10-CM | POA: Diagnosis not present

## 2015-03-24 LAB — HEMOGLOBIN A1C: Hgb A1c MFr Bld: 6.3 % (ref 4.6–6.5)

## 2015-03-24 LAB — COMPREHENSIVE METABOLIC PANEL
ALK PHOS: 62 U/L (ref 39–117)
ALT: 13 U/L (ref 0–35)
AST: 18 U/L (ref 0–37)
Albumin: 3.7 g/dL (ref 3.5–5.2)
BUN: 15 mg/dL (ref 6–23)
CO2: 28 mEq/L (ref 19–32)
CREATININE: 0.99 mg/dL (ref 0.40–1.20)
Calcium: 9.3 mg/dL (ref 8.4–10.5)
Chloride: 106 mEq/L (ref 96–112)
GFR: 71.92 mL/min (ref >60.00)
Glucose, Bld: 90 mg/dL (ref 70–99)
POTASSIUM: 3.6 meq/L (ref 3.5–5.1)
Sodium: 140 mEq/L (ref 135–145)
Total Bilirubin: 0.5 mg/dL (ref 0.2–1.2)
Total Protein: 6.8 g/dL (ref 6.0–8.3)

## 2015-03-24 LAB — MICROALBUMIN / CREATININE URINE RATIO
Creatinine,U: 138.7 mg/dL
MICROALB/CREAT RATIO: 0.5 mg/g (ref 0.0–30.0)
Microalb, Ur: <0.7 mg/dL (ref 0.0–1.9)

## 2015-03-24 LAB — LIPID PANEL
CHOL/HDL RATIO: 3
Cholesterol: 204 mg/dL — ABNORMAL HIGH (ref 0–200)
HDL: 66.3 mg/dL (ref >39.00)
LDL CALC: 123 mg/dL — AB (ref 0–99)
NonHDL: 137.7
TRIGLYCERIDES: 75 mg/dL (ref 0.0–149.0)
VLDL: 15 mg/dL (ref 0.0–40.0)

## 2015-03-24 NOTE — Progress Notes (Signed)
Pre visit review using our clinic review tool, if applicable. No additional management support is needed unless otherwise documented below in the visit note. 

## 2015-03-24 NOTE — Patient Instructions (Addendum)
Check sugars atleast once daily ( please rotate times of the day when you are checking).  Record them in a log book and bring that/meter to next appointment.   Keep track of low sugars ( what is causing them- ? Extra exercise, what you ate before, was it a higher carb meal, any alcohol before) and let us know if there are too many lows.   Okay to stay off metformin.  Fasting labs today.  Please come back for a follow-up appointment in 6 weeks. Dr Milinda Antis

## 2015-03-24 NOTE — Assessment & Plan Note (Signed)
She was referred for DM education at last visits- doing quite well on her own as well.  Asked her to decrease sugar testing to once daily at varying times of the day. She is having mild hypoglycemia, which could be related to carb content of meals?, exercise. I have asked her to keep track of this.  Okay to stay off metformin, since most sugars are at target.   If she continues to have lows, even after stopping metformin, then she is aware to give [place]  A call.    Encouraged her to get baseline eye exam.   Fasting labs at this visit

## 2015-03-24 NOTE — Assessment & Plan Note (Signed)
Check fasting labs today. 

## 2015-03-24 NOTE — Progress Notes (Signed)
Reason for visit-  Rachael Silva is a 69 y.o.-year-old female,  for management of  Steroid induced diabetes, controlled, without complications She is on chronic steroid therapy that is being given for treatment of ABPA since 2015.  Last visit May 2016 * Recent visit with pulm 02/02/15- plan is to wean down steroid further - now   every other day, going back this Monday for pulm appt, anticipated steroid course for 3-6 months, started Oct 2015 Takes prednisone around 11-noon, on awakening   HPI- Patient has been diagnosed with diabetes in 2016, prior hx preDM, after being on steroids since 2015.  . she has not been on insulin before.   * Started on metformin by Dr Milinda Antis march 2016  Pt is currently on a regimen of: - Metformin 250 mg po bid- tolerating well>>off the medication since Mid May after having a low.     Last hemoglobin A1c was: Lab Results  Component Value Date   HGBA1C 6.7* 12/01/2014   HGBA1C 6.6* 08/28/2014     Pt checks her sugars 2 a day . Uses One Touch mini glucometer. By recall/meter download/meter review they are:  PREMEAL Breakfast Lunch Dinner Bedtime Overall  Glucose range: 69-90 72-120 79-121 74-141   Mean/median:        POST-MEAL PC Breakfast PC Lunch PC Dinner  Glucose range:     Mean/median:       Hypoglycemia-  Few recent lower sugars. Lowest sugar was 52 recently, after which she called her PCP in may 2016; she has hypoglycemia awareness at 70.   Dietary habits- eats three times daily. Controls portion sizes. Ground [place], spaghetti, greens and spinach  Tries to limit carbs, sweetened beverages, sodas, desserts.  Exercise- has gym membership - elliptical 20 min+ exercise bike - 15 min- three times weekly Weight -  Wt Readings from Last 3 Encounters:  03/24/15 276 lb (125.193 kg)  03/02/15 276 lb 1.6 oz (125.238 kg)  02/11/15 271 lb 4 oz (123.038 kg)    Diabetes Complications-  Nephropathy- No  CKD, last BUN/creatinine-  Lab Results   Component Value Date   BUN 15 04/15/2014   CREATININE 0.9 04/15/2014   Lab Results  Component Value Date   GFR 80.51 04/15/2014   No results found for: MICRALBCREAT    Retinopathy- No, Last DEE was in n/a Neuropathy- no numbness and tingling in )her feet. No known neuropathy.  Associated history - No CAD . No prior stroke. Has hypothyroidism- being treated with levothyroxine by PCP. her last TSH was  Lab Results  Component Value Date   TSH 3.50 11/18/2012    Lipids-  her last set of lipids were- Currently on dietary therapy. Tolerating well.   Lab Results  Component Value Date   CHOL 180 11/18/2012   HDL 52.60 11/18/2012   LDLCALC 119* 11/18/2012   LDLDIRECT 138.9 04/07/2011   TRIG 43.0 11/18/2012   CHOLHDL 3 11/18/2012    Blood Pressure/HTN- Patient's blood pressure is well controlled today.   I have reviewed the patient's past medical history, medications and allergies.    Current Outpatient Prescriptions on File Prior to Visit  Medication Sig Dispense Refill  . albuterol (ACCUNEB) 0.63 MG/3ML nebulizer solution Take 1 ampule by nebulization every 6 (six) hours as needed.      . B Complex-C-Folic Acid (STRESS B COMPLEX PO) Take 1 tablet by mouth daily.    . Calcium Carbonate-Vitamin D (CALTRATE 600+D PO) Take 1 tablet by mouth 2 (two) times daily.    Marland Kitchen  co-enzyme Q-10 30 MG capsule Take 200 mg by mouth daily.    . fexofenadine (ALLEGRA ALLERGY) 60 MG tablet Take 60 mg by mouth daily.     . Fluticasone-Salmeterol (ADVAIR DISKUS) 250-50 MCG/DOSE AEPB Inhale 1 puff into the lungs 2 (two) times daily.     . furosemide (LASIX) 20 MG tablet TAKE 1 TABLET (20 MG TOTAL) BY MOUTH DAILY. 30 tablet 5  . GARLIC PO Take 1 capsule by mouth daily.    Marland Kitchen gentamicin ointment (GARAMYCIN) 0.1 % Apply 1 application topically 2 (two) times daily.    Marland Kitchen glucose blood (ONE TOUCH ULTRA TEST) test strip Test sugars twice daily 50 each 12  . ibuprofen (ADVIL,MOTRIN) 800 MG tablet TAKE 1 TABLET  BY MOUTH EVERY 8 HOURS AS NEEDED FOR JOINT PAIN. TAKE WITH FOOD, STOP IT STOMACH UPSET 30 tablet 11  . levothyroxine (SYNTHROID, LEVOTHROID) 50 MCG tablet TAKE 1 TABLET BY MOUTH DAILY 30 tablet 3  . montelukast (SINGULAIR) 10 MG tablet Take 10 mg by mouth daily.      . Multiple Vitamins-Minerals (CENTRUM ADULTS PO) Take 1 tablet by mouth daily.    Marland Kitchen omeprazole (PRILOSEC) 20 MG capsule Take 20 mg by mouth daily.      Rachael Silva DELICA LANCETS 33G MISC Test sugars twice daily 50 each 11  . predniSONE (DELTASONE) 5 MG tablet Take as directed by Dr. Dema Severin 120 tablet 0   No current facility-administered medications on file prior to visit.   Allergies  Allergen Reactions  . Erythromycin     REACTION: feels sick    Review of Systems- [ x ]  Complains of    [  ]  denies [  ] Recent weight change [  ]  Fatigue [  ] polydipsia [  ] polyuria [  ]  nocturia [  ]  vision difficulty [  ] chest pain [  ] shortness of breath [ x ] leg swelling [  ] cough [  ] nausea/vomiting [  ] diarrhea [  ] constipation [  ] abdominal pain [  ]  tingling/numbness in extremities [  ]  concern with feet ( wounds/sores)   PE: BP 136/80 mmHg  Pulse 78  Resp 14  Ht  (1.702 m)  Wt 276 lb (125.193 kg)  BMI 43.22 kg/m2  SpO2 97% Wt Readings from Last 3 Encounters:  03/24/15 276 lb (125.193 kg)  03/02/15 276 lb 1.6 oz (125.238 kg)  02/11/15 271 lb 4 oz (123.038 kg)   Exam: deferred  ASSESSMENT AND PLAN: Problem List Items Addressed This Visit      Endocrine   Steroid-induced diabetes mellitus - Primary    She was referred for DM education at last visits- doing quite well on her own as well.  Asked her to decrease sugar testing to once daily at varying times of the day. She is having mild hypoglycemia, which could be related to carb content of meals?, exercise. I have asked her to keep track of this.  Okay to stay off metformin, since most sugars are at target.   If she continues to have lows, even  after stopping metformin, then she is aware to give [place]  A call.    Encouraged her to get baseline eye exam.   Fasting labs at this visit           Relevant Orders   Comprehensive metabolic panel   Hemoglobin A1c   Microalbumin / creatinine urine ratio  Lipid panel     Other   HYPERCHOLESTEROLEMIA    Check fasting labs today      Relevant Orders   Comprehensive metabolic panel   Hemoglobin A1c   Microalbumin / creatinine urine ratio   Lipid panel   Long term current use of systemic steroids    Steroid management and further taper per Pulmonary  Encouraged her to take caltrate +D at 1 tablet twice daily for bone strength maintenance. Consider bisphosphonates for bone strength protection. Screening DXA per Dr Milinda Antis.        Relevant Orders   Comprehensive metabolic panel   Hemoglobin A1c   Microalbumin / creatinine urine ratio   Lipid panel        - Return to clinic in  6 weeks with sugar log/meter. Explained that [place], discussed follow up care and she has elected to go back to Dr Milinda Antis for followup.   Nyiesha Beever [facility] 03/24/2015 1:31 PM

## 2015-03-24 NOTE — Assessment & Plan Note (Signed)
Steroid management and further taper per Pulmonary  Encouraged her to take caltrate +D at 1 tablet twice daily for bone strength maintenance. Consider bisphosphonates for bone strength protection. Screening DXA per Dr Milinda Antis.

## 2015-04-01 DIAGNOSIS — J301 Allergic rhinitis due to pollen: Secondary | ICD-10-CM | POA: Diagnosis not present

## 2015-04-04 ENCOUNTER — Other Ambulatory Visit: Payer: Self-pay | Admitting: Family Medicine

## 2015-04-05 ENCOUNTER — Other Ambulatory Visit: Payer: Self-pay

## 2015-04-06 ENCOUNTER — Ambulatory Visit (INDEPENDENT_AMBULATORY_CARE_PROVIDER_SITE_OTHER): Payer: Medicare Other | Admitting: Internal Medicine

## 2015-04-06 VITALS — BP 152/82 | HR 105 | Temp 98.2°F | Ht 67.0 in | Wt 273.0 lb

## 2015-04-06 DIAGNOSIS — J4540 Moderate persistent asthma, uncomplicated: Secondary | ICD-10-CM

## 2015-04-06 DIAGNOSIS — B4481 Allergic bronchopulmonary aspergillosis: Secondary | ICD-10-CM

## 2015-04-06 MED ORDER — PREDNISONE 5 MG PO TABS: ORAL_TABLET | ORAL | Status: DC

## 2015-04-06 NOTE — Assessment & Plan Note (Signed)
Recurrent exacerbation during the first half of 2015 with elevated serum eosinophils and abnormal CT  Aspergillus IgE panel neg w/ low positive titers for Asp Nidulans, Flavus and Terreus and amstel.  She has an elevated total IgE level, CT findings with mild bronchiectasis, groundglass opacities-given her history of asthma and his current lab and radiographic abnormalities there is a high suspicion for ABPA. Currently being treated as ABPA. Patient was educated today on continuing the use of her Advair inhaler on a regular, daily basis.  Currently we are trying to taper her prednisone dose to the lowest possible effective dose that gives her the greatest clinical outcome. I believe the patient will be a slow prednisone wean given the sudden recurrence of symptoms when prednisone was decreased from 20 mg every other day to 15 mg every other day.  Plan -Continue Advair as directed - continue with Prednisone 10mg  (2pills) everyother day until 04/11/15, then 7.5mg  (1.5pills) daily for 2 wks, then 7.5mg  everyother day until follow up  -following with [facility] for steroid induced DM

## 2015-04-06 NOTE — Patient Instructions (Addendum)
Follow up with Dr. Dema SeverinMungal in 2 months - continue with Prednisone 10mg  (2pills) everyother day until 04/11/15, then 7.5mg  (1.5pills) daily for 2 wks, then 7.5mg  everyother day until follow up  - cont with health diet  - exercise, aerobic - 30mins, 3 times a week, or water-aerobic.  - albuterol RESCUE inhaler - 2puff every 3-4 hours as needed for shortness of breath\wheezing\recurrent cough

## 2015-04-06 NOTE — Progress Notes (Signed)
MRN# [id number] Rachael Silva [birth year]   CC: Chief Complaint  Patient presents with  . Follow-up    Pt here for f/u asthma, she is currently on Prednisone 10 mg EOD. She has cough early mornings and is able to recover after rescue inhaler.      Brief History: 06/18/14 IOV /Pulmonary consult  patient is a pleasant 69 yo female with PMHx of Asthma, nasal polyps, possible sinusitis, possible Eosinophilic Pneumonia, Arthritis, seen in consultation for chronic cough.  Patient stated that she started have a cough with yellowish sputum since April of 2015, the mucus production has dissipated a little, but the color can vary from grey to yellow at times.  Over the past year she has had recurrent bout of "bronchitis" treated with multiple rounds of antibiotics and steroids; her last round as 8/19, when she received a Zpak and prednisone for 10 days for recurrent cough with productive sputum production.  She states that her bronchitis episodes last about 1 week, then she get antibiotics and steroids, her symptoms (cough, sputum production, sob), improve; however, about 5 days after completing her antibiotics\steroids her symptoms start to reoccur.  She has seen and allergist (Dr. Clelia Croft) for recurrent allergies (dust) and is on OTC allegra.  She has also seen an ENT physician (Dr. Jenne Campus), for sinusitis symptoms, who stopped her nasal steroid possible due to her nasal polyps.  Patient states that she can walk about 1/2 block before getting short of breath, and cannot climb 1 flight of stairs. She is currently on lasix for leg swelling.  Patient has cats at home. She is a never smoker. 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  Patient has been using her albuterol on a daily basis since July.   07-02-14: presented to [facility] with worsening SOB\DOE, sputum  production, noted to have elevated level of IgE, high suspicion for ABPA, started on prednisone 0.5mg /kg/QD (  daily). She had allergy profile testing performed, serum. Rapid improvement per patient since starting steroids.  At today's visit he states that she is rapidly improve, since starting prednisone which was given to her at her last visit to pulmonology [facility]. Patient is currently on 60 mg of prednisone daily, being treated for suspected ABPA (allergic bronchopulmonary aspergillosis). Should also like to review the results of her recent CT chest scan. Today she endorses mild dyspnea on exertion, mild cough which is nonproductive. IgE >2400, started on high dose tapering steroids at  and schedule for further aspergillus testing  07-15-14  Testing looks like true negative for IgE immediate allergic reaction to aspergillus. Recognize that 60 mg prednisone may mask reaction to unknown degree, although it did not limit reaction to histamine control. Another mold or another hypersensitivity pathway might still cause the syndrome in question.  08-11-14 Patient states that she is doing well today, no complaints of shortness of breath, dyspnea on exertion, productive cough, she does state she has a mild runny nose with clear discharge. Patient is currently on a prednisone taper, she started 40 mg daily on Friday, October 30. Patient states that since starting steroids she has gained approximately 10-12 pounds.  09-23-14 Patient states that she is doing better, no major complaints of sob, doe. She does have mild productive cough since switching to prednisone  every other day, but she believes this is due to the  weather causing her allergies to act up. Cough with mild yellowish sputum production, in the mornings. She has lost 8 lbs since last visit Has bone density schedule for Jan 2016.  Plan:steroid taper is as follows: Starting September 28, 2014 prednisone 25 mg daily x2 weeks,  then 25 mg every other day x2 weeks then 20 mg daily x2 weeks, then 20 mg every other day x2 weeks, then 15mg  daily x 2weeks, then 15mg  every other day x 2wks  11-27-14 Patient states that she is doing better, no major complaints of sob, doe. She does have mild productive cough since switching to prednisone 20mg  every other day, but again,she believes this is due to the weather changes causing her allergies to act up. Cough with mild yellowish sputum production, in the mornings, this has not changed since the last visit.  No urgent care\ED visits for cough\sob.  Gained 7lbs since the last visit. Currently on prednisone 20mg  everyother day, scheduled to switch to prednisone 15mg  daily on 11/23/14.   12-29-14 Patient stated that she reduced to prednisone 15 mg every other day, started back having symptoms of cough, sputum production, dyspnea on exertion and wheezing (after the 3rd dose). She increased her dose back to Prednisone 20mg  every other day, and started having resolution of symptoms after the first dose (within 1-2 days).  Currently, with baseline dyspnea, no cough\sputum production. She did state that she has not been using her Advair every day, due to thinking that prednisone was sufficient.  Patient stated that she saw her ENT this morning, had decrease breath sounds, received an albuterol treatment, did not feel much difference.  Plan: prednisone 20mg  everyother day for 3-4 weeks, then 15mg  everyother day until follow up.    ROV 02-02-15 Patient presents for a follow up visit day of ABPA steroid taper. No major symptoms  Down to 15mg  prednisone everyother day, no worsening of symptoms, accompained by daughter. Using Advair twice a day on a regular basis now.   Plan: Continue with 15mg  (3pills) everyother day until 02/18/15, then 10mg  (2pills) daily for 2 wks, then 10mg  everyother day until follow up. We had discussed bronchoscopy early in the diagnosis of ABPA, however,  patient has refused bronchoscopy in the past and decided to forego with steroid treatment and symptomatic management. If we're unable to wean patient from steroids or symptoms return with a steroid reduction dose will need to strongly consider bronchoscopy with BAL for further evaluation of atypical infections, fungal infection. This was discussed with the patient and her daughter at this visit, for both verbalized understanding.  Events since last clinic visit: Patient presents for a follow up visit day of ABPA steroid taper. Mild symptoms of a mild cough and mild DOE with stir climbing at the end of the day.  Down to 10mg  prednisone everyother day, no worsening of symptoms. Using Advair twice a day on a regular basis now.   Patient states that she is currently off all oral diabetic meds now, she is only on DM monitoring.    Medication:   Current Outpatient Rx  Name  Route  Sig  Dispense  Refill  . albuterol (ACCUNEB) 0.63 MG/3ML nebulizer solution   Nebulization   Take 1 ampule by nebulization every 6 (six) hours as needed.           Marland Kitchen albuterol (PROVENTIL HFA;VENTOLIN HFA) 108 (90 BASE) MCG/ACT inhaler   Inhalation   Inhale 2 puffs into the lungs every 6 (six) hours as needed for  wheezing or shortness of breath.         . B Complex-C-Folic Acid (STRESS B COMPLEX PO)   Oral   Take 1 tablet by mouth daily.         . Calcium Carbonate-Vitamin D (CALTRATE 600+D PO)   Oral   Take 1 tablet by mouth 2 (two) times daily.         Marland Kitchen. co-enzyme Q-10 30 MG capsule   Oral   Take 200 mg by mouth daily.         . fexofenadine (ALLEGRA ALLERGY) 60 MG tablet   Oral   Take 60 mg by mouth daily.          . Fluticasone-Salmeterol (ADVAIR DISKUS) 250-50 MCG/DOSE AEPB   Inhalation   Inhale 1 puff into the lungs 2 (two) times daily.          . furosemide (LASIX) 20 MG tablet      TAKE 1 TABLET (20 MG TOTAL) BY MOUTH DAILY.   30 tablet   5   . GARLIC PO   Oral   Take 1  capsule by mouth daily.         Marland Kitchen. gentamicin ointment (GARAMYCIN) 0.1 %   Topical   Apply 1 application topically 2 (two) times daily.         Marland Kitchen. glucose blood (ONE TOUCH ULTRA TEST) test strip      Test sugars twice daily   50 each   12   . ibuprofen (ADVIL,MOTRIN) 800 MG tablet      TAKE 1 TABLET BY MOUTH EVERY 8 HOURS AS NEEDED FOR JOINT PAIN. TAKE WITH FOOD, STOP IT STOMACH UPSET   30 tablet   11   . levothyroxine (SYNTHROID, LEVOTHROID) 50 MCG tablet      TAKE 1 TABLET BY MOUTH DAILY   30 tablet   0   . montelukast (SINGULAIR) 10 MG tablet   Oral   Take 10 mg by mouth daily.           . Multiple Vitamins-Minerals (CENTRUM ADULTS PO)   Oral   Take 1 tablet by mouth daily.         Marland Kitchen. omeprazole (PRILOSEC) 20 MG capsule   Oral   Take 20 mg by mouth daily.           Letta Pate. ONETOUCH DELICA LANCETS 33G MISC      Test sugars twice daily   50 each   11   . predniSONE (DELTASONE) 5 MG tablet      Take 10 mg every other day for 2 weeks; then 7.5 mg (1.5) pills daily for 2 weeks, then 7.5 mg every other day.   120 tablet   0     Take as directed by Dr. Dema SeverinMungal      Review of Systems: Gen:  Denies  fever, sweats, chills HEENT: Denies blurred vision, double vision, ear pain, eye pain, hearing loss, nose bleeds, sore throat Cvc:  No dizziness, chest pain or heaviness Resp:   Admits ZO:XWRUto:mild cough and doe Gi: Denies swallowing difficulty, stomach pain, nausea or vomiting, diarrhea, constipation, bowel incontinence Gu:  Denies bladder incontinence, burning urine Ext:   No Joint pain, stiffness or swelling Skin: No skin rash, easy bruising or bleeding or hives Endoc:  No polyuria, polydipsia , polyphagia or weight change Other:  All other systems negative  Allergies:  Erythromycin  Physical Examination:  VS: BP 152/82 mmHg  Pulse 105  Temp(Src) 98.2  F (36.8 C) (Oral)  Ht 5\' 7"  (1.702 m)  Wt 273 lb (123.832 kg)  BMI 42.75 kg/m2  SpO2 98%  General  Appearance: No distress  HEENT: PERRLA, no ptosis, no other lesions noticed Pulmonary:normal breath sounds., diaphragmatic excursion normal.No wheezing, No rales   Cardiovascular:  Normal S1,S2.  No m/r/g.     Abdomen:Exam: Benign, Soft, non-tender, No masses  Skin:   warm, no rashes, no ecchymosis  Extremities: normal, no cyanosis, clubbing, warm with normal capillary refill.      Assessment and Plan:69 yo female past medical history of asthma, following up today for a ABPA steroid reduction management ABPA (allergic bronchopulmonary aspergillosis) Again this is a working diagnosis, other differentials include hypersensitivity pneumonitis, eosinophilic pneumonias, NSIP, BOOP. All of these diagnoses are treated with high-dose steroids for a prolonged period of time. She does have significant clinical improvement once steroids were initiated, with rapid decline when steroids are terminated. For further workup with Aspergillus skin testing, IgG levels, IgA levels were within normal limits.  The results of the skin testing and immunoglobulin testing were discussed in detail with the patient, they reviewed by me personally, and the patient was given a copy of her results. Patient will require prolonged course of steroids ,possibly 3-6 months, Initially started 07/2014 Pulmonary will taper the steroids. Current steroid dose: Prednisone 10mg  (2pills) everyother day until 04/11/15, then 7.5mg  (1.5pills) daily for 2 wks, then 7.5mg  everyother day until follow up    Patient educated on side effects of steroids (weight gain, wereretention, increased appetite, muscle weakness, bone degradation, dysfunctional calcium levels, elevated blood glucose levels, anxiety/agitation at night, decreased immune).  Will continue to taper steroid doses to the lowest possible dose that will provide symptomatic relief, the premise is to completely taper steroids off. Given her negative Aspergillus skin testing (this was  done while patient on high-dose steroids, which could also give a false negative read), at some point in the future if steroids can be tapered off or patient has relapse of symptoms, will then consider evaluating her environment for mold testing and possible bronchoscopy, this was discussed at today's visit.   Patient educated on the current diagnosis, she understands that this is not an active infection, it is most likely a hyperreactive reaction to antigen exposure. She does not need/require antifungal treatment at this time. This is her initial treatment course, if she has refractory or recurrent symptoms will consider adding antifungals at that time.   Currently we are trying to taper her prednisone dose to the lowest possible effective dose that gives her the greatest clinical outcome. Continue with Prednisone 10mg  (2pills) everyother day until 04/11/15, then 7.5mg  (1.5pills) daily for 2 wks, then 7.5mg  everyother day until follow up  We had discussed bronchoscopy early in the diagnosis of ABPA, however, patient has refused bronchoscopy in the past and decided to forego with steroid treatment and symptomatic management. If we're unable to wean patient from steroids or symptoms return with a steroid reduction dose will need to strongly consider bronchoscopy with BAL for further evaluation of atypical infections, fungal infection. This was discussed with the patient and her daughter at this visit, for both verbalized understanding.          Asthma, chronic Recurrent exacerbation during the first half of 2015 with elevated serum eosinophils and abnormal CT  Aspergillus IgE panel neg w/ low positive titers for Asp Nidulans, Flavus and Terreus and amstel.  She has an elevated total IgE level, CT findings with mild  bronchiectasis, groundglass opacities-given her history of asthma and his current lab and radiographic abnormalities there is a high suspicion for ABPA. Currently being treated as  ABPA. Patient was educated today on continuing the use of her Advair inhaler on a regular, daily basis.  Currently we are trying to taper her prednisone dose to the lowest possible effective dose that gives her the greatest clinical outcome. I believe the patient will be a slow prednisone wean given the sudden recurrence of symptoms when prednisone was decreased from 20 mg every other day to 15 mg every other day.  Plan -Continue Advair as directed - continue with Prednisone 10mg  (2pills) everyother day until 04/11/15, then 7.5mg  (1.5pills) daily for 2 wks, then 7.5mg  everyother day until follow up  -following with [facility] for steroid induced DM        Updated Medication List Outpatient Encounter Prescriptions as of 04/06/2015  Medication Sig  . albuterol (ACCUNEB) 0.63 MG/3ML nebulizer solution Take 1 ampule by nebulization every 6 (six) hours as needed.    Marland Kitchen albuterol (PROVENTIL HFA;VENTOLIN HFA) 108 (90 BASE) MCG/ACT inhaler Inhale 2 puffs into the lungs every 6 (six) hours as needed for wheezing or shortness of breath.  . B Complex-C-Folic Acid (STRESS B COMPLEX PO) Take 1 tablet by mouth daily.  . Calcium Carbonate-Vitamin D (CALTRATE 600+D PO) Take 1 tablet by mouth 2 (two) times daily.  Marland Kitchen co-enzyme Q-10 30 MG capsule Take 200 mg by mouth daily.  . fexofenadine (ALLEGRA ALLERGY) 60 MG tablet Take 60 mg by mouth daily.   . Fluticasone-Salmeterol (ADVAIR DISKUS) 250-50 MCG/DOSE AEPB Inhale 1 puff into the lungs 2 (two) times daily.   . furosemide (LASIX) 20 MG tablet TAKE 1 TABLET (20 MG TOTAL) BY MOUTH DAILY.  Marland Kitchen GARLIC PO Take 1 capsule by mouth daily.  Marland Kitchen gentamicin ointment (GARAMYCIN) 0.1 % Apply 1 application topically 2 (two) times daily.  Marland Kitchen glucose blood (ONE TOUCH ULTRA TEST) test strip Test sugars twice daily  . ibuprofen (ADVIL,MOTRIN) 800 MG tablet TAKE 1 TABLET BY MOUTH EVERY 8 HOURS AS NEEDED FOR JOINT PAIN. TAKE WITH FOOD, STOP IT STOMACH UPSET  .  levothyroxine (SYNTHROID, LEVOTHROID) 50 MCG tablet TAKE 1 TABLET BY MOUTH DAILY  . montelukast (SINGULAIR) 10 MG tablet Take 10 mg by mouth daily.    . Multiple Vitamins-Minerals (CENTRUM ADULTS PO) Take 1 tablet by mouth daily.  Marland Kitchen omeprazole (PRILOSEC) 20 MG capsule Take 20 mg by mouth daily.    Letta Pate DELICA LANCETS 33G MISC Test sugars twice daily  . predniSONE (DELTASONE) 5 MG tablet Take 10 mg every other day for 2 weeks; then 7.5 mg (1.5) pills daily for 2 weeks, then 7.5 mg every other day.  . [DISCONTINUED] predniSONE (DELTASONE) 5 MG tablet Take as directed by Dr. Dema Severin   No facility-administered encounter medications on file as of 04/06/2015.    Orders for this visit: No orders of the defined types were placed in this encounter.    Thank  you for the visitation and for allowing  [facility] to assist in the care of your patient. Our recommendations are noted above.  Please contact us if we can be of further service.  Stephanie Acre, MD [facility] Office Number: [phone]

## 2015-04-06 NOTE — Assessment & Plan Note (Signed)
Again this is a working diagnosis, other differentials include hypersensitivity pneumonitis, eosinophilic pneumonias, NSIP, BOOP. All of these diagnoses are treated with high-dose steroids for a prolonged period of time. She does have significant clinical improvement once steroids were initiated, with rapid decline when steroids are terminated. For further workup with Aspergillus skin testing, IgG levels, IgA levels were within normal limits.  The results of the skin testing and immunoglobulin testing were discussed in detail with the patient, they reviewed by me personally, and the patient was given a copy of her results. Patient will require prolonged course of steroids ,possibly 3-6 months, Initially started 07/2014 Pulmonary will taper the steroids. Current steroid dose: Prednisone 10mg  (2pills) everyother day until 04/11/15, then 7.5mg  (1.5pills) daily for 2 wks, then 7.5mg  everyother day until follow up    Patient educated on side effects of steroids (weight gain, wereretention, increased appetite, muscle weakness, bone degradation, dysfunctional calcium levels, elevated blood glucose levels, anxiety/agitation at night, decreased immune).  Will continue to taper steroid doses to the lowest possible dose that will provide symptomatic relief, the premise is to completely taper steroids off. Given her negative Aspergillus skin testing (this was done while patient on high-dose steroids, which could also give a false negative read), at some point in the future if steroids can be tapered off or patient has relapse of symptoms, will then consider evaluating her environment for mold testing and possible bronchoscopy, this was discussed at today's visit.   Patient educated on the current diagnosis, she understands that this is not an active infection, it is most likely a hyperreactive reaction to antigen exposure. She does not need/require antifungal treatment at this time. This is her initial treatment  course, if she has refractory or recurrent symptoms will consider adding antifungals at that time.   Currently we are trying to taper her prednisone dose to the lowest possible effective dose that gives her the greatest clinical outcome. Continue with Prednisone 10mg  (2pills) everyother day until 04/11/15, then 7.5mg  (1.5pills) daily for 2 wks, then 7.5mg  everyother day until follow up  We had discussed bronchoscopy early in the diagnosis of ABPA, however, patient has refused bronchoscopy in the past and decided to forego with steroid treatment and symptomatic management. If we're unable to wean patient from steroids or symptoms return with a steroid reduction dose will need to strongly consider bronchoscopy with BAL for further evaluation of atypical infections, fungal infection. This was discussed with the patient and her daughter at this visit, for both verbalized understanding.

## 2015-04-07 DIAGNOSIS — J301 Allergic rhinitis due to pollen: Secondary | ICD-10-CM | POA: Diagnosis not present

## 2015-04-14 LAB — HM DIABETES EYE EXAM

## 2015-04-15 DIAGNOSIS — J301 Allergic rhinitis due to pollen: Secondary | ICD-10-CM | POA: Diagnosis not present

## 2015-04-21 DIAGNOSIS — H35372 Puckering of macula, left eye: Secondary | ICD-10-CM | POA: Diagnosis not present

## 2015-04-21 DIAGNOSIS — E119 Type 2 diabetes mellitus without complications: Secondary | ICD-10-CM | POA: Diagnosis not present

## 2015-04-26 DIAGNOSIS — J301 Allergic rhinitis due to pollen: Secondary | ICD-10-CM | POA: Diagnosis not present

## 2015-04-29 ENCOUNTER — Other Ambulatory Visit: Payer: Self-pay | Admitting: Family Medicine

## 2015-05-03 ENCOUNTER — Telehealth: Payer: Self-pay

## 2015-05-03 NOTE — Telephone Encounter (Signed)
Called and spoke with patient, and notified them that they were due for a Mammogram. Patient wanted information on [facility]. Patient will call and schedule an appointment.  

## 2015-05-05 ENCOUNTER — Encounter: Payer: Self-pay | Admitting: Family Medicine

## 2015-05-05 ENCOUNTER — Telehealth: Payer: Self-pay | Admitting: Family Medicine

## 2015-05-05 ENCOUNTER — Ambulatory Visit (INDEPENDENT_AMBULATORY_CARE_PROVIDER_SITE_OTHER): Payer: Medicare Other | Admitting: Family Medicine

## 2015-05-05 VITALS — BP 122/80 | HR 75 | Temp 98.1°F | Ht 67.0 in | Wt 273.8 lb

## 2015-05-05 DIAGNOSIS — E669 Obesity, unspecified: Secondary | ICD-10-CM | POA: Diagnosis not present

## 2015-05-05 DIAGNOSIS — E7800 Pure hypercholesterolemia, unspecified: Secondary | ICD-10-CM

## 2015-05-05 DIAGNOSIS — R0683 Snoring: Secondary | ICD-10-CM

## 2015-05-05 DIAGNOSIS — T380X5A Adverse effect of glucocorticoids and synthetic analogues, initial encounter: Secondary | ICD-10-CM

## 2015-05-05 DIAGNOSIS — E099 Drug or chemical induced diabetes mellitus without complications: Secondary | ICD-10-CM | POA: Diagnosis not present

## 2015-05-05 DIAGNOSIS — E66812 Obesity, class 2: Secondary | ICD-10-CM | POA: Insufficient documentation

## 2015-05-05 NOTE — Progress Notes (Signed)
Subjective:    Patient ID: Rachael Silva, female    DOB: June 08, 1946, 69 y.o.   MRN: [id number]  HPI Here for f/u of chronic health problems   Feeling ok  Breathing is pretty good   Has some OA in R knee  May have overdone it at the gym - was hurting her the next day  Uses eliptical machine  Rested it for a few days    Wt is stable   BP Readings from Last 3 Encounters:  05/05/15 142/70  04/06/15 152/82  03/24/15 136/80     Hyperlipidemia Lab Results  Component Value Date   CHOL 204* 03/24/2015   HDL 66.30 03/24/2015   LDLCALC 123* 03/24/2015   LDLDIRECT 138.9 04/07/2011   TRIG 75.0 03/24/2015   CHOLHDL 3 03/24/2015    Diabetes Home sugar results - better - watches closely for low glucose / occ skips meals DM diet - eating pretty healthy  Exercise - tries/ had a problem with her R knee (OA)  Symptoms- none  A1C last  Lab Results  Component Value Date   HGBA1C 6.3 03/24/2015    No longer needing metformin (dropped her too low) Still on prednisone 7.5 QOD  microalb is ok  Last eye exam - had it / was earlier this mo-no retinopathy   A neurologist that she saw with her mother asked her about sleep apnea  Snores  Is always fatigue  Does doze off easily  Is concerned  Is overweight   Patient Active Problem List   Diagnosis Date Noted  . Steroid-induced diabetes mellitus 11/11/2014  . Glucocorticoids and synthetic analogues causing adverse effect in therapeutic use 08/27/2014  . Long term current use of systemic steroids 08/27/2014  . Estrogen deficiency 08/27/2014  . ABPA (allergic bronchopulmonary aspergillosis) 07/08/2014  . Cough 06/18/2014  . Asthma, chronic 06/18/2014  . SOB (shortness of breath) 06/18/2014  . Productive cough 05/27/2014  . Bursitis, shoulder 12/09/2013  . Acute bronchitis with bronchospasm 05/23/2013  . Abnormal mammogram with microcalcification 06/06/2011  . Other screening mammogram 04/17/2011  . Routine general medical  examination at a health care facility 04/17/2011  . HYPOTHYROIDISM 06/01/2009  . EDEMA 03/03/2009  . SINUSITIS, CHRONIC 08/18/2008  . ARTHRITIS, RIGHT KNEE 04/15/2008  . Weight gain due to medication 02/18/2008  . HOT FLASHES 02/18/2008  . GERD 08/01/2007  . HYPERCHOLESTEROLEMIA 05/15/2007  . ALLERGIC RHINITIS 05/15/2007  . ASTHMA 05/15/2007  . PERIODONTAL DISEASE 05/15/2007  . MURMUR 05/15/2007  . URINARY INCONTINENCE 05/15/2007   Past Medical History  Diagnosis Date  . Allergy     allergic rhinitis  . Asthma   . Arthritis     OA of knees  . Hiatal hernia   . Hypothyroid   . Labile blood pressure   . Urinary incontinence   . Pneumonia, eosinophilic   . Angioedema 02/2006  . Frequent headaches   . Lung disease    Past Surgical History  Procedure Laterality Date  . Cesarean section      x2  . Breast surgery  1966    breast biopsy   History  Substance Use Topics  . Smoking status: Never Smoker   . Smokeless tobacco: Never Used  . Alcohol Use: No   Family History  Problem Relation Age of Onset  . Diabetes Mother     pre-diabetic  . Heart disease Mother     ? heart disease  . Emphysema Mother   . Thyroid disease Mother   .  Diabetes Maternal Uncle    Allergies  Allergen Reactions  . Erythromycin     REACTION: feels sick   Current Outpatient Prescriptions on File Prior to Visit  Medication Sig Dispense Refill  . albuterol (ACCUNEB) 0.63 MG/3ML nebulizer solution Take 1 ampule by nebulization every 6 (six) hours as needed.      Marland Kitchen albuterol (PROVENTIL HFA;VENTOLIN HFA) 108 (90 BASE) MCG/ACT inhaler Inhale 2 puffs into the lungs every 6 (six) hours as needed for wheezing or shortness of breath.    . B Complex-C-Folic Acid (STRESS B COMPLEX PO) Take 1 tablet by mouth daily.    . Calcium Carbonate-Vitamin D (CALTRATE 600+D PO) Take 1 tablet by mouth 2 (two) times daily.    Marland Kitchen co-enzyme Q-10 30 MG capsule Take 200 mg by mouth daily.    . fexofenadine (ALLEGRA  ALLERGY) 60 MG tablet Take 60 mg by mouth daily.     . Fluticasone-Salmeterol (ADVAIR DISKUS) 250-50 MCG/DOSE AEPB Inhale 1 puff into the lungs 2 (two) times daily.     . furosemide (LASIX) 20 MG tablet TAKE 1 TABLET (20 MG TOTAL) BY MOUTH DAILY. 30 tablet 5  . GARLIC PO Take 1 capsule by mouth daily.    Marland Kitchen gentamicin ointment (GARAMYCIN) 0.1 % Apply 1 application topically 2 (two) times daily.    Marland Kitchen glucose blood (ONE TOUCH ULTRA TEST) test strip Test sugars twice daily 50 each 12  . ibuprofen (ADVIL,MOTRIN) 800 MG tablet TAKE 1 TABLET BY MOUTH EVERY 8 HOURS AS NEEDED FOR JOINT PAIN. TAKE WITH FOOD, STOP IT STOMACH UPSET 30 tablet 11  . levothyroxine (SYNTHROID, LEVOTHROID) 50 MCG tablet TAKE 1 TABLET BY MOUTH DAILY 30 tablet 0  . montelukast (SINGULAIR) 10 MG tablet Take 10 mg by mouth daily.      . Multiple Vitamins-Minerals (CENTRUM ADULTS PO) Take 1 tablet by mouth daily.    Letta Pate DELICA LANCETS 33G MISC Test sugars twice daily 50 each 11  . predniSONE (DELTASONE) 5 MG tablet Take 10 mg every other day for 2 weeks; then 7.5 mg (1.5) pills daily for 2 weeks, then 7.5 mg every other day. 120 tablet 0   No current facility-administered medications on file prior to visit.       Review of Systems Review of Systems  Constitutional: Negative for fever, appetite change, and unexpected weight change.  Eyes: Negative for pain and visual disturbance.  Respiratory: Negative for cough and shortness of breath.   Cardiovascular: Negative for cp or palpitations    Gastrointestinal: Negative for nausea, diarrhea and constipation.  Genitourinary: Negative for urgency and frequency.  Skin: Negative for pallor or rash   Neurological: Negative for weakness, light-headedness, numbness and headaches.  Hematological: Negative for adenopathy. Does not bruise/bleed easily.  Psychiatric/Behavioral: Negative for dysphoric mood. The patient is not nervous/anxious.         Objective:   Physical Exam    Constitutional: She appears well-developed and well-nourished. No distress.  obese and well appearing   HENT:  Head: Normocephalic and atraumatic.  Mouth/Throat: Oropharynx is clear and moist.  Eyes: Conjunctivae and EOM are normal. Pupils are equal, round, and reactive to light.  Neck: Normal range of motion. Neck supple. No JVD present. Carotid bruit is not present. No thyromegaly present.  Cardiovascular: Normal rate, regular rhythm, normal heart sounds and intact distal pulses.  Exam reveals no gallop.   Pulmonary/Chest: Effort normal and breath sounds normal. No respiratory distress. She has no wheezes. She has no rales.  No crackles  Abdominal: Soft. Bowel sounds are normal. She exhibits no distension, no abdominal bruit and no mass. There is no tenderness.  Musculoskeletal: She exhibits no edema.  Lymphadenopathy:    She has no cervical adenopathy.  Neurological: She is alert. She has normal reflexes.  Skin: Skin is warm and dry. No rash noted.  Psychiatric: She has a normal mood and affect.  Nursing note and vitals reviewed.         Assessment & Plan:   Problem List Items Addressed This Visit    HYPERCHOLESTEROLEMIA    Disc goals for lipids and reasons to control them Rev labs with pt Goal LDL is under 100 Rev low sat fat diet in detail       Obesity    Discussed how this problem influences overall health and the risks it imposes  Reviewed plan for weight loss with lower calorie diet (via better food choices and also portion control or program like weight watchers) and exercise building up to or more than 30 minutes 5 days per week including some aerobic activity         Snoring    With day time somnolence and trouble loosing wt  Suspect sleep apnea Disc sleep med consult- she is thinking about it  Disc risks of OSA Will continue to work on wt loss       Steroid-induced diabetes mellitus - Primary    Lab Results  Component Value Date   HGBA1C 6.3  03/24/2015  in hyperglycemia range now  Doing well with diet control only  Enc wt loss  Rev low glycemic diet

## 2015-05-05 NOTE — Telephone Encounter (Signed)
I did the ref  Will route to [facility]

## 2015-05-05 NOTE — Progress Notes (Signed)
Pre visit review using our clinic review tool, if applicable. No additional management support is needed unless otherwise documented below in the visit note. 

## 2015-05-05 NOTE — Telephone Encounter (Signed)
Patient called and said she was seen earlier today and  would like to be referred to pulmonary for sleep apnea.  Patient prefers Rachael Silva.  Patient can go anytime.

## 2015-05-05 NOTE — Patient Instructions (Signed)
Don't skip meals and get protein with every meal to avoid low sugars  Discuss the possibility of sleep apnea with your pulmonary doctor  A1C is good - keep working on diet and exercise and weight loss  For cholesterol  Avoid red meat/ fried foods/ egg yolks/ fatty breakfast meats/ butter, cheese and high fat dairy/ and shellfish   Blood pressure is better on 2nd check   Follow up in 6 months with labs prior

## 2015-05-06 NOTE — Assessment & Plan Note (Signed)
With day time somnolence and trouble loosing wt  Suspect sleep apnea Disc sleep med consult- she is thinking about it  Disc risks of OSA Will continue to work on wt loss

## 2015-05-06 NOTE — Assessment & Plan Note (Signed)
Lab Results  Component Value Date   HGBA1C 6.3 03/24/2015  in hyperglycemia range now  Doing well with diet control only  Enc wt loss  Rev low glycemic diet

## 2015-05-06 NOTE — Assessment & Plan Note (Signed)
Disc goals for lipids and reasons to control them Rev labs with pt Goal LDL is under 100 Rev low sat fat diet in detail

## 2015-05-06 NOTE — Assessment & Plan Note (Signed)
Discussed how this problem influences overall health and the risks it imposes  Reviewed plan for weight loss with lower calorie diet (via better food choices and also portion control or program like weight watchers) and exercise building up to or more than 30 minutes 5 days per week including some aerobic activity    

## 2015-05-10 DIAGNOSIS — J301 Allergic rhinitis due to pollen: Secondary | ICD-10-CM | POA: Diagnosis not present

## 2015-05-11 ENCOUNTER — Encounter: Payer: Self-pay | Admitting: Internal Medicine

## 2015-05-11 ENCOUNTER — Ambulatory Visit (INDEPENDENT_AMBULATORY_CARE_PROVIDER_SITE_OTHER): Payer: Medicare Other | Admitting: Internal Medicine

## 2015-05-11 VITALS — BP 140/92 | HR 63 | Ht 67.0 in | Wt 276.0 lb

## 2015-05-11 DIAGNOSIS — E669 Obesity, unspecified: Secondary | ICD-10-CM | POA: Diagnosis not present

## 2015-05-11 DIAGNOSIS — R0683 Snoring: Secondary | ICD-10-CM

## 2015-05-11 DIAGNOSIS — R413 Other amnesia: Secondary | ICD-10-CM | POA: Diagnosis not present

## 2015-05-11 DIAGNOSIS — G4719 Other hypersomnia: Secondary | ICD-10-CM | POA: Diagnosis not present

## 2015-05-11 DIAGNOSIS — J4540 Moderate persistent asthma, uncomplicated: Secondary | ICD-10-CM

## 2015-05-11 NOTE — Assessment & Plan Note (Signed)
-  May be contributing to symptoms. Weight loss may be beneficial

## 2015-05-11 NOTE — Assessment & Plan Note (Signed)
-  Mild memory impairment. This may be secondary to sleep apnea.

## 2015-05-11 NOTE — Progress Notes (Signed)
[facility] Pulmonary Medicine Consultation      Assessment and Plan: Snoring -Daytime snoring may be related to check his sleep apnea  Asthma, chronic -The patient has severe asthma which is currently being managed by Dr. Dema Severin. This may be exacerbated by the presence of sleep apnea.  Memory impairment -Mild memory impairment. This may be secondary to sleep apnea.  Obesity -May be contributing to symptoms. Weight loss may be beneficial  Excessive daytime sleepiness -May be due to sleep apnea. We'll refer for sleep study      Date: 05/11/2015  MRN# [id number] Rachael Silva 1945-12-22  Referring Physician: Dr. Milinda Antis.   Rachael Silva is a 69 y.o. old female seen in consultation for daytime sleepiness  CC:  Chief Complaint  Patient presents with  . Advice Only    sleep consult; snores at night; wakes up 1-2 times during night; tired during the day.    HPI:   [name] is a 69 yo female who normally sees Dr. Dema Severin for respiratory issues, she is referred here today due possible sleep apnea.   Sleep Apnea: She presents for a sleep evaluation. She complains of snoring.  Symptoms began several months ago, stable since that time. She goes to sleep at 9 weekdays and 10 weekends. She awakens 7 weekdays and 8 weekends. She falls asleep in 10 minutes.  Collar size 18. She denies snoring.  She notes that she does not wake up refreshed, she is often sleepy during the day. She often finds herself nodding off in the afternoon when doing activities such as using a computer or watching television. She has not had any close calls while at the wheel. She is not fallen asleep while driving    PMHX:   Past Medical History  Diagnosis Date  . Allergy     allergic rhinitis  . Asthma   . Arthritis     OA of knees  . Hiatal hernia   . Hypothyroid   . Labile blood pressure   . Urinary incontinence   . Pneumonia, eosinophilic   . Angioedema 02/2006  . Frequent headaches   . Lung  disease    Surgical Hx:  Past Surgical History  Procedure Laterality Date  . Cesarean section      x2  . Breast surgery  1966    breast biopsy   Family Hx:  Family History  Problem Relation Age of Onset  . Diabetes Mother     pre-diabetic  . Heart disease Mother     ? heart disease  . Emphysema Mother   . Thyroid disease Mother   . Diabetes Maternal Uncle    Social Hx:   History  Substance Use Topics  . Smoking status: Never Smoker   . Smokeless tobacco: Never Used  . Alcohol Use: No   Medication:   Current Outpatient Rx  Name  Route  Sig  Dispense  Refill  . albuterol (ACCUNEB) 0.63 MG/3ML nebulizer solution   Nebulization   Take 1 ampule by nebulization every 6 (six) hours as needed.           Marland Kitchen albuterol (PROVENTIL HFA;VENTOLIN HFA) 108 (90 BASE) MCG/ACT inhaler   Inhalation   Inhale 2 puffs into the lungs every 6 (six) hours as needed for wheezing or shortness of breath.         . B Complex-C-Folic Acid (STRESS B COMPLEX PO)   Oral   Take 1 tablet by mouth daily.         Marland Kitchen  Calcium Carbonate-Vitamin D (CALTRATE 600+D PO)   Oral   Take 1 tablet by mouth 2 (two) times daily.         Marland Kitchen co-enzyme Q-10 30 MG capsule   Oral   Take 200 mg by mouth daily.         . fexofenadine (ALLEGRA ALLERGY) 60 MG tablet   Oral   Take 60 mg by mouth daily.          . Fluticasone-Salmeterol (ADVAIR DISKUS) 250-50 MCG/DOSE AEPB   Inhalation   Inhale 1 puff into the lungs 2 (two) times daily.          . furosemide (LASIX) 20 MG tablet      TAKE 1 TABLET (20 MG TOTAL) BY MOUTH DAILY.   30 tablet   5   . GARLIC PO   Oral   Take 1 capsule by mouth daily.         Marland Kitchen gentamicin ointment (GARAMYCIN) 0.1 %   Topical   Apply 1 application topically 2 (two) times daily.         Marland Kitchen glucose blood (ONE TOUCH ULTRA TEST) test strip      Test sugars twice daily   50 each   12   . ibuprofen (ADVIL,MOTRIN) 800 MG tablet      TAKE 1 TABLET BY MOUTH EVERY 8  HOURS AS NEEDED FOR JOINT PAIN. TAKE WITH FOOD, STOP IT STOMACH UPSET   30 tablet   11   . levothyroxine (SYNTHROID, LEVOTHROID) 50 MCG tablet      TAKE 1 TABLET BY MOUTH DAILY   30 tablet   0   . montelukast (SINGULAIR) 10 MG tablet   Oral   Take 10 mg by mouth daily.           . Multiple Vitamins-Minerals (CENTRUM ADULTS PO)   Oral   Take 1 tablet by mouth daily.         Letta Pate DELICA LANCETS 33G MISC      Test sugars twice daily   50 each   11   . predniSONE (DELTASONE) 5 MG tablet      Take 10 mg every other day for 2 weeks; then 7.5 mg (1.5) pills daily for 2 weeks, then 7.5 mg every other day.   120 tablet   0     Take as directed by Dr. Dema Severin       Allergies:  Erythromycin  Review of Systems: Gen:  Denies  fever, sweats, chills HEENT: Denies blurred vision, double vision. Cvc:  No dizziness, chest pain or heaviness Resp:   Denies cough or sputum porduction, shortness of breath Gi: Denies swallowing difficulty,  Gu:  Denies bladder incontinence, burning urine Ext:   No Joint pain, stiffness or swelling Skin: No skin rash, easy bruising or bleeding or hives Endoc:  No polyuria, polydipsia , polyphagia or weight change Psych: No depression, insomnia or hallucinations  Other:  All other systems negative  Physical Examination:   VS: BP 140/92 mmHg  Pulse 63  Ht 5\' 7"  (1.702 m)  Wt 276 lb (125.193 kg)  BMI 43.22 kg/m2  SpO2 97%  General Appearance: No distress  Neuro:without focal findings,  speech normal,  HEENT: PERRLA, EOM intact.  Mallampati 3 Pulmonary: normal breath sounds, No wheezing, No rales;    CardiovascularNormal S1,S2.  No m/r/g.   Abdomen: Benign, Soft, non-tender. Renal:  No costovertebral tenderness  GU:  No performed at this time. Endoc: No evident  thyromegaly, no signs of acromegaly or Cushing features Skin:   warm, no rashes, no ecchymosis  Extremities: normal, no cyanosis, clubbing, no edema, warm with normal  capillary refill. Other findings:       Orders for this visit: No orders of the defined types were placed in this encounter.     Thank  you for the consultation and for allowing [facility] to assist in the care of your patient. Our recommendations are noted above.  Please contact us if we can be of further service.   Wells Guiles, MD.  Board Certified in Internal Medicine, Pulmonary Medicine, Critical Care Medicine, and Sleep Medicine.  [facility] Office Number: [phone]  [name], M.D.  Stephanie Acre, M.D.  Carolyne Fiscal, M.D

## 2015-05-11 NOTE — Addendum Note (Signed)
Addended by: Meyer Cory R on: 05/11/2015 11:52 AM   Modules accepted: Orders

## 2015-05-11 NOTE — Assessment & Plan Note (Signed)
-  May be due to sleep apnea. We'll refer for sleep study

## 2015-05-11 NOTE — Assessment & Plan Note (Signed)
-  The patient has severe asthma which is currently being managed by Dr. Dema Severin. This may be exacerbated by the presence of sleep apnea.

## 2015-05-11 NOTE — Patient Instructions (Addendum)
Sleep apnea is a serious condition and needs to be diagnosed with a sleep study. He will be referred or a sleep study which is overnight test in the sleep lab. If the sleep study shows sleep apnea, you will need to be referred for a second night in the sleep lab for a CPAP titration. Follow-up with Dr. Dema Severin

## 2015-05-11 NOTE — Assessment & Plan Note (Signed)
-  Daytime snoring may be related to check his sleep apnea

## 2015-05-17 ENCOUNTER — Encounter: Payer: Medicare Other | Attending: Endocrinology

## 2015-05-17 VITALS — Ht 67.0 in | Wt 276.1 lb

## 2015-05-17 DIAGNOSIS — E099 Drug or chemical induced diabetes mellitus without complications: Secondary | ICD-10-CM | POA: Insufficient documentation

## 2015-05-17 DIAGNOSIS — J301 Allergic rhinitis due to pollen: Secondary | ICD-10-CM | POA: Diagnosis not present

## 2015-05-17 NOTE — Progress Notes (Signed)

## 2015-05-26 DIAGNOSIS — J301 Allergic rhinitis due to pollen: Secondary | ICD-10-CM | POA: Diagnosis not present

## 2015-05-27 ENCOUNTER — Encounter: Payer: Self-pay | Admitting: *Deleted

## 2015-05-27 ENCOUNTER — Encounter: Payer: Medicare Other | Admitting: *Deleted

## 2015-05-27 VITALS — Wt 277.4 lb

## 2015-05-27 DIAGNOSIS — E099 Drug or chemical induced diabetes mellitus without complications: Secondary | ICD-10-CM

## 2015-05-27 NOTE — Progress Notes (Signed)

## 2015-05-31 ENCOUNTER — Encounter: Payer: Medicare Other | Admitting: Dietician

## 2015-05-31 VITALS — BP 160/84 | Ht 67.0 in | Wt 277.2 lb

## 2015-05-31 DIAGNOSIS — E099 Drug or chemical induced diabetes mellitus without complications: Secondary | ICD-10-CM | POA: Diagnosis not present

## 2015-05-31 NOTE — Progress Notes (Signed)

## 2015-06-02 DIAGNOSIS — J301 Allergic rhinitis due to pollen: Secondary | ICD-10-CM | POA: Diagnosis not present

## 2015-06-07 ENCOUNTER — Ambulatory Visit (INDEPENDENT_AMBULATORY_CARE_PROVIDER_SITE_OTHER): Payer: Medicare Other | Admitting: Internal Medicine

## 2015-06-07 ENCOUNTER — Encounter: Payer: Self-pay | Admitting: Internal Medicine

## 2015-06-07 VITALS — BP 132/68 | HR 71 | Ht 67.0 in | Wt 275.0 lb

## 2015-06-07 DIAGNOSIS — B4481 Allergic bronchopulmonary aspergillosis: Secondary | ICD-10-CM

## 2015-06-07 DIAGNOSIS — J4540 Moderate persistent asthma, uncomplicated: Secondary | ICD-10-CM | POA: Diagnosis not present

## 2015-06-07 MED ORDER — PREDNISONE 5 MG PO TABS: ORAL_TABLET | ORAL | Status: DC

## 2015-06-07 MED ORDER — OMEPRAZOLE 40 MG PO CPDR: 40.0000 mg | DELAYED_RELEASE_CAPSULE | Freq: Every day | ORAL | Status: DC

## 2015-06-07 NOTE — Progress Notes (Signed)
MRN# [id number] Reginald Mangels [birth year]   CC: Chief Complaint  Patient presents with  . Follow-up    pt c/o prod cough with yellow mucus X5 days.  pt increased prednisone to  qd.  no other complaints today.        Brief History:  06/18/14 IOV /Pulmonary consult   patient is a pleasant 69 yo female with PMHx of Asthma, nasal polyps, possible sinusitis, possible Eosinophilic Pneumonia, Arthritis, seen in consultation for chronic cough.   Patient stated that she started have a cough with yellowish sputum since April of 2015, the mucus production has dissipated a little, but the color can vary from grey to yellow at times.   Over the past year she has had recurrent bout of "bronchitis" treated with multiple rounds of antibiotics and steroids; her last round as 8/19, when she received a Zpak and prednisone for 10 days for recurrent cough with productive sputum production.   She states that her bronchitis episodes last about 1 week, then she get antibiotics and steroids, her symptoms (cough, sputum production, sob), improve; however, about 5 days after completing her antibiotics\steroids her symptoms start to reoccur.   She has seen and allergist (Dr. Clelia Croft) for recurrent allergies (dust) and is on OTC allegra.   She has also seen an ENT physician (Dr. Jenne Campus), for sinusitis symptoms, who stopped her nasal steroid possible due to her nasal polyps.   Patient states that she can walk about 1/2 block before getting short of breath, and cannot climb 1 flight of stairs. She is currently on lasix for leg swelling.   Patient has cats at home. She is a never smoker. 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   Patient has been using her albuterol on a daily basis since July.   07-02-14: presented to [facility] with worsening SOB\DOE, sputum production, noted to have  elevated level of IgE, high suspicion for ABPA, started on prednisone 0.5mg /kg/QD (  daily). She had allergy profile testing performed, serum. Rapid improvement per patient since starting steroids.  At today's visit he states that she is rapidly improve, since starting prednisone which was given to her at her last visit to pulmonology [facility]. Patient is currently on 60 mg of prednisone daily, being treated for suspected ABPA (allergic bronchopulmonary aspergillosis). Should also like to review the results of her recent CT chest scan. Today she endorses mild dyspnea on exertion, mild cough which is nonproductive. IgE >2400, started on high dose tapering steroids at  and schedule for further aspergillus testing  07-15-14   Testing looks like true negative for IgE immediate allergic reaction to aspergillus. Recognize that 60 mg prednisone may mask reaction to unknown degree, although it did not limit reaction to histamine control. Another mold or another hypersensitivity pathway might still cause the syndrome in question.  08-11-14 Patient states that she is doing well today, no complaints of shortness of breath, dyspnea on exertion, productive cough, she does state she has a mild runny nose with clear discharge. Patient is currently on a prednisone taper, she started 40 mg daily on Friday, October 30. Patient states that since starting steroids she has gained approximately 10-12 pounds.  09-23-14 Patient states that she is doing better, no major complaints of sob, doe.  She does have mild productive cough since switching to prednisone  every other  day, but she believes this is due to the weather causing her allergies to act up. Cough with mild yellowish sputum production, in the mornings. She has lost 8 lbs since last visit Has bone density schedule for Jan 2016.   Plan:steroid taper is as follows: Starting September 28, 2014 prednisone 25 mg daily x2 weeks, then 25 mg every other day  x2 weeks then 20 mg daily x2 weeks, then 20 mg every other day x2 weeks, then 15mg  daily x 2weeks, then 15mg  every other day x 2wks  11-27-14   Patient states that she is doing better, no major complaints of sob, doe.  She does have mild productive cough since switching to prednisone 20mg  every other day, but again,she believes this is due to the weather changes causing her allergies to act up. Cough with mild yellowish sputum production, in the mornings, this has not changed since the last visit.   No urgent care\ED visits for cough\sob.   Gained 7lbs since the last visit. Currently on prednisone 20mg  everyother day, scheduled to switch to prednisone 15mg  daily on 11/23/14.   12-29-14 Patient stated that she reduced to prednisone 15 mg every other day, started back having symptoms of cough, sputum production, dyspnea on exertion and wheezing (after the 3rd dose). She increased her dose back to Prednisone 20mg  every other day, and started having resolution of symptoms after the first dose (within 1-2 days).   Currently, with baseline dyspnea, no cough\sputum production. She did state that she has not been using her Advair every day, due to thinking that prednisone was sufficient.   Patient stated that she saw her ENT this morning, had decrease breath sounds, received an albuterol treatment, did not feel much difference.   Plan: prednisone 20mg  everyother day for 3-4 weeks, then 15mg  everyother day until follow up.    ROV 02-02-15 Patient presents for a follow up visit day of ABPA steroid taper. No major symptoms   Down to 15mg  prednisone everyother day, no worsening of symptoms, accompained by daughter. Using Advair twice a day on a regular basis now.    Plan: Continue with  15mg  (3pills) everyother day until 02/18/15, then 10mg  (2pills) daily for 2 wks, then 10mg  everyother day until follow up. We had discussed bronchoscopy early in the diagnosis of ABPA, however, patient has refused  bronchoscopy in the past and decided to forego with steroid treatment and symptomatic management. If we're unable to wean patient from steroids or symptoms return with a steroid reduction dose will need to strongly consider bronchoscopy with BAL for further evaluation of atypical infections, fungal infection. This was discussed with the patient and her daughter at this visit, for both verbalized understanding.  ROV 04/06/15 Patient presents for a follow up visit day of ABPA steroid taper. Mild symptoms of a mild cough  and mild DOE with stir climbing at the end of the day.   Down to 10mg  prednisone everyother day, no worsening of symptoms. Using Advair twice a day on a regular basis now.     Patient states that she is currently off all oral diabetic meds now, she is only on DM monitoring Plan: Currently we are trying to taper her prednisone dose to the lowest possible effective dose that gives her the greatest clinical outcome. Continue with Prednisone 10mg  (2pills) everyother day until 04/11/15, then 7.5mg  (1.5pills) daily for 2 wks, then 7.5mg  everyother day until follow up   We had discussed bronchoscopy early in the diagnosis of ABPA, however,  patient has refused bronchoscopy in the past and decided to forego with steroid treatment and symptomatic management. If we're unable to wean patient from steroids or symptoms return with a steroid reduction dose will need to strongly consider bronchoscopy with BAL for further evaluation of atypical infections, fungal infection. This was discussed with the patient and her daughter at this visit, for both verbalized understanding.  ROV 05/11/15 - sleep study eval She presents for a sleep evaluation. She complains of snoring.  Symptoms began several months ago, stable since that time. She goes to sleep at 9 weekdays and 10 weekends. She awakens 7 weekdays and 8 weekends. She falls asleep in 10 minutes.  Collar size 18. She denies snoring.   She notes that she does  not wake up refreshed, she is often sleepy during the day. She often finds herself nodding off in the afternoon when doing activities such as using a computer or watching television. She has not had any close calls while at the wheel. She is not fallen asleep while driving. Plan: sleep study   Events since last clinic visit: Presents today for a follow up visit of suspected ABPA, was weaned down to 7.5mg  until last Wednesday, started having increased cough with yellow sputum production. She increased her prednisone to 10mg  daily, and her symptoms improved.  Overall with good clinical response to steroids, but difficult and prolonged wean. Saw sleep specialist for OSA eval, has not gotten PSG performed yet.      Medication:   Current Outpatient Rx  Name  Route  Sig  Dispense  Refill  . albuterol (ACCUNEB) 0.63 MG/3ML nebulizer solution   Nebulization   Take 1 ampule by nebulization every 6 (six) hours as needed.           Marland Kitchen albuterol (PROVENTIL HFA;VENTOLIN HFA) 108 (90 BASE) MCG/ACT inhaler   Inhalation   Inhale 2 puffs into the lungs every 6 (six) hours as needed for wheezing or shortness of breath.         . B Complex-C-Folic Acid (STRESS B COMPLEX PO)   Oral   Take 1 tablet by mouth daily.         . Calcium Carbonate-Vitamin D (CALTRATE 600+D PO)   Oral   Take 1 tablet by mouth 2 (two) times daily.         Marland Kitchen co-enzyme Q-10 30 MG capsule   Oral   Take 200 mg by mouth daily.         . fexofenadine (ALLEGRA ALLERGY) 60 MG tablet   Oral   Take 60 mg by mouth daily.          . Fluticasone-Salmeterol (ADVAIR DISKUS) 250-50 MCG/DOSE AEPB   Inhalation   Inhale 1 puff into the lungs 2 (two) times daily.          . furosemide (LASIX) 20 MG tablet      TAKE 1 TABLET (20 MG TOTAL) BY MOUTH DAILY.   30 tablet   5   . GARLIC PO   Oral   Take 1 capsule by mouth daily.         Marland Kitchen gentamicin ointment (GARAMYCIN) 0.1 %   Topical   Apply 1 application topically 2  (two) times daily.         Marland Kitchen glucose blood (ONE TOUCH ULTRA TEST) test strip      Test sugars twice daily   50 each   12   . ibuprofen (ADVIL,MOTRIN) 800 MG tablet  TAKE 1 TABLET BY MOUTH EVERY 8 HOURS AS NEEDED FOR JOINT PAIN. TAKE WITH FOOD, STOP IT STOMACH UPSET   30 tablet   11   . levothyroxine (SYNTHROID, LEVOTHROID) 50 MCG tablet      TAKE 1 TABLET BY MOUTH DAILY   30 tablet   0   . montelukast (SINGULAIR) 10 MG tablet   Oral   Take 10 mg by mouth daily.           . Multiple Vitamins-Minerals (CENTRUM ADULTS PO)   Oral   Take 1 tablet by mouth daily.         Letta Pate DELICA LANCETS 33G MISC      Test sugars twice daily   50 each   11   . predniSONE (DELTASONE) 5 MG tablet      Take 10 mg every other day for 2 weeks; then 7.5 mg (1.5) pills daily for 2 weeks, then 7.5 mg every other day. Patient taking differently: Pt currently taking 10mg  qd   120 tablet   0     Take as directed by Dr. Dema Severin      Review of Systems: Gen:  Denies  fever, sweats, chills HEENT: Denies blurred vision, double vision, ear pain, eye pain, hearing loss, nose bleeds, sore throat Cvc:  No dizziness, chest pain or heaviness Resp:   Admits ZO:XWRUE, yellow sputum production, DOE (stable).  Gi: Denies swallowing difficulty, stomach pain, nausea or vomiting, diarrhea, constipation, bowel incontinence Gu:  Denies bladder incontinence, burning urine Ext:   No Joint pain, stiffness or swelling Skin: No skin rash, easy bruising or bleeding or hives Endoc:  No polyuria, polydipsia , polyphagia or weight change Other:  All other systems negative  Allergies:  Erythromycin  Physical Examination:  VS: BP 132/68 mmHg  Pulse 71  Ht 5\' 7"  (1.702 m)  Wt 275 lb (124.739 kg)  BMI 43.06 kg/m2  SpO2 97%  General Appearance: No distress  HEENT: PERRLA, no ptosis, no other lesions noticed Pulmonary:normal breath sounds., diaphragmatic excursion normal.No wheezing, No rales    Cardiovascular:  Normal S1,S2.  No m/r/g.     Abdomen:Exam: Benign, Soft, non-tender, No masses  Skin:   warm, no rashes, no ecchymosis  Extremities: normal, no cyanosis, clubbing, warm with normal capillary refill.       Assessment and Plan: Asthma, chronic Recurrent exacerbation during the first half of 2015 with elevated serum eosinophils and abnormal CT  Aspergillus IgE panel neg w/ low positive titers for Asp Nidulans, Flavus and Terreus and amstel.  She has an elevated total IgE level, CT findings with mild bronchiectasis, groundglass opacities-given her history of asthma and his current lab and radiographic abnormalities there is a high suspicion for ABPA. Currently being treated as ABPA, with steroid only.   Currently we are trying to taper her prednisone dose to the lowest possible effective dose that gives her the greatest clinical outcome. I believe the patient will be a slow prednisone wean given the sudden recurrence of symptoms when prednisone was decreased from 20 mg every other day to 15 mg every other day.  Plan -Continue Advair as directed - continue with 10mg  of Prednisone until 06/09/15, then starting 06/11/15 goto 10mg  prednisone everyother day for 1 month (entire September), then start prednisone 7.5mg  every other day on 07/10/15. - if with weaning, then go back to the last effective dose. - omeprazole 40mg , 1 pill at night with water only.  ABPA (allergic bronchopulmonary aspergillosis) Again this is a working diagnosis, other differentials include hypersensitivity pneumonitis, eosinophilic pneumonias, NSIP, BOOP. All of these diagnoses are treated with high-dose steroids for a prolonged period of time. She does have significant clinical improvement once steroids were initiated, with rapid decline when steroids are terminated. For further workup with Aspergillus skin testing, IgG levels, IgA levels were within normal limits.  The results of the skin  testing and immunoglobulin testing were discussed in detail with the patient, they reviewed by me personally, and the patient was given a copy of her results. Patient will require prolonged course of steroids ,possibly 3-6 months, Initially started 07/2014 Pulmonary will taper the steroids.  Patient educated on side effects of steroids (weight gain, wereretention, increased appetite, muscle weakness, bone degradation, dysfunctional calcium levels, elevated blood glucose levels, anxiety/agitation at night, decreased immune).  Will continue to taper steroid doses to the lowest possible dose that will provide symptomatic relief, the premise is to completely taper steroids off. Given her negative Aspergillus skin testing (this was done while patient on high-dose steroids, which could also give a false negative read), at some point in the future if steroids can be tapered off or patient has relapse of symptoms, will then consider evaluating her environment for mold testing and possible bronchoscopy, this was discussed at today's visit.   Patient educated on the current diagnosis, she understands that this is not an active infection, it is most likely a hyperreactive reaction to antigen exposure. She does not need/require antifungal treatment at this time. This is her initial treatment course, if she has refractory or recurrent symptoms will consider adding antifungals at that time.  Patient wean to 7.5mg  daily, until last Wednesday, when the weather changed (more humid and hot), and her symptoms became worst, necessitating increase in Prednisone back to 10mg  daily.   Currently we are trying to taper her prednisone dose to the lowest possible effective dose that gives her the greatest clinical outcome. Continue with Prednisone 10mg  of Prednisone until 06/09/15, then starting 06/11/15 goto 10mg  prednisone everyother day for 1 month (entire September), then start prednisone 7.5mg  every other day on 07/10/15. We  had discussed bronchoscopy early in the diagnosis of ABPA, however, patient has refused bronchoscopy in the past and decided to forego with steroid treatment and symptomatic management. If we're unable to wean patient from steroids or symptoms return with a steroid reduction dose will need to strongly consider bronchoscopy with BAL for further evaluation of atypical infections, fungal infection.     Updated Medication List Outpatient Encounter Prescriptions as of 06/07/2015  Medication Sig  . albuterol (ACCUNEB) 0.63 MG/3ML nebulizer solution Take 1 ampule by nebulization every 6 (six) hours as needed.    Marland Kitchen albuterol (PROVENTIL HFA;VENTOLIN HFA) 108 (90 BASE) MCG/ACT inhaler Inhale 2 puffs into the lungs every 6 (six) hours as needed for wheezing or shortness of breath.  . B Complex-C-Folic Acid (STRESS B COMPLEX PO) Take 1 tablet by mouth daily.  . Calcium Carbonate-Vitamin D (CALTRATE 600+D PO) Take 1 tablet by mouth 2 (two) times daily.  Marland Kitchen co-enzyme Q-10 30 MG capsule Take 200 mg by mouth daily.  . fexofenadine (ALLEGRA ALLERGY) 60 MG tablet Take 60 mg by mouth daily.   . Fluticasone-Salmeterol (ADVAIR DISKUS) 250-50 MCG/DOSE AEPB Inhale 1 puff into the lungs 2 (two) times daily.   . furosemide (LASIX) 20 MG tablet TAKE 1 TABLET (20 MG TOTAL) BY MOUTH DAILY.  Marland Kitchen GARLIC PO Take 1 capsule by mouth  daily.  . gentamicin ointment (GARAMYCIN) 0.1 % Apply 1 application topically 2 (two) times daily.  Marland Kitchen glucose blood (ONE TOUCH ULTRA TEST) test strip Test sugars twice daily  . ibuprofen (ADVIL,MOTRIN) 800 MG tablet TAKE 1 TABLET BY MOUTH EVERY 8 HOURS AS NEEDED FOR JOINT PAIN. TAKE WITH FOOD, STOP IT STOMACH UPSET  . levothyroxine (SYNTHROID, LEVOTHROID) 50 MCG tablet TAKE 1 TABLET BY MOUTH DAILY  . montelukast (SINGULAIR) 10 MG tablet Take 10 mg by mouth daily.    . Multiple Vitamins-Minerals (CENTRUM ADULTS PO) Take 1 tablet by mouth daily.  Letta Pate DELICA LANCETS 33G MISC Test sugars twice  daily  . predniSONE (DELTASONE) 5 MG tablet Pt currently taking 10mg  qd  . [DISCONTINUED] predniSONE (DELTASONE) 5 MG tablet Take 10 mg every other day for 2 weeks; then 7.5 mg (1.5) pills daily for 2 weeks, then 7.5 mg every other day. (Patient taking differently: Pt currently taking 10mg  qd)  . omeprazole (PRILOSEC) 40 MG capsule Take 1 capsule (40 mg total) by mouth daily.   No facility-administered encounter medications on file as of 06/07/2015.    Orders for this visit: No orders of the defined types were placed in this encounter.    Thank  you for the visitation and for allowing  [facility] to assist in the care of your patient. Our recommendations are noted above.  Please contact us if we can be of further service.  Stephanie Acre, MD [facility] Office Number: [phone]

## 2015-06-07 NOTE — Patient Instructions (Addendum)
Follow up with Dr. Dema Severin in 2 months - continue with  of Prednisone until 06/09/15, then starting 06/11/15 goto  prednisone everyother day for 1 month (entire September), then start prednisone 7.5mg  every other day on 07/10/15. - if your symptoms get worst with weaning, then go back to the last effective dose. - omeprazole , 1 pill at night with water only.  - please obtain your sleep study.

## 2015-06-07 NOTE — Assessment & Plan Note (Signed)
Recurrent exacerbation during the first half of 2015 with elevated serum eosinophils and abnormal CT  Aspergillus IgE panel neg w/ low positive titers for Asp Nidulans, Flavus and Terreus and amstel.  She has an elevated total IgE level, CT findings with mild bronchiectasis, groundglass opacities-given her history of asthma and his current lab and radiographic abnormalities there is a high suspicion for ABPA. Currently being treated as ABPA, with steroid only.   Currently we are trying to taper her prednisone dose to the lowest possible effective dose that gives her the greatest clinical outcome. I believe the patient will be a slow prednisone wean given the sudden recurrence of symptoms when prednisone was decreased from 20 mg every other day to 15 mg every other day.  Plan -Continue Advair as directed - continue with  of Prednisone until 06/09/15, then starting 06/11/15 goto  prednisone everyother day for 1 month (entire September), then start prednisone 7.5mg  every other day on 07/10/15. - if with weaning, then go back to the last effective dose. - omeprazole , 1 pill at night with water only.

## 2015-06-07 NOTE — Assessment & Plan Note (Addendum)
Again this is a working diagnosis, other differentials include hypersensitivity pneumonitis, eosinophilic pneumonias, NSIP, BOOP. All of these diagnoses are treated with high-dose steroids for a prolonged period of time. She does have significant clinical improvement once steroids were initiated, with rapid decline when steroids are terminated. For further workup with Aspergillus skin testing, IgG levels, IgA levels were within normal limits.  The results of the skin testing and immunoglobulin testing were discussed in detail with the patient, they reviewed by me personally, and the patient was given a copy of her results. Patient will require prolonged course of steroids ,possibly 3-6 months, Initially started 07/2014 Pulmonary will taper the steroids.  Patient educated on side effects of steroids (weight gain, wereretention, increased appetite, muscle weakness, bone degradation, dysfunctional calcium levels, elevated blood glucose levels, anxiety/agitation at night, decreased immune).  Will continue to taper steroid doses to the lowest possible dose that will provide symptomatic relief, the premise is to completely taper steroids off. Given her negative Aspergillus skin testing (this was done while patient on high-dose steroids, which could also give a false negative read), at some point in the future if steroids can be tapered off or patient has relapse of symptoms, will then consider evaluating her environment for mold testing and possible bronchoscopy, this was discussed at today's visit.   Patient educated on the current diagnosis, she understands that this is not an active infection, it is most likely a hyperreactive reaction to antigen exposure. She does not need/require antifungal treatment at this time. This is her initial treatment course, if she has refractory or recurrent symptoms will consider adding antifungals at that time.  Patient wean to 7.5mg  daily, until last Wednesday, when the  weather changed (more humid and hot), and her symptoms became worst, necessitating increase in Prednisone back to  daily.   Currently we are trying to taper her prednisone dose to the lowest possible effective dose that gives her the greatest clinical outcome. Continue with Prednisone  of Prednisone until 06/09/15, then starting 06/11/15 goto  prednisone everyother day for 1 month (entire September), then start prednisone 7.5mg  every other day on 07/10/15. We had discussed bronchoscopy early in the diagnosis of ABPA, however, patient has refused bronchoscopy in the past and decided to forego with steroid treatment and symptomatic management. If we're unable to wean patient from steroids or symptoms return with a steroid reduction dose will need to strongly consider bronchoscopy with BAL for further evaluation of atypical infections, fungal infection.

## 2015-06-09 DIAGNOSIS — J301 Allergic rhinitis due to pollen: Secondary | ICD-10-CM | POA: Diagnosis not present

## 2015-06-11 ENCOUNTER — Encounter: Payer: Self-pay | Admitting: *Deleted

## 2015-06-11 DIAGNOSIS — J301 Allergic rhinitis due to pollen: Secondary | ICD-10-CM | POA: Diagnosis not present

## 2015-06-16 ENCOUNTER — Ambulatory Visit: Payer: Medicare Other | Attending: Internal Medicine

## 2015-06-16 DIAGNOSIS — J301 Allergic rhinitis due to pollen: Secondary | ICD-10-CM | POA: Diagnosis not present

## 2015-06-16 DIAGNOSIS — G4733 Obstructive sleep apnea (adult) (pediatric): Secondary | ICD-10-CM | POA: Insufficient documentation

## 2015-06-17 DIAGNOSIS — G4733 Obstructive sleep apnea (adult) (pediatric): Secondary | ICD-10-CM | POA: Diagnosis not present

## 2015-06-24 ENCOUNTER — Telehealth: Payer: Self-pay | Admitting: Internal Medicine

## 2015-06-24 DIAGNOSIS — G4733 Obstructive sleep apnea (adult) (pediatric): Secondary | ICD-10-CM

## 2015-06-24 DIAGNOSIS — J301 Allergic rhinitis due to pollen: Secondary | ICD-10-CM | POA: Diagnosis not present

## 2015-06-24 NOTE — Telephone Encounter (Signed)
Spoke with pt and informed VM is out of office but as soon as he responds I will call her back. Pt states that is fine. Will await response.

## 2015-06-24 NOTE — Telephone Encounter (Signed)
Patient received notice on MyChart that her sleep study results are back.  Requesting results of study Dr. Dema Severin, please advise.

## 2015-06-25 ENCOUNTER — Telehealth: Payer: Self-pay | Admitting: *Deleted

## 2015-06-25 DIAGNOSIS — G4733 Obstructive sleep apnea (adult) (pediatric): Secondary | ICD-10-CM

## 2015-06-25 NOTE — Telephone Encounter (Signed)
Order placed

## 2015-06-25 NOTE — Telephone Encounter (Signed)
Pt informed of results. Will place order for CPAP titration per VM. Nothing further needed.

## 2015-06-25 NOTE — Telephone Encounter (Signed)
Please inform patient that she has mild to moderate Sleep Apnea. Her AHI =14.1, this puts her in the high mild, early moderate level of OSA.  Given that information, I think it would be best to start to cpap, in addition to weight loss (which she is already trying).  If patient decides to go with cpap, then the order will be:  autopap (5-15cm H2O), nightly and with naps. Send order to [facility]  Please make sure patient has follow up with me, and she can start her cpap prior to seeing me.   Thank you Dr. Dema Severin

## 2015-06-25 NOTE — Telephone Encounter (Signed)
Order placed incorrectly. Re-entered order.

## 2015-06-25 NOTE — Telephone Encounter (Signed)
Per [name] - will hold until Dr. Dema Severin return to office.

## 2015-06-28 ENCOUNTER — Telehealth: Payer: Self-pay | Admitting: *Deleted

## 2015-06-28 DIAGNOSIS — Z7952 Long term (current) use of systemic steroids: Secondary | ICD-10-CM

## 2015-06-28 DIAGNOSIS — B4481 Allergic bronchopulmonary aspergillosis: Secondary | ICD-10-CM

## 2015-06-28 NOTE — Telephone Encounter (Signed)
-----   Message from Stephanie Acre, MD sent at 06/28/2015  8:57 AM EDT ----- Regarding: labs If possible, please obtain with CBC with Diff and a serum IgE level on this patient prior to her next visit.  It is for her ABPA diagnosis and monitoring of steroid level.   Thank you, Dr. Dema Severin

## 2015-06-28 NOTE — Telephone Encounter (Signed)
Pt informed of labs needed and will go prior to next appt in Oct. Nothing further needed.

## 2015-07-01 DIAGNOSIS — J301 Allergic rhinitis due to pollen: Secondary | ICD-10-CM | POA: Diagnosis not present

## 2015-07-02 ENCOUNTER — Telehealth: Payer: Self-pay | Admitting: *Deleted

## 2015-07-02 DIAGNOSIS — G4733 Obstructive sleep apnea (adult) (pediatric): Secondary | ICD-10-CM

## 2015-07-02 NOTE — Telephone Encounter (Signed)
CPAP titration study ordered per DR. 

## 2015-07-07 DIAGNOSIS — J301 Allergic rhinitis due to pollen: Secondary | ICD-10-CM | POA: Diagnosis not present

## 2015-07-09 ENCOUNTER — Ambulatory Visit (INDEPENDENT_AMBULATORY_CARE_PROVIDER_SITE_OTHER): Payer: BLUE CROSS/BLUE SHIELD

## 2015-07-09 DIAGNOSIS — Z23 Encounter for immunization: Secondary | ICD-10-CM

## 2015-07-12 ENCOUNTER — Other Ambulatory Visit: Payer: Self-pay | Admitting: Family Medicine

## 2015-07-13 ENCOUNTER — Encounter: Payer: Self-pay | Admitting: Internal Medicine

## 2015-07-15 DIAGNOSIS — J301 Allergic rhinitis due to pollen: Secondary | ICD-10-CM | POA: Diagnosis not present

## 2015-07-21 DIAGNOSIS — J301 Allergic rhinitis due to pollen: Secondary | ICD-10-CM | POA: Diagnosis not present

## 2015-07-29 DIAGNOSIS — J301 Allergic rhinitis due to pollen: Secondary | ICD-10-CM | POA: Diagnosis not present

## 2015-08-05 ENCOUNTER — Other Ambulatory Visit
Admission: RE | Admit: 2015-08-05 | Discharge: 2015-08-05 | Disposition: A | Discharge status: Home / Self Care | Payer: Medicare Other | Source: Ambulatory Visit | Attending: Internal Medicine | Admitting: Internal Medicine

## 2015-08-05 DIAGNOSIS — Z7952 Long term (current) use of systemic steroids: Secondary | ICD-10-CM | POA: Diagnosis not present

## 2015-08-05 DIAGNOSIS — E7800 Pure hypercholesterolemia, unspecified: Secondary | ICD-10-CM | POA: Insufficient documentation

## 2015-08-05 DIAGNOSIS — J301 Allergic rhinitis due to pollen: Secondary | ICD-10-CM | POA: Diagnosis not present

## 2015-08-05 DIAGNOSIS — B4481 Allergic bronchopulmonary aspergillosis: Secondary | ICD-10-CM | POA: Diagnosis not present

## 2015-08-05 DIAGNOSIS — E099 Drug or chemical induced diabetes mellitus without complications: Secondary | ICD-10-CM | POA: Insufficient documentation

## 2015-08-05 DIAGNOSIS — T380X5A Adverse effect of glucocorticoids and synthetic analogues, initial encounter: Secondary | ICD-10-CM | POA: Diagnosis not present

## 2015-08-05 LAB — LIPID PANEL
CHOL/HDL RATIO: 4 ratio
CHOLESTEROL: 242 mg/dL — AB (ref 0–200)
HDL: 60 mg/dL (ref >40)
LDL Cholesterol: 165 mg/dL — ABNORMAL HIGH (ref 0–99)
TRIGLYCERIDES: 83 mg/dL (ref <150)
VLDL: 17 mg/dL (ref 0–40)

## 2015-08-05 LAB — COMPREHENSIVE METABOLIC PANEL
ALT: 14 U/L (ref 14–54)
AST: 20 U/L (ref 15–41)
Albumin: 3.9 g/dL (ref 3.5–5.0)
Alkaline Phosphatase: 78 U/L (ref 38–126)
Anion gap: 7 (ref 5–15)
BUN: 16 mg/dL (ref 6–20)
CHLORIDE: 106 mmol/L (ref 101–111)
CO2: 26 mmol/L (ref 22–32)
CREATININE: 1.1 mg/dL — AB (ref 0.44–1.00)
Calcium: 9 mg/dL (ref 8.9–10.3)
GFR, EST AFRICAN AMERICAN: 59 mL/min — AB (ref >60)
GFR, EST NON AFRICAN AMERICAN: 51 mL/min — AB (ref >60)
Glucose, Bld: 106 mg/dL — ABNORMAL HIGH (ref 65–99)
POTASSIUM: 3.6 mmol/L (ref 3.5–5.1)
Sodium: 139 mmol/L (ref 135–145)
TOTAL PROTEIN: 7.5 g/dL (ref 6.5–8.1)
Total Bilirubin: 0.5 mg/dL (ref 0.3–1.2)

## 2015-08-05 LAB — CBC WITH DIFFERENTIAL/PLATELET
BASOS ABS: 0.1 10*3/uL (ref 0–0.1)
Basophils Relative: 1 %
EOS ABS: 0.6 10*3/uL (ref 0–0.7)
EOS PCT: 10 %
HCT: 44.1 % (ref 35.0–47.0)
HEMOGLOBIN: 15.1 g/dL (ref 12.0–16.0)
LYMPHS ABS: 1.6 10*3/uL (ref 1.0–3.6)
Lymphocytes Relative: 26 %
MCH: 30.2 pg (ref 26.0–34.0)
MCHC: 34.2 g/dL (ref 32.0–36.0)
MCV: 88.3 fL (ref 80.0–100.0)
Monocytes Absolute: 0.3 10*3/uL (ref 0.2–0.9)
Monocytes Relative: 5 %
NEUTROS PCT: 58 %
Neutro Abs: 3.6 10*3/uL (ref 1.4–6.5)
PLATELETS: 199 10*3/uL (ref 150–440)
RBC: 5 MIL/uL (ref 3.80–5.20)
RDW: 13.1 % (ref 11.5–14.5)
WBC: 6.2 10*3/uL (ref 3.6–11.0)

## 2015-08-06 LAB — HEMOGLOBIN A1C: HEMOGLOBIN A1C: 6.4 % — AB (ref 4.0–6.0)

## 2015-08-06 LAB — IGE: IgE (Immunoglobulin E), Serum: 1253 IU/mL — ABNORMAL HIGH (ref 0–100)

## 2015-08-11 ENCOUNTER — Ambulatory Visit (INDEPENDENT_AMBULATORY_CARE_PROVIDER_SITE_OTHER): Payer: Medicare Other | Admitting: Internal Medicine

## 2015-08-11 ENCOUNTER — Encounter: Payer: Self-pay | Admitting: Internal Medicine

## 2015-08-11 VITALS — BP 124/78 | HR 70 | Ht 67.0 in | Wt 275.0 lb

## 2015-08-11 DIAGNOSIS — B4481 Allergic bronchopulmonary aspergillosis: Secondary | ICD-10-CM

## 2015-08-11 DIAGNOSIS — J4540 Moderate persistent asthma, uncomplicated: Secondary | ICD-10-CM | POA: Diagnosis not present

## 2015-08-11 DIAGNOSIS — R05 Cough: Secondary | ICD-10-CM

## 2015-08-11 DIAGNOSIS — R058 Other specified cough: Secondary | ICD-10-CM

## 2015-08-11 MED ORDER — PREDNISONE 10 MG PO TABS: 10.0000 mg | ORAL_TABLET | Freq: Every day | ORAL | Status: DC

## 2015-08-11 NOTE — Patient Instructions (Signed)
Follow up with Dr. Dema SeverinMungal in: Dec 2016 -Prednisone 10 mg daily until follow-up visit -Given your recurrent cough, sputum production, unable to wean prednisone, we will obtain a CT scan with contrast and then set up a bronchoscopy with BAL and possible transbronchial biopsy. -Continue with current inhalers -Continue allergy regiment

## 2015-08-11 NOTE — Assessment & Plan Note (Signed)
Recurrent exacerbation during the first half of 2015 with elevated serum eosinophils and abnormal CT  Aspergillus IgE panel neg w/ low positive titers for Asp Nidulans, Flavus and Terreus and amstel.  She has an elevated total IgE level, CT findings with mild bronchiectasis, groundglass opacities-given her history of asthma and his current lab and radiographic abnormalities there is a high suspicion for ABPA. Currently being treated as ABPA, with steroid only.   Currently we are trying to taper her prednisone dose to the lowest possible effective dose that gives her the greatest clinical outcome. Patient got down to 7.5 mg every other day, but is still having moderate amount of symptoms of cough, runny nose, sputum production, and shortness of breath. At today's visit we have decided to move forward with a bunk [place] with BAL and possible transbronchial biopsies to evaluate for further causes of chronic cough recurrent eosinophilic pneumonia.  Plan - Continue Advair as directed - continue with 10mg  of Prednisone until follow-up visit - CT chest with contrast, followed by bronchoscopy with BAL and possible transbronchial biopsy - omeprazole 40mg , 1 pill at night with water only.

## 2015-08-11 NOTE — Assessment & Plan Note (Addendum)
Again this is a working diagnosis, other differentials include hypersensitivity pneumonitis, eosinophilic pneumonias, NSIP, BOOP. All of these diagnoses are treated with high-dose steroids for a prolonged period of time. She does have significant clinical improvement once steroids were initiated, with rapid decline when steroids are terminated. For further workup with Aspergillus skin testing, IgG levels, IgA levels were within normal limits.  The results of the skin testing and immunoglobulin testing were discussed in detail with the patient, they reviewed by me personally, and the patient was given a copy of her results. Patient will require prolonged course of steroids ,possibly 3-6 months, Initially started 07/2014 Pulmonary will taper the steroids.  Patient educated on side effects of steroids (weight gain, wereretention, increased appetite, muscle weakness, bone degradation, dysfunctional calcium levels, elevated blood glucose levels, anxiety/agitation at night, decreased immune).  Will continue to taper steroid doses to the lowest possible dose that will provide symptomatic relief, the premise is to completely taper steroids off. Given her negative Aspergillus skin testing (this was done while patient on high-dose steroids, which could also give a false negative read), at some point in the future if steroids can be tapered off or patient has relapse of symptoms. Patient educated on the current diagnosis, she understands that this is not an active infection, it is most likely a hyperreactive reaction to antigen exposure. She does not need/require antifungal treatment at this time. This is her initial treatment course, if she has refractory or recurrent symptoms will consider adding antifungals at that time.  Plan: -Patient has been on steroids chronically for the past one year, unable to wean off of steroids, at this time we discussed further evaluation and workup and need to rule out fungal and  other atypical infections with a bronchoscopy with BAL. Patient is in agreement with this, also discussed transbronchial biopsy, which she is in agreement with. -We will plan for a CT chest with contrast prior to bronchoscopy and then move forward with bronchoscopy

## 2015-08-11 NOTE — Progress Notes (Signed)
MRN# [id number] Rachael Silva September 13, 1946   CC: Chief Complaint  Patient presents with  . Follow-up    ABPA. pt. states breathing is baseline. occ. SOB. occ. wheezing. occ. cough prod. clear to yellow in color. denies chest pain/tightness. feels like she is getting a cold      Brief History:  06/18/14 IOV /Pulmonary consult   patient is a pleasant 69 yo female with PMHx of Asthma, nasal polyps, possible sinusitis, possible Eosinophilic Pneumonia, Arthritis, seen in consultation for chronic cough.   Patient stated that she started have a cough with yellowish sputum since April of 2015, the mucus production has dissipated a little, but the color can vary from grey to yellow at times.   Over the past year she has had recurrent bout of "bronchitis" treated with multiple rounds of antibiotics and steroids; her last round as 8/19, when she received a Zpak and prednisone for 10 days for recurrent cough with productive sputum production.   She states that her bronchitis episodes last about 1 week, then she get antibiotics and steroids, her symptoms (cough, sputum production, sob), improve; however, about 5 days after completing her antibiotics\steroids her symptoms start to reoccur.   She has seen and allergist (Dr. Clelia Croft) for recurrent allergies (dust) and is on OTC allegra.   She has also seen an ENT physician (Dr. Jenne Campus), for sinusitis symptoms, who stopped her nasal steroid possible due to her nasal polyps.   Patient states that she can walk about 1/2 block before getting short of breath, and cannot climb 1 flight of stairs. She is currently on lasix for leg swelling.   Patient has cats at home. She is a never smoker. 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   Patient has been using her albuterol on a daily basis since July.   07-02-14: presented to [facility] with worsening SOB\DOE, sputum production, noted to have elevated level of IgE, high suspicion for ABPA, started on prednisone 0.5mg /kg/QD (60mg  daily). She had allergy profile testing performed, serum. Rapid improvement per patient since starting steroids.  At today's visit he states that she is rapidly improve, since starting prednisone which was given to her at her last visit to pulmonology [facility]. Patient is currently on 60 mg of prednisone daily, being treated for suspected ABPA (allergic bronchopulmonary aspergillosis). Should also like to review the results of her recent CT chest scan. Today she endorses mild dyspnea on exertion, mild cough which is nonproductive. IgE >2400, started on high dose tapering steroids at 60mg  and schedule for further aspergillus testing  07-15-14   Testing looks like true negative for IgE immediate allergic reaction to aspergillus. Recognize that 60 mg prednisone may mask reaction to unknown degree, although it did not limit reaction to histamine control. Another mold or another hypersensitivity pathway might still cause the syndrome in question.  08-11-14 Patient states that she is doing well today, no complaints of shortness of breath, dyspnea on exertion, productive cough, she does state she has a mild runny nose with clear discharge. Patient is currently on a prednisone taper, she started 40 mg daily on Friday, October 30. Patient states that since starting steroids she has gained approximately 10-12 pounds.  09-23-14 Patient states that she is doing better, no major complaints of sob, doe.  She does have mild productive cough since switching  to prednisone  every other day, but she believes this is due to the weather causing her allergies to act up. Cough with mild yellowish sputum production, in the mornings. She has lost 8 lbs since last visit Has bone density schedule for Jan 2016.   Plan:steroid taper is as follows: Starting September 28, 2014 prednisone 25 mg daily x2 weeks, then 25 mg every other day x2 weeks then 20 mg daily x2 weeks, then 20 mg every other day x2 weeks, then  daily x 2weeks, then  every other day x 2wks  11-27-14   Patient states that she is doing better, no major complaints of sob, doe.  She does have mild productive cough since switching to prednisone  every other day, but again,she believes this is due to the weather changes causing her allergies to act up. Cough with mild yellowish sputum production, in the mornings, this has not changed since the last visit.   No urgent care\ED visits for cough\sob.   Gained 7lbs since the last visit. Currently on prednisone  everyother day, scheduled to switch to prednisone  daily on 11/23/14.   12-29-14 Patient stated that she reduced to prednisone 15 mg every other day, started back having symptoms of cough, sputum production, dyspnea on exertion and wheezing (after the 3rd dose). She increased her dose back to Prednisone  every other day, and started having resolution of symptoms after the first dose (within 1-2 days).   Currently, with baseline dyspnea, no cough\sputum production. She did state that she has not been using her Advair every day, due to thinking that prednisone was sufficient.   Patient stated that she saw her ENT this morning, had decrease breath sounds, received an albuterol treatment, did not feel much difference.   Plan: prednisone  everyother day for 3-4 weeks, then  everyother day until follow up.    ROV 02-02-15 Patient presents for a follow up visit day of ABPA steroid taper. No major symptoms   Down to  prednisone everyother day, no worsening of symptoms, accompained by daughter. Using Advair twice a day on a regular basis now.    Plan: Continue with   (3pills) everyother day until 02/18/15, then  (2pills) daily for 2 wks, then  everyother day until follow up. We had discussed bronchoscopy  early in the diagnosis of ABPA, however, patient has refused bronchoscopy in the past and decided to forego with steroid treatment and symptomatic management. If we're unable to wean patient from steroids or symptoms return with a steroid reduction dose will need to strongly consider bronchoscopy with BAL for further evaluation of atypical infections, fungal infection. This was discussed with the patient and her daughter at this visit, for both verbalized understanding.  ROV 04/06/15 Patient presents for a follow up visit day of ABPA steroid taper. Mild symptoms of a mild cough  and mild DOE with stir climbing at the end of the day.   Down to  prednisone everyother day, no worsening of symptoms. Using Advair twice a day on a regular basis now.     Patient states that she is currently off all oral diabetic meds now, she is only on DM monitoring Plan: Currently we are trying to taper her prednisone dose to the lowest possible effective dose that gives her the greatest clinical outcome. Continue with Prednisone  (2pills) everyother day until 04/11/15, then 7.5mg  (1.5pills) daily for 2 wks, then 7.5mg  everyother day until follow up   We had discussed bronchoscopy early in  the diagnosis of ABPA, however, patient has refused bronchoscopy in the past and decided to forego with steroid treatment and symptomatic management. If we're unable to wean patient from steroids or symptoms return with a steroid reduction dose will need to strongly consider bronchoscopy with BAL for further evaluation of atypical infections, fungal infection. This was discussed with the patient and her daughter at this visit, for both verbalized understanding.  ROV 05/11/15 - sleep study eval She presents for a sleep evaluation. She complains of snoring.  Symptoms began several months ago, stable since that time. She goes to sleep at 9 weekdays and 10 weekends. She awakens 7 weekdays and 8 weekends. She falls asleep in 10 minutes.   Collar size 18. She denies snoring.   She notes that she does not wake up refreshed, she is often sleepy during the day. She often finds herself nodding off in the afternoon when doing activities such as using a computer or watching television. She has not had any close calls while at the wheel. She is not fallen asleep while driving. Plan: sleep study   ROV 06/07/15 Presents today for a follow up visit of suspected ABPA, was weaned down to 7.5mg  until last Wednesday, started having increased cough with yellow sputum production. She increased her prednisone to 10mg  daily, and her symptoms improved.  Overall with good clinical response to steroids, but difficult and prolonged wean. Saw sleep specialist for OSA eval, has not gotten PSG performed yet.   Plan: Patient wean to 7.5mg  daily, until last Wednesday, when the weather changed (more humid and hot), and her symptoms became worst, necessitating increase in Prednisone back to 10mg  daily.   Currently we are trying to taper her prednisone dose to the lowest possible effective dose that gives her the greatest clinical outcome. Continue with Prednisone 10mg  of Prednisone until 06/09/15, then starting 06/11/15 goto 10mg  prednisone everyother day for 1 month (entire September), then start prednisone 7.5mg  every other day on 07/10/15. We had discussed bronchoscopy early in the diagnosis of ABPA, however, patient has refused bronchoscopy in the past and decided to forego with steroid treatment and symptomatic management. If we're unable to wean patient from steroids or symptoms return with a steroid reduction dose will need to strongly consider bronchoscopy with BAL for further evaluation of atypical infections, fungal infection.   Events since last clinic visit: She presents today for follow-up visit of chronic eosinophilic pneumonia currently on chronic steroid therapy. States that over the last couple days she's had watery eyes, nasal congestion, runny  nose, nonproductive cough, and some mild wheezing this morning. About 2 weeks ago she was on prednisone 7-1/2 mg every day, and now she is transitioned to prednisone 7.5 mg every other day.   Medication:   Current Outpatient Rx  Name  Route  Sig  Dispense  Refill  . albuterol (ACCUNEB) 0.63 MG/3ML nebulizer solution   Nebulization   Take 1 ampule by nebulization every 6 (six) hours as needed.           Marland Kitchen albuterol (PROVENTIL HFA;VENTOLIN HFA) 108 (90 BASE) MCG/ACT inhaler   Inhalation   Inhale 2 puffs into the lungs every 6 (six) hours as needed for wheezing or shortness of breath.         . B Complex-C-Folic Acid (STRESS B COMPLEX PO)   Oral   Take 1 tablet by mouth daily.         . Calcium Carbonate-Vitamin D (CALTRATE 600+D PO)   Oral  Take 1 tablet by mouth 2 (two) times daily.         Marland Kitchen co-enzyme Q-10 30 MG capsule   Oral   Take 200 mg by mouth daily.         . fexofenadine (ALLEGRA ALLERGY) 60 MG tablet   Oral   Take 60 mg by mouth daily.          . Fluticasone-Salmeterol (ADVAIR DISKUS) 250-50 MCG/DOSE AEPB   Inhalation   Inhale 1 puff into the lungs 2 (two) times daily.          . furosemide (LASIX) 20 MG tablet      TAKE 1 TABLET (20 MG TOTAL) BY MOUTH DAILY.   30 tablet   5   . GARLIC PO   Oral   Take 1 capsule by mouth daily.         Marland Kitchen gentamicin ointment (GARAMYCIN) 0.1 %   Topical   Apply 1 application topically 2 (two) times daily.         Marland Kitchen glucose blood (ONE TOUCH ULTRA TEST) test strip      Test sugars twice daily   50 each   12   . ibuprofen (ADVIL,MOTRIN) 800 MG tablet      TAKE 1 TABLET BY MOUTH EVERY 8 HOURS AS NEEDED FOR JOINT PAIN. TAKE WITH FOOD, STOP IT STOMACH UPSET   30 tablet   11   . levothyroxine (SYNTHROID, LEVOTHROID) 50 MCG tablet      TAKE 1 TABLET BY MOUTH DAILY   30 tablet   3   . montelukast (SINGULAIR) 10 MG tablet   Oral   Take 10 mg by mouth daily.           . Multiple Vitamins-Minerals  (CENTRUM ADULTS PO)   Oral   Take 1 tablet by mouth daily.         Marland Kitchen omeprazole (PRILOSEC) 40 MG capsule   Oral   Take 1 capsule (40 mg total) by mouth daily. Patient taking differently: Take 20 mg by mouth daily.    30 capsule   5   . ONETOUCH DELICA LANCETS 33G MISC      Test sugars twice daily   50 each   11   . predniSONE (DELTASONE) 5 MG tablet      Pt currently taking 10mg  qd Patient taking differently: 7.5 mg. Pt currently taking 10mg  qd   120 tablet   0     Take as directed by Dr. Dema Severin      Review of Systems: Gen:  Denies  fever, sweats, chills HEENT: Denies blurred vision, double vision, ear pain, eye pain, hearing loss, nose bleeds, sore throat Cvc:  No dizziness, chest pain or heaviness Resp:   Admits WU:[id number], yellow sputum production, DOE (stable).  Gi: Denies swallowing difficulty, stomach pain, nausea or vomiting, diarrhea, constipation, bowel incontinence Gu:  Denies bladder incontinence, burning urine Ext:   No Joint pain, stiffness or swelling Skin: No skin rash, easy bruising or bleeding or hives Endoc:  No polyuria, polydipsia , polyphagia or weight change Other:  All other systems negative  Allergies:  Erythromycin  Physical Examination:  VS: BP 124/78 mmHg  Pulse 70  Ht 5\' 7"  (1.702 m)  Wt 275 lb (124.739 kg)  BMI 43.06 kg/m2  SpO2 92%  General Appearance: No distress  HEENT: PERRLA, no ptosis, no other lesions noticed Pulmonary:normal breath sounds., diaphragmatic excursion normal.No wheezing, No rales   Cardiovascular:  Normal S1,S2.  No m/r/g.     Abdomen:Exam: Benign, Soft, non-tender, No masses  Skin:   warm, no rashes, no ecchymosis  Extremities: normal, no cyanosis, clubbing, warm with normal capillary refill.       Assessment and Plan: 69 year old female with chronic cough, recurrent URIs, being treated as ABPA/chronic eosinophilic pneumonia, unable to wean her steroids Asthma, chronic Recurrent exacerbation during the  first half of 2015 with elevated serum eosinophils and abnormal CT  Aspergillus IgE panel neg w/ low positive titers for Asp Nidulans, Flavus and Terreus and amstel.  She has an elevated total IgE level, CT findings with mild bronchiectasis, groundglass opacities-given her history of asthma and his current lab and radiographic abnormalities there is a high suspicion for ABPA. Currently being treated as ABPA, with steroid only.   Currently we are trying to taper her prednisone dose to the lowest possible effective dose that gives her the greatest clinical outcome. Patient got down to 7.5 mg every other day, but is still having moderate amount of symptoms of cough, runny nose, sputum production, and shortness of breath. At today's visit we have decided to move forward with a bunk [place] with BAL and possible transbronchial biopsies to evaluate for further causes of chronic cough recurrent eosinophilic pneumonia.  Plan - Continue Advair as directed - continue with  of Prednisone until follow-up visit - CT chest with contrast, followed by bronchoscopy with BAL and possible transbronchial biopsy - omeprazole , 1 pill at night with water only.            ABPA (allergic bronchopulmonary aspergillosis) Again this is a working diagnosis, other differentials include hypersensitivity pneumonitis, eosinophilic pneumonias, NSIP, BOOP. All of these diagnoses are treated with high-dose steroids for a prolonged period of time. She does have significant clinical improvement once steroids were initiated, with rapid decline when steroids are terminated. For further workup with Aspergillus skin testing, IgG levels, IgA levels were within normal limits.  The results of the skin testing and immunoglobulin testing were discussed in detail with the patient, they reviewed by me personally, and the patient was given a copy of her results. Patient will require prolonged course of steroids ,possibly 3-6  months, Initially started 07/2014 Pulmonary will taper the steroids.  Patient educated on side effects of steroids (weight gain, wereretention, increased appetite, muscle weakness, bone degradation, dysfunctional calcium levels, elevated blood glucose levels, anxiety/agitation at night, decreased immune).  Will continue to taper steroid doses to the lowest possible dose that will provide symptomatic relief, the premise is to completely taper steroids off. Given her negative Aspergillus skin testing (this was done while patient on high-dose steroids, which could also give a false negative read), at some point in the future if steroids can be tapered off or patient has relapse of symptoms. Patient educated on the current diagnosis, she understands that this is not an active infection, it is most likely a hyperreactive reaction to antigen exposure. She does not need/require antifungal treatment at this time. This is her initial treatment course, if she has refractory or recurrent symptoms will consider adding antifungals at that time.  Plan: -Patient has been on steroids chronically for the past one year, unable to wean off of steroids, at this time we discussed further evaluation and workup and need to rule out fungal and other atypical infections with a bronchoscopy with BAL. Patient is in agreement with this, also discussed transbronchial biopsy, which she is in agreement with. -We will plan for a CT chest with contrast  prior to bronchoscopy and then move forward with bronchoscopy     Updated Medication List Outpatient Encounter Prescriptions as of 08/11/2015  Medication Sig  . albuterol (ACCUNEB) 0.63 MG/3ML nebulizer solution Take 1 ampule by nebulization every 6 (six) hours as needed.    Marland Kitchen. albuterol (PROVENTIL HFA;VENTOLIN HFA) 108 (90 BASE) MCG/ACT inhaler Inhale 2 puffs into the lungs every 6 (six) hours as needed for wheezing or shortness of breath.  . B Complex-C-Folic Acid (STRESS B  COMPLEX PO) Take 1 tablet by mouth daily.  . Calcium Carbonate-Vitamin D (CALTRATE 600+D PO) Take 1 tablet by mouth 2 (two) times daily.  Marland Kitchen. co-enzyme Q-10 30 MG capsule Take 200 mg by mouth daily.  . fexofenadine (ALLEGRA ALLERGY) 60 MG tablet Take 60 mg by mouth daily.   . Fluticasone-Salmeterol (ADVAIR DISKUS) 250-50 MCG/DOSE AEPB Inhale 1 puff into the lungs 2 (two) times daily.   . furosemide (LASIX) 20 MG tablet TAKE 1 TABLET (20 MG TOTAL) BY MOUTH DAILY.  Marland Kitchen. GARLIC PO Take 1 capsule by mouth daily.  Marland Kitchen. gentamicin ointment (GARAMYCIN) 0.1 % Apply 1 application topically 2 (two) times daily.  Marland Kitchen. glucose blood (ONE TOUCH ULTRA TEST) test strip Test sugars twice daily  . ibuprofen (ADVIL,MOTRIN) 800 MG tablet TAKE 1 TABLET BY MOUTH EVERY 8 HOURS AS NEEDED FOR JOINT PAIN. TAKE WITH FOOD, STOP IT STOMACH UPSET  . levothyroxine (SYNTHROID, LEVOTHROID) 50 MCG tablet TAKE 1 TABLET BY MOUTH DAILY  . montelukast (SINGULAIR) 10 MG tablet Take 10 mg by mouth daily.    . Multiple Vitamins-Minerals (CENTRUM ADULTS PO) Take 1 tablet by mouth daily.  Marland Kitchen. omeprazole (PRILOSEC) 40 MG capsule Take 1 capsule (40 mg total) by mouth daily. (Patient taking differently: Take 20 mg by mouth daily. )  . ONETOUCH DELICA LANCETS 33G MISC Test sugars twice daily  . predniSONE (DELTASONE) 5 MG tablet Pt currently taking 10mg  qd (Patient taking differently: 7.5 mg. Pt currently taking 10mg  qd)   No facility-administered encounter medications on file as of 08/11/2015.    Orders for this visit: Orders Placed This Encounter  Procedures  . CT Chest W Contrast    Standing Status: Future     Number of Occurrences:      Standing Expiration Date: 10/10/2016    Order Specific Question:  If indicated for the ordered procedure, I authorize the administration of contrast media per Radiology protocol    Answer:  Yes    Order Specific Question:  Reason for Exam (SYMPTOM  OR DIAGNOSIS REQUIRED)    Answer:  recurrent cough    Order  Specific Question:  Preferred imaging location?    Answer:  [facility]    Thank  you for the visitation and for allowing  [facility] to assist in the care of your patient. Our recommendations are noted above.  Please contact us if we can be of further service.  Stephanie AcreVishal Moustafa Mossa, MD [facility] Office Number: [phone]

## 2015-08-12 DIAGNOSIS — J301 Allergic rhinitis due to pollen: Secondary | ICD-10-CM | POA: Diagnosis not present

## 2015-08-19 DIAGNOSIS — J301 Allergic rhinitis due to pollen: Secondary | ICD-10-CM | POA: Diagnosis not present

## 2015-08-23 ENCOUNTER — Ambulatory Visit
Admission: RE | Admit: 2015-08-23 | Discharge: 2015-08-23 | Disposition: A | Discharge status: Home / Self Care | Payer: Medicare Other | Source: Ambulatory Visit | Attending: Internal Medicine | Admitting: Internal Medicine

## 2015-08-23 DIAGNOSIS — R918 Other nonspecific abnormal finding of lung field: Secondary | ICD-10-CM | POA: Diagnosis not present

## 2015-08-23 DIAGNOSIS — R05 Cough: Secondary | ICD-10-CM

## 2015-08-23 DIAGNOSIS — R590 Localized enlarged lymph nodes: Secondary | ICD-10-CM | POA: Insufficient documentation

## 2015-08-23 DIAGNOSIS — R058 Other specified cough: Secondary | ICD-10-CM

## 2015-08-23 MED ORDER — IOHEXOL 300 MG/ML  SOLN
75.0000 mL | Freq: Once | INTRAMUSCULAR | Status: AC | PRN
  Administered 2015-08-23: 75 mL via INTRAVENOUS

## 2015-08-24 ENCOUNTER — Telehealth: Payer: Self-pay | Admitting: *Deleted

## 2015-08-24 NOTE — Telephone Encounter (Signed)
Pt returning call.Rachael Silva ° °

## 2015-08-24 NOTE — Telephone Encounter (Signed)
lmomtcb x1 

## 2015-08-24 NOTE — Telephone Encounter (Signed)
Pt informed of results. She stated she wanted to wait to do the bronch after Jan 1st that week. Informed pt that you weren't available. She gave the other option of week after Christmas. Informed pt you wouldn't be available during that time. She stated she would give me a call back to schedule the bronch. Nothing further needed. Will close telephone note and will wait for call back.

## 2015-08-24 NOTE — Telephone Encounter (Signed)
-----   Message from Stephanie AcreVishal Mungal, MD sent at 08/23/2015  7:35 PM EST ----- Regarding: CT results Please inform patient that her CT Chest looks the same as last year. No tumors, no masses, no abnormal findings.    Thank you.   Also,  Can we setup her bronchoscopy for after thanksgiving?

## 2015-08-24 NOTE — Telephone Encounter (Signed)
Spoke with pt and she wants bronch scheduled in Jan 2017. Will get with VM and check schedule. Once scheduled will call pt back and inform her.

## 2015-08-25 ENCOUNTER — Ambulatory Visit: Payer: Medicare Other | Attending: Pulmonary Disease

## 2015-08-25 DIAGNOSIS — J301 Allergic rhinitis due to pollen: Secondary | ICD-10-CM | POA: Diagnosis not present

## 2015-08-25 DIAGNOSIS — G4733 Obstructive sleep apnea (adult) (pediatric): Secondary | ICD-10-CM | POA: Diagnosis not present

## 2015-08-25 NOTE — Telephone Encounter (Signed)
Looked at your schedule for January 2017 and they only days your are in clinic are on Tuesday and Thursday. Do you want me to wait to schedule this pt for her bronch once we meet again with scheduling? Thanks

## 2015-08-25 NOTE — Telephone Encounter (Signed)
I would like to do her bronch before the end of the year. Lets try to get it done sometime after thanksgiving.   Thanks VM

## 2015-08-30 NOTE — Telephone Encounter (Signed)
Spoke with [name]. [name] is working personally with Dr. Dema SeverinMungal to schedule bronch. Once scheduled pt's chart will be updated.

## 2015-08-31 DIAGNOSIS — G4733 Obstructive sleep apnea (adult) (pediatric): Secondary | ICD-10-CM | POA: Diagnosis not present

## 2015-08-31 DIAGNOSIS — J301 Allergic rhinitis due to pollen: Secondary | ICD-10-CM | POA: Diagnosis not present

## 2015-08-31 NOTE — Telephone Encounter (Signed)
Can we schedule her bronch for 09/24/15 @ 1pm if she agrees?

## 2015-09-01 DIAGNOSIS — J301 Allergic rhinitis due to pollen: Secondary | ICD-10-CM | POA: Diagnosis not present

## 2015-09-01 NOTE — Telephone Encounter (Signed)
Yes that is okay with me. 

## 2015-09-05 ENCOUNTER — Other Ambulatory Visit: Payer: Self-pay | Admitting: Family Medicine

## 2015-09-06 ENCOUNTER — Other Ambulatory Visit: Payer: Self-pay | Admitting: Family Medicine

## 2015-09-06 NOTE — Telephone Encounter (Signed)
Please refill for a year  

## 2015-09-06 NOTE — Telephone Encounter (Signed)
LM on VM for pt to return call to see if she can do the bronch on 09/24/15 @1pm . Will await return call.

## 2015-09-06 NOTE — Telephone Encounter (Signed)
Pt's Endo doc use to prescribe this but I don't see any new appts with a new Endo doc since Dr. Welford RochePhadke left, please advise

## 2015-09-07 NOTE — Telephone Encounter (Signed)
LMOM for pt to call back to see is she can schedule her bronch for 09/10/15 @3pm . Will await call back.

## 2015-09-07 NOTE — Telephone Encounter (Signed)
Pt states she wants to keep the appt on Thursday to come in and discuss the bronch. Left pt on schedule. Nothing further needed at this time.

## 2015-09-07 NOTE — Telephone Encounter (Signed)
Spoke with pt and she states she can't fo the bronch 09/10/15. She is asking if you can do it 09-17-15. Informed I would need to discuss with you and will call her back.

## 2015-09-07 NOTE — Telephone Encounter (Signed)
I cannot do bronchoscopy on 09/17/15 or any other Friday in December.   -VM

## 2015-09-07 NOTE — Telephone Encounter (Signed)
lmtcb X2 for pt to see if she can do bronch at below stated time/date. Wcb.

## 2015-09-08 DIAGNOSIS — J301 Allergic rhinitis due to pollen: Secondary | ICD-10-CM | POA: Diagnosis not present

## 2015-09-08 MED ORDER — GLUCOSE BLOOD VI STRP: ORAL_STRIP | Status: DC

## 2015-09-08 MED ORDER — ONETOUCH DELICA LANCETS 33G MISC: Status: DC

## 2015-09-08 NOTE — Telephone Encounter (Signed)
[facility] needs diabetic supply request sent back with dx code. Done.

## 2015-09-08 NOTE — Addendum Note (Signed)
Addended by: Patience MuscaISLEY, Valon Glasscock M on: 09/08/2015 04:17 PM   Modules accepted: Orders

## 2015-09-09 ENCOUNTER — Ambulatory Visit (INDEPENDENT_AMBULATORY_CARE_PROVIDER_SITE_OTHER): Payer: Medicare Other | Admitting: Internal Medicine

## 2015-09-09 ENCOUNTER — Encounter: Payer: Self-pay | Admitting: Internal Medicine

## 2015-09-09 VITALS — BP 122/64 | HR 88 | Ht 67.0 in | Wt 281.0 lb

## 2015-09-09 DIAGNOSIS — B4481 Allergic bronchopulmonary aspergillosis: Secondary | ICD-10-CM

## 2015-09-09 NOTE — Assessment & Plan Note (Addendum)
Again this is a working diagnosis, other differentials include hypersensitivity pneumonitis, eosinophilic pneumonias, NSIP, BOOP. All of these diagnoses are treated with high-dose steroids for a prolonged period of time. She does have significant clinical improvement once steroids were initiated, with rapid decline when steroids are terminated. For further workup with Aspergillus skin testing, IgG levels, IgA levels were within normal limits.  The results of the skin testing and immunoglobulin testing were discussed in detail with the patient, they reviewed by me personally, and the patient was given a copy of her results. Patient has been on steroids for over a year, we have discussed bronchoscopy many times in the past to rule out fungal infections/atypical infections, evaluate for eosinophilic pneumonia, however patient was not amenable to this, until today.  Bronchoscopy with BAL and possibly TBBx indications, risk, benefits explained in great detail to the patient her daughter.  Given her high level anxiety/nervousness and propensity to cough, she has requested general anesthesia for this procedure, for which I am in agreement with.  Pulmonary will taper the steroids.  Patient educated on side effects of steroids (weight gain, wereretention, increased appetite, muscle weakness, bone degradation, dysfunctional calcium levels, elevated blood glucose levels, anxiety/agitation at night, decreased immune).  Will continue to taper steroid doses to the lowest possible dose that will provide symptomatic relief, the premise is to completely taper steroids off. Given her negative Aspergillus skin testing (this was done while patient on high-dose steroids, which could also give a false negative read), at some point in the future if steroids can be tapered off or patient has relapse of symptoms. Patient educated on the current diagnosis, she understands that this is not an active infection, it is most likely a  hyperreactive reaction to antigen exposure. She does not need/require antifungal treatment at this time. This is her initial treatment course, if she has refractory or recurrent symptoms will consider adding antifungals at that time.  Plan: -Patient has been on steroids chronically for the past one year, unable to wean off of steroids, at this time we discussed further evaluation and workup and need to rule out fungal and other atypical infections with a bronchoscopy with BAL. Patient is in agreement with this, also discussed transbronchial biopsy, which she is in agreement with. -Continue Prednisone - 5mg  daily, if symptoms increase, then can go back to 10mg  daily.

## 2015-09-09 NOTE — Progress Notes (Signed)
MRN# [id number] Rachael Silva Apr 17, 1946   CC: Chief Complaint  Patient presents with  . Follow-up    discuss bronch; no SOB or cough; no chest tightness/pain      Brief History:  06/18/14 IOV /Pulmonary consult   patient is a pleasant 69 yo female with PMHx of Asthma, nasal polyps, possible sinusitis, possible Eosinophilic Pneumonia, Arthritis, seen in consultation for chronic cough.   Patient stated that she started have a cough with yellowish sputum since April of 2015, the mucus production has dissipated a little, but the color can vary from grey to yellow at times.   Over the past year she has had recurrent bout of "bronchitis" treated with multiple rounds of antibiotics and steroids; her last round as 8/19, when she received a Zpak and prednisone for 10 days for recurrent cough with productive sputum production.   She states that her bronchitis episodes last about 1 week, then she get antibiotics and steroids, her symptoms (cough, sputum production, sob), improve; however, about 5 days after completing her antibiotics\steroids her symptoms start to reoccur.   She has seen and allergist (Dr. Clelia Croft) for recurrent allergies (dust) and is on OTC allegra.   She has also seen an ENT physician (Dr. Jenne Campus), for sinusitis symptoms, who stopped her nasal steroid possible due to her nasal polyps.   Patient states that she can walk about 1/2 block before getting short of breath, and cannot climb 1 flight of stairs. She is currently on lasix for leg swelling.   Patient has cats at home. She is a never smoker. 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   Patient has been using her albuterol on a daily basis since July.   07-02-14: presented to [facility] with worsening SOB\DOE, sputum production, noted to have elevated level of IgE, high suspicion for ABPA,  started on prednisone 0.5mg /kg/QD (60mg  daily). She had allergy profile testing performed, serum. Rapid improvement per patient since starting steroids.  At today's visit he states that she is rapidly improve, since starting prednisone which was given to her at her last visit to pulmonology [facility]. Patient is currently on 60 mg of prednisone daily, being treated for suspected ABPA (allergic bronchopulmonary aspergillosis). Should also like to review the results of her recent CT chest scan. Today she endorses mild dyspnea on exertion, mild cough which is nonproductive. IgE >2400, started on high dose tapering steroids at 60mg  and schedule for further aspergillus testing  07-15-14   Testing looks like true negative for IgE immediate allergic reaction to aspergillus. Recognize that 60 mg prednisone may mask reaction to unknown degree, although it did not limit reaction to histamine control. Another mold or another hypersensitivity pathway might still cause the syndrome in question.  08-11-14 Patient states that she is doing well today, no complaints of shortness of breath, dyspnea on exertion, productive cough, she does state she has a mild runny nose with clear discharge. Patient is currently on a prednisone taper, she started 40 mg daily on Friday, October 30. Patient states that since starting steroids she has gained approximately 10-12 pounds.  09-23-14 Patient states that she is doing better, no major complaints of sob, doe.  She does have mild productive cough since switching to prednisone 30mg  every other day, but she believes this is due to the weather causing her allergies to  act up. Cough with mild yellowish sputum production, in the mornings. She has lost 8 lbs since last visit Has bone density schedule for Jan 2016.   Plan:steroid taper is as follows: Starting September 28, 2014 prednisone 25 mg daily x2 weeks, then 25 mg every other day x2 weeks then 20 mg daily x2 weeks, then 20 mg  every other day x2 weeks, then  daily x 2weeks, then  every other day x 2wks  11-27-14   Patient states that she is doing better, no major complaints of sob, doe.  She does have mild productive cough since switching to prednisone  every other day, but again,she believes this is due to the weather changes causing her allergies to act up. Cough with mild yellowish sputum production, in the mornings, this has not changed since the last visit.   No urgent care\ED visits for cough\sob.   Gained 7lbs since the last visit. Currently on prednisone  everyother day, scheduled to switch to prednisone  daily on 11/23/14.   12-29-14 Patient stated that she reduced to prednisone 15 mg every other day, started back having symptoms of cough, sputum production, dyspnea on exertion and wheezing (after the 3rd dose). She increased her dose back to Prednisone  every other day, and started having resolution of symptoms after the first dose (within 1-2 days).   Currently, with baseline dyspnea, no cough\sputum production. She did state that she has not been using her Advair every day, due to thinking that prednisone was sufficient.   Patient stated that she saw her ENT this morning, had decrease breath sounds, received an albuterol treatment, did not feel much difference.   Plan: prednisone  everyother day for 3-4 weeks, then  everyother day until follow up.    ROV 02-02-15 Patient presents for a follow up visit day of ABPA steroid taper. No major symptoms   Down to  prednisone everyother day, no worsening of symptoms, accompained by daughter. Using Advair twice a day on a regular basis now.    Plan: Continue with   (3pills) everyother day until 02/18/15, then  (2pills) daily for 2 wks, then  everyother day until follow up. We had discussed bronchoscopy early in the diagnosis of ABPA, however, patient has refused bronchoscopy in the past and decided to forego with  steroid treatment and symptomatic management. If we're unable to wean patient from steroids or symptoms return with a steroid reduction dose will need to strongly consider bronchoscopy with BAL for further evaluation of atypical infections, fungal infection. This was discussed with the patient and her daughter at this visit, for both verbalized understanding.  ROV 04/06/15 Patient presents for a follow up visit day of ABPA steroid taper. Mild symptoms of a mild cough  and mild DOE with stir climbing at the end of the day.   Down to  prednisone everyother day, no worsening of symptoms. Using Advair twice a day on a regular basis now.     Patient states that she is currently off all oral diabetic meds now, she is only on DM monitoring Plan: Currently we are trying to taper her prednisone dose to the lowest possible effective dose that gives her the greatest clinical outcome. Continue with Prednisone  (2pills) everyother day until 04/11/15, then 7.5mg  (1.5pills) daily for 2 wks, then 7.5mg  everyother day until follow up   We had discussed bronchoscopy early in the diagnosis of ABPA, however, patient has refused bronchoscopy in the past and decided to forego with steroid treatment  and symptomatic management. If we're unable to wean patient from steroids or symptoms return with a steroid reduction dose will need to strongly consider bronchoscopy with BAL for further evaluation of atypical infections, fungal infection. This was discussed with the patient and her daughter at this visit, for both verbalized understanding.  ROV 05/11/15 - sleep study eval She presents for a sleep evaluation. She complains of snoring.  Symptoms began several months ago, stable since that time. She goes to sleep at 9 weekdays and 10 weekends. She awakens 7 weekdays and 8 weekends. She falls asleep in 10 minutes.  Collar size 18. She denies snoring.   She notes that she does not wake up refreshed, she is often sleepy during  the day. She often finds herself nodding off in the afternoon when doing activities such as using a computer or watching television. She has not had any close calls while at the wheel. She is not fallen asleep while driving. Plan: sleep study   ROV 06/07/15 Presents today for a follow up visit of suspected ABPA, was weaned down to 7.5mg  until last Wednesday, started having increased cough with yellow sputum production. She increased her prednisone to 10mg  daily, and her symptoms improved.  Overall with good clinical response to steroids, but difficult and prolonged wean. Saw sleep specialist for OSA eval, has not gotten PSG performed yet.   Plan: Patient wean to 7.5mg  daily, until last Wednesday, when the weather changed (more humid and hot), and her symptoms became worst, necessitating increase in Prednisone back to 10mg  daily.   Currently we are trying to taper her prednisone dose to the lowest possible effective dose that gives her the greatest clinical outcome. Continue with Prednisone 10mg  of Prednisone until 06/09/15, then starting 06/11/15 goto 10mg  prednisone everyother day for 1 month (entire September), then start prednisone 7.5mg  every other day on 07/10/15. We had discussed bronchoscopy early in the diagnosis of ABPA, however, patient has refused bronchoscopy in the past and decided to forego with steroid treatment and symptomatic management. If we're unable to wean patient from steroids or symptoms return with a steroid reduction dose will need to strongly consider bronchoscopy with BAL for further evaluation of atypical infections, fungal infection.   Events since last clinic visit: She presents today for a follow up visit of CEP/ABPA, also has questions about the bronchoscopy procedure. Currently on 10mg  prednisone daily, which is keep her symptoms well managed. Patient daughter is present during today's visit.  Admits to mild cough, nasal congestion and drainage  Medication:    Current Outpatient Rx  Name  Route  Sig  Dispense  Refill  . albuterol (ACCUNEB) 0.63 MG/3ML nebulizer solution   Nebulization   Take 1 ampule by nebulization every 6 (six) hours as needed.           Marland Kitchen. albuterol (PROVENTIL HFA;VENTOLIN HFA) 108 (90 BASE) MCG/ACT inhaler   Inhalation   Inhale 2 puffs into the lungs every 6 (six) hours as needed for wheezing or shortness of breath.         . B Complex-C-Folic Acid (STRESS B COMPLEX PO)   Oral   Take 1 tablet by mouth daily.         . Calcium Carbonate-Vitamin D (CALTRATE 600+D PO)   Oral   Take 1 tablet by mouth 2 (two) times daily.         Marland Kitchen. co-enzyme Q-10 30 MG capsule   Oral   Take 200 mg by mouth daily.         .Marland Kitchen  fexofenadine (ALLEGRA ALLERGY) 60 MG tablet   Oral   Take 60 mg by mouth daily.          . Fluticasone-Salmeterol (ADVAIR DISKUS) 250-50 MCG/DOSE AEPB   Inhalation   Inhale 1 puff into the lungs 2 (two) times daily.          . furosemide (LASIX) 20 MG tablet      TAKE 1 TABLET (20 MG TOTAL) BY MOUTH DAILY.   30 tablet   5   . GARLIC PO   Oral   Take 1 capsule by mouth daily.         Marland Kitchen glucose blood (ONE TOUCH ULTRA TEST) test strip      USE TO TEST TWICE DAILY Dx E09.9   50 each   11   . ibuprofen (ADVIL,MOTRIN) 800 MG tablet      TAKE 1 TABLET BY MOUTH EVERY 8 HOURS AS NEEDED FOR JOINT PAIN. TAKE WITH FOOD, STOP IT STOMACH UPSET   30 tablet   11   . levothyroxine (SYNTHROID, LEVOTHROID) 50 MCG tablet      TAKE 1 TABLET BY MOUTH DAILY   30 tablet   3   . montelukast (SINGULAIR) 10 MG tablet   Oral   Take 10 mg by mouth daily.           . Multiple Vitamins-Minerals (CENTRUM ADULTS PO)   Oral   Take 1 tablet by mouth daily.         Marland Kitchen omeprazole (PRILOSEC) 40 MG capsule   Oral   Take 1 capsule (40 mg total) by mouth daily. Patient taking differently: Take 20 mg by mouth daily.    30 capsule   5   . ONETOUCH DELICA LANCETS 33G MISC      TEST SUGARS TWICE DAILY Dx  E09.9   100 each   3   . predniSONE (DELTASONE) 10 MG tablet   Oral   Take 1 tablet (10 mg total) by mouth daily with breakfast.   30 tablet   0      Review of Systems: Gen:  Denies  fever, sweats, chills HEENT: Denies blurred vision, double vision, ear pain, eye pain, hearing loss, nose bleeds, sore throat Cvc:  No dizziness, chest pain or heaviness Resp:   Admits ON:GEXBM, yellow sputum production, DOE (stable).  Gi: Denies swallowing difficulty, stomach pain, nausea or vomiting, diarrhea, constipation, bowel incontinence Gu:  Denies bladder incontinence, burning urine Ext:   No Joint pain, stiffness or swelling Skin: No skin rash, easy bruising or bleeding or hives Endoc:  No polyuria, polydipsia , polyphagia or weight change Other:  All other systems negative  Allergies:  Erythromycin  Physical Examination:  VS: BP 122/64 mmHg  Pulse 88  Ht  (1.702 m)  Wt 281 lb (127.461 kg)  BMI 44.00 kg/m2  SpO2 97%  General Appearance: No distress  HEENT: PERRLA, no ptosis, no other lesions noticed Pulmonary:normal breath sounds., diaphragmatic excursion normal.No wheezing, No rales   Cardiovascular:  Normal S1,S2.  No m/r/g.     Abdomen:Exam: Benign, Soft, non-tender, No masses  Skin:   warm, no rashes, no ecchymosis  Extremities: normal, no cyanosis, clubbing, warm with normal capillary refill.    Radiology: (The following images and results were reviewed by Dr. Dema Severin on 09/09/2015).   CT Chest 08/23/15 COMPARISON: Chest CT, 06/23/2014  FINDINGS: Thoracic inlet: No mass or adenopathy. Thyroid is unremarkable.  Mediastinum and hila: Heart normal in size and configuration. There  is a focus of calcification within the left ventricle, unchanged. Minor coronary artery calcifications. Great vessels are normal in caliber. Several mildly prominent mediastinal lymph nodes. Largest is a right peritracheal node measuring 11 mm in short axis, previously 14 mm. Other  lymph nodes are either smaller or stable from the prior CT. No hilar masses or adenopathy.  Lungs and pleura: No evidence of pneumonia or edema. No mass or suspicious nodule.  Primarily linear and discoid type opacity is noted in the anteromedial right middle lobe, left upper lobe lingula and lower lobes, right greater than left, similar to the prior study, consistent with chronic atelectasis/ scarring.  No pleural effusion. No pneumothorax.  Limited upper abdomen: Fat containing Bochdalek's hernia on the right. Otherwise unremarkable.  Musculoskeletal: Degenerative changes noted along the thoracic spine. No osteoblastic or osteolytic lesions.  IMPRESSION: 1. No acute findings. 2. Areas of lung opacity noted in the left upper lobe lingula, right middle lobe and both lower lobes is similar to the prior study, most consistent with scarring and/or chronic atelectasis. No evidence of pneumonia or edema. No mass or suspicious nodule. 3. Mildly prominent mediastinal lymph nodes, which are either stable or decreased in size from the prior CT, consistent with reactive nodes.   Assessment and Plan: 69 year old female with chronic cough, recurrent URIs, being treated as ABPA/chronic eosinophilic pneumonia, unable to wean her steroids ABPA (allergic bronchopulmonary aspergillosis) Again this is a working diagnosis, other differentials include hypersensitivity pneumonitis, eosinophilic pneumonias, NSIP, BOOP. All of these diagnoses are treated with high-dose steroids for a prolonged period of time. She does have significant clinical improvement once steroids were initiated, with rapid decline when steroids are terminated. For further workup with Aspergillus skin testing, IgG levels, IgA levels were within normal limits.  The results of the skin testing and immunoglobulin testing were discussed in detail with the patient, they reviewed by me personally, and the patient was given a copy of  her results. Patient has been on steroids for over a year, we have discussed bronchoscopy many times in the past to rule out fungal infections/atypical infections, evaluate for eosinophilic pneumonia, however patient was not amenable to this, until today.  Bronchoscopy with BAL and possibly TBBx indications, risk, benefits explained in great detail to the patient her daughter.  Given her high level anxiety/nervousness and propensity to cough, she has requested general anesthesia for this procedure, for which I am in agreement with.  Pulmonary will taper the steroids.  Patient educated on side effects of steroids (weight gain, wereretention, increased appetite, muscle weakness, bone degradation, dysfunctional calcium levels, elevated blood glucose levels, anxiety/agitation at night, decreased immune).  Will continue to taper steroid doses to the lowest possible dose that will provide symptomatic relief, the premise is to completely taper steroids off. Given her negative Aspergillus skin testing (this was done while patient on high-dose steroids, which could also give a false negative read), at some point in the future if steroids can be tapered off or patient has relapse of symptoms. Patient educated on the current diagnosis, she understands that this is not an active infection, it is most likely a hyperreactive reaction to antigen exposure. She does not need/require antifungal treatment at this time. This is her initial treatment course, if she has refractory or recurrent symptoms will consider adding antifungals at that time.  Plan: -Patient has been on steroids chronically for the past one year, unable to wean off of steroids, at this time we discussed further evaluation and workup and  need to rule out fungal and other atypical infections with a bronchoscopy with BAL. Patient is in agreement with this, also discussed transbronchial biopsy, which she is in agreement with. -Continue Prednisone - 5mg   daily, if symptoms increase, then can go back to 10mg  daily.        Updated Medication List Outpatient Encounter Prescriptions as of 09/09/2015  Medication Sig  . albuterol (ACCUNEB) 0.63 MG/3ML nebulizer solution Take 1 ampule by nebulization every 6 (six) hours as needed.    Marland Kitchen albuterol (PROVENTIL HFA;VENTOLIN HFA) 108 (90 BASE) MCG/ACT inhaler Inhale 2 puffs into the lungs every 6 (six) hours as needed for wheezing or shortness of breath.  . B Complex-C-Folic Acid (STRESS B COMPLEX PO) Take 1 tablet by mouth daily.  . Calcium Carbonate-Vitamin D (CALTRATE 600+D PO) Take 1 tablet by mouth 2 (two) times daily.  Marland Kitchen co-enzyme Q-10 30 MG capsule Take 200 mg by mouth daily.  . fexofenadine (ALLEGRA ALLERGY) 60 MG tablet Take 60 mg by mouth daily.   . Fluticasone-Salmeterol (ADVAIR DISKUS) 250-50 MCG/DOSE AEPB Inhale 1 puff into the lungs 2 (two) times daily.   . furosemide (LASIX) 20 MG tablet TAKE 1 TABLET (20 MG TOTAL) BY MOUTH DAILY.  Marland Kitchen GARLIC PO Take 1 capsule by mouth daily.  Marland Kitchen glucose blood (ONE TOUCH ULTRA TEST) test strip USE TO TEST TWICE DAILY Dx E09.9  . ibuprofen (ADVIL,MOTRIN) 800 MG tablet TAKE 1 TABLET BY MOUTH EVERY 8 HOURS AS NEEDED FOR JOINT PAIN. TAKE WITH FOOD, STOP IT STOMACH UPSET  . levothyroxine (SYNTHROID, LEVOTHROID) 50 MCG tablet TAKE 1 TABLET BY MOUTH DAILY  . montelukast (SINGULAIR) 10 MG tablet Take 10 mg by mouth daily.    . Multiple Vitamins-Minerals (CENTRUM ADULTS PO) Take 1 tablet by mouth daily.  Marland Kitchen omeprazole (PRILOSEC) 40 MG capsule Take 1 capsule (40 mg total) by mouth daily. (Patient taking differently: Take 20 mg by mouth daily. )  . ONETOUCH DELICA LANCETS 33G MISC TEST SUGARS TWICE DAILY Dx E09.9  . predniSONE (DELTASONE) 10 MG tablet Take 1 tablet (10 mg total) by mouth daily with breakfast.  . [DISCONTINUED] furosemide (LASIX) 20 MG tablet TAKE 1 TABLET (20 MG TOTAL) BY MOUTH DAILY.  . [DISCONTINUED] gentamicin ointment (GARAMYCIN) 0.1 % Apply 1  application topically 2 (two) times daily.   No facility-administered encounter medications on file as of 09/09/2015.    Orders for this visit: No orders of the defined types were placed in this encounter.    Thank  you for the visitation and for allowing  [facility] to assist in the care of your patient. Our recommendations are noted above.  Please contact us if we can be of further service.  Stephanie Acre, MD [facility] Office Number: [phone]

## 2015-09-09 NOTE — Patient Instructions (Signed)
We will call you with a follow up appointment - wean down prednisone to 5mg  daily, if symptoms return within 3-5 days of reducing Prednisone, then go back to 10 mg daily - we will schedule a bronchoscopy with BAL, biopsy.

## 2015-09-10 ENCOUNTER — Encounter
Admission: RE | Admit: 2015-09-10 | Discharge: 2015-09-10 | Disposition: A | Discharge status: Home / Self Care | Payer: Medicare Other | Source: Ambulatory Visit | Attending: Internal Medicine | Admitting: Internal Medicine

## 2015-09-10 DIAGNOSIS — Z01810 Encounter for preprocedural cardiovascular examination: Secondary | ICD-10-CM | POA: Diagnosis not present

## 2015-09-10 DIAGNOSIS — Z01812 Encounter for preprocedural laboratory examination: Secondary | ICD-10-CM | POA: Insufficient documentation

## 2015-09-10 DIAGNOSIS — I10 Essential (primary) hypertension: Secondary | ICD-10-CM | POA: Diagnosis not present

## 2015-09-10 HISTORY — DX: Reserved for inherently not codable concepts without codable children: IMO0001

## 2015-09-10 HISTORY — DX: Localized edema: R60.0

## 2015-09-10 HISTORY — DX: Other complications of anesthesia, initial encounter: T88.59XA

## 2015-09-10 HISTORY — DX: Sleep apnea, unspecified: G47.30

## 2015-09-10 HISTORY — DX: Pneumonia, unspecified organism: J18.9

## 2015-09-10 HISTORY — DX: Wheezing: R06.2

## 2015-09-10 HISTORY — DX: Essential (primary) hypertension: I10

## 2015-09-10 HISTORY — DX: Adverse effect of unspecified anesthetic, initial encounter: T41.45XA

## 2015-09-10 LAB — BASIC METABOLIC PANEL
ANION GAP: 6 (ref 5–15)
BUN: 24 mg/dL — ABNORMAL HIGH (ref 6–20)
CALCIUM: 9.1 mg/dL (ref 8.9–10.3)
CHLORIDE: 108 mmol/L (ref 101–111)
CO2: 30 mmol/L (ref 22–32)
CREATININE: 1.14 mg/dL — AB (ref 0.44–1.00)
GFR calc non Af Amer: 49 mL/min — ABNORMAL LOW (ref >60)
GFR, EST AFRICAN AMERICAN: 56 mL/min — AB (ref >60)
Glucose, Bld: 79 mg/dL (ref 65–99)
Potassium: 4.1 mmol/L (ref 3.5–5.1)
SODIUM: 144 mmol/L (ref 135–145)

## 2015-09-10 NOTE — Patient Instructions (Signed)
  Your procedure is scheduled on: 09/16/15 Thurs Report to Day Surgery.2nd floor medical mall To find out your arrival time please call [phone] between 1PM - 3PM on 09/15/15 Wed  Remember: Instructions that are not followed completely may result in serious medical risk, up to and including death, or upon the discretion of your surgeon and anesthesiologist your surgery may need to be rescheduled.    __x__ 1. Do not eat food or drink liquids after midnight. No gum chewing or hard candies.     ____ 2. No Alcohol for 24 hours before or after surgery.   ____ 3. Bring all medications with you on the day of surgery if instructed.    __x__ 4. Notify your doctor if there is any change in your medical condition     (cold, fever, infections).     Do not wear jewelry, make-up, hairpins, clips or nail polish.  Do not wear lotions, powders, or perfumes. You may wear deodorant.  Do not shave 48 hours prior to surgery. Men may shave face and neck.  Do not bring valuables to the hospital.    [facility] is not responsible for any belongings or valuables.               Contacts, dentures or bridgework may not be worn into surgery.  Leave your suitcase in the car. After surgery it may be brought to your room.  For patients admitted to the hospital, discharge time is determined by your                treatment team.   Patients discharged the day of surgery will not be allowed to drive home.   Please read over the following fact sheets that you were given:      _x___ Take these medicines the morning of surgery with A SIP OF WATER:    1. albuterol (PROVENTIL HFA;VENTOLIN HFA) 108 (90 BASE) MCG/ACT inhaler  2. Fluticasone-Salmeterol (ADVAIR DISKUS) 250-50 MCG/DOSE AEPB  3. levothyroxine (SYNTHROID, LEVOTHROID) 50 MCG tablet  4.omeprazole (PRILOSEC) 40 MG capsule  5.predniSONE (DELTASONE) 10 MG tablet  6.  ____ Fleet Enema (as directed)   ____ Use CHG Soap as directed  ____ Use inhalers on  the day of surgery  ____ Stop metformin 2 days prior to surgery    ____ Take 1/2 of usual insulin dose the night before surgery and none on the morning of surgery.   ____ Stop Coumadin/Plavix/aspirin on   __x__ Stop Anti-inflammatories on stop ibuprofen now may take Tylenol as needed   ____ Stop supplements until after surgery.    ____ Bring C-Pap to the hospital.

## 2015-09-13 ENCOUNTER — Telehealth: Payer: Self-pay | Admitting: Internal Medicine

## 2015-09-13 MED ORDER — LEVOFLOXACIN 500 MG PO TABS: 500.0000 mg | ORAL_TABLET | Freq: Every day | ORAL | Status: DC

## 2015-09-13 NOTE — Telephone Encounter (Signed)
Pt informed of Levaquin and Informed to keep scheduled bronch. Nothing further needed.

## 2015-09-13 NOTE — Telephone Encounter (Signed)
Spoke with pt, c/o prod cough with yellow mucus X2 days.  Unsure if she has any sinus congestion.  Denies fever, nausea, vomiting.  Pt is concerned because she is scheduled for a bronch on Thursday, and is unsure if she needs to reschedule this or if she needs to take anything prior to the procedure.    Pt uses [facility] in [place].   Dr. Dema SeverinMungal, please advise on recs.  Thanks!

## 2015-09-13 NOTE — Telephone Encounter (Signed)
Patient is currently on 5mg  of prednisone daily. She was advised if her symptoms return to increase to 10mg  daily (this was her previous dose).  Thank you Dr. Dema SeverinMungal

## 2015-09-13 NOTE — Telephone Encounter (Signed)
Spoke with [name] in [place], states that VM is off today as he was on call all weekend.  Sending message to dr Belia HemanKasa as he is in [place].    Dr. Belia HemanKasa please advise.  Thanks!

## 2015-09-13 NOTE — Telephone Encounter (Signed)
Please give levoquin 500 mg daily for 7 days

## 2015-09-15 ENCOUNTER — Other Ambulatory Visit: Payer: Self-pay | Admitting: Family Medicine

## 2015-09-16 ENCOUNTER — Ambulatory Visit
Admission: RE | Admit: 2015-09-16 | Discharge: 2015-09-16 | Disposition: A | Discharge status: Home / Self Care | Payer: Medicare Other | Source: Ambulatory Visit | Attending: Internal Medicine | Admitting: Internal Medicine

## 2015-09-16 ENCOUNTER — Ambulatory Visit: Payer: Medicare Other | Admitting: Anesthesiology

## 2015-09-16 ENCOUNTER — Encounter: Payer: Self-pay | Admitting: *Deleted

## 2015-09-16 ENCOUNTER — Encounter
Admission: RE | Disposition: A | Discharge status: Home / Self Care | Payer: Self-pay | Source: Ambulatory Visit | Attending: Internal Medicine

## 2015-09-16 DIAGNOSIS — R05 Cough: Secondary | ICD-10-CM | POA: Insufficient documentation

## 2015-09-16 DIAGNOSIS — Z6841 Body Mass Index (BMI) 40.0 and over, adult: Secondary | ICD-10-CM | POA: Diagnosis not present

## 2015-09-16 DIAGNOSIS — B4481 Allergic bronchopulmonary aspergillosis: Secondary | ICD-10-CM

## 2015-09-16 DIAGNOSIS — J329 Chronic sinusitis, unspecified: Secondary | ICD-10-CM | POA: Diagnosis not present

## 2015-09-16 DIAGNOSIS — J8281 Chronic eosinophilic pneumonia: Secondary | ICD-10-CM | POA: Insufficient documentation

## 2015-09-16 DIAGNOSIS — E039 Hypothyroidism, unspecified: Secondary | ICD-10-CM | POA: Diagnosis not present

## 2015-09-16 DIAGNOSIS — R053 Chronic cough: Secondary | ICD-10-CM | POA: Insufficient documentation

## 2015-09-16 DIAGNOSIS — R51 Headache: Secondary | ICD-10-CM | POA: Diagnosis not present

## 2015-09-16 DIAGNOSIS — E7800 Pure hypercholesterolemia, unspecified: Secondary | ICD-10-CM | POA: Diagnosis not present

## 2015-09-16 DIAGNOSIS — R918 Other nonspecific abnormal finding of lung field: Secondary | ICD-10-CM | POA: Diagnosis not present

## 2015-09-16 DIAGNOSIS — R609 Edema, unspecified: Secondary | ICD-10-CM | POA: Diagnosis not present

## 2015-09-16 DIAGNOSIS — J309 Allergic rhinitis, unspecified: Secondary | ICD-10-CM | POA: Diagnosis not present

## 2015-09-16 DIAGNOSIS — E669 Obesity, unspecified: Secondary | ICD-10-CM | POA: Diagnosis not present

## 2015-09-16 DIAGNOSIS — I10 Essential (primary) hypertension: Secondary | ICD-10-CM | POA: Diagnosis not present

## 2015-09-16 DIAGNOSIS — J45909 Unspecified asthma, uncomplicated: Secondary | ICD-10-CM | POA: Insufficient documentation

## 2015-09-16 DIAGNOSIS — K449 Diaphragmatic hernia without obstruction or gangrene: Secondary | ICD-10-CM | POA: Insufficient documentation

## 2015-09-16 DIAGNOSIS — R635 Abnormal weight gain: Secondary | ICD-10-CM | POA: Diagnosis not present

## 2015-09-16 DIAGNOSIS — J82 Pulmonary eosinophilia, not elsewhere classified: Secondary | ICD-10-CM | POA: Diagnosis not present

## 2015-09-16 HISTORY — PX: FLEXIBLE BRONCHOSCOPY: SHX5094

## 2015-09-16 LAB — CBC WITH DIFFERENTIAL/PLATELET
Basophils Absolute: 0.1 10*3/uL (ref 0–0.1)
Basophils Relative: 1 %
EOS ABS: 0.6 10*3/uL (ref 0–0.7)
Eosinophils Relative: 11 %
HCT: 44.2 % (ref 35.0–47.0)
HEMOGLOBIN: 14.5 g/dL (ref 12.0–16.0)
LYMPHS ABS: 1.9 10*3/uL (ref 1.0–3.6)
Lymphocytes Relative: 34 %
MCH: 29.3 pg (ref 26.0–34.0)
MCHC: 32.7 g/dL (ref 32.0–36.0)
MCV: 89.8 fL (ref 80.0–100.0)
MONOS PCT: 7 %
Monocytes Absolute: 0.4 10*3/uL (ref 0.2–0.9)
NEUTROS PCT: 47 %
Neutro Abs: 2.7 10*3/uL (ref 1.4–6.5)
Platelets: 193 10*3/uL (ref 150–440)
RBC: 4.93 MIL/uL (ref 3.80–5.20)
RDW: 13.5 % (ref 11.5–14.5)
WBC: 5.6 10*3/uL (ref 3.6–11.0)

## 2015-09-16 LAB — GLUCOSE, CAPILLARY: Glucose-Capillary: 99 mg/dL (ref 65–99)

## 2015-09-16 SURGERY — BRONCHOSCOPY, FLEXIBLE
Anesthesia: General

## 2015-09-16 MED ORDER — LIDOCAINE HCL (CARDIAC) 20 MG/ML IV SOLN
INTRAVENOUS | Status: DC | PRN
  Administered 2015-09-16: 40 mg via INTRAVENOUS
  Administered 2015-09-16: 100 mg via INTRAVENOUS

## 2015-09-16 MED ORDER — ROCURONIUM BROMIDE 100 MG/10ML IV SOLN
INTRAVENOUS | Status: DC | PRN
  Administered 2015-09-16: 5 mg via INTRAVENOUS

## 2015-09-16 MED ORDER — IPRATROPIUM-ALBUTEROL 0.5-2.5 (3) MG/3ML IN SOLN
RESPIRATORY_TRACT | Status: AC
  Filled 2015-09-16: qty 3

## 2015-09-16 MED ORDER — SUCCINYLCHOLINE CHLORIDE 20 MG/ML IJ SOLN
INTRAMUSCULAR | Status: DC | PRN
  Administered 2015-09-16: 100 mg via INTRAVENOUS

## 2015-09-16 MED ORDER — PROPOFOL 10 MG/ML IV BOLUS
INTRAVENOUS | Status: DC | PRN
  Administered 2015-09-16: 170 mg via INTRAVENOUS
  Administered 2015-09-16: 30 mg via INTRAVENOUS

## 2015-09-16 MED ORDER — IPRATROPIUM-ALBUTEROL 0.5-2.5 (3) MG/3ML IN SOLN
3.0000 mL | Freq: Three times a day (TID) | RESPIRATORY_TRACT | Status: DC | PRN
  Administered 2015-09-16 (×2): 3 mL via RESPIRATORY_TRACT

## 2015-09-16 MED ORDER — ESMOLOL HCL 100 MG/10ML IV SOLN
INTRAVENOUS | Status: DC | PRN
  Administered 2015-09-16 (×2): 20 mg via INTRAVENOUS

## 2015-09-16 MED ORDER — MIDAZOLAM HCL 2 MG/2ML IJ SOLN
INTRAMUSCULAR | Status: DC | PRN
  Administered 2015-09-16: 2 mg via INTRAVENOUS

## 2015-09-16 MED ORDER — FENTANYL CITRATE (PF) 100 MCG/2ML IJ SOLN
INTRAMUSCULAR | Status: DC | PRN
  Administered 2015-09-16: 50 ug via INTRAVENOUS

## 2015-09-16 MED ORDER — SODIUM CHLORIDE 0.9 % IV SOLN
INTRAVENOUS | Status: DC
  Administered 2015-09-16: 13:00:00 via INTRAVENOUS

## 2015-09-16 MED ORDER — PROPOFOL 500 MG/50ML IV EMUL
INTRAVENOUS | Status: DC | PRN
  Administered 2015-09-16: 25 ug/kg/min via INTRAVENOUS

## 2015-09-16 MED ORDER — IPRATROPIUM-ALBUTEROL 0.5-2.5 (3) MG/3ML IN SOLN
RESPIRATORY_TRACT | Status: AC
  Administered 2015-09-16: 3 mL via RESPIRATORY_TRACT
  Filled 2015-09-16: qty 3

## 2015-09-16 MED ORDER — EPHEDRINE SULFATE 50 MG/ML IJ SOLN
INTRAMUSCULAR | Status: DC | PRN
Start: 2015-09-16 — End: 2015-09-16
  Administered 2015-09-16: 10 mg via INTRAVENOUS

## 2015-09-16 NOTE — Discharge Instructions (Signed)
AMBULATORY SURGERY  °DISCHARGE INSTRUCTIONS ° ° °1) The drugs that you were given will stay in your system until tomorrow so for the next 24 hours you should not: ° °A) Drive an automobile °B) Make any legal decisions °C) Drink any alcoholic beverage ° ° °2) You may resume regular meals tomorrow.  Today it is better to start with liquids and gradually work up to solid foods. ° °You may eat anything you prefer, but it is better to start with liquids, then soup and crackers, and gradually work up to solid foods. ° ° °3) Please notify your doctor immediately if you have any unusual bleeding, trouble breathing, redness and pain at the surgery site, drainage, fever, or pain not relieved by medication. ° ° ° °4) Additional Instructions: ° ° ° ° ° ° ° °Please contact your physician with any problems or Same Day Surgery at [phone], Monday through Friday 6 am to 4 pm, or [place] [facility] [place] Main number at [phone].AMBULATORY SURGERY  °DISCHARGE INSTRUCTIONS ° ° °5) The drugs that you were given will stay in your system until tomorrow so for the next 24 hours you should not: ° °D) Drive an automobile °E) Make any legal decisions °F) Drink any alcoholic beverage ° ° °6) You may resume regular meals tomorrow.  Today it is better to start with liquids and gradually work up to solid foods. ° °You may eat anything you prefer, but it is better to start with liquids, then soup and crackers, and gradually work up to solid foods. ° ° °7) Please notify your doctor immediately if you have any unusual bleeding, trouble breathing, redness and pain at the surgery site, drainage, fever, or pain not relieved by medication. ° ° ° °8) Additional Instructions: ° ° ° ° ° ° ° °Please contact your physician with any problems or Same Day Surgery at [phone], Monday through Friday 6 am to 4 pm, or  at  Main number at [phone]. °

## 2015-09-16 NOTE — Anesthesia Preprocedure Evaluation (Signed)
Anesthesia Evaluation  Patient identified by MRN, date of birth, ID band Patient awake    Reviewed: Allergy & Precautions, H&P , NPO status , Patient's Chart, lab work & pertinent test results, reviewed documented beta blocker date and time   History of Anesthesia Complications (+) history of anesthetic complications  Airway Mallampati: III  TM Distance: >3 FB Neck ROM: full    Dental no notable dental hx. (+) Teeth Intact   Pulmonary neg pulmonary ROS, shortness of breath, asthma , sleep apnea , pneumonia, Current Smoker,    Pulmonary exam normal breath sounds clear to auscultation       Cardiovascular Exercise Tolerance: Good hypertension, negative cardio ROS Normal cardiovascular exam Rhythm:regular Rate:Normal     Neuro/Psych  Headaches,  Neuromuscular disease negative neurological ROS  negative psych ROS   GI/Hepatic negative GI ROS, Neg liver ROS, hiatal hernia,   Endo/Other  negative endocrine ROSdiabetesHypothyroidism   Renal/GU negative Renal ROS  negative genitourinary   Musculoskeletal   Abdominal   Peds  Hematology negative hematology ROS (+)   Anesthesia Other Findings   Reproductive/Obstetrics negative OB ROS                             Anesthesia Physical Anesthesia Plan  ASA: IV  Anesthesia Plan: General and General ETT   Post-op Pain Management:    Induction:   Airway Management Planned:   Additional Equipment:   Intra-op Plan:   Post-operative Plan:   Informed Consent: I have reviewed the patients History and Physical, chart, labs and discussed the procedure including the risks, benefits and alternatives for the proposed anesthesia with the patient or authorized representative who has indicated his/her understanding and acceptance.   Dental Advisory Given  Plan Discussed with: CRNA  Anesthesia Plan Comments:         Anesthesia Quick  Evaluation

## 2015-09-16 NOTE — Transfer of Care (Signed)
Immediate Anesthesia Transfer of Care Note  Patient: Rachael Silva  Procedure(s) Performed: Procedure(s): FLEXIBLE BRONCHOSCOPY WITH FLURO  (N/A)  Patient Location: PACU  Anesthesia Type:General  Level of Consciousness: awake  Airway & Oxygen Therapy: Patient Spontanous Breathing and Patient connected to face mask oxygen  Post-op Assessment: Report given to RN and Post -op Vital signs reviewed and stable  Post vital signs: Reviewed and stable  Last Vitals:  Filed Vitals:   09/16/15 1219 09/16/15 1409  BP: 150/81 127/85  Pulse: 87 102  Temp: 37.1 C 37.3 C  Resp: 16 23    Complications: No apparent anesthesia complications

## 2015-09-16 NOTE — Anesthesia Procedure Notes (Signed)
Procedure Name: Intubation Date/Time: 09/16/2015 1:15 PM Performed by: Henrietta HooverPOPE, Johnesha Acheampong Pre-anesthesia Checklist: Patient identified, Emergency Drugs available, Suction available, Patient being monitored and Timeout performed Patient Re-evaluated:Patient Re-evaluated prior to inductionOxygen Delivery Method: Circle system utilized Preoxygenation: Pre-oxygenation with 100% oxygen Intubation Type: IV induction Laryngoscope Size: Mac and 3 Grade View: Grade I Tube type: Oral Tube size: 8.0 mm Number of attempts: 1 Airway Equipment and Method: Stylet Placement Confirmation: ETT inserted through vocal cords under direct vision,  positive ETCO2 and breath sounds checked- equal and bilateral Secured at: 22 cm Tube secured with: Tape Dental Injury: Teeth and Oropharynx as per pre-operative assessment

## 2015-09-16 NOTE — Op Note (Signed)
[facility] Patient Name: Rachael Silva Procedure Date: 09/16/2015 1:15 PM MRN: [id number] Account #: [id number] Date of Birth: 1946-04-21 Admit Type: Outpatient Age: 69 Room: [facility] OR ROOM 06 Gender: Female Note Status: Finalized Attending MD: Stephanie Acre,  Procedure:         Bronchoscopy Indications:       Chronic cough with abnormal CT Providers:         Stephanie Acre Referring MD:       Medicines:         General Anesthesia Complications:     No immediate complications. Estimated blood loss: Minimal Procedure:         Pre-Anesthesia Assessment:                    - A History and Physical has been performed. Patient meds                     and allergies have been reviewed. The risks and benefits                     of the procedure and the sedation options and risks were                     discussed with the patient. All questions were answered                     and informed consent was obtained. Patient identification                     and proposed procedure were verified prior to the                     procedure. Mental Status Examination: normal. After                     reviewing the risks and benefits, the patient was deemed                     in satisfactory condition to undergo the procedure. The                     anesthesia plan was to use general anesthesia. Immediately                     prior to administration of medications, the patient was                     re-assessed for adequacy to receive sedatives. The heart                     rate, respiratory rate, oxygen saturations, blood                     pressure, adequacy of pulmonary ventilation, and response                     to care were monitored throughout the procedure. The                     physical status of the patient was re-assessed after the                     procedure.  After obtaining informed consent, the bronchoscope was   passed under direct vision. Throughout the procedure, the                     patient's blood pressure, pulse, and oxygen saturations                     were monitored continuously. the Bronchoscope Olympus                     [id number] was introduced through the nose, via                     the endotracheal tube (the patient was intubated for the                     procedure) and advanced to the trachea. The procedure was                     accomplished without difficulty. The total duration of the                     procedure was 0 hours and 15 minutes. Findings:      Right Lung Abnormalities: Scant secretions were found in the right       mainstem bronchus, in the bronchus intermedius, in the right middle lobe       and in the right lower lobe. They were not obstructing the airway.      Left Lung Abnormalities: Scant and clear secretions were found in the       left mainstem bronchus, in the left upper lobe, in the left lower lobe,       in the apical-posterior segment of the left upper lobe (B1 & B2), in the       anterior segment of the left upper lobe (B3), in the superior lingula       segment of the left upper lobe (B4), in the inferior lingula segment of       the left upper lobe (B5), in the superior segment of the left lower lobe       (B6), in the anterior medial segment of the left lower lobe (B7 & B8),       in the lateral basal segment of the left lower lobe (B9) and in the       posterior basal segment of the left lower lobe (B10). They were not       obstructing the airway.      Bronchoalveolar lavage was performed in the right middle lobe of the       lung and sent for cell count, bacterial culture, viral smears & culture,       and fungal & AFB analysis and cytology. 50 mL of fluid were instilled.       30 mL were returned. The return was cloudy. There were no mucoid plugs       in the return fluid.      Transbronchial biopsies were performed in the  anterior basal segment of       the right lower lobe using forceps and sent for routine cytology. Three       biopsy passes were performed. Three biopsy samples were obtained. Impression:        - Scant secretions were found in the right mainstem  bronchus, in the bronchus intermedius, in the right middle                     lobe and in the right lower lobe.                    - Scant and clear secretions were found in the left                     mainstem bronchus, in the left upper lobe, in the left                     lower lobe, in the apical-posterior segment of the left                     upper lobe (B1 & B2), in the anterior segment of the left                     upper lobe (B3), in the superior lingula segment of the                     left upper lobe, in the inferior lingula segment of the                     left upper lobe (B5), in the superior segment of the left                     lower lobe (B6), in the anterior medial segment of the                     left lower lobe (B7 & B8), in the lateral basal segment of                     the left lower lobe (B9) and in the posterior basal                     segment of the left lower lobe (B10).                    - Bronchoalveolar lavage was performed.                    - TBBx in the RLL x 3 Recommendation:    - Await BAL and biopsy results.                    - Follow up in clinic as previously scheduled. Onita Pfluger,  09/16/2015 2:01:28 PM Number of Addenda: 0 Note Initiated On: 09/16/2015 1:15 PM      [facility]

## 2015-09-16 NOTE — H&P (Signed)
H&P as dictated by clinic note on 09/13/15. Patient with cough and congestion - chronic issue for her, in a mild exacerbation over the last 2 days. Present today for bronchoscopic evaluation of the airways given her chronic cough and persistent upper respiratory tract infections.  Dx - Chronic Cough, ABPA, CEP Planned procedure - FOB, BAL, TBBx   Stephanie AcreVishal Reakwon Barren, MD [facility] Pager [phone]- [phone] (please enter 7-digits) On Call Pager - [phone] (please enter 7-digits)

## 2015-09-17 LAB — SURGICAL PATHOLOGY

## 2015-09-17 LAB — CYTOLOGY - NON PAP

## 2015-09-19 LAB — CULTURE, BAL-QUANTITATIVE W GRAM STAIN: Special Requests: NORMAL

## 2015-09-19 LAB — CULTURE, BAL-QUANTITATIVE: CULTURE: NORMAL

## 2015-09-20 NOTE — Anesthesia Postprocedure Evaluation (Signed)
Anesthesia Post Note  Patient: Rachael Silva  Procedure(s) Performed: Procedure(s) (LRB): FLEXIBLE BRONCHOSCOPY WITH FLURO  (N/A)  Patient location during evaluation: PACU Anesthesia Type: General Level of consciousness: awake and alert Pain management: pain level controlled Vital Signs Assessment: post-procedure vital signs reviewed and stable Respiratory status: spontaneous breathing, nonlabored ventilation, respiratory function stable and patient connected to nasal cannula oxygen Cardiovascular status: blood pressure returned to baseline and stable Anesthetic complications: no    Last Vitals:  Filed Vitals:   09/16/15 1457 09/16/15 1545  BP:  144/58  Pulse: 86 90  Temp: 36.3 C 36.3 C  Resp: 20 20    Last Pain:  Filed Vitals:   09/16/15 1546  PainSc: 2                  Yevette EdwardsJames G Nia Nathaniel

## 2015-09-22 DIAGNOSIS — J301 Allergic rhinitis due to pollen: Secondary | ICD-10-CM | POA: Diagnosis not present

## 2015-09-23 ENCOUNTER — Telehealth: Payer: Self-pay | Admitting: *Deleted

## 2015-09-23 NOTE — Telephone Encounter (Signed)
-----   Message from Stephanie AcreVishal Mungal, MD sent at 09/21/2015 11:41 PM EST ----- Regarding: bronch results Please inform Rachael Silva that currently all cultures from her Bronchoscopy (fungal and bacteria) are negative to date. The small biopsies that I took are consistent with Chronic Eosinophilic Pneumonia. Continue 10mg  prednisone daily until 10/10/2015. Starting 10/11/2015, prednisone 7.5mg  daily, until follow up with me.  Again, if symptoms return on lower dose of prednisone, then go back to 10mg  daily until follow up.  Thank you Dr. Dema SeverinMungal

## 2015-09-23 NOTE — Telephone Encounter (Signed)
Pt informed of information. She asked that I let you know that she is very pleased with the bronch that she didn't want to have done. She also states that she has recommended you to another person that is looking for a pulmonologist. She says to tell you thank you so much. Nothing further needed.

## 2015-09-30 DIAGNOSIS — J301 Allergic rhinitis due to pollen: Secondary | ICD-10-CM | POA: Diagnosis not present

## 2015-10-05 ENCOUNTER — Encounter: Payer: Self-pay | Admitting: Internal Medicine

## 2015-10-06 ENCOUNTER — Ambulatory Visit (INDEPENDENT_AMBULATORY_CARE_PROVIDER_SITE_OTHER): Payer: Medicare Other | Admitting: Internal Medicine

## 2015-10-06 ENCOUNTER — Encounter: Payer: Self-pay | Admitting: Internal Medicine

## 2015-10-06 VITALS — BP 134/72 | HR 70 | Ht 67.0 in | Wt 277.8 lb

## 2015-10-06 DIAGNOSIS — G4733 Obstructive sleep apnea (adult) (pediatric): Secondary | ICD-10-CM | POA: Diagnosis not present

## 2015-10-06 DIAGNOSIS — J301 Allergic rhinitis due to pollen: Secondary | ICD-10-CM | POA: Diagnosis not present

## 2015-10-06 NOTE — Progress Notes (Signed)
[facility] Pulmonary Medicine Consultation      Assessment and Plan:  Obstructive sleep apnea. -Review of her compliance report shows excellent compliance, I also reviewed her auto titrating download which showed that her CPAP is adequately treating her obstructive sleep apnea without a level of 13. -We'll set her CPAP to a level of 13, if this is not tolerated, then could potentially disc keep her on auto set between 12 and 16. -Discussed the importance of continued CPAP use. -Asked her to follow-up with me on a as needed basis for sleep apnea issues.  Asthma with allergic bronchopulmonary aspergillosis. -Sleep apnea can potentially exacerbate asthma, therefore, we discussed the importance of continuing CPAP, and monitoring her asthma symptoms. -Continue Advair as prescribed. -Continue to follow up with Dr. Dema Severin as recommended.  Date: 10/06/2015  MRN# [id number] Rachael Silva 1946/01/11  Referring Physician: Dr. Dema Severin.   Rachael Silva is a 69 y.o. old female seen in consultation for chief complaint of:    Chief Complaint  Patient presents with  . SLEEP CONSULT    ref. by VM. pt. currently wears CPAP 7-8hr everynight. auto set to 5-15. DME:[facility] EPWORTH score:10    HPI:  The patient is a 69 year old female with a history of asthma, bronchopulmonary aspergillosis, sleep apnea.  She is currently using it every night, as confirmed by her compliance report. She notes that she has been feeling well, and doing better in terms of sleepiness. She is waking up more rested than in the past. She also notes that she can sleep longer than in the past.  Currently she feels that her breathing has been doing well, she is using advair daily.   -IgE from 08/05/15; 1253.  -RAST 07/03/15; numerous allergies.  -CBC in 2015 showed eosinophilia in 2014 and 2015, which appears resolved in her most recent testing in December of 2016 --Summary of above: Overall, patient appears to significant  allergic responses with significant eosinophilia. -One-month compliance report interpretation and summary the patient used CPAP for an average of about 7-1/2 hours per night. Minimum pressure was 5, maximum pressure was 15. 95th percentile pressure was 13 at this level. Her AHI was around 3.5. Overall pressure of 13 appeared to be adequate for this patient. Occasionally machine bumped up against the max pressure of 15, however, sometimes the patient had excessive leak at this level. Therefore, recommend a CPAP level of 13-14.  PMHX:   Past Medical History  Diagnosis Date  . Allergy     allergic rhinitis  . Asthma   . Arthritis     OA of knees  . Hiatal hernia   . Hypothyroid   . Labile blood pressure   . Urinary incontinence   . Pneumonia, eosinophilic (HCC)   . Angioedema 02/2006  . Frequent headaches   . Lung disease   . Pneumonia   . Hypertension   . Sleep apnea   . Complication of anesthesia     apnea- sleep  . Lower extremity edema   . Shortness of breath dyspnea   . Wheezing    Surgical Hx:  Past Surgical History  Procedure Laterality Date  . Cesarean section      x2  . Breast surgery  1966    breast biopsy  . Flexible bronchoscopy N/A 09/16/2015    Procedure: FLEXIBLE BRONCHOSCOPY WITH FLURO ;  Surgeon: Stephanie Acre, MD;  Location: [facility] ORS;  Service: Pulmonary;  Laterality: N/A;   Family Hx:  Family History  Problem Relation Age of  Onset  . Diabetes Mother     pre-diabetic  . Heart disease Mother     ? heart disease  . Emphysema Mother   . Thyroid disease Mother   . Diabetes Maternal Uncle    Social Hx:   Social History  Substance Use Topics  . Smoking status: Never Smoker   . Smokeless tobacco: Never Used  . Alcohol Use: No   Medication:   Current Outpatient Rx  Name  Route  Sig  Dispense  Refill  . albuterol (ACCUNEB) 0.63 MG/3ML nebulizer solution   Nebulization   Take 1 ampule by nebulization every 6 (six) hours as needed.           Marland Kitchen.  albuterol (PROVENTIL HFA;VENTOLIN HFA) 108 (90 BASE) MCG/ACT inhaler   Inhalation   Inhale 2 puffs into the lungs every 6 (six) hours as needed for wheezing or shortness of breath.         . B Complex-C-Folic Acid (STRESS B COMPLEX PO)   Oral   Take 1 tablet by mouth daily.         . Calcium Carbonate-Vitamin D (CALTRATE 600+D PO)   Oral   Take 1 tablet by mouth 2 (two) times daily.         Marland Kitchen. co-enzyme Q-10 30 MG capsule   Oral   Take 200 mg by mouth daily.         . fexofenadine (ALLEGRA ALLERGY) 60 MG tablet   Oral   Take 60 mg by mouth daily.          . Fluticasone-Salmeterol (ADVAIR DISKUS) 250-50 MCG/DOSE AEPB   Inhalation   Inhale 1 puff into the lungs 2 (two) times daily.          . furosemide (LASIX) 20 MG tablet      TAKE 1 TABLET (20 MG TOTAL) BY MOUTH DAILY.   30 tablet   5   . GARLIC PO   Oral   Take 1 capsule by mouth daily.         Marland Kitchen. glucose blood (ONE TOUCH ULTRA TEST) test strip      USE TO TEST TWICE DAILY Dx E09.9   50 each   11   . ibuprofen (ADVIL,MOTRIN) 800 MG tablet      TAKE 1 TABLET BY MOUTH EVERY 8 HOURS AS NEEDED FOR JOINT PAIN. TAKE WITH FOOD, STOP IT STOMACH UPSET   30 tablet   11   . levofloxacin (LEVAQUIN) 500 MG tablet   Oral   Take 1 tablet (500 mg total) by mouth daily.   7 tablet   0   . levothyroxine (SYNTHROID, LEVOTHROID) 50 MCG tablet      TAKE 1 TABLET BY MOUTH DAILY   30 tablet   3   . montelukast (SINGULAIR) 10 MG tablet   Oral   Take 10 mg by mouth daily.           . Multiple Vitamins-Minerals (CENTRUM ADULTS PO)   Oral   Take 1 tablet by mouth daily.         Marland Kitchen. omeprazole (PRILOSEC) 40 MG capsule   Oral   Take 1 capsule (40 mg total) by mouth daily. Patient taking differently: Take 20 mg by mouth daily.    30 capsule   5   . ONETOUCH DELICA LANCETS 33G MISC      TEST SUGARS TWICE DAILY Dx E09.9   100 each   3   . predniSONE (DELTASONE) 10  MG tablet      TAKE 1 TABLET (10 MG  TOTAL) BY MOUTH DAILY WITH BREAKFAST.   30 tablet   0       Allergies:  Erythromycin  Review of Systems: Gen:  Denies  fever, sweats, chills HEENT: Denies blurred vision, double vision. bleeds, sore throat Cvc:  No dizziness, chest pain. Resp:   Denies cough or sputum porduction, shortness of breath Gi: Denies swallowing difficulty, stomach pain. Gu:  Denies bladder incontinence, burning urine Ext:   No Joint pain, stiffness. Skin: No skin rash,  hives Endoc:  No polyuria, polydipsia. Psych: No depression, insomnia. Other:  All other systems were reviewed with the patient and were negative other that what is mentioned in the HPI.   Physical Examination:   VS: There were no vitals taken for this visit.  General Appearance: No distress  Neuro:without focal findings,  speech normal,  HEENT: PERRLA, EOM intact.   Pulmonary: normal breath sounds, No wheezing.  CardiovascularNormal S1,S2.  No m/r/g.   Abdomen: Benign, Soft, non-tender. Renal:  No costovertebral tenderness  GU:  No performed at this time. Endoc: No evident thyromegaly, no signs of acromegaly. Skin:   warm, no rashes, no ecchymosis  Extremities: normal, no cyanosis, clubbing.  Other findings:    LABORATORY PANEL:   CBC No results for input(s): WBC, HGB, HCT, PLT in the last 168 hours. ------------------------------------------------------------------------------------------------------------------  Chemistries  No results for input(s): NA, K, CL, CO2, GLUCOSE, BUN, CREATININE, CALCIUM, MG, AST, ALT, ALKPHOS, BILITOT in the last 168 hours.  Invalid input(s): GFRCGP ------------------------------------------------------------------------------------------------------------------  Cardiac Enzymes No results for input(s): TROPONINI in the last 168 hours. ------------------------------------------------------------  RADIOLOGY:  No results found.     Thank  you for the consultation and for allowing  [facility] [place] [facility] to assist in the care of your patient. Our recommendations are noted above.  Please contact us if we can be of further service.   Wells Guiles, MD.  Board Certified in Internal Medicine, Pulmonary Medicine, Critical Care Medicine, and Sleep Medicine.  [facility] Office Number: [phone]  [name], M.D.  Stephanie Acre, M.D.  Billy Fischer, M.D

## 2015-10-06 NOTE — Patient Instructions (Addendum)
--  Will set CPAP pressure at 13 cm H20. If this pressure proves intolerable could continue  on an auto level of between 12-16.  --Continue cpap use nightly.

## 2015-10-07 LAB — FUNGUS CULTURE W SMEAR: SPECIAL REQUESTS: NORMAL

## 2015-10-12 ENCOUNTER — Telehealth: Payer: Self-pay | Admitting: Internal Medicine

## 2015-10-12 DIAGNOSIS — G4733 Obstructive sleep apnea (adult) (pediatric): Secondary | ICD-10-CM

## 2015-10-12 NOTE — Telephone Encounter (Signed)
Spoke with the pt. States that she wants her CPAP machine settings changed. Feels like the pressure is too strong. Current setting is at 13cm.  Dr. Nicholos Johnsamachandran - please advise. Thanks.

## 2015-10-13 DIAGNOSIS — J301 Allergic rhinitis due to pollen: Secondary | ICD-10-CM | POA: Diagnosis not present

## 2015-10-14 NOTE — Telephone Encounter (Signed)
Dr. Ramachandran please advise. 

## 2015-10-15 NOTE — Telephone Encounter (Signed)
Called spoke with pt and is aware of recs. Order placed. Nothing further needed 

## 2015-10-15 NOTE — Telephone Encounter (Signed)
Return Her CPAP to auto-CPAP settings: Low of 11, high 15. We'll keep her on this level indefinitely.

## 2015-10-18 ENCOUNTER — Other Ambulatory Visit: Payer: Self-pay | Admitting: Family Medicine

## 2015-10-18 NOTE — Telephone Encounter (Signed)
Last refilled on 09/15/15 #30 with 0 refills, please advise

## 2015-10-18 NOTE — Telephone Encounter (Signed)
done

## 2015-10-18 NOTE — Telephone Encounter (Signed)
Please refill times 3 Thanks  

## 2015-10-21 DIAGNOSIS — J301 Allergic rhinitis due to pollen: Secondary | ICD-10-CM | POA: Diagnosis not present

## 2015-10-24 ENCOUNTER — Telehealth: Payer: Self-pay | Admitting: Family Medicine

## 2015-10-24 DIAGNOSIS — E099 Drug or chemical induced diabetes mellitus without complications: Secondary | ICD-10-CM

## 2015-10-24 DIAGNOSIS — E039 Hypothyroidism, unspecified: Secondary | ICD-10-CM

## 2015-10-24 DIAGNOSIS — E7800 Pure hypercholesterolemia, unspecified: Secondary | ICD-10-CM

## 2015-10-24 DIAGNOSIS — T380X5A Adverse effect of glucocorticoids and synthetic analogues, initial encounter: Secondary | ICD-10-CM

## 2015-10-24 NOTE — Telephone Encounter (Signed)
-----   Message from Alvina Chouerri J Walsh sent at 10/18/2015  2:46 PM EST ----- Regarding: Lab orders for Wednesday, 1.18.17 Lab orders for a 6 month follow up appt

## 2015-10-26 ENCOUNTER — Ambulatory Visit (INDEPENDENT_AMBULATORY_CARE_PROVIDER_SITE_OTHER): Payer: Medicare Other | Admitting: Internal Medicine

## 2015-10-26 ENCOUNTER — Encounter: Payer: Self-pay | Admitting: Internal Medicine

## 2015-10-26 VITALS — BP 134/76 | HR 89 | Ht 67.0 in | Wt 282.0 lb

## 2015-10-26 DIAGNOSIS — J82 Pulmonary eosinophilia, not elsewhere classified: Secondary | ICD-10-CM | POA: Diagnosis not present

## 2015-10-26 DIAGNOSIS — J8281 Chronic eosinophilic pneumonia: Secondary | ICD-10-CM

## 2015-10-26 DIAGNOSIS — B4481 Allergic bronchopulmonary aspergillosis: Secondary | ICD-10-CM | POA: Diagnosis not present

## 2015-10-26 MED ORDER — PREDNISONE 5 MG PO TABS: 7.5000 mg | ORAL_TABLET | Freq: Every day | ORAL | Status: DC

## 2015-10-26 NOTE — Patient Instructions (Signed)
Follow up with Dr. Dema Severin in: 2 months - continue with Prednisone  daily until Jan 31. Starting Feb 1, take 7.5mg  Prednisone until follow up.  If  Symptoms return on lower dose of prednisone, for 3-4 days consistently, then return to 10 mg daily until follow-up. - continue with Advair - Continue with allergy regiment - Continue with allergen avoidance - avoid sick contacts - wear a mask when around sick contacts or when visiting sick patients.

## 2015-10-26 NOTE — Assessment & Plan Note (Signed)
Chronic eosinophilic pneumonia   -diagnosed by history, symptoms, transbronchial biopsy and bronchoscopy on 09/24/2015   Plan:  continue with prednisone 10 mg daily until 11/08/2014, then start prednisone 7.5 mg daily until follow-up visit. If symptoms return on low dose, then go back to 10 mg daily prednisone. Avoid any allergens Continue Advair Continue allergy regiment Avoid sick contacts.

## 2015-10-26 NOTE — Assessment & Plan Note (Signed)
Again this is a working diagnosis, other differentials include hypersensitivity pneumonitis, eosinophilic pneumonias, NSIP, BOOP. All of these diagnoses are treated with high-dose steroids for a prolonged period of time. She does have significant clinical improvement once steroids were initiated, with rapid decline when steroids are terminated. For further workup with Aspergillus skin testing, IgG levels, IgA levels were within normal limits.  The results of the skin testing and immunoglobulin testing were discussed in detail with the patient, they reviewed by me personally, and the patient was given a copy of her results. Patient has been on steroids for over a year, we have discussed bronchoscopy many times in the past to rule out fungal infections/atypical infections, evaluate for eosinophilic pneumonia, however patient was not amenable to this, until today.  Patient educated on side effects of steroids (weight gain, wereretention, increased appetite, muscle weakness, bone degradation, dysfunctional calcium levels, elevated blood glucose levels, anxiety/agitation at night, decreased immune).  Will continue to taper steroid doses to the lowest possible dose that will provide symptomatic relief, the premise is to completely taper steroids off. Given her negative Aspergillus skin testing (this was done while patient on high-dose steroids, which could also give a false negative read), at some point in the future if steroids can be tapered off or patient has relapse of symptoms. Patient educated on the current diagnosis, she understands that this is not an active infection, it is most likely a hyperreactive reaction to antigen exposure. She does not need/require antifungal treatment at this time. This is her initial treatment course, if she has refractory or recurrent symptoms will consider adding antifungals at that time.  She is now status post  Bronchoscopy 09/2015: All cultures including fungal,  bacterial, AFB a currently negative to date. Transbronchial biopsy showed eosinophilic infiltration consistent with eosinophilic pneumonia Plan:  -continue atenolol and prednisone until end of January, then start 7.5 mg of prednisone daily on February 1 and continue until follow-up. If symptoms return consistently for 3-4 days after low-dose steroids , then patient is advised to restart 10 mg of prednisone daily until follow-up  Continue allergy regiment Continue Advair Continue allergen avoidance  avoid sick contacts

## 2015-10-26 NOTE — Progress Notes (Signed)
MRN# [id number] Rachael Silva 02/14/1946   CC: Chief Complaint  Patient presents with  . Follow-up    bronch results tried decreasing predisone to 7.5 got sick so went back to 10. SOB.     synopsis: 70 year old female past medical history of asthma. Being followed by pulmonary for chronic eosinophilic pneumonia, status post bronchoscopy in December 2016 with negative cultures for AFB and fungus. Tries broncho-biopsy-positive for eosinophilic infiltrate. Treated with steroids unable to wean since  October 2015.   Brief History:  06/18/14 IOV /Pulmonary consult   patient is a pleasant 70 yo female with PMHx of Asthma, nasal polyps, possible sinusitis, possible Eosinophilic Pneumonia, Arthritis, seen in consultation for chronic cough.   Patient stated that she started have a cough with yellowish sputum since April of 2015, the mucus production has dissipated a little, but the color can vary from grey to yellow at times.   Over the past year she has had recurrent bout of "bronchitis" treated with multiple rounds of antibiotics and steroids; her last round as 8/19, when she received a Zpak and prednisone for 10 days for recurrent cough with productive sputum production.   She states that her bronchitis episodes last about 1 week, then she get antibiotics and steroids, her symptoms (cough, sputum production, sob), improve; however, about 5 days after completing her antibiotics\steroids her symptoms start to reoccur.   She has seen and allergist (Dr. Clelia Croft) for recurrent allergies (dust) and is on OTC allegra.   She has also seen an ENT physician (Dr. Jenne Campus), for sinusitis symptoms, who stopped her nasal steroid possible due to her nasal polyps.   Patient states that she can walk about 1/2 block before getting short of breath, and cannot climb 1 flight of stairs. She is currently on lasix for leg swelling.   Patient has cats at home. She is a never smoker. 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   Patient has been using her albuterol on a daily basis since July.   07-02-14: presented to [facility] with worsening SOB\DOE, sputum production, noted to have elevated level of IgE, high suspicion for ABPA, started on prednisone 0.5mg /kg/QD (60mg  daily). She had allergy profile testing performed, serum. Rapid improvement per patient since starting steroids.  At today's visit he states that she is rapidly improve, since starting prednisone which was given to her at her last visit to pulmonology [facility]. Patient is currently on 60 mg of prednisone daily, being treated for suspected ABPA (allergic bronchopulmonary aspergillosis). Should also like to review the results of her recent CT chest scan. Today she endorses mild dyspnea on exertion, mild cough which is nonproductive. IgE >2400, started on high dose tapering steroids at 60mg  and schedule for further aspergillus testing  07-15-14   Testing looks like true negative for IgE immediate allergic reaction to aspergillus. Recognize that 60 mg prednisone may mask reaction to unknown degree, although it did not limit reaction to histamine control. Another mold or another hypersensitivity pathway might still cause the syndrome in question.  08-11-14 Patient states that she is doing well today, no complaints of shortness of breath, dyspnea on exertion, productive cough, she does state she has a mild runny nose with clear discharge. Patient is currently on a prednisone taper, she started 40 mg daily on Friday, October 30. Patient states that since  starting steroids she has gained approximately 10-12 pounds.  09-23-14 Patient states that she is doing better, no major complaints of sob, doe.  She does have mild productive cough since switching to prednisone  every other day, but she believes this is due to the  weather causing her allergies to act up. Cough with mild yellowish sputum production, in the mornings. She has lost 8 lbs since last visit Has bone density schedule for Jan 2016.   Plan:steroid taper is as follows: Starting September 28, 2014 prednisone 25 mg daily x2 weeks, then 25 mg every other day x2 weeks then 20 mg daily x2 weeks, then 20 mg every other day x2 weeks, then  daily x 2weeks, then  every other day x 2wks  11-27-14   Patient states that she is doing better, no major complaints of sob, doe.  She does have mild productive cough since switching to prednisone  every other day, but again,she believes this is due to the weather changes causing her allergies to act up. Cough with mild yellowish sputum production, in the mornings, this has not changed since the last visit.   No urgent care\ED visits for cough\sob.   Gained 7lbs since the last visit. Currently on prednisone  everyother day, scheduled to switch to prednisone  daily on 11/23/14.   12-29-14 Patient stated that she reduced to prednisone 15 mg every other day, started back having symptoms of cough, sputum production, dyspnea on exertion and wheezing (after the 3rd dose). She increased her dose back to Prednisone  every other day, and started having resolution of symptoms after the first dose (within 1-2 days).   Currently, with baseline dyspnea, no cough\sputum production. She did state that she has not been using her Advair every day, due to thinking that prednisone was sufficient.   Patient stated that she saw her ENT this morning, had decrease breath sounds, received an albuterol treatment, did not feel much difference.   Plan: prednisone  everyother day for 3-4 weeks, then  everyother day until follow up.    ROV 02-02-15 Patient presents for a follow up visit day of ABPA steroid taper. No major symptoms   Down to  prednisone everyother day, no worsening of symptoms, accompained by  daughter. Using Advair twice a day on a regular basis now.    Plan: Continue with   (3pills) everyother day until 02/18/15, then  (2pills) daily for 2 wks, then  everyother day until follow up. We had discussed bronchoscopy early in the diagnosis of ABPA, however, patient has refused bronchoscopy in the past and decided to forego with steroid treatment and symptomatic management. If we're unable to wean patient from steroids or symptoms return with a steroid reduction dose will need to strongly consider bronchoscopy with BAL for further evaluation of atypical infections, fungal infection. This was discussed with the patient and her daughter at this visit, for both verbalized understanding.  ROV 04/06/15 Patient presents for a follow up visit day of ABPA steroid taper. Mild symptoms of a mild cough  and mild DOE with stir climbing at the end of the day.   Down to  prednisone everyother day, no worsening of symptoms. Using Advair twice a day on a regular basis now.     Patient states that she is currently off all oral diabetic meds now, she is only on DM monitoring Plan: Currently we are trying to taper her prednisone dose to the lowest possible effective dose that gives her the greatest clinical  outcome. Continue with Prednisone  (2pills) everyother day until 04/11/15, then 7.5mg  (1.5pills) daily for 2 wks, then 7.5mg  everyother day until follow up   We had discussed bronchoscopy early in the diagnosis of ABPA, however, patient has refused bronchoscopy in the past and decided to forego with steroid treatment and symptomatic management. If we're unable to wean patient from steroids or symptoms return with a steroid reduction dose will need to strongly consider bronchoscopy with BAL for further evaluation of atypical infections, fungal infection. This was discussed with the patient and her daughter at this visit, for both verbalized understanding.  ROV 05/11/15 - sleep study eval She  presents for a sleep evaluation. She complains of snoring.  Symptoms began several months ago, stable since that time. She goes to sleep at 9 weekdays and 10 weekends. She awakens 7 weekdays and 8 weekends. She falls asleep in 10 minutes.  Collar size 18. She denies snoring.   She notes that she does not wake up refreshed, she is often sleepy during the day. She often finds herself nodding off in the afternoon when doing activities such as using a computer or watching television. She has not had any close calls while at the wheel. She is not fallen asleep while driving. Plan: sleep study   ROV 06/07/15 Presents today for a follow up visit of suspected ABPA, was weaned down to 7.5mg  until last Wednesday, started having increased cough with yellow sputum production. She increased her prednisone to  daily, and her symptoms improved.  Overall with good clinical response to steroids, but difficult and prolonged wean. Saw sleep specialist for OSA eval, has not gotten PSG performed yet.   Plan: Patient wean to 7.5mg  daily, until last Wednesday, when the weather changed (more humid and hot), and her symptoms became worst, necessitating increase in Prednisone back to  daily.   Currently we are trying to taper her prednisone dose to the lowest possible effective dose that gives her the greatest clinical outcome. Continue with Prednisone  of Prednisone until 06/09/15, then starting 06/11/15 goto  prednisone everyother day for 1 month (entire September), then start prednisone 7.5mg  every other day on 07/10/15. We had discussed bronchoscopy early in the diagnosis of ABPA, however, patient has refused bronchoscopy in the past and decided to forego with steroid treatment and symptomatic management. If we're unable to wean patient from steroids or symptoms return with a steroid reduction dose will need to strongly consider bronchoscopy with BAL for further evaluation of atypical infections, fungal  infection.   ROV 09/09/15: She presents today for a follow up visit of CEP/ABPA, also has questions about the bronchoscopy procedure. Currently on  prednisone daily, which is keep her symptoms well managed. Patient daughter is present during today's visit.  Admits to mild cough, nasal congestion and drainage Plan - bronchoscopy with BAL and TBBx, short abx course, cont with steroid.   Events since last clinic visit: Patient presents today for follow-up visit of chronic eosinophilic pneumonia/ABPA,  Status post bronchoscopy. Review of her bronchoscopy cultures showed that she has normal respiratory flora, there is no fungal elements at this time, there is no AFB positive elements at this time. Also since her last visit she had a recurrent upper respiratory tract infection and was treated with antibiotics and increased dose of prednisone 10 mg. Since that event she's been continued on 10 mg of prednisone daily.     Medication:   Current Outpatient Rx  Name  Route  Sig  Dispense  Refill  . albuterol (ACCUNEB) 0.63 MG/3ML nebulizer solution   Nebulization   Take 1 ampule by nebulization every 6 (six) hours as needed.           Marland Kitchen albuterol (PROVENTIL HFA;VENTOLIN HFA) 108 (90 BASE) MCG/ACT inhaler   Inhalation   Inhale 2 puffs into the lungs every 6 (six) hours as needed for wheezing or shortness of breath.         . B Complex-C-Folic Acid (STRESS B COMPLEX PO)   Oral   Take 1 tablet by mouth daily.         . Calcium Carbonate-Vitamin D (CALTRATE 600+D PO)   Oral   Take 1 tablet by mouth 2 (two) times daily.         Marland Kitchen co-enzyme Q-10 30 MG capsule   Oral   Take 200 mg by mouth daily.         Marland Kitchen EPIPEN 2-PAK 0.3 MG/0.3ML SOAJ injection      See admin instructions.      99     Dispense as written.   . fexofenadine (ALLEGRA ALLERGY) 60 MG tablet   Oral   Take 60 mg by mouth daily.          . Fluticasone-Salmeterol (ADVAIR DISKUS) 250-50 MCG/DOSE AEPB    Inhalation   Inhale 1 puff into the lungs 2 (two) times daily.          . furosemide (LASIX) 20 MG tablet      TAKE 1 TABLET (20 MG TOTAL) BY MOUTH DAILY.   30 tablet   5   . GARLIC PO   Oral   Take 1 capsule by mouth daily.         Marland Kitchen glucose blood (ONE TOUCH ULTRA TEST) test strip      USE TO TEST TWICE DAILY Dx E09.9   50 each   11   . ibuprofen (ADVIL,MOTRIN) 800 MG tablet      TAKE 1 TABLET BY MOUTH EVERY 8 HOURS AS NEEDED FOR JOINT PAIN. TAKE WITH FOOD, STOP IT STOMACH UPSET   30 tablet   11   . levofloxacin (LEVAQUIN) 500 MG tablet   Oral   Take 1 tablet (500 mg total) by mouth daily.   7 tablet   0   . levothyroxine (SYNTHROID, LEVOTHROID) 50 MCG tablet      TAKE 1 TABLET BY MOUTH DAILY   30 tablet   3   . montelukast (SINGULAIR) 10 MG tablet   Oral   Take 10 mg by mouth daily.           . Multiple Vitamins-Minerals (CENTRUM ADULTS PO)   Oral   Take 1 tablet by mouth daily.         Marland Kitchen omeprazole (PRILOSEC) 40 MG capsule   Oral   Take 1 capsule (40 mg total) by mouth daily. Patient taking differently: Take 20 mg by mouth daily.    30 capsule   5   . ONETOUCH DELICA LANCETS 33G MISC      TEST SUGARS TWICE DAILY Dx E09.9   100 each   3   . predniSONE (DELTASONE) 10 MG tablet      TAKE 1 TABLET (10 MG TOTAL) BY MOUTH DAILY WITH BREAKFAST.   30 tablet   2   . predniSONE (DELTASONE) 5 MG tablet   Oral   Take 1.5 tablets (7.5 mg total) by mouth daily with breakfast.   75 tablet   0  Review of Systems: Gen:  Denies  fever, sweats, chills HEENT: Denies blurred vision, double vision, ear pain, eye pain, hearing loss, nose bleeds, sore throat Cvc:  No dizziness, chest pain or heaviness Resp:   Admits RU:EAVWU, DOE (stable), nasal congestion Gi: Denies swallowing difficulty, stomach pain, nausea or vomiting, diarrhea, constipation, bowel incontinence Gu:  Denies bladder incontinence, burning urine Ext:   No Joint pain, stiffness or  swelling Skin: No skin rash, easy bruising or bleeding or hives Endoc:  No polyuria, polydipsia , polyphagia or weight change Other:  All other systems negative  Allergies:  Erythromycin  Physical Examination:  VS: BP 134/76 mmHg  Pulse 89  Ht  (1.702 m)  Wt 282 lb (127.914 kg)  BMI 44.16 kg/m2  SpO2 96%  General Appearance: No distress  HEENT: PERRLA, no ptosis, no other lesions noticed Pulmonary:normal breath sounds., diaphragmatic excursion normal.No wheezing, No rales   Cardiovascular:  Normal S1,S2.  No m/r/g.     Abdomen:Exam: Benign, Soft, non-tender, No masses  Skin:   warm, no rashes, no ecchymosis  Extremities: normal, no cyanosis, clubbing, warm with normal capillary refill.    Radiology: (The following images and results were reviewed by Dr. Dema Severin on 10/26/2015).   CT Chest 08/23/15 COMPARISON: Chest CT, 06/23/2014  FINDINGS: Thoracic inlet: No mass or adenopathy. Thyroid is unremarkable.  Mediastinum and hila: Heart normal in size and configuration. There is a focus of calcification within the left ventricle, unchanged. Minor coronary artery calcifications. Great vessels are normal in caliber. Several mildly prominent mediastinal lymph nodes. Largest is a right peritracheal node measuring 11 mm in short axis, previously 14 mm. Other lymph nodes are either smaller or stable from the prior CT. No hilar masses or adenopathy.  Lungs and pleura: No evidence of pneumonia or edema. No mass or suspicious nodule.  Primarily linear and discoid type opacity is noted in the anteromedial right middle lobe, left upper lobe lingula and lower lobes, right greater than left, similar to the prior study, consistent with chronic atelectasis/ scarring.  No pleural effusion. No pneumothorax.  Limited upper abdomen: Fat containing Bochdalek's hernia on the right. Otherwise unremarkable.  Musculoskeletal: Degenerative changes noted along the thoracic spine.  No osteoblastic or osteolytic lesions.  IMPRESSION: 1. No acute findings. 2. Areas of lung opacity noted in the left upper lobe lingula, right middle lobe and both lower lobes is similar to the prior study, most consistent with scarring and/or chronic atelectasis. No evidence of pneumonia or edema. No mass or suspicious nodule. 3. Mildly prominent mediastinal lymph nodes, which are either stable or decreased in size from the prior CT, consistent with reactive nodes.   Assessment and Plan: 70 year old female with chronic cough, recurrent URIs, being treated as ABPA/chronic eosinophilic pneumonia, unable to wean her steroids EP (eosinophilic pneumonia) (HCC)  Chronic eosinophilic pneumonia   -diagnosed by history, symptoms, transbronchial biopsy and bronchoscopy on 09/24/2015   Plan:  continue with prednisone 10 mg daily until 11/08/2014, then start prednisone 7.5 mg daily until follow-up visit. If symptoms return on low dose, then go back to 10 mg daily prednisone. Avoid any allergens Continue Advair Continue allergy regiment Avoid sick contacts.  ABPA (allergic bronchopulmonary aspergillosis) Again this is a working diagnosis, other differentials include hypersensitivity pneumonitis, eosinophilic pneumonias, NSIP, BOOP. All of these diagnoses are treated with high-dose steroids for a prolonged period of time. She does have significant clinical improvement once steroids were initiated, with rapid decline when steroids are terminated. For further workup  with Aspergillus skin testing, IgG levels, IgA levels were within normal limits.  The results of the skin testing and immunoglobulin testing were discussed in detail with the patient, they reviewed by me personally, and the patient was given a copy of her results. Patient has been on steroids for over a year, we have discussed bronchoscopy many times in the past to rule out fungal infections/atypical infections, evaluate for eosinophilic  pneumonia, however patient was not amenable to this, until today.  Patient educated on side effects of steroids (weight gain, wereretention, increased appetite, muscle weakness, bone degradation, dysfunctional calcium levels, elevated blood glucose levels, anxiety/agitation at night, decreased immune).  Will continue to taper steroid doses to the lowest possible dose that will provide symptomatic relief, the premise is to completely taper steroids off. Given her negative Aspergillus skin testing (this was done while patient on high-dose steroids, which could also give a false negative read), at some point in the future if steroids can be tapered off or patient has relapse of symptoms. Patient educated on the current diagnosis, she understands that this is not an active infection, it is most likely a hyperreactive reaction to antigen exposure. She does not need/require antifungal treatment at this time. This is her initial treatment course, if she has refractory or recurrent symptoms will consider adding antifungals at that time.  She is now status post  Bronchoscopy 09/2015: All cultures including fungal, bacterial, AFB a currently negative to date. Transbronchial biopsy showed eosinophilic infiltration consistent with eosinophilic pneumonia Plan:  -continue atenolol and prednisone until end of January, then start 7.5 mg of prednisone daily on February 1 and continue until follow-up. If symptoms return consistently for 3-4 days after low-dose steroids , then patient is advised to restart 10 mg of prednisone daily until follow-up  Continue allergy regiment Continue Advair Continue allergen avoidance  avoid sick contacts       Updated Medication List Outpatient Encounter Prescriptions as of 10/26/2015  Medication Sig  . albuterol (ACCUNEB) 0.63 MG/3ML nebulizer solution Take 1 ampule by nebulization every 6 (six) hours as needed.    Marland Kitchen albuterol (PROVENTIL HFA;VENTOLIN HFA) 108 (90 BASE) MCG/ACT  inhaler Inhale 2 puffs into the lungs every 6 (six) hours as needed for wheezing or shortness of breath.  . B Complex-C-Folic Acid (STRESS B COMPLEX PO) Take 1 tablet by mouth daily.  . Calcium Carbonate-Vitamin D (CALTRATE 600+D PO) Take 1 tablet by mouth 2 (two) times daily.  Marland Kitchen co-enzyme Q-10 30 MG capsule Take 200 mg by mouth daily.  Marland Kitchen EPIPEN 2-PAK 0.3 MG/0.3ML SOAJ injection See admin instructions.  . fexofenadine (ALLEGRA ALLERGY) 60 MG tablet Take 60 mg by mouth daily.   . Fluticasone-Salmeterol (ADVAIR DISKUS) 250-50 MCG/DOSE AEPB Inhale 1 puff into the lungs 2 (two) times daily.   . furosemide (LASIX) 20 MG tablet TAKE 1 TABLET (20 MG TOTAL) BY MOUTH DAILY.  Marland Kitchen GARLIC PO Take 1 capsule by mouth daily.  Marland Kitchen glucose blood (ONE TOUCH ULTRA TEST) test strip USE TO TEST TWICE DAILY Dx E09.9  . ibuprofen (ADVIL,MOTRIN) 800 MG tablet TAKE 1 TABLET BY MOUTH EVERY 8 HOURS AS NEEDED FOR JOINT PAIN. TAKE WITH FOOD, STOP IT STOMACH UPSET  . levofloxacin (LEVAQUIN) 500 MG tablet Take 1 tablet (500 mg total) by mouth daily.  Marland Kitchen levothyroxine (SYNTHROID, LEVOTHROID) 50 MCG tablet TAKE 1 TABLET BY MOUTH DAILY  . montelukast (SINGULAIR) 10 MG tablet Take 10 mg by mouth daily.    . Multiple Vitamins-Minerals (CENTRUM ADULTS  PO) Take 1 tablet by mouth daily.  Marland Kitchen omeprazole (PRILOSEC) 40 MG capsule Take 1 capsule (40 mg total) by mouth daily. (Patient taking differently: Take 20 mg by mouth daily. )  . ONETOUCH DELICA LANCETS 33G MISC TEST SUGARS TWICE DAILY Dx E09.9  . predniSONE (DELTASONE) 10 MG tablet TAKE 1 TABLET (10 MG TOTAL) BY MOUTH DAILY WITH BREAKFAST.  Marland Kitchen predniSONE (DELTASONE) 5 MG tablet Take 1.5 tablets (7.5 mg total) by mouth daily with breakfast.   No facility-administered encounter medications on file as of 10/26/2015.    Orders for this visit: No orders of the defined types were placed in this encounter.    Thank  you for the visitation and for allowing  [facility]  to assist in the care of your patient. Our recommendations are noted above.  Please contact us if we can be of further service.  Stephanie Acre, MD [facility] Office Number: [phone]

## 2015-10-27 ENCOUNTER — Other Ambulatory Visit (INDEPENDENT_AMBULATORY_CARE_PROVIDER_SITE_OTHER): Payer: Medicare Other

## 2015-10-27 ENCOUNTER — Telehealth: Payer: Self-pay | Admitting: Family Medicine

## 2015-10-27 DIAGNOSIS — T380X5A Adverse effect of glucocorticoids and synthetic analogues, initial encounter: Secondary | ICD-10-CM

## 2015-10-27 DIAGNOSIS — E039 Hypothyroidism, unspecified: Secondary | ICD-10-CM | POA: Diagnosis not present

## 2015-10-27 DIAGNOSIS — J301 Allergic rhinitis due to pollen: Secondary | ICD-10-CM | POA: Diagnosis not present

## 2015-10-27 DIAGNOSIS — E7800 Pure hypercholesterolemia, unspecified: Secondary | ICD-10-CM

## 2015-10-27 DIAGNOSIS — E099 Drug or chemical induced diabetes mellitus without complications: Secondary | ICD-10-CM

## 2015-10-27 LAB — LIPID PANEL
CHOL/HDL RATIO: 3
Cholesterol: 194 mg/dL (ref 0–200)
HDL: 73.4 mg/dL (ref >39.00)
LDL CALC: 110 mg/dL — AB (ref 0–99)
NonHDL: 120.85
Triglycerides: 55 mg/dL (ref 0.0–149.0)
VLDL: 11 mg/dL (ref 0.0–40.0)

## 2015-10-27 LAB — COMPREHENSIVE METABOLIC PANEL
ALT: 12 U/L (ref 0–35)
AST: 13 U/L (ref 0–37)
Albumin: 3.5 g/dL (ref 3.5–5.2)
Alkaline Phosphatase: 72 U/L (ref 39–117)
BUN: 14 mg/dL (ref 6–23)
CHLORIDE: 105 meq/L (ref 96–112)
CO2: 30 mEq/L (ref 19–32)
CREATININE: 0.97 mg/dL (ref 0.40–1.20)
Calcium: 8.9 mg/dL (ref 8.4–10.5)
GFR: 73.5 mL/min (ref >60.00)
GLUCOSE: 78 mg/dL (ref 70–99)
POTASSIUM: 3.8 meq/L (ref 3.5–5.1)
SODIUM: 142 meq/L (ref 135–145)
Total Bilirubin: 0.4 mg/dL (ref 0.2–1.2)
Total Protein: 6.8 g/dL (ref 6.0–8.3)

## 2015-10-27 LAB — HEMOGLOBIN A1C: Hgb A1c MFr Bld: 6.5 % (ref 4.6–6.5)

## 2015-10-27 LAB — TSH: TSH: 3.23 u[IU]/mL (ref 0.35–4.50)

## 2015-10-27 NOTE — Telephone Encounter (Signed)
error 

## 2015-10-30 LAB — ACID FAST SMEAR+CULTURE W/RFLX (ARMC ONLY)
ACID FAST CULTURE: NEGATIVE
ACID FAST SMEAR: NEGATIVE

## 2015-10-30 LAB — ASPERGILLUS ANTIGEN, BAL/SERUM: Aspergillus Ag, BAL/Serum: 0.1 Index (ref 0.00–0.49)

## 2015-11-01 ENCOUNTER — Other Ambulatory Visit: Payer: Self-pay | Admitting: Family Medicine

## 2015-11-03 ENCOUNTER — Ambulatory Visit: Payer: Medicare Other | Admitting: Family Medicine

## 2015-11-03 DIAGNOSIS — J301 Allergic rhinitis due to pollen: Secondary | ICD-10-CM | POA: Diagnosis not present

## 2015-11-05 ENCOUNTER — Encounter: Payer: Self-pay | Admitting: Family Medicine

## 2015-11-05 ENCOUNTER — Ambulatory Visit (INDEPENDENT_AMBULATORY_CARE_PROVIDER_SITE_OTHER): Payer: Medicare Other | Admitting: Family Medicine

## 2015-11-05 VITALS — BP 120/80 | HR 70 | Temp 98.2°F | Wt 277.5 lb

## 2015-11-05 DIAGNOSIS — E039 Hypothyroidism, unspecified: Secondary | ICD-10-CM

## 2015-11-05 DIAGNOSIS — E099 Drug or chemical induced diabetes mellitus without complications: Secondary | ICD-10-CM

## 2015-11-05 DIAGNOSIS — N951 Menopausal and female climacteric states: Secondary | ICD-10-CM | POA: Diagnosis not present

## 2015-11-05 DIAGNOSIS — T380X5A Adverse effect of glucocorticoids and synthetic analogues, initial encounter: Secondary | ICD-10-CM

## 2015-11-05 DIAGNOSIS — E7800 Pure hypercholesterolemia, unspecified: Secondary | ICD-10-CM | POA: Diagnosis not present

## 2015-11-05 NOTE — Progress Notes (Signed)
Subjective:    Patient ID: Rachael Silva, female    DOB: 12/29/1945, 70 y.o.   MRN: [id number]  HPI Here for f/u of chronic health problems   Wt is down 5 lb with bmi of 43 She is trying hard to get her wt down on prednisone  Tries to exercise  Eats pretty healthy   Is miserable with sweats  No fevers  Has not had a hysterectomy  Stopped menses - around 55    Lab Results  Component Value Date   TSH 3.23 10/27/2015      Has EP and ABPA- has been unable to wean her prednisone-- in the midst of testing  Rev last pulm note Still at 10 mg and will dec to 7.5 in feb  Breathing is pretty good right now     BP Readings from Last 3 Encounters:  11/05/15 136/78  10/26/15 134/76  10/06/15 134/72     DM Lab Results  Component Value Date   HGBA1C 6.5 10/27/2015   she does get occ low blood sugars  Last A1C 6.4  Lowest was 67  This will happen when she eats sugar and then it happens several hours later    Cholesterol Lab Results  Component Value Date   CHOL 194 10/27/2015   CHOL 242* 08/05/2015   CHOL 204* 03/24/2015   Lab Results  Component Value Date   HDL 73.40 10/27/2015   HDL 60 08/05/2015   HDL 66.30 03/24/2015   Lab Results  Component Value Date   LDLCALC 110* 10/27/2015   LDLCALC 165* 08/05/2015   LDLCALC 123* 03/24/2015   Lab Results  Component Value Date   TRIG 55.0 10/27/2015   TRIG 83 08/05/2015   TRIG 75.0 03/24/2015   Lab Results  Component Value Date   CHOLHDL 3 10/27/2015   CHOLHDL 4.0 08/05/2015   CHOLHDL 3 03/24/2015   Lab Results  Component Value Date   LDLDIRECT 138.9 04/07/2011   LDLDIRECT 123.4 09/26/2010   LDLDIRECT 139.1 04/05/2010   LDL is significantly improved  HDL is higher also She cut out cheese and other fatty foods/ also discovered her butter sub was full of sat fat   Patient Active Problem List   Diagnosis Date Noted  . Chronic cough   . EP (eosinophilic pneumonia) (HCC)   . Memory impairment 05/11/2015    . Excessive daytime sleepiness 05/11/2015  . Snoring 05/05/2015  . Obesity 05/05/2015  . Steroid-induced diabetes mellitus (HCC) 11/11/2014  . Glucocorticoids and synthetic analogues causing adverse effect in therapeutic use 08/27/2014  . Long term current use of systemic steroids 08/27/2014  . Estrogen deficiency 08/27/2014  . ABPA (allergic bronchopulmonary aspergillosis) (HCC) 07/08/2014  . Cough 06/18/2014  . Asthma, chronic 06/18/2014  . SOB (shortness of breath) 06/18/2014  . Productive cough 05/27/2014  . Bursitis, shoulder 12/09/2013  . Acute bronchitis with bronchospasm 05/23/2013  . Abnormal mammogram with microcalcification 06/06/2011  . Other screening mammogram 04/17/2011  . Routine general medical examination at a health care facility 04/17/2011  . Hypothyroidism 06/01/2009  . EDEMA 03/03/2009  . SINUSITIS, CHRONIC 08/18/2008  . ARTHRITIS, RIGHT KNEE 04/15/2008  . Weight gain due to medication 02/18/2008  . HOT FLASHES 02/18/2008  . GERD 08/01/2007  . HYPERCHOLESTEROLEMIA 05/15/2007  . ALLERGIC RHINITIS 05/15/2007  . ASTHMA 05/15/2007  . PERIODONTAL DISEASE 05/15/2007  . MURMUR 05/15/2007  . URINARY INCONTINENCE 05/15/2007   Past Medical History  Diagnosis Date  . Allergy     allergic rhinitis  .  Asthma   . Arthritis     OA of knees  . Hiatal hernia   . Hypothyroid   . Labile blood pressure   . Urinary incontinence   . Pneumonia, eosinophilic (HCC)   . Angioedema 02/2006  . Frequent headaches   . Lung disease   . Pneumonia   . Hypertension   . Sleep apnea   . Complication of anesthesia     apnea- sleep  . Lower extremity edema   . Shortness of breath dyspnea   . Wheezing    Past Surgical History  Procedure Laterality Date  . Cesarean section      x2  . Breast surgery  1966    breast biopsy  . Flexible bronchoscopy N/A 09/16/2015    Procedure: FLEXIBLE BRONCHOSCOPY WITH FLURO ;  Surgeon: Stephanie Acre, MD;  Location: [facility] ORS;  Service:  Pulmonary;  Laterality: N/A;   Social History  Substance Use Topics  . Smoking status: Never Smoker   . Smokeless tobacco: Never Used  . Alcohol Use: No   Family History  Problem Relation Age of Onset  . Diabetes Mother     pre-diabetic  . Heart disease Mother     ? heart disease  . Emphysema Mother   . Thyroid disease Mother   . Diabetes Maternal Uncle    Allergies  Allergen Reactions  . Erythromycin     REACTION: feels sick   Current Outpatient Prescriptions on File Prior to Visit  Medication Sig Dispense Refill  . albuterol (ACCUNEB) 0.63 MG/3ML nebulizer solution Take 1 ampule by nebulization every 6 (six) hours as needed.      Marland Kitchen albuterol (PROVENTIL HFA;VENTOLIN HFA) 108 (90 BASE) MCG/ACT inhaler Inhale 2 puffs into the lungs every 6 (six) hours as needed for wheezing or shortness of breath.    . B Complex-C-Folic Acid (STRESS B COMPLEX PO) Take 1 tablet by mouth daily.    . Calcium Carbonate-Vitamin D (CALTRATE 600+D PO) Take 1 tablet by mouth 2 (two) times daily.    Marland Kitchen co-enzyme Q-10 30 MG capsule Take 200 mg by mouth daily.    Marland Kitchen EPIPEN 2-PAK 0.3 MG/0.3ML SOAJ injection See admin instructions.  99  . fexofenadine (ALLEGRA ALLERGY) 60 MG tablet Take 60 mg by mouth daily.     . Fluticasone-Salmeterol (ADVAIR DISKUS) 250-50 MCG/DOSE AEPB Inhale 1 puff into the lungs 2 (two) times daily.     . furosemide (LASIX) 20 MG tablet TAKE 1 TABLET (20 MG TOTAL) BY MOUTH DAILY. 30 tablet 0  . GARLIC PO Take 1 capsule by mouth daily.    Marland Kitchen glucose blood (ONE TOUCH ULTRA TEST) test strip USE TO TEST TWICE DAILY Dx E09.9 50 each 11  . ibuprofen (ADVIL,MOTRIN) 800 MG tablet TAKE 1 TABLET BY MOUTH EVERY 8 HOURS AS NEEDED FOR JOINT PAIN. TAKE WITH FOOD, STOP IT STOMACH UPSET 30 tablet 11  . levothyroxine (SYNTHROID, LEVOTHROID) 50 MCG tablet TAKE 1 TABLET BY MOUTH DAILY 30 tablet 0  . montelukast (SINGULAIR) 10 MG tablet Take 10 mg by mouth daily.      . Multiple Vitamins-Minerals (CENTRUM  ADULTS PO) Take 1 tablet by mouth daily.    Marland Kitchen omeprazole (PRILOSEC) 40 MG capsule Take 1 capsule (40 mg total) by mouth daily. (Patient taking differently: Take 20 mg by mouth daily. ) 30 capsule 5  . ONETOUCH DELICA LANCETS 33G MISC TEST SUGARS TWICE DAILY Dx E09.9 100 each 3  . predniSONE (DELTASONE) 10 MG tablet TAKE 1  TABLET (10 MG TOTAL) BY MOUTH DAILY WITH BREAKFAST. 30 tablet 2  . predniSONE (DELTASONE) 5 MG tablet Take 1.5 tablets (7.5 mg total) by mouth daily with breakfast. (Patient not taking: Reported on 11/05/2015) 75 tablet 0   No current facility-administered medications on file prior to visit.    Review of Systems    Review of Systems  Constitutional: Negative for fever, appetite change, fatigue and unexpected weight change.  Eyes: Negative for pain and visual disturbance.  Respiratory: Negative for cough and pos for baseline  shortness of breath.  (limiting her exercise) Cardiovascular: Negative for cp or palpitations    Gastrointestinal: Negative for nausea, diarrhea and constipation.  Genitourinary: Negative for urgency and frequency.  Skin: Negative for pallor or rash   Neurological: Negative for weakness, light-headedness, numbness and headaches.  Hematological: Negative for adenopathy. Does not bruise/bleed easily.  Psychiatric/Behavioral: Negative for dysphoric mood. The patient is not nervous/anxious.      Objective:   Physical Exam  Constitutional: She appears well-developed and well-nourished. No distress.  obese and well appearing   HENT:  Head: Normocephalic and atraumatic.  Mouth/Throat: Oropharynx is clear and moist.  Eyes: Conjunctivae and EOM are normal. Pupils are equal, round, and reactive to light.  Neck: Normal range of motion. Neck supple. No JVD present. Carotid bruit is not present. No thyromegaly present.  Cardiovascular: Normal rate, regular rhythm, normal heart sounds and intact distal pulses.  Exam reveals no gallop.   Pulmonary/Chest: Effort  normal. No respiratory distress. She has wheezes. She has no rales. She exhibits no tenderness.  No crackles  Distant bs with scant wheeze on forced expiration  Abdominal: Soft. Bowel sounds are normal. She exhibits no distension, no abdominal bruit and no mass. There is no tenderness.  Musculoskeletal: She exhibits no edema or tenderness.  Lymphadenopathy:    She has no cervical adenopathy.  Neurological: She is alert. She has normal reflexes. No cranial nerve deficit. She exhibits normal muscle tone. Coordination normal.  No tremor   Skin: Skin is warm and dry. No rash noted.  Psychiatric: She has a normal mood and affect.          Assessment & Plan:   Problem List Items Addressed This Visit      Endocrine   Hypothyroidism    Hypothyroidism  Pt has no clinical changes  (sweats continue that may be menopausal-general heat intol)  No change in energy level/ hair or skin/ edema and no tremor Lab Results  Component Value Date   TSH 3.23 10/27/2015          Steroid-induced diabetes mellitus (HCC) - Primary    Fairly stable  Lab Results  Component Value Date   HGBA1C 6.5 10/27/2015   Enc low glycemic diet and wt loss Again trying to gradually wean prednisone -not successful yet         Other   HOT FLASHES    Since menopause with general heat intolerance Prednisone is no doubt worsening this  She voiced interest in HRT for this Disc pros/cons risks Will first try black kohash over the counter and see if this helps       HYPERCHOLESTEROLEMIA    Improved this check Commended on better diet /less sat fats Disc goals for lipids and reasons to control them Rev labs with pt Rev low sat fat diet in detail

## 2015-11-05 NOTE — Progress Notes (Signed)
Pre visit review using our clinic review tool, if applicable. No additional management support is needed unless otherwise documented below in the visit note. 

## 2015-11-05 NOTE — Patient Instructions (Addendum)
For hot flashes - try a product with black kohash over the counter  Hormone replacement therapy can increase risk of blood clots and also breast cancer  Let me know if you still want to try it later   Keep up the good job with weight loss and exercise- but work on diet  Avoid sweets / make sure you get lean protein with every meal - to keep blood sugars as even possible   Follow up in 6 months for annual exam with labs prior

## 2015-11-07 NOTE — Assessment & Plan Note (Signed)
Since menopause with general heat intolerance Prednisone is no doubt worsening this  She voiced interest in HRT for this Disc pros/cons risks Will first try black kohash over the counter and see if this helps

## 2015-11-07 NOTE — Assessment & Plan Note (Signed)
Improved this check Commended on better diet /less sat fats Disc goals for lipids and reasons to control them Rev labs with pt Rev low sat fat diet in detail

## 2015-11-07 NOTE — Assessment & Plan Note (Signed)
Hypothyroidism  Pt has no clinical changes  (sweats continue that may be menopausal-general heat intol)  No change in energy level/ hair or skin/ edema and no tremor Lab Results  Component Value Date   TSH 3.23 10/27/2015

## 2015-11-07 NOTE — Assessment & Plan Note (Signed)
Fairly stable  Lab Results  Component Value Date   HGBA1C 6.5 10/27/2015   Enc low glycemic diet and wt loss Again trying to gradually wean prednisone -not successful yet

## 2015-11-11 DIAGNOSIS — J301 Allergic rhinitis due to pollen: Secondary | ICD-10-CM | POA: Diagnosis not present

## 2015-11-18 DIAGNOSIS — J301 Allergic rhinitis due to pollen: Secondary | ICD-10-CM | POA: Diagnosis not present

## 2015-11-25 DIAGNOSIS — J301 Allergic rhinitis due to pollen: Secondary | ICD-10-CM | POA: Diagnosis not present

## 2015-11-26 DIAGNOSIS — J301 Allergic rhinitis due to pollen: Secondary | ICD-10-CM | POA: Diagnosis not present

## 2015-11-30 ENCOUNTER — Other Ambulatory Visit: Payer: Self-pay | Admitting: Family Medicine

## 2015-11-30 NOTE — Telephone Encounter (Signed)
Will refill electronically  

## 2015-12-02 ENCOUNTER — Other Ambulatory Visit: Payer: Self-pay | Admitting: Family Medicine

## 2015-12-08 DIAGNOSIS — J301 Allergic rhinitis due to pollen: Secondary | ICD-10-CM | POA: Diagnosis not present

## 2015-12-23 DIAGNOSIS — J301 Allergic rhinitis due to pollen: Secondary | ICD-10-CM | POA: Diagnosis not present

## 2015-12-29 DIAGNOSIS — J45991 Cough variant asthma: Secondary | ICD-10-CM | POA: Diagnosis not present

## 2015-12-29 DIAGNOSIS — J339 Nasal polyp, unspecified: Secondary | ICD-10-CM | POA: Diagnosis not present

## 2015-12-29 DIAGNOSIS — J301 Allergic rhinitis due to pollen: Secondary | ICD-10-CM | POA: Diagnosis not present

## 2015-12-30 DIAGNOSIS — J301 Allergic rhinitis due to pollen: Secondary | ICD-10-CM | POA: Diagnosis not present

## 2015-12-31 ENCOUNTER — Ambulatory Visit (INDEPENDENT_AMBULATORY_CARE_PROVIDER_SITE_OTHER): Payer: Medicare Other | Admitting: Internal Medicine

## 2015-12-31 ENCOUNTER — Encounter: Payer: Self-pay | Admitting: Internal Medicine

## 2015-12-31 VITALS — BP 122/74 | HR 94 | Ht 67.0 in | Wt 287.4 lb

## 2015-12-31 DIAGNOSIS — J8281 Chronic eosinophilic pneumonia: Secondary | ICD-10-CM

## 2015-12-31 DIAGNOSIS — J82 Pulmonary eosinophilia, not elsewhere classified: Secondary | ICD-10-CM

## 2015-12-31 MED ORDER — PREDNISONE 10 MG PO TABS: ORAL_TABLET | ORAL | Status: DC

## 2015-12-31 NOTE — Assessment & Plan Note (Signed)
Chronic eosinophilic pneumonia   -diagnosed by history, symptoms, transbronchial biopsy and bronchoscopy on 09/24/2015   During today's visit patient stated that she has a hx of nasal polyps, rhinosinusitis and has been on BC powder for a number of years.  This information makes adding the differential diagnosis of Churg Stefanie LibelStrauss and Aspirin Exacerbated Respiratory Disease (AERD) to her current list.    Discussed cessation of BC powder and all forms of aspirin with the patient, and re-evaluate in 3 months.  She is in agreement with this plan. In the interm we will consider anti-IgE therapy (Xolair) or Anti-IL5 therapy (Nucala) workup, given her difficult to control asthma and need for chronic steroid.   Plan: -Prednisone 10 mg everyother day until follow up. If symptoms return on low dose, then go back to 10 mg daily prednisone. Avoid any allergens Continue Advair Continue allergy regiment and allergy shot Avoid sick contacts. Stop BC powder and all forms of Aspirin CBC with Diff, IgE level, absolute eosinophil count prior to follow up.

## 2015-12-31 NOTE — Progress Notes (Signed)
MRN# [id number] Rachael Silva [birth year]   CC: Chief Complaint  Patient presents with  . Follow-up    pt  c/o prod cough yellow in color X1w increased prednisone to 10mg  feels cough is improving. denies SOB, wheezing or chest pain/tightness.     synopsis: 70 year old female past medical history of asthma. Being followed by pulmonary for chronic eosinophilic pneumonia, status post bronchoscopy in December 2016 with negative cultures for AFB and fungus. Transbronchial biopsy-positive for eosinophilic infiltrate. Treated with steroids unable to wean since  October 2015.   Brief History:  06/18/14 IOV /Pulmonary consult   patient is a pleasant 70 yo female with PMHx of Asthma, nasal polyps, possible sinusitis, possible Eosinophilic Pneumonia, Arthritis, seen in consultation for chronic cough.   Patient stated that she started have a cough with yellowish sputum since April of 2015, the mucus production has dissipated a little, but the color can vary from grey to yellow at times.   Over the past year she has had recurrent bout of "bronchitis" treated with multiple rounds of antibiotics and steroids; her last round as 8/19, when she received a Zpak and prednisone for 10 days for recurrent cough with productive sputum production.   She states that her bronchitis episodes last about 1 week, then she get antibiotics and steroids, her symptoms (cough, sputum production, sob), improve; however, about 5 days after completing her antibiotics\steroids her symptoms start to reoccur.   She has seen and allergist (Dr. Clelia Croft) for recurrent allergies (dust) and is on OTC allegra.   She has also seen an ENT physician (Dr. Jenne Campus), for sinusitis symptoms, who stopped her nasal steroid possible due to her nasal polyps.   Patient states that she can walk about 1/2 block before getting short of breath, and cannot climb 1 flight of stairs. She is currently on lasix for leg swelling.   Patient has cats at home. She is a  never smoker. 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   Patient has been using her albuterol on a daily basis since July.   07-02-14: presented to [facility] with worsening SOB\DOE, sputum production, noted to have elevated level of IgE, high suspicion for ABPA, started on prednisone 0.5mg /kg/QD (60mg  daily). She had allergy profile testing performed, serum. Rapid improvement per patient since starting steroids.  At today's visit he states that she is rapidly improve, since starting prednisone which was given to her at her last visit to pulmonology [facility]. Patient is currently on 60 mg of prednisone daily, being treated for suspected ABPA (allergic bronchopulmonary aspergillosis). Should also like to review the results of her recent CT chest scan. Today she endorses mild dyspnea on exertion, mild cough which is nonproductive. IgE >2400, started on high dose tapering steroids at 60mg  and schedule for further aspergillus testing  07-15-14   Testing looks like true negative for IgE immediate allergic reaction to aspergillus. Recognize that 60 mg prednisone may mask reaction to unknown degree, although it did not limit reaction to histamine control. Another mold or another hypersensitivity pathway might still cause the syndrome in question.  08-11-14 Patient states that she is doing well today, no complaints of shortness of breath, dyspnea on exertion, productive cough, she does state she has a mild runny nose with clear discharge. Patient is currently on a prednisone taper, she started 40 mg daily  on Friday, October 30. Patient states that since starting steroids she has gained approximately 10-12 pounds.  09-23-14 Patient states that she is doing better, no major complaints of sob, doe.  She does have mild productive cough since switching to prednisone  30mg  every other day, but she believes this is due to the weather causing her allergies to act up. Cough with mild yellowish sputum production, in the mornings. She has lost 8 lbs since last visit Has bone density schedule for Jan 2016.   Plan:steroid taper is as follows: Starting September 28, 2014 prednisone 25 mg daily x2 weeks, then 25 mg every other day x2 weeks then 20 mg daily x2 weeks, then 20 mg every other day x2 weeks, then 15mg  daily x 2weeks, then 15mg  every other day x 2wks  11-27-14   Patient states that she is doing better, no major complaints of sob, doe.  She does have mild productive cough since switching to prednisone 20mg  every other day, but again,she believes this is due to the weather changes causing her allergies to act up. Cough with mild yellowish sputum production, in the mornings, this has not changed since the last visit.   No urgent care\ED visits for cough\sob.   Gained 7lbs since the last visit. Currently on prednisone 20mg  everyother day, scheduled to switch to prednisone 15mg  daily on 11/23/14.   12-29-14 Patient stated that she reduced to prednisone 15 mg every other day, started back having symptoms of cough, sputum production, dyspnea on exertion and wheezing (after the 3rd dose). She increased her dose back to Prednisone 20mg  every other day, and started having resolution of symptoms after the first dose (within 1-2 days).   Currently, with baseline dyspnea, no cough\sputum production. She did state that she has not been using her Advair every day, due to thinking that prednisone was sufficient.   Patient stated that she saw her ENT this morning, had decrease breath sounds, received an albuterol treatment, did not feel much difference.   Plan: prednisone 20mg  everyother day for 3-4 weeks, then 15mg  everyother day until follow up.    ROV 02-02-15 Patient presents for a follow up visit day of ABPA steroid taper. No major symptoms   Down to 15mg  prednisone  everyother day, no worsening of symptoms, accompained by daughter. Using Advair twice a day on a regular basis now.    Plan: Continue with  15mg  (3pills) everyother day until 02/18/15, then 10mg  (2pills) daily for 2 wks, then 10mg  everyother day until follow up. We had discussed bronchoscopy early in the diagnosis of ABPA, however, patient has refused bronchoscopy in the past and decided to forego with steroid treatment and symptomatic management. If we're unable to wean patient from steroids or symptoms return with a steroid reduction dose will need to strongly consider bronchoscopy with BAL for further evaluation of atypical infections, fungal infection. This was discussed with the patient and her daughter at this visit, for both verbalized understanding.  ROV 04/06/15 Patient presents for a follow up visit day of ABPA steroid taper. Mild symptoms of a mild cough  and mild DOE with stir climbing at the end of the day.   Down to 10mg  prednisone everyother day, no worsening of symptoms. Using Advair twice a day on a regular basis now.     Patient states that she is currently off all oral diabetic meds now, she is only on DM monitoring Plan: Currently we are trying to taper her prednisone dose to the lowest possible  effective dose that gives her the greatest clinical outcome. Continue with Prednisone 10mg  (2pills) everyother day until 04/11/15, then 7.5mg  (1.5pills) daily for 2 wks, then 7.5mg  everyother day until follow up   We had discussed bronchoscopy early in the diagnosis of ABPA, however, patient has refused bronchoscopy in the past and decided to forego with steroid treatment and symptomatic management. If we're unable to wean patient from steroids or symptoms return with a steroid reduction dose will need to strongly consider bronchoscopy with BAL for further evaluation of atypical infections, fungal infection. This was discussed with the patient and her daughter at this visit, for both  verbalized understanding.  ROV 05/11/15 - sleep study eval She presents for a sleep evaluation. She complains of snoring.  Symptoms began several months ago, stable since that time. She goes to sleep at 9 weekdays and 10 weekends. She awakens 7 weekdays and 8 weekends. She falls asleep in 10 minutes.  Collar size 18. She denies snoring.   She notes that she does not wake up refreshed, she is often sleepy during the day. She often finds herself nodding off in the afternoon when doing activities such as using a computer or watching television. She has not had any close calls while at the wheel. She is not fallen asleep while driving. Plan: sleep study   ROV 06/07/15 Presents today for a follow up visit of suspected ABPA, was weaned down to 7.5mg  until last Wednesday, started having increased cough with yellow sputum production. She increased her prednisone to 10mg  daily, and her symptoms improved.  Overall with good clinical response to steroids, but difficult and prolonged wean. Saw sleep specialist for OSA eval, has not gotten PSG performed yet.   Plan: Patient wean to 7.5mg  daily, until last Wednesday, when the weather changed (more humid and hot), and her symptoms became worst, necessitating increase in Prednisone back to 10mg  daily.   Currently we are trying to taper her prednisone dose to the lowest possible effective dose that gives her the greatest clinical outcome. Continue with Prednisone 10mg  of Prednisone until 06/09/15, then starting 06/11/15 goto 10mg  prednisone everyother day for 1 month (entire September), then start prednisone 7.5mg  every other day on 07/10/15. We had discussed bronchoscopy early in the diagnosis of ABPA, however, patient has refused bronchoscopy in the past and decided to forego with steroid treatment and symptomatic management. If we're unable to wean patient from steroids or symptoms return with a steroid reduction dose will need to strongly consider bronchoscopy with  BAL for further evaluation of atypical infections, fungal infection.   ROV 09/09/15: She presents today for a follow up visit of CEP/ABPA, also has questions about the bronchoscopy procedure. Currently on 10mg  prednisone daily, which is keep her symptoms well managed. Patient daughter is present during today's visit.  Admits to mild cough, nasal congestion and drainage Plan - bronchoscopy with BAL and TBBx, short abx course, cont with steroid.   ROV 10/2015 Patient presents today for follow-up visit of chronic eosinophilic pneumonia/ABPA,  Status post bronchoscopy. Review of her bronchoscopy cultures showed that she has normal respiratory flora, there is no fungal elements at this time, there is no AFB positive elements at this time. Also since her last visit she had a recurrent upper respiratory tract infection and was treated with antibiotics and increased dose of prednisone 10 mg. Since that event she's been continued on 10 mg of prednisone daily. Plan: continue with prednisone 10 mg daily until 11/08/2014, then start prednisone 7.5 mg  daily until follow-up visit. If symptoms return on low dose, then go back to 10 mg daily prednisone. Avoid any allergens Continue Advair Continue allergy regiment Avoid sick contacts.  Events since last clinic visit: She presents today for follow-up visit of her chronic eosinophilic pneumonia. At her last visit she was weaned down to prednisone 7.5 mg daily. However, over the last 1 week she's had increased productive sputum, greenish in nature, and had to increase her prednisone back to 10 mg daily, which has immediately improved her cough and sputum production. He denies any fever, chills, nausea, vomiting. Discussed the need to wean steroids with the patient during her visit today, we reviewed all her allergy testing her IgE levels, her need for possibly being on an anti-IgE drug or anti-IL 5 drug. We also discussed optimizing her allergy regiment along  with her allergy shots, this discussion led to her following with ENT, Dr. Jenne Campus, who she stated that she started seeing about 10 years ago for nasal polyps. Given this case of information, she was also treated for chronic sinusitis/rhinosinusitis, over the last 10 years. I decided to ask her questions about asthma compliance in addition to her CEP, and found out of the patient has been taking BC powder for leg pain for the last 10 years or so. This prompted a concern for either Churg Strauss syndrome or aspirin insensitivity syndromes.    Medication:   Current Outpatient Rx  Name  Route  Sig  Dispense  Refill  . albuterol (ACCUNEB) 0.63 MG/3ML nebulizer solution   Nebulization   Take 1 ampule by nebulization every 6 (six) hours as needed.           . B Complex-C-Folic Acid (STRESS B COMPLEX PO)   Oral   Take 1 tablet by mouth daily.         . Calcium Carbonate-Vitamin D (CALTRATE 600+D PO)   Oral   Take 1 tablet by mouth 2 (two) times daily.         Marland Kitchen co-enzyme Q-10 30 MG capsule   Oral   Take 200 mg by mouth daily.         Marland Kitchen EPIPEN 2-PAK 0.3 MG/0.3ML SOAJ injection      See admin instructions.      99     Dispense as written.   . fexofenadine (ALLEGRA ALLERGY) 60 MG tablet   Oral   Take 60 mg by mouth daily.          . Fluticasone-Salmeterol (ADVAIR DISKUS) 250-50 MCG/DOSE AEPB   Inhalation   Inhale 1 puff into the lungs 2 (two) times daily.          . furosemide (LASIX) 20 MG tablet      TAKE 1 TABLET (20 MG TOTAL) BY MOUTH DAILY.   30 tablet   6   . GARLIC PO   Oral   Take 1 capsule by mouth daily.         Marland Kitchen glucose blood (ONE TOUCH ULTRA TEST) test strip      USE TO TEST TWICE DAILY Dx E09.9   50 each   11   . ibuprofen (ADVIL,MOTRIN) 800 MG tablet      TAKE 1 TABLET BY MOUTH EVERY 8 HOURS AS NEEDED FOR JOINT PAIN. TAKE WITH FOOD, STOP IT STOMACH UPSET   30 tablet   11   . levothyroxine (SYNTHROID, LEVOTHROID) 50 MCG tablet      TAKE  1 TABLET BY MOUTH DAILY  30 tablet   6   . montelukast (SINGULAIR) 10 MG tablet   Oral   Take 10 mg by mouth daily.           . Multiple Vitamins-Minerals (CENTRUM ADULTS PO)   Oral   Take 1 tablet by mouth daily.         Marland Kitchen omeprazole (PRILOSEC) 40 MG capsule   Oral   Take 1 capsule (40 mg total) by mouth daily. Patient taking differently: Take 20 mg by mouth daily.    30 capsule   5   . ONETOUCH DELICA LANCETS 33G MISC      TEST SUGARS TWICE DAILY Dx E09.9   100 each   3   . predniSONE (DELTASONE) 10 MG tablet      TAKE 10 MG EVERY OTHER DAY UNTIL FOLLOW UP. IF SYMPTOMS RETURN THEN GO BACK TO 10 MG DAILY UNTIL FOLLOW UP   30 tablet   2   . VENTOLIN HFA 108 (90 Base) MCG/ACT inhaler      TAKE 1 TO 2 PUFFS EVERY 6 HOURS AS NEEDED   18 g   11      Review of Systems: Gen:  Denies  fever, sweats, chills HEENT: Denies blurred vision, double vision, ear pain, eye pain, hearing loss, nose bleeds, sore throat Cvc:  No dizziness, chest pain or heaviness Resp:   Admits UJ:WJXBJ, DOE (stable), nasal congestion Gi: Denies swallowing difficulty, stomach pain, nausea or vomiting, diarrhea, constipation, bowel incontinence Gu:  Denies bladder incontinence, burning urine Ext:   No Joint pain, stiffness or swelling Skin: No skin rash, easy bruising or bleeding or hives Endoc:  No polyuria, polydipsia , polyphagia or weight change Other:  All other systems negative  Allergies:  Erythromycin  Physical Examination:  VS: BP 122/74 mmHg  Pulse 94  Ht 5\' 7"  (1.702 m)  Wt 287 lb 6.4 oz (130.364 kg)  BMI 45.00 kg/m2  SpO2 95%  General Appearance: No distress  HEENT: PERRLA, no ptosis, no other lesions noticed Pulmonary:normal breath sounds., diaphragmatic excursion normal.No wheezing, No rales   Cardiovascular:  Normal S1,S2.  No m/r/g.     Abdomen:Exam: Benign, Soft, non-tender, No masses  Skin:   warm, no rashes, no ecchymosis  Extremities: normal, no cyanosis,  clubbing, warm with normal capillary refill.     CT Chest 08/23/15 COMPARISON: Chest CT, 06/23/2014  FINDINGS: Thoracic inlet: No mass or adenopathy. Thyroid is unremarkable.  Mediastinum and hila: Heart normal in size and configuration. There is a focus of calcification within the left ventricle, unchanged. Minor coronary artery calcifications. Great vessels are normal in caliber. Several mildly prominent mediastinal lymph nodes. Largest is a right peritracheal node measuring 11 mm in short axis, previously 14 mm. Other lymph nodes are either smaller or stable from the prior CT. No hilar masses or adenopathy.  Lungs and pleura: No evidence of pneumonia or edema. No mass or suspicious nodule.  Primarily linear and discoid type opacity is noted in the anteromedial right middle lobe, left upper lobe lingula and lower lobes, right greater than left, similar to the prior study, consistent with chronic atelectasis/ scarring.  No pleural effusion. No pneumothorax.  Limited upper abdomen: Fat containing Bochdalek's hernia on the right. Otherwise unremarkable.  Musculoskeletal: Degenerative changes noted along the thoracic spine. No osteoblastic or osteolytic lesions.  IMPRESSION: 1. No acute findings. 2. Areas of lung opacity noted in the left upper lobe lingula, right middle lobe and both lower lobes is similar  to the prior study, most consistent with scarring and/or chronic atelectasis. No evidence of pneumonia or edema. No mass or suspicious nodule. 3. Mildly prominent mediastinal lymph nodes, which are either stable or decreased in size from the prior CT, consistent with reactive nodes.   Assessment and Plan: 70 year old female with chronic cough, recurrent URIs, being treated as ABPA/chronic eosinophilic pneumonia, unable to wean her steroids, concerns now for possible AERD/Churg Stefanie Libel Syndrome given chronic ASA use (in [facility] powder packets).  EP (eosinophilic  pneumonia) (HCC)  Chronic eosinophilic pneumonia   -diagnosed by history, symptoms, transbronchial biopsy and bronchoscopy on 09/24/2015   During today's visit patient stated that she has a hx of nasal polyps, rhinosinusitis and has been on [facility] powder for a number of years.  This information makes adding the differential diagnosis of Churg Stefanie Libel and Aspirin Exacerbated Respiratory Disease (AERD) to her current list.    Discussed cessation of BC powder and all forms of aspirin with the patient, and re-evaluate in 3 months.  She is in agreement with this plan. In the interm we will consider anti-IgE therapy (Xolair) or Anti-IL5 therapy (Nucala) workup, given her difficult to control asthma and need for chronic steroid.   Plan: -Prednisone 10 mg everyother day until follow up. If symptoms return on low dose, then go back to 10 mg daily prednisone. Avoid any allergens Continue Advair Continue allergy regiment and allergy shot Avoid sick contacts. Stop BC powder and all forms of Aspirin CBC with Diff, IgE level, absolute eosinophil count prior to follow up.     Updated Medication List Outpatient Encounter Prescriptions as of 12/31/2015  Medication Sig  . albuterol (ACCUNEB) 0.63 MG/3ML nebulizer solution Take 1 ampule by nebulization every 6 (six) hours as needed.    . B Complex-C-Folic Acid (STRESS B COMPLEX PO) Take 1 tablet by mouth daily.  . Calcium Carbonate-Vitamin D (CALTRATE 600+D PO) Take 1 tablet by mouth 2 (two) times daily.  Marland Kitchen co-enzyme Q-10 30 MG capsule Take 200 mg by mouth daily.  Marland Kitchen EPIPEN 2-PAK 0.3 MG/0.3ML SOAJ injection See admin instructions.  . fexofenadine (ALLEGRA ALLERGY) 60 MG tablet Take 60 mg by mouth daily.   . Fluticasone-Salmeterol (ADVAIR DISKUS) 250-50 MCG/DOSE AEPB Inhale 1 puff into the lungs 2 (two) times daily.   . furosemide (LASIX) 20 MG tablet TAKE 1 TABLET (20 MG TOTAL) BY MOUTH DAILY.  Marland Kitchen GARLIC PO Take 1 capsule by mouth daily.  Marland Kitchen glucose blood (ONE  TOUCH ULTRA TEST) test strip USE TO TEST TWICE DAILY Dx E09.9  . ibuprofen (ADVIL,MOTRIN) 800 MG tablet TAKE 1 TABLET BY MOUTH EVERY 8 HOURS AS NEEDED FOR JOINT PAIN. TAKE WITH FOOD, STOP IT STOMACH UPSET  . levothyroxine (SYNTHROID, LEVOTHROID) 50 MCG tablet TAKE 1 TABLET BY MOUTH DAILY  . montelukast (SINGULAIR) 10 MG tablet Take 10 mg by mouth daily.    . Multiple Vitamins-Minerals (CENTRUM ADULTS PO) Take 1 tablet by mouth daily.  Marland Kitchen omeprazole (PRILOSEC) 40 MG capsule Take 1 capsule (40 mg total) by mouth daily. (Patient taking differently: Take 20 mg by mouth daily. )  . ONETOUCH DELICA LANCETS 33G MISC TEST SUGARS TWICE DAILY Dx E09.9  . predniSONE (DELTASONE) 10 MG tablet TAKE 10 MG EVERY OTHER DAY UNTIL FOLLOW UP. IF SYMPTOMS RETURN THEN GO BACK TO 10 MG DAILY UNTIL FOLLOW UP  . VENTOLIN HFA 108 (90 Base) MCG/ACT inhaler TAKE 1 TO 2 PUFFS EVERY 6 HOURS AS NEEDED  . [DISCONTINUED] predniSONE (DELTASONE) 10 MG tablet  TAKE 1 TABLET (10 MG TOTAL) BY MOUTH DAILY WITH BREAKFAST.  . [DISCONTINUED] predniSONE (DELTASONE) 5 MG tablet Take 1.5 tablets (7.5 mg total) by mouth daily with breakfast. (Patient not taking: Reported on 12/31/2015)   No facility-administered encounter medications on file as of 12/31/2015.    Orders for this visit: Orders Placed This Encounter  Procedures  . CBC w/Diff    Standing Status: Future     Number of Occurrences:      Standing Expiration Date: 12/30/2016  . Eosinophil count    Standing Status: Future     Number of Occurrences:      Standing Expiration Date: 12/30/2016  . IgE    Standing Status: Future     Number of Occurrences:      Standing Expiration Date: 03/20/2016    Thank  you for the visitation and for allowing  [facility] to assist in the care of your patient. Our recommendations are noted above.  Please contact us if we can be of further service.  Stephanie Acre, MD [facility] Office Number: [phone]

## 2015-12-31 NOTE — Patient Instructions (Addendum)
Follow up with Dr. Dema SeverinMungal in:3 months - prednisone 10mg  everyother day until follow up.  If symptoms return then we go back to prednisone 10mg  daily until follow up  - cont with allergy shot  - we will check labs prior to your next visit - CBC with diff, IgE level , absolute eosinophil count - cont with inhalers - cont with allergy avoidance and continue with allergy shots.  - diet and exercise.  - stop BC powder and all forms of aspirin

## 2016-01-06 DIAGNOSIS — J301 Allergic rhinitis due to pollen: Secondary | ICD-10-CM | POA: Diagnosis not present

## 2016-01-13 DIAGNOSIS — J301 Allergic rhinitis due to pollen: Secondary | ICD-10-CM | POA: Diagnosis not present

## 2016-01-17 DIAGNOSIS — J301 Allergic rhinitis due to pollen: Secondary | ICD-10-CM | POA: Diagnosis not present

## 2016-01-27 DIAGNOSIS — J301 Allergic rhinitis due to pollen: Secondary | ICD-10-CM | POA: Diagnosis not present

## 2016-01-31 ENCOUNTER — Telehealth: Payer: Self-pay | Admitting: Internal Medicine

## 2016-01-31 DIAGNOSIS — R0789 Other chest pain: Secondary | ICD-10-CM

## 2016-01-31 DIAGNOSIS — R05 Cough: Secondary | ICD-10-CM

## 2016-01-31 DIAGNOSIS — R059 Cough, unspecified: Secondary | ICD-10-CM

## 2016-01-31 NOTE — Telephone Encounter (Signed)
Decreased Prednisone as prescribed to 10mg  every other day, pt started coughing approx 2 weeks ago and now has yellow mucus, green previously. On 12th went back up to 10mg  daily Prednisone, doing breathing treatments. Chest tightness at times. No c/o fever. Please advise.

## 2016-01-31 NOTE — Telephone Encounter (Signed)
Pt informed of response. Nothing further needed. 

## 2016-01-31 NOTE — Telephone Encounter (Signed)
LM on VM pt to call back.

## 2016-01-31 NOTE — Telephone Encounter (Signed)
Cont with prednisone 10mg  daily Obtain CXR - 2 view, for cough and chest tightness - albuterol inhaler - 2puff every 6 hours for 3 days, then 2puff as needed every 3-4 hours as needed for shortness of breath\wheezing\recurrent cough  -VM

## 2016-02-02 ENCOUNTER — Ambulatory Visit
Admission: RE | Admit: 2016-02-02 | Discharge: 2016-02-02 | Disposition: A | Discharge status: Home / Self Care | Payer: Medicare Other | Source: Ambulatory Visit | Attending: Internal Medicine | Admitting: Internal Medicine

## 2016-02-02 DIAGNOSIS — R05 Cough: Secondary | ICD-10-CM | POA: Insufficient documentation

## 2016-02-02 DIAGNOSIS — J301 Allergic rhinitis due to pollen: Secondary | ICD-10-CM | POA: Diagnosis not present

## 2016-02-02 DIAGNOSIS — R0789 Other chest pain: Secondary | ICD-10-CM | POA: Insufficient documentation

## 2016-02-02 DIAGNOSIS — R059 Cough, unspecified: Secondary | ICD-10-CM

## 2016-02-03 ENCOUNTER — Telehealth: Payer: Self-pay | Admitting: *Deleted

## 2016-02-03 NOTE — Telephone Encounter (Signed)
-----   Message from Stephanie AcreVishal Mungal, MD sent at 02/02/2016  3:15 PM EDT ----- Regarding: CXR results Please inform patient that her CXR is normal, no finding of Bronchitis or pneumonia.  Thanks VM

## 2016-02-03 NOTE — Telephone Encounter (Signed)
Pt informed of results. Nothing further needed. 

## 2016-02-14 DIAGNOSIS — J301 Allergic rhinitis due to pollen: Secondary | ICD-10-CM | POA: Diagnosis not present

## 2016-02-24 DIAGNOSIS — J301 Allergic rhinitis due to pollen: Secondary | ICD-10-CM | POA: Diagnosis not present

## 2016-02-25 ENCOUNTER — Other Ambulatory Visit: Payer: Self-pay | Admitting: Family Medicine

## 2016-02-25 DIAGNOSIS — J301 Allergic rhinitis due to pollen: Secondary | ICD-10-CM | POA: Diagnosis not present

## 2016-02-25 NOTE — Telephone Encounter (Signed)
Pt has CPE scheduled on 05/17/16, last refilled on 05/11/14 #30 with 11 additional refills, please advise

## 2016-02-25 NOTE — Telephone Encounter (Signed)
Please refill times 5 

## 2016-03-03 ENCOUNTER — Telehealth: Payer: Self-pay | Admitting: Internal Medicine

## 2016-03-03 NOTE — Telephone Encounter (Signed)
lmov to r/s appt as provider schedule has changed.

## 2016-03-08 DIAGNOSIS — J301 Allergic rhinitis due to pollen: Secondary | ICD-10-CM | POA: Diagnosis not present

## 2016-03-15 DIAGNOSIS — J301 Allergic rhinitis due to pollen: Secondary | ICD-10-CM | POA: Diagnosis not present

## 2016-03-23 DIAGNOSIS — J301 Allergic rhinitis due to pollen: Secondary | ICD-10-CM | POA: Diagnosis not present

## 2016-03-30 ENCOUNTER — Other Ambulatory Visit
Admission: RE | Admit: 2016-03-30 | Discharge: 2016-03-30 | Disposition: A | Discharge status: Home / Self Care | Payer: Medicare Other | Source: Ambulatory Visit | Attending: Internal Medicine | Admitting: Internal Medicine

## 2016-03-30 ENCOUNTER — Telehealth: Payer: Self-pay | Admitting: *Deleted

## 2016-03-30 DIAGNOSIS — J82 Pulmonary eosinophilia, not elsewhere classified: Secondary | ICD-10-CM | POA: Diagnosis not present

## 2016-03-30 DIAGNOSIS — J8281 Chronic eosinophilic pneumonia: Secondary | ICD-10-CM

## 2016-03-30 DIAGNOSIS — J301 Allergic rhinitis due to pollen: Secondary | ICD-10-CM | POA: Diagnosis not present

## 2016-03-30 LAB — CBC WITH DIFFERENTIAL/PLATELET
BASOS ABS: 0.1 10*3/uL (ref 0–0.1)
Basophils Relative: 1 %
EOS PCT: 5 %
Eosinophils Absolute: 0.4 10*3/uL (ref 0–0.7)
HEMATOCRIT: 43.7 % (ref 35.0–47.0)
Hemoglobin: 14.7 g/dL (ref 12.0–16.0)
LYMPHS PCT: 20 %
Lymphs Abs: 1.5 10*3/uL (ref 1.0–3.6)
MCH: 30.1 pg (ref 26.0–34.0)
MCHC: 33.7 g/dL (ref 32.0–36.0)
MCV: 89.2 fL (ref 80.0–100.0)
Monocytes Absolute: 0.3 10*3/uL (ref 0.2–0.9)
Monocytes Relative: 3 %
NEUTROS ABS: 5.3 10*3/uL (ref 1.4–6.5)
Neutrophils Relative %: 71 %
PLATELETS: 197 10*3/uL (ref 150–440)
RBC: 4.9 MIL/uL (ref 3.80–5.20)
RDW: 13.5 % (ref 11.5–14.5)
WBC: 7.4 10*3/uL (ref 3.6–11.0)

## 2016-03-30 NOTE — Telephone Encounter (Signed)
Had to change resulting agency.

## 2016-03-31 LAB — IGE: IgE (Immunoglobulin E), Serum: 1180 IU/mL — ABNORMAL HIGH (ref 0–100)

## 2016-04-03 ENCOUNTER — Ambulatory Visit: Payer: Medicare Other | Admitting: Internal Medicine

## 2016-04-05 ENCOUNTER — Encounter: Payer: Self-pay | Admitting: Internal Medicine

## 2016-04-05 ENCOUNTER — Ambulatory Visit (INDEPENDENT_AMBULATORY_CARE_PROVIDER_SITE_OTHER): Payer: Medicare Other | Admitting: Internal Medicine

## 2016-04-05 VITALS — BP 142/88 | HR 97 | Wt 272.0 lb

## 2016-04-05 DIAGNOSIS — J4540 Moderate persistent asthma, uncomplicated: Secondary | ICD-10-CM | POA: Diagnosis not present

## 2016-04-05 DIAGNOSIS — J82 Pulmonary eosinophilia, not elsewhere classified: Secondary | ICD-10-CM | POA: Diagnosis not present

## 2016-04-05 DIAGNOSIS — J209 Acute bronchitis, unspecified: Secondary | ICD-10-CM | POA: Diagnosis not present

## 2016-04-05 DIAGNOSIS — J8281 Chronic eosinophilic pneumonia: Secondary | ICD-10-CM

## 2016-04-05 MED ORDER — AZITHROMYCIN 250 MG PO TABS: ORAL_TABLET | ORAL | Status: AC

## 2016-04-05 MED ORDER — PREDNISONE 5 MG PO TABS: 12.5000 mg | ORAL_TABLET | Freq: Every day | ORAL | Status: DC

## 2016-04-05 NOTE — Progress Notes (Signed)
MRN# [id number] Rachael Silva May 25, 1946   CC: Chief Complaint  Patient presents with  . Follow-up    SOB w/activity; prod cough; congested feeling; chest tightness;    synopsis: 70 year old female past medical history of asthma. Being followed by pulmonary for chronic eosinophilic pneumonia, status post bronchoscopy in December 2016 with negative cultures for AFB and fungus. Transbronchial biopsy-positive for eosinophilic infiltrate. Treated with steroids unable to wean since  October 2015.   Brief History:  06/18/14 IOV /Pulmonary consult   patient is a pleasant 70 yo female with PMHx of Asthma, nasal polyps, possible sinusitis, possible Eosinophilic Pneumonia, Arthritis, seen in consultation for chronic cough.   Patient stated that she started have a cough with yellowish sputum since April of 2015, the mucus production has dissipated a little, but the color can vary from grey to yellow at times.   Over the past year she has had recurrent bout of "bronchitis" treated with multiple rounds of antibiotics and steroids; her last round as 8/19, when she received a Zpak and prednisone for 10 days for recurrent cough with productive sputum production.   She states that her bronchitis episodes last about 1 week, then she get antibiotics and steroids, her symptoms (cough, sputum production, sob), improve; however, about 5 days after completing her antibiotics\steroids her symptoms start to reoccur.   She has seen and allergist (Dr. Clelia Croft) for recurrent allergies (dust) and is on OTC allegra.   She has also seen an ENT physician (Dr. Jenne Campus), for sinusitis symptoms, who stopped her nasal steroid possible due to her nasal polyps.   Patient states that she can walk about 1/2 block before getting short of breath, and cannot climb 1 flight of stairs. She is currently on lasix for leg swelling.   Patient has cats at home. She is a never smoker. 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   Patient has been using her albuterol on a daily basis since July.   07-02-14: presented to [facility] with worsening SOB\DOE, sputum production, noted to have elevated level of IgE, high suspicion for ABPA, started on prednisone 0.5mg /kg/QD (60mg  daily). She had allergy profile testing performed, serum. Rapid improvement per patient since starting steroids.  At today's visit he states that she is rapidly improve, since starting prednisone which was given to her at her last visit to pulmonology [facility]. Patient is currently on 60 mg of prednisone daily, being treated for suspected ABPA (allergic bronchopulmonary aspergillosis). Should also like to review the results of her recent CT chest scan. Today she endorses mild dyspnea on exertion, mild cough which is nonproductive. IgE >2400, started on high dose tapering steroids at 60mg  and schedule for further aspergillus testing  07-15-14   Testing looks like true negative for IgE immediate allergic reaction to aspergillus. Recognize that 60 mg prednisone may mask reaction to unknown degree, although it did not limit reaction to histamine control. Another mold or another hypersensitivity pathway might still cause the syndrome in question.  08-11-14 Patient states that she is doing well today, no complaints of shortness of breath, dyspnea on exertion, productive cough, she does state she has a mild runny nose with clear discharge. Patient is currently on a prednisone taper, she started 40 mg daily on Friday, October 30. Patient states that since starting steroids she has gained approximately 10-12 pounds.  09-23-14 Patient states that she is doing better, no major complaints of sob, doe.  She does have mild productive cough since switching to prednisone 30mg  every other day, but she believes this is due to the weather causing her  allergies to act up. Cough with mild yellowish sputum production, in the mornings. She has lost 8 lbs since last visit Has bone density schedule for Jan 2016.   Plan:steroid taper is as follows: Starting September 28, 2014 prednisone 25 mg daily x2 weeks, then 25 mg every other day x2 weeks then 20 mg daily x2 weeks, then 20 mg every other day x2 weeks, then 15mg  daily x 2weeks, then 15mg  every other day x 2wks  11-27-14   Patient states that she is doing better, no major complaints of sob, doe.  She does have mild productive cough since switching to prednisone 20mg  every other day, but again,she believes this is due to the weather changes causing her allergies to act up. Cough with mild yellowish sputum production, in the mornings, this has not changed since the last visit.   No urgent care\ED visits for cough\sob.   Gained 7lbs since the last visit. Currently on prednisone 20mg  everyother day, scheduled to switch to prednisone 15mg  daily on 11/23/14.   12-29-14 Patient stated that she reduced to prednisone 15 mg every other day, started back having symptoms of cough, sputum production, dyspnea on exertion and wheezing (after the 3rd dose). She increased her dose back to Prednisone 20mg  every other day, and started having resolution of symptoms after the first dose (within 1-2 days).   Currently, with baseline dyspnea, no cough\sputum production. She did state that she has not been using her Advair every day, due to thinking that prednisone was sufficient.   Patient stated that she saw her ENT this morning, had decrease breath sounds, received an albuterol treatment, did not feel much difference.   Plan: prednisone 20mg  everyother day for 3-4 weeks, then 15mg  everyother day until follow up.    ROV 02-02-15 Patient presents for a follow up visit day of ABPA steroid taper. No major symptoms   Down to 15mg  prednisone everyother day, no worsening of symptoms, accompained by daughter. Using Advair  twice a day on a regular basis now.    Plan: Continue with  15mg  (3pills) everyother day until 02/18/15, then 10mg  (2pills) daily for 2 wks, then 10mg  everyother day until follow up. We had discussed bronchoscopy early in the diagnosis of ABPA, however, patient has refused bronchoscopy in the past and decided to forego with steroid treatment and symptomatic management. If we're unable to wean patient from steroids or symptoms return with a steroid reduction dose will need to strongly consider bronchoscopy with BAL for further evaluation of atypical infections, fungal infection. This was discussed with the patient and her daughter at this visit, for both verbalized understanding.  ROV 04/06/15 Patient presents for a follow up visit day of ABPA steroid taper. Mild symptoms of a mild cough  and mild DOE with stir climbing at the end of the day.   Down to 10mg  prednisone everyother day, no worsening of symptoms. Using Advair twice a day on a regular basis now.     Patient states that she is currently off all oral diabetic meds now, she is only on DM monitoring Plan: Currently we are trying to taper her prednisone dose to the lowest possible effective dose that gives her the greatest clinical outcome. Continue with Prednisone 10mg  (2pills) everyother day until  04/11/15, then 7.5mg  (1.5pills) daily for 2 wks, then 7.5mg  everyother day until follow up   We had discussed bronchoscopy early in the diagnosis of ABPA, however, patient has refused bronchoscopy in the past and decided to forego with steroid treatment and symptomatic management. If we're unable to wean patient from steroids or symptoms return with a steroid reduction dose will need to strongly consider bronchoscopy with BAL for further evaluation of atypical infections, fungal infection. This was discussed with the patient and her daughter at this visit, for both verbalized understanding.  ROV 05/11/15 - sleep study eval She presents for a sleep  evaluation. She complains of snoring.  Symptoms began several months ago, stable since that time. She goes to sleep at 9 weekdays and 10 weekends. She awakens 7 weekdays and 8 weekends. She falls asleep in 10 minutes.  Collar size 18. She denies snoring.   She notes that she does not wake up refreshed, she is often sleepy during the day. She often finds herself nodding off in the afternoon when doing activities such as using a computer or watching television. She has not had any close calls while at the wheel. She is not fallen asleep while driving. Plan: sleep study   ROV 06/07/15 Presents today for a follow up visit of suspected ABPA, was weaned down to 7.5mg  until last Wednesday, started having increased cough with yellow sputum production. She increased her prednisone to 10mg  daily, and her symptoms improved.  Overall with good clinical response to steroids, but difficult and prolonged wean. Saw sleep specialist for OSA eval, has not gotten PSG performed yet.   Plan: Patient wean to 7.5mg  daily, until last Wednesday, when the weather changed (more humid and hot), and her symptoms became worst, necessitating increase in Prednisone back to 10mg  daily.   Currently we are trying to taper her prednisone dose to the lowest possible effective dose that gives her the greatest clinical outcome. Continue with Prednisone 10mg  of Prednisone until 06/09/15, then starting 06/11/15 goto 10mg  prednisone everyother day for 1 month (entire September), then start prednisone 7.5mg  every other day on 07/10/15. We had discussed bronchoscopy early in the diagnosis of ABPA, however, patient has refused bronchoscopy in the past and decided to forego with steroid treatment and symptomatic management. If we're unable to wean patient from steroids or symptoms return with a steroid reduction dose will need to strongly consider bronchoscopy with BAL for further evaluation of atypical infections, fungal infection.   ROV  09/09/15: She presents today for a follow up visit of CEP/ABPA, also has questions about the bronchoscopy procedure. Currently on 10mg  prednisone daily, which is keep her symptoms well managed. Patient daughter is present during today's visit.  Admits to mild cough, nasal congestion and drainage Plan - bronchoscopy with BAL and TBBx, short abx course, cont with steroid.   ROV 10/2015 Patient presents today for follow-up visit of chronic eosinophilic pneumonia/ABPA,  Status post bronchoscopy. Review of her bronchoscopy cultures showed that she has normal respiratory flora, there is no fungal elements at this time, there is no AFB positive elements at this time. Also since her last visit she had a recurrent upper respiratory tract infection and was treated with antibiotics and increased dose of prednisone 10 mg. Since that event she's been continued on 10 mg of prednisone daily. Plan: continue with prednisone 10 mg daily until 11/08/2014, then start prednisone 7.5 mg daily until follow-up visit. If symptoms return on low dose, then go back to 10 mg daily  prednisone. Avoid any allergens Continue Advair Continue allergy regiment Avoid sick contacts.  Events since last clinic visit: Patient presents today for follow-up visit of her ABPA, asthma, chronic prednisone use secondary to lung disease. Since her last visit she has weaned down his steroids to 10 mg of prednisone daily, she has stop using BC powder, but she hasn't supplementing with ibuprofen. She states that she has had increased nasal congestion especially worse over the last 2-3 weeks, and productive cough with greenish to yellow sputum. Overall no significant change in her clinical status since her last visit, still unable to wean steroids down to a suitable level or even wean steroids off. -Denies any fever, chills, weight loss. Admits to cough, productive sputum, nasal congestion, sinus congestion, change in color  sputum.   Medication:   Current Outpatient Rx  Name  Route  Sig  Dispense  Refill  . albuterol (ACCUNEB) 0.63 MG/3ML nebulizer solution   Nebulization   Take 1 ampule by nebulization every 6 (six) hours as needed.           . B Complex-C-Folic Acid (STRESS B COMPLEX PO)   Oral   Take 1 tablet by mouth daily.         . Calcium Carbonate-Vitamin D (CALTRATE 600+D PO)   Oral   Take 1 tablet by mouth 2 (two) times daily.         Marland Kitchen co-enzyme Q-10 30 MG capsule   Oral   Take 200 mg by mouth daily.         Marland Kitchen EPIPEN 2-PAK 0.3 MG/0.3ML SOAJ injection      See admin instructions.      99     Dispense as written.   . fexofenadine (ALLEGRA ALLERGY) 60 MG tablet   Oral   Take 60 mg by mouth daily.          . Fluticasone-Salmeterol (ADVAIR DISKUS) 250-50 MCG/DOSE AEPB   Inhalation   Inhale 1 puff into the lungs 2 (two) times daily.          . furosemide (LASIX) 20 MG tablet      TAKE 1 TABLET (20 MG TOTAL) BY MOUTH DAILY.   30 tablet   6   . GARLIC PO   Oral   Take 1 capsule by mouth daily.         Marland Kitchen glucose blood (ONE TOUCH ULTRA TEST) test strip      USE TO TEST TWICE DAILY Dx E09.9   50 each   11   . ibuprofen (ADVIL,MOTRIN) 800 MG tablet      TAKE 1 TABLET BY MOUTH EVERY 8 HOURS AS NEEDED FOR JOINT PAIN. TAKE WITH FOOD, STOP IT STOMACH UPSET   30 tablet   5   . levothyroxine (SYNTHROID, LEVOTHROID) 50 MCG tablet      TAKE 1 TABLET BY MOUTH DAILY   30 tablet   6   . montelukast (SINGULAIR) 10 MG tablet   Oral   Take 10 mg by mouth daily.           . Multiple Vitamins-Minerals (CENTRUM ADULTS PO)   Oral   Take 1 tablet by mouth daily.         Marland Kitchen omeprazole (PRILOSEC) 40 MG capsule   Oral   Take 1 capsule (40 mg total) by mouth daily. Patient taking differently: Take 20 mg by mouth daily.    30 capsule   5   . ONETOUCH DELICA LANCETS 33G MISC  TEST SUGARS TWICE DAILY Dx E09.9   100 each   3   . predniSONE (DELTASONE) 10 MG  tablet      TAKE 10 MG EVERY OTHER DAY UNTIL FOLLOW UP. IF SYMPTOMS RETURN THEN GO BACK TO 10 MG DAILY UNTIL FOLLOW UP   30 tablet   2   . VENTOLIN HFA 108 (90 Base) MCG/ACT inhaler      TAKE 1 TO 2 PUFFS EVERY 6 HOURS AS NEEDED   18 g   11      Review of Systems: Gen:  Denies  fever, sweats, chills HEENT: Denies blurred vision, double vision, ear pain, eye pain, hearing loss, nose bleeds, sore throat Cvc:  No dizziness, chest pain or heaviness Resp:   Admits ZO:XWRUE, DOE (stable), nasal congestion Gi: Denies swallowing difficulty, stomach pain, nausea or vomiting, diarrhea, constipation, bowel incontinence Gu:  Denies bladder incontinence, burning urine Ext:   No Joint pain, stiffness or swelling Skin: No skin rash, easy bruising or bleeding or hives Endoc:  No polyuria, polydipsia , polyphagia or weight change Other:  All other systems negative  Allergies:  Erythromycin  Physical Examination:  VS: BP 142/88 mmHg  Pulse 97  Wt 272 lb (123.378 kg)  SpO2 90%  General Appearance: No distress  HEENT: PERRLA, no ptosis, no other lesions noticed Pulmonary:normal breath sounds., diaphragmatic excursion normal.No wheezing, No rales   Cardiovascular:  Normal S1,S2.  No m/r/g.     Abdomen:Exam: Benign, Soft, non-tender, No masses  Skin:   warm, no rashes, no ecchymosis  Extremities: normal, no cyanosis, clubbing, warm with normal capillary refill.     (The following images and results were reviewed by Dr. Dema Severin on 04/05/2016). CXR 02/02/16 CLINICAL DATA: Productive cough for several weeks  EXAM: CHEST 2 VIEW  COMPARISON: None.  FINDINGS: Cardiac shadow is within normal limits. The lungs are mildly hyperinflated. No focal infiltrate or sizable effusion is seen. No acute bony abnormality is noted.  IMPRESSION: No active cardiopulmonary disease.  LABS Absolute Eosinophil count - 0.1  Assessment and Plan: 70 year old female with chronic cough, recurrent URIs,  being treated as ABPA/chronic eosinophilic pneumonia, unable to wean her steroids, now with an acute exacerbation of her ABPA  Acute bronchitis with bronchospasm Patient with worsening sputum production and characteristics today Will treat as acute bronchitis   Plan - prednisone 12.5 mg daily until follow up visit - Zpak - CT Chest - Sputum culture  Asthma, chronic Recurrent exacerbation during the first half of 2015 with elevated serum eosinophils and abnormal CT  Aspergillus IgE panel neg w/ low positive titers for Asp Nidulans, Flavus and Terreus and amstel.  She has an elevated total IgE level, CT findings with mild bronchiectasis, groundglass opacities-given her history of asthma and his current lab and radiographic abnormalities there is a high suspicion for ABPA. Currently being treated as ABPA, with steroid only.   Currently we are trying to taper her prednisone dose to the lowest possible effective dose that gives her the greatest clinical outcome. Patient got down to 7.5 mg every other day at one point, but is still having moderate amount of symptoms of cough, runny nose, sputum production, and shortness of breath. At today's visit she is having acute on chronic bronchitis, and we had to increase her prednisone to 12.5 mg daily  Absolute eosinophil = 0.1  Plan - Continue Advair as directed  Plan - prednisone 12.5 mg daily until follow up visit - Zpak - CT Chest w/o contrast -  Sputum culture               Updated Medication List Outpatient Encounter Prescriptions as of 04/05/2016  Medication Sig  . albuterol (ACCUNEB) 0.63 MG/3ML nebulizer solution Take 1 ampule by nebulization every 6 (six) hours as needed.    . B Complex-C-Folic Acid (STRESS B COMPLEX PO) Take 1 tablet by mouth daily.  . Calcium Carbonate-Vitamin D (CALTRATE 600+D PO) Take 1 tablet by mouth 2 (two) times daily.  Marland Kitchen co-enzyme Q-10 30 MG capsule Take 200 mg by mouth daily.  Marland Kitchen EPIPEN 2-PAK 0.3  MG/0.3ML SOAJ injection See admin instructions.  . fexofenadine (ALLEGRA ALLERGY) 60 MG tablet Take 60 mg by mouth daily.   . Fluticasone-Salmeterol (ADVAIR DISKUS) 250-50 MCG/DOSE AEPB Inhale 1 puff into the lungs 2 (two) times daily.   . furosemide (LASIX) 20 MG tablet TAKE 1 TABLET (20 MG TOTAL) BY MOUTH DAILY.  Marland Kitchen GARLIC PO Take 1 capsule by mouth daily.  Marland Kitchen glucose blood (ONE TOUCH ULTRA TEST) test strip USE TO TEST TWICE DAILY Dx E09.9  . ibuprofen (ADVIL,MOTRIN) 800 MG tablet TAKE 1 TABLET BY MOUTH EVERY 8 HOURS AS NEEDED FOR JOINT PAIN. TAKE WITH FOOD, STOP IT STOMACH UPSET  . levothyroxine (SYNTHROID, LEVOTHROID) 50 MCG tablet TAKE 1 TABLET BY MOUTH DAILY  . montelukast (SINGULAIR) 10 MG tablet Take 10 mg by mouth daily.    . Multiple Vitamins-Minerals (CENTRUM ADULTS PO) Take 1 tablet by mouth daily.  Marland Kitchen omeprazole (PRILOSEC) 40 MG capsule Take 1 capsule (40 mg total) by mouth daily. (Patient taking differently: Take 20 mg by mouth daily. )  . ONETOUCH DELICA LANCETS 33G MISC TEST SUGARS TWICE DAILY Dx E09.9  . predniSONE (DELTASONE) 10 MG tablet TAKE 10 MG EVERY OTHER DAY UNTIL FOLLOW UP. IF SYMPTOMS RETURN THEN GO BACK TO 10 MG DAILY UNTIL FOLLOW UP  . VENTOLIN HFA 108 (90 Base) MCG/ACT inhaler TAKE 1 TO 2 PUFFS EVERY 6 HOURS AS NEEDED   No facility-administered encounter medications on file as of 04/05/2016.    Orders for this visit: No orders of the defined types were placed in this encounter.    Thank  you for the visitation and for allowing  [facility] to assist in the care of your patient. Our recommendations are noted above.  Please contact us if we can be of further service.  Stephanie Acre, MD [facility] Office Number: [phone]

## 2016-04-05 NOTE — Patient Instructions (Addendum)
Follow up with Dr. Dema SeverinMungal in: 6 weeks - Prednisone 12.5 mg daily until follow up  - Zpak - use as directed - CT Chest w/o contrast for eosinophilic pneumonia, chronic cough - sputum culture

## 2016-04-05 NOTE — Assessment & Plan Note (Signed)
Recurrent exacerbation during the first half of 2015 with elevated serum eosinophils and abnormal CT  Aspergillus IgE panel neg w/ low positive titers for Asp Nidulans, Flavus and Terreus and amstel.  She has an elevated total IgE level, CT findings with mild bronchiectasis, groundglass opacities-given her history of asthma and his current lab and radiographic abnormalities there is a high suspicion for ABPA. Currently being treated as ABPA, with steroid only.   Currently we are trying to taper her prednisone dose to the lowest possible effective dose that gives her the greatest clinical outcome. Patient got down to 7.5 mg every other day at one point, but is still having moderate amount of symptoms of cough, runny nose, sputum production, and shortness of breath. At today's visit she is having acute on chronic bronchitis, and we had to increase her prednisone to 12.5 mg daily  Absolute eosinophil = 0.1  Plan - Continue Advair as directed  Plan - prednisone 12.5 mg daily until follow up visit - Zpak - CT Chest w/o contrast - Sputum culture

## 2016-04-05 NOTE — Assessment & Plan Note (Signed)
Patient with worsening sputum production and characteristics today Will treat as acute bronchitis   Plan - prednisone 12.5 mg daily until follow up visit - Zpak - CT Chest - Sputum culture

## 2016-04-07 ENCOUNTER — Other Ambulatory Visit
Admission: RE | Admit: 2016-04-07 | Discharge: 2016-04-07 | Disposition: A | Discharge status: Home / Self Care | Payer: Medicare Other | Source: Ambulatory Visit | Attending: Internal Medicine | Admitting: Internal Medicine

## 2016-04-07 DIAGNOSIS — J4540 Moderate persistent asthma, uncomplicated: Secondary | ICD-10-CM | POA: Diagnosis not present

## 2016-04-07 DIAGNOSIS — J82 Pulmonary eosinophilia, not elsewhere classified: Secondary | ICD-10-CM | POA: Diagnosis not present

## 2016-04-07 DIAGNOSIS — J209 Acute bronchitis, unspecified: Secondary | ICD-10-CM | POA: Diagnosis not present

## 2016-04-07 LAB — EXPECTORATED SPUTUM ASSESSMENT W GRAM STAIN, RFLX TO RESP C

## 2016-04-07 LAB — EXPECTORATED SPUTUM ASSESSMENT W REFEX TO RESP CULTURE

## 2016-04-10 LAB — CULTURE, RESPIRATORY W GRAM STAIN: Culture: NORMAL

## 2016-04-10 LAB — CULTURE, RESPIRATORY

## 2016-04-13 DIAGNOSIS — J301 Allergic rhinitis due to pollen: Secondary | ICD-10-CM | POA: Diagnosis not present

## 2016-04-19 DIAGNOSIS — J301 Allergic rhinitis due to pollen: Secondary | ICD-10-CM | POA: Diagnosis not present

## 2016-04-26 ENCOUNTER — Telehealth: Payer: Self-pay

## 2016-04-26 NOTE — Telephone Encounter (Signed)
Pt left v/m; pt has arthritis in fingers and knees; pt is taking prednisone and pt was advised that she cannot take ibuprofen with prednisone; pt wants different med than ibuprofen. Pt has tried tylenol but not very effective; arthritis really bothering pt. [facility]. Last seen 11/05/15.

## 2016-04-27 ENCOUNTER — Telehealth: Payer: Self-pay | Admitting: Family Medicine

## 2016-04-27 DIAGNOSIS — E099 Drug or chemical induced diabetes mellitus without complications: Secondary | ICD-10-CM

## 2016-04-27 DIAGNOSIS — E039 Hypothyroidism, unspecified: Secondary | ICD-10-CM

## 2016-04-27 DIAGNOSIS — E7800 Pure hypercholesterolemia, unspecified: Secondary | ICD-10-CM

## 2016-04-27 DIAGNOSIS — K219 Gastro-esophageal reflux disease without esophagitis: Secondary | ICD-10-CM

## 2016-04-27 DIAGNOSIS — J301 Allergic rhinitis due to pollen: Secondary | ICD-10-CM | POA: Diagnosis not present

## 2016-04-27 DIAGNOSIS — T380X5A Adverse effect of glucocorticoids and synthetic analogues, initial encounter: Secondary | ICD-10-CM

## 2016-04-27 MED ORDER — TRAMADOL HCL 50 MG PO TABS: 50.0000 mg | ORAL_TABLET | Freq: Two times a day (BID) | ORAL | Status: DC | PRN

## 2016-04-27 NOTE — Telephone Encounter (Signed)
-----   Message from Alvina Chouerri J Walsh sent at 04/26/2016  2:47 PM EDT ----- Regarding: Lab orders for Wednesday, 7.26.17 Patient is scheduled for CPX labs, please order future labs, Thanks , Camelia Engerri

## 2016-04-27 NOTE — Telephone Encounter (Signed)
We can try tramadol with caution - it can be sedating and habit forming (and she may be subject to random drug screens as well)  Please call in

## 2016-04-27 NOTE — Telephone Encounter (Signed)
Rx called in as prescribed and pt notified and advised of Dr. Royden Purlower's comments

## 2016-05-03 ENCOUNTER — Other Ambulatory Visit (INDEPENDENT_AMBULATORY_CARE_PROVIDER_SITE_OTHER): Payer: Medicare Other

## 2016-05-03 DIAGNOSIS — E7800 Pure hypercholesterolemia, unspecified: Secondary | ICD-10-CM | POA: Diagnosis not present

## 2016-05-03 DIAGNOSIS — E039 Hypothyroidism, unspecified: Secondary | ICD-10-CM | POA: Diagnosis not present

## 2016-05-03 DIAGNOSIS — T380X5A Adverse effect of glucocorticoids and synthetic analogues, initial encounter: Secondary | ICD-10-CM | POA: Diagnosis not present

## 2016-05-03 DIAGNOSIS — K219 Gastro-esophageal reflux disease without esophagitis: Secondary | ICD-10-CM

## 2016-05-03 DIAGNOSIS — E099 Drug or chemical induced diabetes mellitus without complications: Secondary | ICD-10-CM | POA: Diagnosis not present

## 2016-05-03 DIAGNOSIS — J301 Allergic rhinitis due to pollen: Secondary | ICD-10-CM | POA: Diagnosis not present

## 2016-05-03 LAB — TSH: TSH: 2.55 u[IU]/mL (ref 0.35–4.50)

## 2016-05-03 LAB — CBC WITH DIFFERENTIAL/PLATELET
Basophils Absolute: 0 10*3/uL (ref 0.0–0.1)
Basophils Relative: 0.3 % (ref 0.0–3.0)
Eosinophils Absolute: 0.5 10*3/uL (ref 0.0–0.7)
Eosinophils Relative: 5.4 % — ABNORMAL HIGH (ref 0.0–5.0)
HCT: 41 % (ref 36.0–46.0)
Hemoglobin: 13.7 g/dL (ref 12.0–15.0)
Lymphocytes Relative: 41.8 % (ref 12.0–46.0)
Lymphs Abs: 4.1 10*3/uL — ABNORMAL HIGH (ref 0.7–4.0)
MCHC: 33.3 g/dL (ref 30.0–36.0)
MCV: 89.5 fl (ref 78.0–100.0)
Monocytes Absolute: 0.9 10*3/uL (ref 0.1–1.0)
Monocytes Relative: 8.7 % (ref 3.0–12.0)
Neutro Abs: 4.3 10*3/uL (ref 1.4–7.7)
Neutrophils Relative %: 43.8 % (ref 43.0–77.0)
Platelets: 222 10*3/uL (ref 150.0–400.0)
RBC: 4.58 Mil/uL (ref 3.87–5.11)
RDW: 13.7 % (ref 11.5–15.5)
WBC: 9.8 10*3/uL (ref 4.0–10.5)

## 2016-05-03 LAB — COMPREHENSIVE METABOLIC PANEL
ALT: 13 U/L (ref 0–35)
AST: 13 U/L (ref 0–37)
Albumin: 3.6 g/dL (ref 3.5–5.2)
Alkaline Phosphatase: 72 U/L (ref 39–117)
BUN: 20 mg/dL (ref 6–23)
CALCIUM: 9.4 mg/dL (ref 8.4–10.5)
CHLORIDE: 109 meq/L (ref 96–112)
CO2: 28 meq/L (ref 19–32)
Creatinine, Ser: 1.21 mg/dL — ABNORMAL HIGH (ref 0.40–1.20)
GFR: 56.86 mL/min — AB (ref >60.00)
Glucose, Bld: 85 mg/dL (ref 70–99)
Potassium: 3.5 mEq/L (ref 3.5–5.1)
Sodium: 145 mEq/L (ref 135–145)
Total Bilirubin: 0.3 mg/dL (ref 0.2–1.2)
Total Protein: 6.9 g/dL (ref 6.0–8.3)

## 2016-05-03 LAB — LIPID PANEL
CHOLESTEROL: 191 mg/dL (ref 0–200)
HDL: 71.1 mg/dL (ref >39.00)
LDL CALC: 109 mg/dL — AB (ref 0–99)
NonHDL: 119.99
TRIGLYCERIDES: 54 mg/dL (ref 0.0–149.0)
Total CHOL/HDL Ratio: 3
VLDL: 10.8 mg/dL (ref 0.0–40.0)

## 2016-05-03 LAB — HEMOGLOBIN A1C: Hgb A1c MFr Bld: 6.3 % (ref 4.6–6.5)

## 2016-05-04 ENCOUNTER — Ambulatory Visit
Admission: RE | Admit: 2016-05-04 | Discharge: 2016-05-04 | Disposition: A | Discharge status: Home / Self Care | Payer: Medicare Other | Source: Ambulatory Visit | Attending: Internal Medicine | Admitting: Internal Medicine

## 2016-05-04 DIAGNOSIS — I2510 Atherosclerotic heart disease of native coronary artery without angina pectoris: Secondary | ICD-10-CM | POA: Insufficient documentation

## 2016-05-04 DIAGNOSIS — R0602 Shortness of breath: Secondary | ICD-10-CM | POA: Diagnosis not present

## 2016-05-04 DIAGNOSIS — R61 Generalized hyperhidrosis: Secondary | ICD-10-CM | POA: Insufficient documentation

## 2016-05-04 DIAGNOSIS — J4540 Moderate persistent asthma, uncomplicated: Secondary | ICD-10-CM | POA: Diagnosis not present

## 2016-05-04 DIAGNOSIS — I700 Atherosclerosis of aorta: Secondary | ICD-10-CM | POA: Insufficient documentation

## 2016-05-04 DIAGNOSIS — J8281 Chronic eosinophilic pneumonia: Secondary | ICD-10-CM

## 2016-05-04 DIAGNOSIS — J82 Pulmonary eosinophilia, not elsewhere classified: Secondary | ICD-10-CM | POA: Insufficient documentation

## 2016-05-09 ENCOUNTER — Encounter: Payer: Medicare Other | Admitting: Family Medicine

## 2016-05-10 ENCOUNTER — Encounter: Payer: Self-pay | Admitting: Internal Medicine

## 2016-05-11 ENCOUNTER — Ambulatory Visit (INDEPENDENT_AMBULATORY_CARE_PROVIDER_SITE_OTHER): Payer: Medicare Other | Admitting: Internal Medicine

## 2016-05-11 ENCOUNTER — Encounter: Payer: Self-pay | Admitting: Internal Medicine

## 2016-05-11 VITALS — BP 150/88 | HR 94 | Wt 273.0 lb

## 2016-05-11 DIAGNOSIS — B4481 Allergic bronchopulmonary aspergillosis: Secondary | ICD-10-CM

## 2016-05-11 DIAGNOSIS — R55 Syncope and collapse: Secondary | ICD-10-CM

## 2016-05-11 DIAGNOSIS — J82 Pulmonary eosinophilia, not elsewhere classified: Secondary | ICD-10-CM | POA: Diagnosis not present

## 2016-05-11 DIAGNOSIS — G4733 Obstructive sleep apnea (adult) (pediatric): Secondary | ICD-10-CM

## 2016-05-11 DIAGNOSIS — J301 Allergic rhinitis due to pollen: Secondary | ICD-10-CM | POA: Diagnosis not present

## 2016-05-11 DIAGNOSIS — Z9989 Dependence on other enabling machines and devices: Secondary | ICD-10-CM

## 2016-05-11 DIAGNOSIS — R053 Chronic cough: Secondary | ICD-10-CM

## 2016-05-11 DIAGNOSIS — R05 Cough: Secondary | ICD-10-CM | POA: Diagnosis not present

## 2016-05-11 DIAGNOSIS — J209 Acute bronchitis, unspecified: Secondary | ICD-10-CM

## 2016-05-11 DIAGNOSIS — J8281 Chronic eosinophilic pneumonia: Secondary | ICD-10-CM

## 2016-05-11 HISTORY — DX: Syncope and collapse: R55

## 2016-05-11 NOTE — Assessment & Plan Note (Signed)
Cont with CPAP nightly CPAP 11cm H20 

## 2016-05-11 NOTE — Assessment & Plan Note (Signed)
Chronic eosinophilic pneumonia   -diagnosed by history, symptoms, transbronchial biopsy and bronchoscopy on 09/24/2015   At prior visit patient stated that Rachael Silva has a hx of nasal polyps, rhinosinusitis and has been on [facility] powder for a number of years.   Discussed cessation of BC powder and all forms of aspirin with the patient, currently with mild improvement, but her prednisone dose has also increased In the interm we will consider anti-IgE therapy (Xolair) or Anti-IL5 therapy (Nucala) workup, given her difficult to control asthma and need for chronic steroid.   Plan: -Prednisone 12.5 mg daily until follow up.  Avoid any allergens Continue Advair Continue allergy regiment and allergy shot Avoid sick contacts. Stop BC powder and all forms of Aspirin

## 2016-05-11 NOTE — Patient Instructions (Addendum)
Follow up with Dr. Dema Severin in:4-6 weeks - cont with Prednisone 12.5mg  daily until follow up visit.  - cont with current inhalers - cont with diet, exercise and wt loss measures - CPAP compliance report at next visit.

## 2016-05-11 NOTE — Assessment & Plan Note (Addendum)
Patient with recent syncopal episode after intense laughing. Daughter states that this has happened once or twice in the past, again only with intense laughing. Today she denies any significant chest discomfort, diaphoresis, or arm pain. Most likely this was a vasovagal response or hyperventilatory episode causing temporary syncope.  Plan: Continue with close observation If this becomes persistent, will need further workup with EKG, carotid Dopplers, cardiology follow-up

## 2016-05-11 NOTE — Assessment & Plan Note (Signed)
Again this is a working diagnosis, other differentials include hypersensitivity pneumonitis, eosinophilic pneumonias, NSIP, BOOP. All of these diagnoses are treated with high-dose steroids for a prolonged period of time. She does have significant clinical improvement once steroids were initiated, with rapid decline when steroids are terminated. Her further workup with Aspergillus skin testing, IgG levels, IgA levels were within normal limits.  The results of the skin testing and immunoglobulin testing were discussed in detail with the patient, they reviewed by me personally, and the patient was given a copy of her results.  Patient educated on side effects of steroids (weight gain, wereretention, increased appetite, muscle weakness, bone degradation, dysfunctional calcium levels, elevated blood glucose levels, anxiety/agitation at night, decreased immune).  Will continue to taper steroid doses to the lowest possible dose that will provide symptomatic relief, the premise is to completely taper steroids off. Given her negative Aspergillus skin testing (this was done while patient on high-dose steroids, which could also give a false negative read), at some point in the future if steroids can be tapered off or patient has relapse of symptoms. Patient educated on the current diagnosis, she understands that this is not an active infection, it is most likely a hyperreactive reaction to antigen exposure. She does not need/require antifungal treatment at this time. This is her initial treatment course, if she has refractory or recurrent symptoms will consider adding antifungals at that time.  She is now status post  Bronchoscopy 09/2015: All cultures including fungal, bacterial, AFB a currently negative to date. Transbronchial biopsy showed eosinophilic infiltration consistent with eosinophilic pneumonia  Plan:  -continue prednisone 12.5mg  daily. Try to wean at next visit.  Continue allergy regiment Continue  Advair Continue allergen avoidance  avoid sick contacts

## 2016-05-11 NOTE — Assessment & Plan Note (Signed)
Now improving. She's completed her Z-Pak, and prednisone is currently at 12.5 mg daily for chronic eosinophilic pneumonia/ABPA. CT scan reviewed in detail with the patient-inflammatory pulmonary nodes, mild basilar linear scarring.  Plan: -Continue with this on 12.5 mg daily until follow-up visit

## 2016-05-11 NOTE — Progress Notes (Signed)
MRN# [id number] Rachael Silva 23-Feb-1946   CC: Chief Complaint  Patient presents with  . Follow-up    cough gone, SOB w/excertion;    synopsis: 70 year old female past medical history of asthma. Being followed by pulmonary for chronic eosinophilic pneumonia, status post bronchoscopy in December 2016 with negative cultures for AFB and fungus. Transbronchial biopsy-positive for eosinophilic infiltrate. Treated with steroids unable to wean since  October 2015.   Events since last clinic visit: Patient presents today for follow-up visit of her ABPA, asthma, chronic prednisone use secondary to lung disease. Her last visit she was having an acute on chronic exacerbation of her eosinophilic pneumonia/ABPA, her steroids was increased to 12.5 mg daily until follow-up visit, she was placed on a Z-Pak, and a CT chest was ordered. Since been placed on 12.5 mg daily, her symptoms of mostly resolved, she is no longer having cough, or significant shortness of breath. Today her daughters accompanied her. Stated that she had a less than 5 seconds syncopal event on Monday after an intense lasting episode. Mild headache afterwards, note chest pain, no chest tightness, no arm pain, no diaphoresis. Using CPAP nightly.    Review of Systems: Gen:  Denies  fever, sweats, chills HEENT: Denies blurred vision, double vision, ear pain, eye pain, hearing loss, nose bleeds, sore throat Cvc:  No dizziness, chest pain or heaviness Resp:   Admits PX:TGGYI, DOE (stable), nasal congestion Gi: Denies swallowing difficulty, stomach pain, nausea or vomiting, diarrhea, constipation, bowel incontinence Gu:  Denies bladder incontinence, burning urine Ext:   No Joint pain, stiffness or swelling Skin: No skin rash, easy bruising or bleeding or hives Endoc:  No polyuria, polydipsia , polyphagia or weight change Other:  All other systems negative  Allergies:  Erythromycin  Physical Examination:  VS: BP (!) 150/88 (BP Location:  Left Arm, Cuff Size: Normal)   Pulse 94   Wt 273 lb (123.8 kg)   SpO2 97%   BMI 42.76 kg/m   General Appearance: No distress  HEENT: PERRLA, no ptosis, no other lesions noticed. No bruits Pulmonary:normal breath sounds., diaphragmatic excursion normal.No wheezing, No rales   Cardiovascular:  Normal S1,S2.  No m/r/g.     Abdomen:Exam: Benign, Soft, non-tender, No masses  Skin:   warm, no rashes, no ecchymosis  Extremities: normal, no cyanosis, clubbing, warm with normal capillary refill.     (The following images and results were reviewed by Dr. Dema Severin on 05/11/2016). CT Chest 05/04/16 FINDINGS: Cardiovascular: Scattered aortic hand coronary artery calcifications. Stable calcification within the left ventricle. The Mediastinum/Nodes: Pretracheal lymph node has a short axis diameter of 9 mm compared with 11 mm previously. Other smaller scattered mediastinal lymph nodes. No axillary adenopathy or visible hilar adenopathy. Lungs/Pleura: Linear areas of scarring in the lingula and both lower lobes at the lung bases. Bochdalek's hernia noted on the right containing fat, stable. No confluent airspace opacities otherwise. No effusions. Upper Abdomen: Imaging into the upper abdomen shows no acute findings. Musculoskeletal: Chest wall soft tissues are unremarkable. No acute bony abnormality or focal bone lesion. IMPRESSION: Areas of linear scarring in the lung bases bilaterally. Right Bochdalek's type diaphoretic hernia containing fat, stable. Scattered coronary artery and aortic calcifications. No acute findings.  LABS Absolute Eosinophil count - 0.1  Assessment and Plan: 70 year old female with chronic cough, recurrent URIs, being treated as ABPA/chronic eosinophilic pneumonia, unable to wean her steroids, now with an acute exacerbation of her ABPA  OSA on CPAP Cont with CPAP nightly CPAP 11cm H20  EP (eosinophilic pneumonia) (HCC)  Chronic eosinophilic pneumonia   -diagnosed by  history, symptoms, transbronchial biopsy and bronchoscopy on 09/24/2015   At prior visit patient stated that she has a hx of nasal polyps, rhinosinusitis and has been on [facility] powder for a number of years.   Discussed cessation of BC powder and all forms of aspirin with the patient, currently with mild improvement, but her prednisone dose has also increased In the interm we will consider anti-IgE therapy (Xolair) or Anti-IL5 therapy (Nucala) workup, given her difficult to control asthma and need for chronic steroid.   Plan: -Prednisone 12.5 mg daily until follow up.  Avoid any allergens Continue Advair Continue allergy regiment and allergy shot Avoid sick contacts. Stop BC powder and all forms of Aspirin   ABPA (allergic bronchopulmonary aspergillosis) Again this is a working diagnosis, other differentials include hypersensitivity pneumonitis, eosinophilic pneumonias, NSIP, BOOP. All of these diagnoses are treated with high-dose steroids for a prolonged period of time. She does have significant clinical improvement once steroids were initiated, with rapid decline when steroids are terminated. Her further workup with Aspergillus skin testing, IgG levels, IgA levels were within normal limits.  The results of the skin testing and immunoglobulin testing were discussed in detail with the patient, they reviewed by me personally, and the patient was given a copy of her results.  Patient educated on side effects of steroids (weight gain, wereretention, increased appetite, muscle weakness, bone degradation, dysfunctional calcium levels, elevated blood glucose levels, anxiety/agitation at night, decreased immune).  Will continue to taper steroid doses to the lowest possible dose that will provide symptomatic relief, the premise is to completely taper steroids off. Given her negative Aspergillus skin testing (this was done while patient on high-dose steroids, which could also give a false negative read), at  some point in the future if steroids can be tapered off or patient has relapse of symptoms. Patient educated on the current diagnosis, she understands that this is not an active infection, it is most likely a hyperreactive reaction to antigen exposure. She does not need/require antifungal treatment at this time. This is her initial treatment course, if she has refractory or recurrent symptoms will consider adding antifungals at that time.  She is now status post  Bronchoscopy 09/2015: All cultures including fungal, bacterial, AFB a currently negative to date. Transbronchial biopsy showed eosinophilic infiltration consistent with eosinophilic pneumonia  Plan:  -continue prednisone 12.5mg  daily. Try to wean at next visit.  Continue allergy regiment Continue Advair Continue allergen avoidance  avoid sick contacts     Acute bronchitis with bronchospasm Now improving. She's completed her Z-Pak, and prednisone is currently at 12.5 mg daily for chronic eosinophilic pneumonia/ABPA. CT scan reviewed in detail with the patient-inflammatory pulmonary nodes, mild basilar linear scarring.  Plan: -Continue with this on 12.5 mg daily until follow-up visit  Syncopal episodes Patient with recent syncopal episode after intense laughing. Daughter states that this has happened once or twice in the past, again only with intense laughing. Today she denies any significant chest discomfort, diaphoresis, or arm pain. Most likely this was a vasovagal response or hyperventilatory episode causing temporary syncope.  Plan: Continue with close observation If this becomes persistent, will need further workup with EKG, carotid Dopplers, cardiology follow-up    Updated Medication List Outpatient Encounter Prescriptions as of 05/11/2016  Medication Sig  . albuterol (ACCUNEB) 0.63 MG/3ML nebulizer solution Take 1 ampule by nebulization every 6 (six) hours as needed.    . B Complex-C-Folic  Acid (STRESS B COMPLEX  PO) Take 1 tablet by mouth daily.  . Calcium Carbonate-Vitamin D (CALTRATE 600+D PO) Take 1 tablet by mouth 2 (two) times daily.  Marland Kitchen co-enzyme Q-10 30 MG capsule Take 200 mg by mouth daily.  Marland Kitchen EPIPEN 2-PAK 0.3 MG/0.3ML SOAJ injection See admin instructions.  . fexofenadine (ALLEGRA ALLERGY) 60 MG tablet Take 60 mg by mouth daily.   . Fluticasone-Salmeterol (ADVAIR DISKUS) 250-50 MCG/DOSE AEPB Inhale 1 puff into the lungs 2 (two) times daily.   . furosemide (LASIX) 20 MG tablet TAKE 1 TABLET (20 MG TOTAL) BY MOUTH DAILY.  Marland Kitchen GARLIC PO Take 1 capsule by mouth daily.  Marland Kitchen glucose blood (ONE TOUCH ULTRA TEST) test strip USE TO TEST TWICE DAILY Dx E09.9  . levothyroxine (SYNTHROID, LEVOTHROID) 50 MCG tablet TAKE 1 TABLET BY MOUTH DAILY  . montelukast (SINGULAIR) 10 MG tablet Take 10 mg by mouth daily.    . Multiple Vitamins-Minerals (CENTRUM ADULTS PO) Take 1 tablet by mouth daily.  Marland Kitchen omeprazole (PRILOSEC) 40 MG capsule Take 1 capsule (40 mg total) by mouth daily. (Patient taking differently: Take 20 mg by mouth daily. )  . ONETOUCH DELICA LANCETS 33G MISC TEST SUGARS TWICE DAILY Dx E09.9  . predniSONE (DELTASONE) 5 MG tablet Take 2.5 tablets (12.5 mg total) by mouth daily with breakfast. Until follow up.  . traMADol (ULTRAM) 50 MG tablet Take 1 tablet (50 mg total) by mouth every 12 (twelve) hours as needed for severe pain (with caution of sedation).  . VENTOLIN HFA 108 (90 Base) MCG/ACT inhaler TAKE 1 TO 2 PUFFS EVERY 6 HOURS AS NEEDED  . [DISCONTINUED] ibuprofen (ADVIL,MOTRIN) 800 MG tablet TAKE 1 TABLET BY MOUTH EVERY 8 HOURS AS NEEDED FOR JOINT PAIN. TAKE WITH FOOD, STOP IT STOMACH UPSET   No facility-administered encounter medications on file as of 05/11/2016.     Orders for this visit: No orders of the defined types were placed in this encounter.   Thank  you for the visitation and for allowing  [facility] to assist in the care of your patient. Our recommendations are  noted above.  Please contact us if we can be of further service.  Stephanie Acre, MD [facility] Office Number: [phone]

## 2016-05-17 ENCOUNTER — Ambulatory Visit (INDEPENDENT_AMBULATORY_CARE_PROVIDER_SITE_OTHER): Payer: Medicare Other | Admitting: Family Medicine

## 2016-05-17 ENCOUNTER — Encounter: Payer: Self-pay | Admitting: Family Medicine

## 2016-05-17 VITALS — BP 122/66 | HR 73 | Temp 98.4°F | Ht 66.75 in | Wt 274.8 lb

## 2016-05-17 DIAGNOSIS — E039 Hypothyroidism, unspecified: Secondary | ICD-10-CM

## 2016-05-17 DIAGNOSIS — Z1231 Encounter for screening mammogram for malignant neoplasm of breast: Secondary | ICD-10-CM | POA: Insufficient documentation

## 2016-05-17 DIAGNOSIS — E7800 Pure hypercholesterolemia, unspecified: Secondary | ICD-10-CM | POA: Diagnosis not present

## 2016-05-17 DIAGNOSIS — B4481 Allergic bronchopulmonary aspergillosis: Secondary | ICD-10-CM

## 2016-05-17 DIAGNOSIS — E099 Drug or chemical induced diabetes mellitus without complications: Secondary | ICD-10-CM

## 2016-05-17 DIAGNOSIS — Z209 Contact with and (suspected) exposure to unspecified communicable disease: Secondary | ICD-10-CM

## 2016-05-17 DIAGNOSIS — T380X5S Adverse effect of glucocorticoids and synthetic analogues, sequela: Secondary | ICD-10-CM

## 2016-05-17 DIAGNOSIS — R55 Syncope and collapse: Secondary | ICD-10-CM

## 2016-05-17 DIAGNOSIS — T380X5A Adverse effect of glucocorticoids and synthetic analogues, initial encounter: Secondary | ICD-10-CM

## 2016-05-17 MED ORDER — LEVOTHYROXINE SODIUM 50 MCG PO TABS: 50.0000 ug | ORAL_TABLET | Freq: Every day | ORAL | 11 refills | Status: DC

## 2016-05-17 MED ORDER — FUROSEMIDE 20 MG PO TABS: ORAL_TABLET | ORAL | 11 refills | Status: DC

## 2016-05-17 MED ORDER — MONTELUKAST SODIUM 10 MG PO TABS: 10.0000 mg | ORAL_TABLET | Freq: Every day | ORAL | 11 refills | Status: DC

## 2016-05-17 NOTE — Assessment & Plan Note (Signed)
Nl dexa 2016 Watching renal fxn/enc water intake  Following A1C/DM

## 2016-05-17 NOTE — Assessment & Plan Note (Signed)
Discussed how this problem influences overall health and the risks it imposes  Reviewed plan for weight loss with lower calorie diet (via better food choices and also portion control or program like weight watchers) and exercise building up to or more than 30 minutes 5 days per week including some aerobic activity    

## 2016-05-17 NOTE — Assessment & Plan Note (Signed)
Disc goals for lipids and reasons to control them Rev labs with pt Rev low sat fat diet in detail  Stable with LDL of 109- goal is under [id number] if possible Will work further on diet Re check 6 mo

## 2016-05-17 NOTE — Assessment & Plan Note (Signed)
Stable and diet controlled currently  Lab Results  Component Value Date   HGBA1C 6.3 05/03/2016   Enc low glycemic diet and wt gain  She will schedule her annual eye exam

## 2016-05-17 NOTE — Assessment & Plan Note (Signed)
Pt states recent improvement with current prednisone and inhalers Will continue to see pulmonary

## 2016-05-17 NOTE — Progress Notes (Signed)
Subjective:    Patient ID: Rachael Silva, female    DOB: 1946/09/02, 70 y.o.   MRN: [id number]  HPI Here for annual f/u of chronic health problems   Having more arthritis pain - especially in her hands  Was px tramadol   Wt Readings from Last 3 Encounters:  05/17/16 274 lb 12 oz (124.6 kg)  05/11/16 273 lb (123.8 kg)  04/05/16 272 lb (123.4 kg)   bmi is 43.3 in the morbid obese range   Pap 2007 nl  No new partners  No hx of abn paps  No gyn symptoms   dexa 1/16-in the normal range   Mammogram 2/13 abnormal-then bx was neg Self exam - no lumps  We will ref to [facility]  Colonoscopy 12/09-normal with hemorrhoids   Hep C screen- wants to do today  Zoster vaccine -on prednisone so not option right now   Td 1/05  Due for prevnar -= she may have had it at pulmonary / unsure/ will ask   Eye exam 7/16- due for that   Flu vaccine-gets every fall   Had a syncopal episode with laughing in July Has hx of labile bp BP Readings from Last 3 Encounters:  05/17/16 122/66  05/11/16 (!) 150/88  04/05/16 (!) 142/88   Pulmonary specialist decided to hold off on further w/u unless it happens again    Seen for ABPA by pulmonary Doing better with breathing - happy about that!  Hypothyroidism  Pt has no clinical changes No change in energy level/ hair or skin/ edema and no tremor Lab Results  Component Value Date   TSH 2.55 05/03/2016     Steroid induced DM Lab Results  Component Value Date   HGBA1C 6.3 05/03/2016  improved - is still on prednisone Watches diet as well as she can  Hx of hyperlipidemia Lab Results  Component Value Date   CHOL 191 05/03/2016   HDL 71.10 05/03/2016   LDLCALC 109 (H) 05/03/2016   LDLDIRECT 138.9 04/07/2011   TRIG 54.0 05/03/2016   CHOLHDL 3 05/03/2016   overall stable  Almost at goal for LDL     Chemistry      Component Value Date/Time   NA 145 05/03/2016 0842   K 3.5 05/03/2016 0842   CL 109 05/03/2016 0842   CO2 28 05/03/2016  0842   BUN 20 05/03/2016 0842   CREATININE 1.21 (H) 05/03/2016 0842      Component Value Date/Time   CALCIUM 9.4 05/03/2016 0842   ALKPHOS 72 05/03/2016 0842   AST 13 05/03/2016 0842   ALT 13 05/03/2016 0842   BILITOT 0.3 05/03/2016 0842      Will try to drink more water   Patient Active Problem List   Diagnosis Date Noted  . Encounter for screening mammogram for breast cancer 05/17/2016  . Exposure to communicable disease 05/17/2016  . OSA on CPAP 05/11/2016  . Syncopal episodes 05/11/2016  . Chronic cough   . EP (eosinophilic pneumonia) (HCC)   . Memory impairment 05/11/2015  . Excessive daytime sleepiness 05/11/2015  . Snoring 05/05/2015  . Morbid obesity (HCC) 05/05/2015  . Steroid-induced diabetes mellitus (HCC) 11/11/2014  . Glucocorticoids and synthetic analogues causing adverse effect in therapeutic use 08/27/2014  . Long term current use of systemic steroids 08/27/2014  . Estrogen deficiency 08/27/2014  . ABPA (allergic bronchopulmonary aspergillosis) (HCC) 07/08/2014  . Cough 06/18/2014  . Asthma, chronic 06/18/2014  . Productive cough 05/27/2014  . Bursitis, shoulder 12/09/2013  .  Acute bronchitis with bronchospasm 05/23/2013  . Abnormal mammogram with microcalcification 06/06/2011  . Other screening mammogram 04/17/2011  . Routine general medical examination at a health care facility 04/17/2011  . Hypothyroidism 06/01/2009  . EDEMA 03/03/2009  . SINUSITIS, CHRONIC 08/18/2008  . ARTHRITIS, RIGHT KNEE 04/15/2008  . Weight gain due to medication 02/18/2008  . HOT FLASHES 02/18/2008  . GERD (gastroesophageal reflux disease) 08/01/2007  . HYPERCHOLESTEROLEMIA 05/15/2007  . ALLERGIC RHINITIS 05/15/2007  . ASTHMA 05/15/2007  . PERIODONTAL DISEASE 05/15/2007  . MURMUR 05/15/2007  . URINARY INCONTINENCE 05/15/2007   Past Medical History:  Diagnosis Date  . Allergy    allergic rhinitis  . Angioedema 02/2006  . Arthritis    OA of knees  . Asthma   .  Complication of anesthesia    apnea- sleep  . Frequent headaches   . Hiatal hernia   . Hypertension   . Hypothyroid   . Labile blood pressure   . Lower extremity edema   . Lung disease   . Pneumonia   . Pneumonia, eosinophilic (HCC)   . Shortness of breath dyspnea   . Sleep apnea   . Urinary incontinence   . Wheezing    Past Surgical History:  Procedure Laterality Date  . BREAST SURGERY  1966   breast biopsy  . CESAREAN SECTION     x2  . FLEXIBLE BRONCHOSCOPY N/A 09/16/2015   Procedure: FLEXIBLE BRONCHOSCOPY WITH FLURO ;  Surgeon: Stephanie Acre, MD;  Location: [facility] ORS;  Service: Pulmonary;  Laterality: N/A;   Social History  Substance Use Topics  . Smoking status: Never Smoker  . Smokeless tobacco: Never Used  . Alcohol use No   Family History  Problem Relation Age of Onset  . Diabetes Mother     pre-diabetic  . Heart disease Mother     ? heart disease  . Emphysema Mother   . Thyroid disease Mother   . Diabetes Maternal Uncle    Allergies  Allergen Reactions  . Erythromycin     REACTION: feels sick   Current Outpatient Prescriptions on File Prior to Visit  Medication Sig Dispense Refill  . albuterol (ACCUNEB) 0.63 MG/3ML nebulizer solution Take 1 ampule by nebulization every 6 (six) hours as needed.      . B Complex-C-Folic Acid (STRESS B COMPLEX PO) Take 1 tablet by mouth daily.    . Calcium Carbonate-Vitamin D (CALTRATE 600+D PO) Take 1 tablet by mouth 2 (two) times daily.    Marland Kitchen co-enzyme Q-10 30 MG capsule Take 200 mg by mouth daily.    Marland Kitchen EPIPEN 2-PAK 0.3 MG/0.3ML SOAJ injection See admin instructions.  99  . fexofenadine (ALLEGRA ALLERGY) 60 MG tablet Take 60 mg by mouth daily.     . Fluticasone-Salmeterol (ADVAIR DISKUS) 250-50 MCG/DOSE AEPB Inhale 1 puff into the lungs 2 (two) times daily.     Marland Kitchen GARLIC PO Take 1 capsule by mouth daily.    Marland Kitchen glucose blood (ONE TOUCH ULTRA TEST) test strip USE TO TEST TWICE DAILY Dx E09.9 50 each 11  . Multiple  Vitamins-Minerals (CENTRUM ADULTS PO) Take 1 tablet by mouth daily.    Marland Kitchen omeprazole (PRILOSEC) 40 MG capsule Take 1 capsule (40 mg total) by mouth daily. (Patient taking differently: Take 20 mg by mouth daily. ) 30 capsule 5  . ONETOUCH DELICA LANCETS 33G MISC TEST SUGARS TWICE DAILY Dx E09.9 100 each 3  . predniSONE (DELTASONE) 5 MG tablet Take 2.5 tablets (12.5 mg total) by mouth  daily with breakfast. Until follow up. 90 tablet 1  . VENTOLIN HFA 108 (90 Base) MCG/ACT inhaler TAKE 1 TO 2 PUFFS EVERY 6 HOURS AS NEEDED 18 g 11  . traMADol (ULTRAM) 50 MG tablet Take 1 tablet (50 mg total) by mouth every 12 (twelve) hours as needed for severe pain (with caution of sedation). (Patient not taking: Reported on 05/17/2016) 60 tablet 0   No current facility-administered medications on file prior to visit.     Review of Systems Review of Systems  Constitutional: Negative for fever, appetite change, fatigue and unexpected weight change.  Eyes: Negative for pain and visual disturbance.  Respiratory: Negative for cough and shortness of breath.  (breathing is improved) Cardiovascular: Negative for cp or palpitations    Gastrointestinal: Negative for nausea, diarrhea and constipation.  Genitourinary: Negative for urgency and frequency.  Skin: Negative for pallor or rash   MSK pos for oa pain in hands with deformities of periph fingers  Neurological: Negative for weakness, light-headedness, numbness and headaches.  Hematological: Negative for adenopathy. Does not bruise/bleed easily.  Psychiatric/Behavioral: Negative for dysphoric mood. The patient is not nervous/anxious.         Objective:   Physical Exam  Constitutional: She appears well-developed and well-nourished. No distress.  Morbidly obese and well appearing   HENT:  Head: Normocephalic and atraumatic.  Right Ear: External ear normal.  Left Ear: External ear normal.  Mouth/Throat: Oropharynx is clear and moist.  Eyes: Conjunctivae and EOM  are normal. Pupils are equal, round, and reactive to light. No scleral icterus.  Neck: Normal range of motion. Neck supple. No JVD present. Carotid bruit is not present. No thyromegaly present.  Cardiovascular: Normal rate, regular rhythm and intact distal pulses.  Exam reveals no gallop.   Murmur heard. Pulmonary/Chest: Effort normal and breath sounds normal. No respiratory distress. She has no wheezes. She has no rales. She exhibits no tenderness.  bs are slightly distant Good air exch w/o wheeze today  Abdominal: Soft. Bowel sounds are normal. She exhibits no distension, no abdominal bruit and no mass. There is no tenderness.  Genitourinary: No breast swelling, tenderness, discharge or bleeding.  Musculoskeletal: Normal range of motion. She exhibits no edema or tenderness.  Lymphadenopathy:    She has no cervical adenopathy.  Neurological: She is alert. She has normal reflexes. No cranial nerve deficit. She exhibits normal muscle tone. Coordination normal.  Skin: Skin is warm and dry. No rash noted. No erythema. No pallor.  Psychiatric: She has a normal mood and affect.          Assessment & Plan:   Problem List Items Addressed This Visit      Cardiovascular and Mediastinum   Syncopal episodes    Suspect vasovagal in pt with lung issues and labile bp  Rev assessment but pulmonary She will update if this happens again  Labs reviewed  Stable vitals      Relevant Medications   furosemide (LASIX) 20 MG tablet     Respiratory   ABPA (allergic bronchopulmonary aspergillosis) (HCC) - Primary    Pt states recent improvement with current prednisone and inhalers Will continue to see pulmonary         Endocrine   Steroid-induced diabetes mellitus (HCC)    Stable and diet controlled currently  Lab Results  Component Value Date   HGBA1C 6.3 05/03/2016   Enc low glycemic diet and wt gain  She will schedule her annual eye exam  Hypothyroidism    Hypothyroidism  Pt  has no clinical changes No change in energy level/ hair or skin/ edema and no tremor Lab Results  Component Value Date   TSH 2.55 05/03/2016          Relevant Medications   levothyroxine (SYNTHROID, LEVOTHROID) 50 MCG tablet     Other   Morbid obesity (HCC)    Discussed how this problem influences overall health and the risks it imposes  Reviewed plan for weight loss with lower calorie diet (via better food choices and also portion control or program like weight watchers) and exercise building up to or more than 30 minutes 5 days per week including some aerobic activity         HYPERCHOLESTEROLEMIA    Disc goals for lipids and reasons to control them Rev labs with pt Rev low sat fat diet in detail  Stable with LDL of 109- goal is under 161 if possible Will work further on diet Re check 6 mo      Relevant Medications   furosemide (LASIX) 20 MG tablet   Glucocorticoids and synthetic analogues causing adverse effect in therapeutic use    Nl dexa 2016 Watching renal fxn/enc water intake  Following A1C/DM      Exposure to communicable disease    Ordered hep C screen for next visit       Relevant Orders   Hepatitis C antibody   Encounter for screening mammogram for breast cancer    Scheduled annual screening mammogram Nl breast exam today  Encouraged monthly self exams        Relevant Orders   MM DIGITAL SCREENING BILATERAL    Other Visit Diagnoses   None.

## 2016-05-17 NOTE — Progress Notes (Signed)
Pre visit review using our clinic review tool, if applicable. No additional management support is needed unless otherwise documented below in the visit note. 

## 2016-05-17 NOTE — Assessment & Plan Note (Signed)
Hypothyroidism  Pt has no clinical changes No change in energy level/ hair or skin/ edema and no tremor Lab Results  Component Value Date   TSH 2.55 05/03/2016

## 2016-05-17 NOTE — Assessment & Plan Note (Signed)
Ordered hep C screen for next visit

## 2016-05-17 NOTE — Assessment & Plan Note (Signed)
Suspect vasovagal in pt with lung issues and labile bp  Rev assessment but pulmonary She will update if this happens again  Labs reviewed  Stable vitals

## 2016-05-17 NOTE — Assessment & Plan Note (Signed)
Scheduled annual screening mammogram Nl breast exam today  Encouraged monthly self exams   

## 2016-05-17 NOTE — Patient Instructions (Addendum)
Stop at check out for referral for mammogram  You are due for a 10 year tetanus shot- see the paper about getting it at a pharmacy  You may be due for a prevnar vaccine (it is the pneumovax 13) - ask your pulmonary doctor if you have had this with them and if not, you need it  Make sure to get your eye exam this summer  Get your flu shot in the fall  For cholesterol ( you are almost at goal)   Avoid red meat/ fried foods/ egg yolks/ fatty breakfast meats/ butter, cheese and high fat dairy/ and shellfish

## 2016-05-19 DIAGNOSIS — J301 Allergic rhinitis due to pollen: Secondary | ICD-10-CM | POA: Diagnosis not present

## 2016-05-22 DIAGNOSIS — J301 Allergic rhinitis due to pollen: Secondary | ICD-10-CM | POA: Diagnosis not present

## 2016-05-31 ENCOUNTER — Other Ambulatory Visit (INDEPENDENT_AMBULATORY_CARE_PROVIDER_SITE_OTHER): Payer: Medicare Other

## 2016-05-31 ENCOUNTER — Ambulatory Visit (INDEPENDENT_AMBULATORY_CARE_PROVIDER_SITE_OTHER): Payer: Medicare Other

## 2016-05-31 VITALS — BP 132/80 | HR 72 | Temp 98.4°F | Ht 67.0 in | Wt 274.0 lb

## 2016-05-31 DIAGNOSIS — E099 Drug or chemical induced diabetes mellitus without complications: Secondary | ICD-10-CM

## 2016-05-31 DIAGNOSIS — E7800 Pure hypercholesterolemia, unspecified: Secondary | ICD-10-CM | POA: Diagnosis not present

## 2016-05-31 DIAGNOSIS — Z23 Encounter for immunization: Secondary | ICD-10-CM | POA: Diagnosis not present

## 2016-05-31 DIAGNOSIS — T380X5A Adverse effect of glucocorticoids and synthetic analogues, initial encounter: Secondary | ICD-10-CM | POA: Diagnosis not present

## 2016-05-31 DIAGNOSIS — Z209 Contact with and (suspected) exposure to unspecified communicable disease: Secondary | ICD-10-CM | POA: Diagnosis not present

## 2016-05-31 DIAGNOSIS — Z0000 Encounter for general adult medical examination without abnormal findings: Secondary | ICD-10-CM | POA: Diagnosis not present

## 2016-05-31 LAB — COMPREHENSIVE METABOLIC PANEL
ALBUMIN: 3.8 g/dL (ref 3.5–5.2)
ALK PHOS: 62 U/L (ref 39–117)
ALT: 14 U/L (ref 0–35)
AST: 16 U/L (ref 0–37)
BUN: 15 mg/dL (ref 6–23)
CALCIUM: 9.2 mg/dL (ref 8.4–10.5)
CHLORIDE: 103 meq/L (ref 96–112)
CO2: 29 mEq/L (ref 19–32)
Creatinine, Ser: 1.06 mg/dL (ref 0.40–1.20)
GFR: 66.23 mL/min (ref >60.00)
Glucose, Bld: 102 mg/dL — ABNORMAL HIGH (ref 70–99)
POTASSIUM: 3.7 meq/L (ref 3.5–5.1)
SODIUM: 140 meq/L (ref 135–145)
TOTAL PROTEIN: 7.2 g/dL (ref 6.0–8.3)
Total Bilirubin: 0.4 mg/dL (ref 0.2–1.2)

## 2016-05-31 LAB — LIPID PANEL
CHOLESTEROL: 208 mg/dL — AB (ref 0–200)
HDL: 82.6 mg/dL (ref >39.00)
LDL Cholesterol: 115 mg/dL — ABNORMAL HIGH (ref 0–99)
NONHDL: 125.14
Total CHOL/HDL Ratio: 3
Triglycerides: 52 mg/dL (ref 0.0–149.0)
VLDL: 10.4 mg/dL (ref 0.0–40.0)

## 2016-05-31 LAB — HEPATITIS C ANTIBODY: HCV Ab: NEGATIVE

## 2016-05-31 LAB — HEMOGLOBIN A1C: HEMOGLOBIN A1C: 6.3 % (ref 4.6–6.5)

## 2016-05-31 NOTE — Progress Notes (Signed)
Subjective:   Rachael Silva is a 70 y.o. female who presents for an Initial Medicare Annual Wellness Visit.  Review of Systems    N/A  Cardiac Risk Factors include: obesity (BMI >30kg/m2);advanced age (>6655men, 53>65 women);diabetes mellitus;dyslipidemia     Objective:    Today's Vitals   05/31/16 0917  BP: 132/80  Pulse: 72  Temp: 98.4 F (36.9 C)  TempSrc: Oral  SpO2: 94%  Weight: 274 lb (124.3 kg)  Height: 5\' 7"  (1.702 m)  PainSc: 0-No pain   Body mass index is 42.91 kg/m.   Current Medications (verified) Outpatient Encounter Prescriptions as of 05/31/2016  Medication Sig  . albuterol (ACCUNEB) 0.63 MG/3ML nebulizer solution Take 1 ampule by nebulization every 6 (six) hours as needed.    . B Complex-C-Folic Acid (STRESS B COMPLEX PO) Take 1 tablet by mouth daily.  . Calcium Carbonate-Vitamin D (CALTRATE 600+D PO) Take 1 tablet by mouth 2 (two) times daily.  Marland Kitchen. co-enzyme Q-10 30 MG capsule Take 200 mg by mouth daily.  Marland Kitchen. EPIPEN 2-PAK 0.3 MG/0.3ML SOAJ injection See admin instructions.  . fexofenadine (ALLEGRA ALLERGY) 60 MG tablet Take 60 mg by mouth daily.   . Fluticasone-Salmeterol (ADVAIR DISKUS) 250-50 MCG/DOSE AEPB Inhale 1 puff into the lungs 2 (two) times daily.   . furosemide (LASIX) 20 MG tablet TAKE 1 TABLET (20 MG TOTAL) BY MOUTH DAILY.  Marland Kitchen. GARLIC PO Take 1 capsule by mouth daily.  Marland Kitchen. glucose blood (ONE TOUCH ULTRA TEST) test strip USE TO TEST TWICE DAILY Dx E09.9  . levothyroxine (SYNTHROID, LEVOTHROID) 50 MCG tablet Take 1 tablet (50 mcg total) by mouth daily.  . montelukast (SINGULAIR) 10 MG tablet Take 1 tablet (10 mg total) by mouth daily.  . Multiple Vitamins-Minerals (CENTRUM ADULTS PO) Take 1 tablet by mouth daily.  Marland Kitchen. omeprazole (PRILOSEC) 40 MG capsule Take 1 capsule (40 mg total) by mouth daily. (Patient taking differently: Take 20 mg by mouth daily. )  . ONETOUCH DELICA LANCETS 33G MISC TEST SUGARS TWICE DAILY Dx E09.9  . predniSONE (DELTASONE) 5 MG  tablet Take 2.5 tablets (12.5 mg total) by mouth daily with breakfast. Until follow up.  . VENTOLIN HFA 108 (90 Base) MCG/ACT inhaler TAKE 1 TO 2 PUFFS EVERY 6 HOURS AS NEEDED  . traMADol (ULTRAM) 50 MG tablet Take 1 tablet (50 mg total) by mouth every 12 (twelve) hours as needed for severe pain (with caution of sedation). (Patient not taking: Reported on 05/17/2016)   No facility-administered encounter medications on file as of 05/31/2016.     Allergies (verified) Erythromycin   History: Past Medical History:  Diagnosis Date  . Allergy    allergic rhinitis  . Angioedema 02/2006  . Arthritis    OA of knees  . Asthma   . Complication of anesthesia    apnea- sleep  . Frequent headaches   . Hiatal hernia   . Hypertension   . Hypothyroid   . Labile blood pressure   . Lower extremity edema   . Lung disease   . Pneumonia   . Pneumonia, eosinophilic (HCC)   . Shortness of breath dyspnea   . Sleep apnea   . Urinary incontinence   . Wheezing    Past Surgical History:  Procedure Laterality Date  . BREAST SURGERY  1966   breast biopsy  . CESAREAN SECTION     x2  . FLEXIBLE BRONCHOSCOPY N/A 09/16/2015   Procedure: FLEXIBLE BRONCHOSCOPY WITH FLURO ;  Surgeon: Stephanie AcreVishal Mungal, MD;  Location: [facility] ORS;  Service: Pulmonary;  Laterality: N/A;   Family History  Problem Relation Age of Onset  . Diabetes Mother     pre-diabetic  . Heart disease Mother     ? heart disease  . Emphysema Mother   . Thyroid disease Mother   . Diabetes Maternal Uncle    Social History   Occupational History  . retired Retired   Social History Main Topics  . Smoking status: Never Smoker  . Smokeless tobacco: Never Used  . Alcohol use No  . Drug use: No  . Sexual activity: No    Tobacco Counseling Counseling given: No   Activities of Daily Living In your present state of health, do you have any difficulty performing the following activities: 05/31/2016 09/10/2015  Hearing? Malvin Johns  Vision? N N    Difficulty concentrating or making decisions? N N  Walking or climbing stairs? N Y  Dressing or bathing? N N  Doing errands, shopping? N -  Preparing Food and eating ? N -  Using the Toilet? N -  In the past six months, have you accidently leaked urine? Y -  Do you have problems with loss of bowel control? N -  Managing your Medications? N -  Managing your Finances? N -  Housekeeping or managing your Housekeeping? N -  Some recent data might be hidden    Immunizations and Health Maintenance Immunization History  Administered Date(s) Administered  . Influenza Split 07/27/2011, 08/19/2012  . Influenza Whole 08/01/2007, 07/13/2008, 07/20/2009, 07/20/2010  . Influenza,inj,Quad PF,36+ Mos 07/29/2013, 07/15/2014, 07/09/2015  . Pneumococcal Conjugate-13 05/31/2016  . Pneumococcal Polysaccharide-23 10/09/2001, 01/13/2014  . Td 10/10/2003   There are no preventive care reminders to display for this patient.  Patient Care Team: Judy Pimple, MD as PCP - General     Assessment:   This is a routine wellness examination for Rachael Silva.   Hearing/Vision screen Hearing Screening Comments: Wears bilateral hearing aids Vision Screening Comments: Last vision exam in July 2016  Dietary issues and exercise activities discussed: Current Exercise Habits: Home exercise routine, Type of exercise: Other - see comments (bicycle, elliptical ), Time (Minutes): 30, Frequency (Times/Week): 3, Weekly Exercise (Minutes/Week): 90, Intensity: Moderate, Exercise limited by: None identified  Goals    . Increase physical activity          Starting 05/31/2016, I will continue to exercise for 30 min 3 days per week.       Depression Screen PHQ 2/9 Scores 05/31/2016 05/17/2016 03/02/2015  PHQ - 2 Score 0 - 0  Exception Documentation - Patient refusal -    Fall Risk Fall Risk  05/31/2016 05/17/2016 05/27/2015 03/02/2015  Falls in the past year? No No No No    Cognitive Function: MMSE - Mini Mental State Exam  05/31/2016  Orientation to time 5  Orientation to Place 5  Registration 3  Attention/ Calculation 0  Recall 2  Recall-comments pt was unable to recall 1 of 3 words  Language- name 2 objects 0  Language- repeat 1  Language- follow 3 step command 3  Language- read & follow direction 0  Write a sentence 0  Copy design 0  Total score 19   PLEASE NOTE: A Mini-Cog screen was completed. Maximum score is 20. A value of 0 denotes this part of Folstein MMSE was not completed or the patient failed this part of the Mini-Cog screening.   Mini-Cog Screening Orientation to Time - Max 5 pts Orientation to Place -  Max 5 pts Registration - Max 3 pts Recall - Max 3 pts Language Repeat - Max 1 pts Language Follow 3 Step Command - Max 3 pts  Screening Tests Health Maintenance  Topic Date Due  . INFLUENZA VACCINE  10/08/2016 (Originally 05/09/2016)  . MAMMOGRAM  10/08/2016 (Originally 06/01/2013)  . OPHTHALMOLOGY EXAM  10/08/2016 (Originally 04/13/2016)  . ZOSTAVAX  05/17/2017 (Originally 01/31/2008)  . TETANUS/TDAP  05/31/2017 (Originally 10/09/2013)  . HEMOGLOBIN A1C  11/03/2016  . FOOT EXAM  11/04/2016  . URINE MICROALBUMIN  05/31/2017  . COLONOSCOPY  07/20/2018  . DEXA SCAN  Completed  . Hepatitis C Screening  Completed  . PNA vac Low Risk Adult  Completed      Plan:     I have personally reviewed and addressed the Medicare Annual Wellness questionnaire and have noted the following in the patient's chart:  A. Medical and social history B. Use of alcohol, tobacco or illicit drugs  C. Current medications and supplements D. Functional ability and status E.  Nutritional status F.  Physical activity G. Advance directives H. List of other physicians I.  Hospitalizations, surgeries, and ER visits in previous 12 months J.  Vitals K. Screenings to include hearing, vision, cognitive, depression L. Referrals and appointments - none  In addition, I have reviewed and discussed with patient certain  preventive protocols, quality metrics, and best practice recommendations. A written personalized care plan for preventive services as well as general preventive health recommendations were provided to patient.  See attached scanned questionnaire for additional information.   Signed,   Randa EvensLesia Pinson, MHA, BS, LPN Health Advisor

## 2016-05-31 NOTE — Progress Notes (Signed)
PCP notes:   Health maintenance:  Hep C screening - completed PCV13 - administered Tetanus - postponed/insurance Eye exam - pt will schedule Urine Microalbumin - will be completed with future labs Flu vaccine - addressed  Abnormal screenings:   Cognitive/Mini-Cog score: 19/20  Patient concerns:   None  Nurse concerns:  None  Next PCP appt:   11/17/2016 @ 1115  I reviewed health advisor's note, was available for consultation, and agree with documentation and plan.  Roxy MannsMarne Tower MD

## 2016-05-31 NOTE — Patient Instructions (Addendum)
[name] , Thank you for taking time to come for your Medicare Wellness Visit. I appreciate your ongoing commitment to your health goals. Please review the following plan we discussed and let me know if I can assist you in the future.   These are the goals we discussed: Goals    Starting 05/31/2016, I will continue to exercise for 30 min 3 days per week.       This is a list of the screening recommended for you and due dates:  Health Maintenance  Topic Date Due  . Flu Shot  10/08/2016*  . Mammogram  10/08/2016*  . Eye exam for diabetics  10/08/2016*  . Shingles Vaccine  05/17/2017*  . Tetanus Vaccine  05/31/2017*  . Hemoglobin A1C  11/03/2016  . Complete foot exam   11/04/2016  . Urine Protein Check  11/13/2016  . Colon Cancer Screening  07/20/2018  . DEXA scan (bone density measurement)  Completed  .  Hepatitis C: One time screening is recommended by Center for Disease Control  (CDC) for  adults born from [birth year] through 1963.   Completed  . Pneumonia vaccines  Completed  *Topic was postponed. The date shown is not the original due date.   Preventive Care for Adults  A healthy lifestyle and preventive care can promote health and wellness. Preventive health guidelines for adults include the following key practices.  . A routine yearly physical is a good way to check with your health care provider about your health and preventive screening. It is a chance to share any concerns and updates on your health and to receive a thorough exam.  . Visit your dentist for a routine exam and preventive care every 6 months. Brush your teeth twice a day and floss once a day. Good oral hygiene prevents tooth decay and gum disease.  . The frequency of eye exams is based on your age, health, family medical history, use  of contact lenses, and other factors. Follow your health care provider's ecommendations for frequency of eye exams.  . Eat a healthy diet. Foods like vegetables, fruits, whole grains,  low-fat dairy products, and lean protein foods contain the nutrients you need without too many calories. Decrease your intake of foods high in solid fats, added sugars, and salt. Eat the right amount of calories for you. Get information about a proper diet from your health care provider, if necessary.  . Regular physical exercise is one of the most important things you can do for your health. Most adults should get at least 150 minutes of moderate-intensity exercise (any activity that increases your heart rate and causes you to sweat) each week. In addition, most adults need muscle-strengthening exercises on 2 or more days a week.  Silver Sneakers may be a benefit available to you. To determine eligibility, you may visit the website: www.silversneakers.com or contact program at (773)[phone] Mon-Fri between 8AM-8PM.   . Maintain a healthy weight. The body mass index (BMI) is a screening tool to identify possible weight problems. It provides an estimate of body fat based on height and weight. Your health care provider can find your BMI and can help you achieve or maintain a healthy weight.   For adults 20 years and older: ? A BMI below 18.5 is considered underweight. ? A BMI of 18.5 to 24.9 is normal. ? A BMI of 25 to 29.9 is considered overweight. ? A BMI of 30 and above is considered obese.   . Maintain normal blood lipids  and cholesterol levels by exercising and minimizing your intake of saturated fat. Eat a balanced diet with plenty of fruit and vegetables. Blood tests for lipids and cholesterol should begin at age 72 and be repeated every 5 years. If your lipid or cholesterol levels are high, you are over 50, or you are at high risk for heart disease, you may need your cholesterol levels checked more frequently. Ongoing high lipid and cholesterol levels should be treated with medicines if diet and exercise are not working.  . If you smoke, find out from your health care provider how to quit. If  you do not use tobacco, please do not start.  . If you choose to drink alcohol, please do not consume more than 2 drinks per day. One drink is considered to be 12 ounces (355 mL) of beer, 5 ounces (148 mL) of wine, or 1.5 ounces (44 mL) of liquor.  . If you are 8-46 years old, ask your health care provider if you should take aspirin to prevent strokes.  . Use sunscreen. Apply sunscreen liberally and repeatedly throughout the day. You should seek shade when your shadow is shorter than you. Protect yourself by wearing long sleeves, pants, a wide-brimmed hat, and sunglasses year round, whenever you are outdoors.  . Once a month, do a whole body skin exam, using a mirror to look at the skin on your back. Tell your health care provider of new moles, moles that have irregular borders, moles that are larger than a pencil eraser, or moles that have changed in shape or color.

## 2016-05-31 NOTE — Progress Notes (Signed)
Pre visit review using our clinic review tool, if applicable. No additional management support is needed unless otherwise documented below in the visit note. 

## 2016-06-01 DIAGNOSIS — J301 Allergic rhinitis due to pollen: Secondary | ICD-10-CM | POA: Diagnosis not present

## 2016-06-07 DIAGNOSIS — J301 Allergic rhinitis due to pollen: Secondary | ICD-10-CM | POA: Diagnosis not present

## 2016-06-15 ENCOUNTER — Encounter: Payer: Self-pay | Admitting: Internal Medicine

## 2016-06-15 ENCOUNTER — Ambulatory Visit (INDEPENDENT_AMBULATORY_CARE_PROVIDER_SITE_OTHER): Payer: Medicare Other | Admitting: Internal Medicine

## 2016-06-15 ENCOUNTER — Ambulatory Visit: Payer: Medicare Other | Admitting: Internal Medicine

## 2016-06-15 VITALS — BP 142/88 | HR 92 | Wt 274.0 lb

## 2016-06-15 DIAGNOSIS — J301 Allergic rhinitis due to pollen: Secondary | ICD-10-CM | POA: Diagnosis not present

## 2016-06-15 DIAGNOSIS — B4481 Allergic bronchopulmonary aspergillosis: Secondary | ICD-10-CM | POA: Diagnosis not present

## 2016-06-15 DIAGNOSIS — J8281 Chronic eosinophilic pneumonia: Secondary | ICD-10-CM

## 2016-06-15 DIAGNOSIS — J82 Pulmonary eosinophilia, not elsewhere classified: Secondary | ICD-10-CM

## 2016-06-15 NOTE — Assessment & Plan Note (Addendum)
Chronic eosinophilic pneumonia   -diagnosed by history, symptoms, transbronchial biopsy and bronchoscopy on 09/24/2015   At prior visit patient stated that she has a hx of nasal polyps, rhinosinusitis and [facility] for a number of years.   Discussed cessation of BC powder and all forms of aspirin with the patient, currently with mild improvement, but her prednisone dose has also increased In the interm we will consider anti-IgE therapy (Xolair) or Anti-IL5 therapy (Nucala) workup, given her difficult to control asthma and need for chronic steroid.   Plan: -Prednisone 10 mg daily until follow up.  Avoid any allergens Continue Advair Continue allergy regiment and allergy shot Avoid sick contacts. Stopped BC powder and all forms of Aspirin May need to consider Xolair if unable to wean  CBC with diff and absolute eosinophil count before next visit. 2 view cxr prior to follow up visit.

## 2016-06-15 NOTE — Progress Notes (Signed)
MRN# [id number] Rachael Silva 1945/11/27   CC: Chief Complaint  Patient presents with  . Follow-up    SOB better; no cough; doing allergy injections weekly;   synopsis: 70 year old female past medical history of asthma. Being followed by pulmonary for chronic eosinophilic pneumonia, status post bronchoscopy in December 2016 with negative cultures for AFB and fungus. Transbronchial biopsy-positive for eosinophilic infiltrate. Treated with steroids unable to wean since  October 2015.   Events since last clinic visit: Patient presents today for follow-up visit of her ABPA, asthma, chronic prednisone use secondary to lung disease. Patient states today that she is doing well, she is currently on prednisone 12.5 mg daily, and receiving allergy shots once weekly. Since her last visit she's not had any further exacerbations of eosinophilic pneumonia or ABPA. She does not endorse any worsening shortness of breath, sputum production, fever, chills.   Review of Systems: Gen:  Denies  fever, sweats, chills HEENT: Denies blurred vision, double vision, ear pain, eye pain, hearing loss, nose bleeds, sore throat Cvc:  No dizziness, chest pain or heaviness Resp:   Admits VO:ZDGUYto:cough, DOE (stable), nasal congestion Gi: Denies swallowing difficulty, stomach pain, nausea or vomiting, diarrhea, constipation, bowel incontinence Gu:  Denies bladder incontinence, burning urine Ext:   No Joint pain, stiffness or swelling Skin: No skin rash, easy bruising or bleeding or hives Endoc:  No polyuria, polydipsia , polyphagia or weight change Other:  All other systems negative  Allergies:  Erythromycin  Physical Examination:  VS: BP (!) 142/88 (BP Location: Left Arm, Cuff Size: Normal)   Pulse 92   Wt 274 lb (124.3 kg)   SpO2 93%   BMI 42.91 kg/m   General Appearance: No distress  HEENT: PERRLA, no ptosis, no other lesions noticed. No bruits Pulmonary:normal breath sounds., diaphragmatic excursion normal.No  wheezing, No rales   Cardiovascular:  Normal S1,S2.  No m/r/g.     Abdomen:Exam: Benign, Soft, non-tender, No masses  Skin:   warm, no rashes, no ecchymosis  Extremities: normal, no cyanosis, clubbing, warm with normal capillary refill.     (The following images and results were reviewed by Dr. Dema SeverinMungal on 06/15/2016). CT Chest 05/04/16 FINDINGS: Cardiovascular: Scattered aortic hand coronary artery calcifications. Stable calcification within the left ventricle. The Mediastinum/Nodes: Pretracheal lymph node has a short axis diameter of 9 mm compared with 11 mm previously. Other smaller scattered mediastinal lymph nodes. No axillary adenopathy or visible hilar adenopathy. Lungs/Pleura: Linear areas of scarring in the lingula and both lower lobes at the lung bases. Bochdalek's hernia noted on the right containing fat, stable. No confluent airspace opacities otherwise. No effusions. Upper Abdomen: Imaging into the upper abdomen shows no acute findings. Musculoskeletal: Chest wall soft tissues are unremarkable. No acute bony abnormality or focal bone lesion. IMPRESSION: Areas of linear scarring in the lung bases bilaterally. Right Bochdalek's type diaphoretic hernia containing fat, stable. Scattered coronary artery and aortic calcifications. No acute findings.  LABS Absolute Eosinophil count - 0.1  Assessment and Plan: 70 year old female with chronic cough, recurrent URIs, being treated as ABPA/chronic eosinophilic pneumonia, unable to wean her steroids, now with an acute exacerbation of her ABPA  ABPA (allergic bronchopulmonary aspergillosis) Again this is a working diagnosis, other differentials include hypersensitivity pneumonitis, eosinophilic pneumonias, NSIP, BOOP. All of these diagnoses are treated with high-dose steroids for a prolonged period of time. She does have significant clinical improvement once steroids were initiated, with rapid decline when steroids are terminated. Her  further workup with Aspergillus skin testing, IgG  levels, IgA levels were within normal limits.  The results of the skin testing and immunoglobulin testing were discussed in detail with the patient, they reviewed by me personally, and the patient was given a copy of her results.  Patient educated on side effects of steroids (weight gain, wereretention, increased appetite, muscle weakness, bone degradation, dysfunctional calcium levels, elevated blood glucose levels, anxiety/agitation at night, decreased immune).  Will continue to taper steroid doses to the lowest possible dose that will provide symptomatic relief, the premise is to completely taper steroids off. Given her negative Aspergillus skin testing (this was done while patient on high-dose steroids, which could also give a false negative read), at some point in the future if steroids can be tapered off or patient has relapse of symptoms. Patient educated on the current diagnosis, she understands that this is not an active infection, it is most likely a hyperreactive reaction to antigen exposure. She does not need/require antifungal treatment at this time. This is her initial treatment course, if she has refractory or recurrent symptoms will consider adding antifungals at that time.  She is now status post  Bronchoscopy 09/2015: All cultures including fungal, bacterial, AFB a currently negative to date. Transbronchial biopsy showed eosinophilic infiltration consistent with eosinophilic pneumonia  Plan:  -reduce prednisone to 10mg  daily until follow up visit. If symptoms returns continuously for 3 or more days then return to prednisone 12.5mg  daily until follow up visit.  Try to wean prednisone at next visit.  Continue allergy regiment Continue Advair Continue allergen avoidance  avoid sick contacts     EP (eosinophilic pneumonia) (HCC)  Chronic eosinophilic pneumonia   -diagnosed by history, symptoms, transbronchial biopsy and  bronchoscopy on 09/24/2015   At prior visit patient stated that she has a hx of nasal polyps, rhinosinusitis and has been on [facility] powder for a number of years.   Discussed cessation of BC powder and all forms of aspirin with the patient, currently with mild improvement, but her prednisone dose has also increased In the interm we will consider anti-IgE therapy (Xolair) or Anti-IL5 therapy (Nucala) workup, given her difficult to control asthma and need for chronic steroid.   Plan: -Prednisone 10 mg daily until follow up.  Avoid any allergens Continue Advair Continue allergy regiment and allergy shot Avoid sick contacts. Stopped BC powder and all forms of Aspirin May need to consider Xolair if unable to wean  CBC with diff and absolute eosinophil count before next visit. 2 view cxr prior to follow up visit.    Updated Medication List Outpatient Encounter Prescriptions as of 06/15/2016  Medication Sig  . albuterol (ACCUNEB) 0.63 MG/3ML nebulizer solution Take 1 ampule by nebulization every 6 (six) hours as needed.    . B Complex-C-Folic Acid (STRESS B COMPLEX PO) Take 1 tablet by mouth daily.  . Calcium Carbonate-Vitamin D (CALTRATE 600+D PO) Take 1 tablet by mouth 2 (two) times daily.  Marland Kitchen co-enzyme Q-10 30 MG capsule Take 200 mg by mouth daily.  Marland Kitchen EPIPEN 2-PAK 0.3 MG/0.3ML SOAJ injection See admin instructions.  . fexofenadine (ALLEGRA ALLERGY) 60 MG tablet Take 60 mg by mouth daily.   . Fluticasone-Salmeterol (ADVAIR DISKUS) 250-50 MCG/DOSE AEPB Inhale 1 puff into the lungs 2 (two) times daily.   . furosemide (LASIX) 20 MG tablet TAKE 1 TABLET (20 MG TOTAL) BY MOUTH DAILY.  Marland Kitchen GARLIC PO Take 1 capsule by mouth daily.  Marland Kitchen glucose blood (ONE TOUCH ULTRA TEST) test strip USE TO TEST TWICE DAILY Dx  E09.9  . levothyroxine (SYNTHROID, LEVOTHROID) 50 MCG tablet Take 1 tablet (50 mcg total) by mouth daily.  . montelukast (SINGULAIR) 10 MG tablet Take 1 tablet (10 mg total) by mouth daily.  . Multiple  Vitamins-Minerals (CENTRUM ADULTS PO) Take 1 tablet by mouth daily.  Marland Kitchen omeprazole (PRILOSEC) 40 MG capsule Take 1 capsule (40 mg total) by mouth daily. (Patient taking differently: Take 20 mg by mouth daily. )  . ONETOUCH DELICA LANCETS 33G MISC TEST SUGARS TWICE DAILY Dx E09.9  . predniSONE (DELTASONE) 5 MG tablet Take 2.5 tablets (12.5 mg total) by mouth daily with breakfast. Until follow up.  . traMADol (ULTRAM) 50 MG tablet Take 1 tablet (50 mg total) by mouth every 12 (twelve) hours as needed for severe pain (with caution of sedation).  . VENTOLIN HFA 108 (90 Base) MCG/ACT inhaler TAKE 1 TO 2 PUFFS EVERY 6 HOURS AS NEEDED   No facility-administered encounter medications on file as of 06/15/2016.     Orders for this visit: Orders Placed This Encounter  Procedures  . CBC w/Diff    Standing Status:   Future    Standing Expiration Date:   06/15/2017  . Eosinophil count    ABSOLUTE EISONPHIL    Standing Status:   Future    Standing Expiration Date:   06/15/2017    Thank  you for the visitation and for allowing  [facility] to assist in the care of your patient. Our recommendations are noted above.  Please contact us if we can be of further service.  Stephanie Acre, MD [facility] Office Number: [phone]

## 2016-06-15 NOTE — Assessment & Plan Note (Signed)
Again this is a working diagnosis, other differentials include hypersensitivity pneumonitis, eosinophilic pneumonias, NSIP, BOOP. All of these diagnoses are treated with high-dose steroids for a prolonged period of time. She does have significant clinical improvement once steroids were initiated, with rapid decline when steroids are terminated. Her further workup with Aspergillus skin testing, IgG levels, IgA levels were within normal limits.  The results of the skin testing and immunoglobulin testing were discussed in detail with the patient, they reviewed by me personally, and the patient was given a copy of her results.  Patient educated on side effects of steroids (weight gain, wereretention, increased appetite, muscle weakness, bone degradation, dysfunctional calcium levels, elevated blood glucose levels, anxiety/agitation at night, decreased immune).  Will continue to taper steroid doses to the lowest possible dose that will provide symptomatic relief, the premise is to completely taper steroids off. Given her negative Aspergillus skin testing (this was done while patient on high-dose steroids, which could also give a false negative read), at some point in the future if steroids can be tapered off or patient has relapse of symptoms. Patient educated on the current diagnosis, she understands that this is not an active infection, it is most likely a hyperreactive reaction to antigen exposure. She does not need/require antifungal treatment at this time. This is her initial treatment course, if she has refractory or recurrent symptoms will consider adding antifungals at that time.  She is now status post  Bronchoscopy 09/2015: All cultures including fungal, bacterial, AFB a currently negative to date. Transbronchial biopsy showed eosinophilic infiltration consistent with eosinophilic pneumonia  Plan:  -reduce prednisone to 10mg  daily until follow up visit. If symptoms returns continuously for 3 or  more days then return to prednisone 12.5mg  daily until follow up visit.  Try to wean prednisone at next visit.  Continue allergy regiment Continue Advair Continue allergen avoidance  avoid sick contacts

## 2016-06-15 NOTE — Patient Instructions (Addendum)
Follow up with Dr. Dema SeverinMungal in:4-6 weeks - reduce prednisone to 10mg  daily until follow up visit - If symptoms returns continuously for 3 or more days then return to prednisone 12.5mg  daily until follow up visit.  - continue with your current inhalers - Diet and exercise as tolerated - CBC with diff and absolute eosinophil count before next visit.\ - 2 view CXR prior to follow up visit.

## 2016-06-22 DIAGNOSIS — J301 Allergic rhinitis due to pollen: Secondary | ICD-10-CM | POA: Diagnosis not present

## 2016-06-23 ENCOUNTER — Ambulatory Visit: Payer: Medicare Other | Admitting: Internal Medicine

## 2016-06-26 DIAGNOSIS — J301 Allergic rhinitis due to pollen: Secondary | ICD-10-CM | POA: Diagnosis not present

## 2016-07-06 DIAGNOSIS — J301 Allergic rhinitis due to pollen: Secondary | ICD-10-CM | POA: Diagnosis not present

## 2016-07-13 DIAGNOSIS — J301 Allergic rhinitis due to pollen: Secondary | ICD-10-CM | POA: Diagnosis not present

## 2016-07-14 ENCOUNTER — Ambulatory Visit (INDEPENDENT_AMBULATORY_CARE_PROVIDER_SITE_OTHER): Payer: Medicare Other

## 2016-07-14 DIAGNOSIS — Z23 Encounter for immunization: Secondary | ICD-10-CM

## 2016-07-20 DIAGNOSIS — J301 Allergic rhinitis due to pollen: Secondary | ICD-10-CM | POA: Diagnosis not present

## 2016-07-23 ENCOUNTER — Other Ambulatory Visit: Payer: Self-pay | Admitting: Family Medicine

## 2016-07-24 DIAGNOSIS — J45991 Cough variant asthma: Secondary | ICD-10-CM | POA: Diagnosis not present

## 2016-07-26 ENCOUNTER — Other Ambulatory Visit
Admit: 2016-07-26 | Discharge: 2016-07-26 | Disposition: A | Discharge status: Home / Self Care | Payer: Medicare Other | Source: Other Acute Inpatient Hospital | Attending: Internal Medicine | Admitting: Internal Medicine

## 2016-07-26 DIAGNOSIS — J301 Allergic rhinitis due to pollen: Secondary | ICD-10-CM | POA: Diagnosis not present

## 2016-07-26 DIAGNOSIS — B4481 Allergic bronchopulmonary aspergillosis: Secondary | ICD-10-CM

## 2016-07-26 DIAGNOSIS — J82 Pulmonary eosinophilia, not elsewhere classified: Secondary | ICD-10-CM | POA: Diagnosis not present

## 2016-07-26 DIAGNOSIS — J8281 Chronic eosinophilic pneumonia: Secondary | ICD-10-CM

## 2016-07-26 LAB — CBC WITH DIFFERENTIAL/PLATELET
BASOS PCT: 1 %
Basophils Absolute: 0 10*3/uL (ref 0–0.1)
EOS ABS: 0.2 10*3/uL (ref 0–0.7)
Eosinophils Relative: 3 %
HCT: 44.9 % (ref 35.0–47.0)
HEMOGLOBIN: 14.8 g/dL (ref 12.0–16.0)
Lymphocytes Relative: 18 %
Lymphs Abs: 1.4 10*3/uL (ref 1.0–3.6)
MCH: 29.5 pg (ref 26.0–34.0)
MCHC: 33 g/dL (ref 32.0–36.0)
MCV: 89.4 fL (ref 80.0–100.0)
MONOS PCT: 5 %
Monocytes Absolute: 0.4 10*3/uL (ref 0.2–0.9)
NEUTROS PCT: 73 %
Neutro Abs: 5.9 10*3/uL (ref 1.4–6.5)
Platelets: 213 10*3/uL (ref 150–440)
RBC: 5.02 MIL/uL (ref 3.80–5.20)
RDW: 13.6 % (ref 11.5–14.5)
WBC: 8 10*3/uL (ref 3.6–11.0)

## 2016-08-01 ENCOUNTER — Ambulatory Visit (INDEPENDENT_AMBULATORY_CARE_PROVIDER_SITE_OTHER): Payer: Medicare Other | Admitting: Internal Medicine

## 2016-08-01 ENCOUNTER — Encounter: Payer: Self-pay | Admitting: Internal Medicine

## 2016-08-01 VITALS — BP 144/84 | HR 91 | Ht 67.0 in | Wt 274.0 lb

## 2016-08-01 DIAGNOSIS — J4540 Moderate persistent asthma, uncomplicated: Secondary | ICD-10-CM

## 2016-08-01 DIAGNOSIS — B4481 Allergic bronchopulmonary aspergillosis: Secondary | ICD-10-CM | POA: Diagnosis not present

## 2016-08-01 MED ORDER — PREDNISONE 5 MG PO TABS: 12.5000 mg | ORAL_TABLET | Freq: Every day | ORAL | 1 refills | Status: DC

## 2016-08-01 NOTE — Assessment & Plan Note (Signed)
Again this is a working diagnosis, other differentials include hypersensitivity pneumonitis, eosinophilic pneumonias, NSIP, BOOP. All of these diagnoses are treated with high-dose steroids for a prolonged period of time. She does have significant clinical improvement once steroids were initiated, with rapid decline when steroids are terminated. Her further workup with Aspergillus skin testing, IgG levels, IgA levels were within normal limits.  The results of the skin testing and immunoglobulin testing were discussed in detail with the patient, they reviewed by me personally, and the patient was given a copy of her results.  Patient educated on side effects of steroids (weight gain, wereretention, increased appetite, muscle weakness, bone degradation, dysfunctional calcium levels, elevated blood glucose levels, anxiety/agitation at night, decreased immune).  Will continue to taper steroid doses to the lowest possible dose that will provide symptomatic relief, the premise is to completely taper steroids off. Given her negative Aspergillus skin testing (this was done while patient on high-dose steroids, which could also give a false negative read), at some point in the future if steroids can be tapered off or patient has relapse of symptoms we will repeat skin testing.  Patient educated on the current diagnosis, she understands that this is not an active infection, it is most likely a hyperreactive reaction to antigen exposure. She does not need/require antifungal treatment at this time. This is her initial treatment course, if she has refractory or recurrent symptoms will consider adding antifungals at that time.  Bronchoscopy 09/2015: All cultures including fungal, bacterial, AFB a currently negative to date. Transbronchial biopsy showed eosinophilic infiltration consistent with eosinophilic pneumonia in 2016 Bronhoscopy with BAL 03/2016: negative cultures.   Plan:  - prednisone 12.5mg daily until  follow up visit.  Try to wean prednisone at next visit.  Continue allergy regiment Continue Advair Continue allergen avoidance  avoid sick contacts Xolair initiation   

## 2016-08-01 NOTE — Patient Instructions (Addendum)
Follow up with Dr. Dema SeverinMungal in:4 weeks - cont with 12.5mg  Prednisone daily - we will start you on Xolair - cont with current inhalers - please start back with diet and exercise.    Omalizumab injection What is this medicine? OMALIZUMAB (oh mah lye ZOO mab) is used to help treat allergic asthma and other allergies. It may be given with other treatments for these conditions. This medicine may be used for other purposes; ask your health care provider or pharmacist if you have questions. What should I tell my health care provider before I take this medicine? They need to know if you have any of these conditions: -lymphoma or other cancer -an unusual or allergic reaction to omalizumab, hamster proteins, other medicines, foods, dyes, or preservatives -pregnant or trying to get pregnant -breast-feeding How should I use this medicine? This medicine is for injection under the skin. It is given by a health care professional in a hospital or clinic setting. A special MedGuide will be given to you with each injection. Be sure to read this information carefully each time. Talk to your pediatrician regarding the use of this medicine in children. While this drug may be prescribed for children as young as 6 years for selected conditions, precautions do apply. Overdosage: If you think you have taken too much of this medicine contact a poison control center or emergency room at once. NOTE: This medicine is only for you. Do not share this medicine with others. What if I miss a dose? It is important not to miss your dose. This medicine is usually given every 2 to 4 weeks. Call your doctor or health care professional if you are unable to keep an appointment. What may interact with this medicine? Interactions are not expected. This list may not describe all possible interactions. Give your health care provider a list of all the medicines, herbs, non-prescription drugs, or dietary supplements you use. Also tell  them if you smoke, drink alcohol, or use illegal drugs. Some items may interact with your medicine. What should I watch for while using this medicine? After receiving each injection, you will be monitored for a short time in your doctor's office or clinic. Improvement of your asthma or allergies will not be immediate. Improvements will occur gradually. You will have regular blood tests to determine how often you will need this medicine. Do not stop taking any of your other asthma or allergy treatments unless otherwise directed by your doctor. What side effects may I notice from receiving this medicine? Side effects that you should report to your doctor or health care professional as soon as possible: -allergic reactions like skin rash, itching or hives, swelling of the face, lips, or tongue -breathing problems -pain, tingling, numbness in the hands or feet -unusual bleeding or bruising Side effects that usually do not require medical attention (report to your doctor or health care professional if they continue or are bothersome): -headache -mild redness, itching, swelling, or bruising at the injection site -nausea This list may not describe all possible side effects. Call your doctor for medical advice about side effects. You may report side effects to FDA at 1-800-FDA-1088. Where should I keep my medicine? This drug is given in a hospital or clinic and will not be stored at home. NOTE: This sheet is a summary. It may not cover all possible information. If you have questions about this medicine, talk to your doctor, pharmacist, or health care provider.    2016, [employer]. (2015-04-22 19:17:17)

## 2016-08-01 NOTE — Assessment & Plan Note (Signed)
Recurrent exacerbation during the first half of 2015 with elevated serum eosinophils and abnormal CT  Aspergillus IgE panel neg w/ low positive titers for Asp Nidulans, Flavus and Terreus and amstel.  She has an elevated total IgE level, CT findings with mild bronchiectasis, groundglass opacities-given her history of asthma and his current lab and radiographic abnormalities there is a high suspicion for ABPA for which she has been on steroids for during the past 2 years, with inability to wean.    urrently we are trying to taper her prednisone dose to the lowest possible effective dose that gives her the greatest clinical outcome. Patient got down to 7.5 mg every other day at one point, but is still having moderate amount of symptoms of cough, runny nose, sputum production, and shortness of breath. At today's visit she her prednisone is 12.5 mg daily  Eosinophil % =3 IgE=1180 Today we discussed the addition of Xolair to her current Asthma regimen, for which she is in agreement with.    Plan - Continue Advair as directed - prednisone 12.5 mg daily until follow up visit -start Xolair.

## 2016-08-01 NOTE — Progress Notes (Signed)
MRN# [id number] Rachael Silva 20-Jul-1946   CC: Chief Complaint  Patient presents with  . Follow-up    sick first of this month; feels better now; SOB some w/exertion   synopsis: 70 year old female past medical history of asthma. Being followed by pulmonary for chronic eosinophilic pneumonia, status post bronchoscopy in December 2016 with negative cultures for AFB and fungus. Transbronchial biopsy-positive for eosinophilic infiltrate. Treated with steroids unable to wean since  October 2015.   Events since last clinic visit: Patient presents today for follow-up visit of her ABPA, asthma, chronic prednisone use secondary to lung disease. Patient states today that she is doing well, she is currently on prednisone 12.5 mg daily, and receiving allergy shots once weekly. Since her last visit she's not had any further exacerbations of eosinophilic pneumonia or ABPA. She does not endorse any worsening shortness of breath, sputum production, fever, chills. Today we discussed the inability to wean down Prednisone, and the initiation of Xolair.    Review of Systems: Gen:  Denies  fever, sweats, chills HEENT: Denies blurred vision, double vision, ear pain, eye pain, hearing loss, nose bleeds, sore throat Cvc:  No dizziness, chest pain or heaviness Resp:   Admits NW:GNFAOto:cough, DOE (stable), nasal congestion Gi: Denies swallowing difficulty, stomach pain, nausea or vomiting, diarrhea, constipation, bowel incontinence Gu:  Denies bladder incontinence, burning urine Ext:   No Joint pain, stiffness or swelling Skin: No skin rash, easy bruising or bleeding or hives Endoc:  No polyuria, polydipsia , polyphagia or weight change Other:  All other systems negative  Allergies:  Erythromycin  Physical Examination:  VS: BP (!) 144/84 (BP Location: Left Arm, Cuff Size: Normal)   Pulse 91   Ht 5\' 7"  (1.702 m)   Wt 274 lb (124.3 kg)   SpO2 95%   BMI 42.91 kg/m    General Appearance: No distress  HEENT:  PERRLA, no ptosis, no other lesions noticed. No bruits Pulmonary:normal breath sounds., diaphragmatic excursion normal.No wheezing, No rales   Cardiovascular:  Normal S1,S2.  No m/r/g.     Abdomen:Exam: Benign, Soft, non-tender, No masses  Skin:   warm, no rashes, no ecchymosis  Extremities: normal, no cyanosis, clubbing, warm with normal capillary refill.     (The following images and results were reviewed by Dr. Dema SeverinMungal on 08/01/2016). CT Chest 05/04/16 FINDINGS: Cardiovascular: Scattered aortic hand coronary artery calcifications. Stable calcification within the left ventricle. The Mediastinum/Nodes: Pretracheal lymph node has a short axis diameter of 9 mm compared with 11 mm previously. Other smaller scattered mediastinal lymph nodes. No axillary adenopathy or visible hilar adenopathy. Lungs/Pleura: Linear areas of scarring in the lingula and both lower lobes at the lung bases. Bochdalek's hernia noted on the right containing fat, stable. No confluent airspace opacities otherwise. No effusions. Upper Abdomen: Imaging into the upper abdomen shows no acute findings. Musculoskeletal: Chest wall soft tissues are unremarkable. No acute bony abnormality or focal bone lesion. IMPRESSION: Areas of linear scarring in the lung bases bilaterally. Right Bochdalek's type diaphoretic hernia containing fat, stable. Scattered coronary artery and aortic calcifications. No acute findings.  LABS Results for Rachael Silva, Rachael Silva (MRN [id number]) as of 08/01/2016 11:16  Ref. Range 07/02/2014 13:00 08/05/2015 15:12 03/30/2016 15:11  IgE (Immunoglobulin E), Serum Latest Ref Range: 0 - 100 IU/mL 2,401 (H) 1,253 (H) 1,180 (H)   Results for Rachael Silva, Rachael Silva (MRN [id number]) as of 08/01/2016 11:16  Ref. Range 08/05/2015 15:12 09/16/2015 12:46 03/30/2016 15:11 05/03/2016 08:42 07/26/2016 15:31  Eosinophil Latest Units: % 10 11 5  5.4 (H) 3   Assessment and Plan: 70 year old female with chronic cough, recurrent URIs,  being treated as ABPA/chronic eosinophilic pneumonia, unable to wean her steroids, now with starting Xolair.  Asthma, chronic Recurrent exacerbation during the first half of 2015 with elevated serum eosinophils and abnormal CT  Aspergillus IgE panel neg w/ low positive titers for Asp Nidulans, Flavus and Terreus and amstel.  She has an elevated total IgE level, CT findings with mild bronchiectasis, groundglass opacities-given her history of asthma and his current lab and radiographic abnormalities there is a high suspicion for ABPA for which she has been on steroids for during the past 2 years, with inability to wean.    urrently we are trying to taper her prednisone dose to the lowest possible effective dose that gives her the greatest clinical outcome. Patient got down to 7.5 mg every other day at one point, but is still having moderate amount of symptoms of cough, runny nose, sputum production, and shortness of breath. At today's visit she her prednisone is 12.5 mg daily  Eosinophil % =3 IgE=1180 Today we discussed the addition of Xolair to her current Asthma regimen, for which she is in agreement with.    Plan - Continue Advair as directed - prednisone 12.5 mg daily until follow up visit -start Xolair.              ABPA (allergic bronchopulmonary aspergillosis) Again this is a working diagnosis, other differentials include hypersensitivity pneumonitis, eosinophilic pneumonias, NSIP, BOOP. All of these diagnoses are treated with high-dose steroids for a prolonged period of time. She does have significant clinical improvement once steroids were initiated, with rapid decline when steroids are terminated. Her further workup with Aspergillus skin testing, IgG levels, IgA levels were within normal limits.  The results of the skin testing and immunoglobulin testing were discussed in detail with the patient, they reviewed by me personally, and the patient was given a copy of her  results.  Patient educated on side effects of steroids (weight gain, wereretention, increased appetite, muscle weakness, bone degradation, dysfunctional calcium levels, elevated blood glucose levels, anxiety/agitation at night, decreased immune).  Will continue to taper steroid doses to the lowest possible dose that will provide symptomatic relief, the premise is to completely taper steroids off. Given her negative Aspergillus skin testing (this was done while patient on high-dose steroids, which could also give a false negative read), at some point in the future if steroids can be tapered off or patient has relapse of symptoms we will repeat skin testing. Patient educated on the current diagnosis, she understands that this is not an active infection, it is most likely a hyperreactive reaction to antigen exposure. She does not need/require antifungal treatment at this time. This is her initial treatment course, if she has refractory or recurrent symptoms will consider adding antifungals at that time.  Bronchoscopy 09/2015: All cultures including fungal, bacterial, AFB a currently negative to date. Transbronchial biopsy showed eosinophilic infiltration consistent with eosinophilic pneumonia in 2016 Bronhoscopy with BAL 03/2016: negative cultures.   Plan:  - prednisone 12.5mg  daily until follow up visit.  Try to wean prednisone at next visit.  Continue allergy regiment Continue Advair Continue allergen avoidance  avoid sick contacts Xolair initiation     Updated Medication List Outpatient Encounter Prescriptions as of 08/01/2016  Medication Sig  . albuterol (ACCUNEB) 0.63 MG/3ML nebulizer solution Take 1 ampule by nebulization every 6 (six) hours as needed.    . B Complex-C-Folic Acid (STRESS B  COMPLEX PO) Take 1 tablet by mouth daily.  . Calcium Carbonate-Vitamin D (CALTRATE 600+D PO) Take 1 tablet by mouth 2 (two) times daily.  Marland Kitchen co-enzyme Q-10 30 MG capsule Take 200 mg by mouth daily.   Marland Kitchen EPIPEN 2-PAK 0.3 MG/0.3ML SOAJ injection See admin instructions.  . fexofenadine (ALLEGRA ALLERGY) 60 MG tablet Take 60 mg by mouth daily.   . Fluticasone-Salmeterol (ADVAIR DISKUS) 250-50 MCG/DOSE AEPB Inhale 1 puff into the lungs 2 (two) times daily.   . furosemide (LASIX) 20 MG tablet TAKE 1 TABLET (20 MG TOTAL) BY MOUTH DAILY.  Marland Kitchen GARLIC PO Take 1 capsule by mouth daily.  Marland Kitchen glucose blood (ONE TOUCH ULTRA TEST) test strip USE TO TEST TWICE DAILY Dx E09.9  . levothyroxine (SYNTHROID, LEVOTHROID) 50 MCG tablet Take 1 tablet (50 mcg total) by mouth daily.  . montelukast (SINGULAIR) 10 MG tablet Take 1 tablet (10 mg total) by mouth daily.  . Multiple Vitamins-Minerals (CENTRUM ADULTS PO) Take 1 tablet by mouth daily.  Marland Kitchen omeprazole (PRILOSEC) 40 MG capsule Take 1 capsule (40 mg total) by mouth daily. (Patient taking differently: Take 20 mg by mouth daily. )  . ONETOUCH DELICA LANCETS 33G MISC TEST SUGARS TWICE DAILY Dx E09.9  . predniSONE (DELTASONE) 5 MG tablet Take 2.5 tablets (12.5 mg total) by mouth daily with breakfast. Until follow up.  . traMADol (ULTRAM) 50 MG tablet Take 1 tablet (50 mg total) by mouth every 12 (twelve) hours as needed for severe pain (with caution of sedation).  . VENTOLIN HFA 108 (90 Base) MCG/ACT inhaler TAKE 1 TO 2 PUFFS EVERY 6 HOURS AS NEEDED  . [DISCONTINUED] predniSONE (DELTASONE) 5 MG tablet Take 2.5 tablets (12.5 mg total) by mouth daily with breakfast. Until follow up.   No facility-administered encounter medications on file as of 08/01/2016.     Orders for this visit: No orders of the defined types were placed in this encounter.   Thank  you for the visitation and for allowing  [facility] to assist in the care of your patient. Our recommendations are noted above.  Please contact us if we can be of further service.  Stephanie Acre, MD [facility] Office Number: [phone]

## 2016-08-03 DIAGNOSIS — J301 Allergic rhinitis due to pollen: Secondary | ICD-10-CM | POA: Diagnosis not present

## 2016-08-07 ENCOUNTER — Other Ambulatory Visit: Payer: Self-pay | Admitting: Family Medicine

## 2016-08-08 ENCOUNTER — Other Ambulatory Visit: Payer: Self-pay | Admitting: Family Medicine

## 2016-08-09 ENCOUNTER — Other Ambulatory Visit: Payer: Self-pay | Admitting: Family Medicine

## 2016-08-10 DIAGNOSIS — J301 Allergic rhinitis due to pollen: Secondary | ICD-10-CM | POA: Diagnosis not present

## 2016-08-11 MED ORDER — LEVOTHYROXINE SODIUM 50 MCG PO TABS: 50.0000 ug | ORAL_TABLET | Freq: Every day | ORAL | 2 refills | Status: DC

## 2016-08-11 NOTE — Addendum Note (Signed)
Addended by: Shon MilletWATLINGTON, Janiel Derhammer M on: 08/11/2016 12:43 PM   Modules accepted: Orders

## 2016-08-17 DIAGNOSIS — J301 Allergic rhinitis due to pollen: Secondary | ICD-10-CM | POA: Diagnosis not present

## 2016-08-18 DIAGNOSIS — J301 Allergic rhinitis due to pollen: Secondary | ICD-10-CM | POA: Diagnosis not present

## 2016-08-24 DIAGNOSIS — J301 Allergic rhinitis due to pollen: Secondary | ICD-10-CM | POA: Diagnosis not present

## 2016-08-28 DIAGNOSIS — J301 Allergic rhinitis due to pollen: Secondary | ICD-10-CM | POA: Diagnosis not present

## 2016-09-06 ENCOUNTER — Ambulatory Visit (INDEPENDENT_AMBULATORY_CARE_PROVIDER_SITE_OTHER): Payer: Medicare Other | Admitting: Internal Medicine

## 2016-09-06 ENCOUNTER — Encounter: Payer: Self-pay | Admitting: Internal Medicine

## 2016-09-06 VITALS — BP 140/88 | HR 97 | Wt 280.0 lb

## 2016-09-06 DIAGNOSIS — J82 Pulmonary eosinophilia, not elsewhere classified: Secondary | ICD-10-CM | POA: Diagnosis not present

## 2016-09-06 DIAGNOSIS — J8281 Chronic eosinophilic pneumonia: Secondary | ICD-10-CM

## 2016-09-06 DIAGNOSIS — B4481 Allergic bronchopulmonary aspergillosis: Secondary | ICD-10-CM | POA: Diagnosis not present

## 2016-09-06 DIAGNOSIS — J4540 Moderate persistent asthma, uncomplicated: Secondary | ICD-10-CM

## 2016-09-06 DIAGNOSIS — Z9989 Dependence on other enabling machines and devices: Secondary | ICD-10-CM

## 2016-09-06 DIAGNOSIS — G4733 Obstructive sleep apnea (adult) (pediatric): Secondary | ICD-10-CM | POA: Diagnosis not present

## 2016-09-06 NOTE — Assessment & Plan Note (Signed)
Chronic eosinophilic pneumonia   -diagnosed by history, symptoms, transbronchial biopsy and bronchoscopy on 09/24/2015   At prior visit patient stated that she has a hx of nasal polyps, rhinosinusitis and [facility].   Discussed cessation of BC powder and all forms of aspirin with the patient, currently with mild improvement, but her prednisone dose has also increased In the interm we will consider anti-IgE therapy (Xolair) or Anti-IL5 therapy (Nucala) workup, given her difficult to control asthma and need for chronic steroid.   Plan: -Prednisone 10 mg daily until follow up.  Avoid any allergens Continue Advair Continue allergy regiment and allergy shot Avoid sick contacts. Stopped BC powder and all forms of Aspirin Currently awaiting approval of Xolair

## 2016-09-06 NOTE — Assessment & Plan Note (Signed)
Again this is a working diagnosis, other differentials include hypersensitivity pneumonitis, eosinophilic pneumonias, NSIP, BOOP. All of these diagnoses are treated with high-dose steroids for a prolonged period of time. She does have significant clinical improvement once steroids were initiated, with rapid decline when steroids are terminated. Her further workup with Aspergillus skin testing, IgG levels, IgA levels were within normal limits.  The results of the skin testing and immunoglobulin testing were discussed in detail with the patient, they reviewed by me personally, and the patient was given a copy of her results.  Patient educated on side effects of steroids (weight gain, wereretention, increased appetite, muscle weakness, bone degradation, dysfunctional calcium levels, elevated blood glucose levels, anxiety/agitation at night, decreased immune).  Will continue to taper steroid doses to the lowest possible dose that will provide symptomatic relief, the premise is to completely taper steroids off. Given her negative Aspergillus skin testing (this was done while patient on high-dose steroids, which could also give a false negative read), at some point in the future if steroids can be tapered off or patient has relapse of symptoms we will repeat skin testing.  Patient educated on the current diagnosis, she understands that this is not an active infection, it is most likely a hyperreactive reaction to antigen exposure. She does not need/require antifungal treatment at this time. This is her initial treatment course, if she has refractory or recurrent symptoms will consider adding antifungals at that time.  Bronchoscopy 09/2015: All cultures including fungal, bacterial, AFB a currently negative to date. Transbronchial biopsy showed eosinophilic infiltration consistent with eosinophilic pneumonia in 2016 Bronhoscopy with BAL 03/2016: negative cultures.   Plan:  - prednisone 12.5mg  daily until  follow up visit.  Try to wean prednisone at next visit.  Continue allergy regiment Continue Advair Continue allergen avoidance  avoid sick contacts Xolair initiation

## 2016-09-06 NOTE — Progress Notes (Signed)
MRN# [id number] Rachael Silva 03-06-1946   CC: Chief Complaint  Patient presents with  . Follow-up    Prednisone 12.5mg  : prod cough w/yellow; SOB w/activity   synopsis: 70 year old female past medical history of asthma. Being followed by pulmonary for chronic eosinophilic pneumonia, status post bronchoscopy in December 2016 with negative cultures for AFB and fungus. Transbronchial biopsy-positive for eosinophilic infiltrate. Treated with steroids unable to wean since  October 2015.   Events since last clinic visit: Patient presents today for follow-up visit of her ABPA, asthma, chronic prednisone use secondary to lung disease. Patient states today that she is doing well, she is currently on prednisone 12.5 mg daily, and receiving allergy shots once weekly. Since her last visit she's not had any further exacerbations of eosinophilic pneumonia or ABPA. She does not endorse any worsening shortness of breath, sputum production, fever, chills. Today we discussed the inability to wean down Prednisone, and the initiation of Xolair.    Review of Systems: Gen:  Denies  fever, sweats, chills HEENT: Denies blurred vision, double vision, ear pain, eye pain, hearing loss, nose bleeds, sore throat Cvc:  No dizziness, chest pain or heaviness Resp:   Admits WU:XLKGMto:cough, DOE (stable), nasal congestion Gi: Denies swallowing difficulty, stomach pain, nausea or vomiting, diarrhea, constipation, bowel incontinence Gu:  Denies bladder incontinence, burning urine Ext:   No Joint pain, stiffness or swelling Skin: No skin rash, easy bruising or bleeding or hives Endoc:  No polyuria, polydipsia , polyphagia or weight change Other:  All other systems negative  Allergies:  Erythromycin  Physical Examination:  VS: BP 140/88 (BP Location: Left Arm, Cuff Size: Normal)   Pulse 97   Wt 280 lb (127 kg)   SpO2 100%   BMI 43.85 kg/m   General Appearance: No distress  HEENT: PERRLA, no ptosis, no other lesions  noticed. No bruits Pulmonary:normal breath sounds., diaphragmatic excursion normal.No wheezing, No rales   Cardiovascular:  Normal S1,S2.  No m/r/g.     Abdomen:Exam: Benign, Soft, non-tender, No masses  Skin:   warm, no rashes, no ecchymosis  Extremities: normal, no cyanosis, clubbing, warm with normal capillary refill.     (The following images and results were reviewed by Dr. Dema SeverinMungal on 09/06/2016). CT Chest 05/04/16 FINDINGS: Cardiovascular: Scattered aortic hand coronary artery calcifications. Stable calcification within the left ventricle. The Mediastinum/Nodes: Pretracheal lymph node has a short axis diameter of 9 mm compared with 11 mm previously. Other smaller scattered mediastinal lymph nodes. No axillary adenopathy or visible hilar adenopathy. Lungs/Pleura: Linear areas of scarring in the lingula and both lower lobes at the lung bases. Bochdalek's hernia noted on the right containing fat, stable. No confluent airspace opacities otherwise. No effusions. Upper Abdomen: Imaging into the upper abdomen shows no acute findings. Musculoskeletal: Chest wall soft tissues are unremarkable. No acute bony abnormality or focal bone lesion. IMPRESSION: Areas of linear scarring in the lung bases bilaterally. Right Bochdalek's type diaphoretic hernia containing fat, stable. Scattered coronary artery and aortic calcifications. No acute findings.  LABS Results for Rachael Silva, Rachael Silva (MRN [id number]) as of 08/01/2016 11:16  Ref. Range 07/02/2014 13:00 08/05/2015 15:12 03/30/2016 15:11  IgE (Immunoglobulin E), Serum Latest Ref Range: 0 - 100 IU/mL 2,401 (H) 1,253 (H) 1,180 (H)   Results for Rachael Silva, Rachael Silva (MRN [id number]) as of 08/01/2016 11:16  Ref. Range 08/05/2015 15:12 09/16/2015 12:46 03/30/2016 15:11 05/03/2016 08:42 07/26/2016 15:31  Eosinophil Latest Units: % 10 11 5  5.4 (H) 3   Assessment and Plan: 70 year old female with chronic  cough, recurrent URIs, being treated as ABPA/chronic  eosinophilic pneumonia, unable to wean her steroids, now with starting Xolair.  No problem-specific Assessment & Plan notes found for this encounter.   Updated Medication List Outpatient Encounter Prescriptions as of 09/06/2016  Medication Sig  . albuterol (ACCUNEB) 0.63 MG/3ML nebulizer solution Take 1 ampule by nebulization every 6 (six) hours as needed.    . B Complex-C-Folic Acid (STRESS B COMPLEX PO) Take 1 tablet by mouth daily.  . Calcium Carbonate-Vitamin D (CALTRATE 600+D PO) Take 1 tablet by mouth 2 (two) times daily.  Marland Kitchen. co-enzyme Q-10 30 MG capsule Take 200 mg by mouth daily.  Marland Kitchen. EPIPEN 2-PAK 0.3 MG/0.3ML SOAJ injection See admin instructions.  . fexofenadine (ALLEGRA ALLERGY) 60 MG tablet Take 60 mg by mouth daily.   . Fluticasone-Salmeterol (ADVAIR DISKUS) 250-50 MCG/DOSE AEPB Inhale 1 puff into the lungs 2 (two) times daily.   . furosemide (LASIX) 20 MG tablet TAKE 1 TABLET (20 MG TOTAL) BY MOUTH DAILY.  Marland Kitchen. GARLIC PO Take 1 capsule by mouth daily.  Marland Kitchen. glucose blood (ONE TOUCH ULTRA TEST) test strip USE TO TEST TWICE DAILY Dx E09.9  . levothyroxine (SYNTHROID, LEVOTHROID) 50 MCG tablet Take 1 tablet (50 mcg total) by mouth daily.  . montelukast (SINGULAIR) 10 MG tablet Take 1 tablet (10 mg total) by mouth daily.  . Multiple Vitamins-Minerals (CENTRUM ADULTS PO) Take 1 tablet by mouth daily.  Marland Kitchen. omeprazole (PRILOSEC) 40 MG capsule Take 1 capsule (40 mg total) by mouth daily. (Patient taking differently: Take 20 mg by mouth daily. )  . ONETOUCH DELICA LANCETS 33G MISC TEST SUGARS TWICE DAILY Dx E09.9  . predniSONE (DELTASONE) 5 MG tablet Take 2.5 tablets (12.5 mg total) by mouth daily with breakfast. Until follow up.  . traMADol (ULTRAM) 50 MG tablet Take 1 tablet (50 mg total) by mouth every 12 (twelve) hours as needed for severe pain (with caution of sedation).  . VENTOLIN HFA 108 (90 Base) MCG/ACT inhaler TAKE 1 TO 2 PUFFS EVERY 6 HOURS AS NEEDED   No facility-administered  encounter medications on file as of 09/06/2016.     Orders for this visit: No orders of the defined types were placed in this encounter.   Thank  you for the visitation and for allowing  [facility] to assist in the care of your patient. Our recommendations are noted above.  Please contact us if we can be of further service.  Stephanie AcreVishal Jonella Redditt, MD [facility] Office Number: [phone]

## 2016-09-06 NOTE — Assessment & Plan Note (Signed)
Cont with CPAP nightly CPAP 11cm H20

## 2016-09-06 NOTE — Assessment & Plan Note (Signed)
Recurrent exacerbation during the first half of 2015 with elevated serum eosinophils and abnormal CT  Aspergillus IgE panel neg w/ low positive titers for Asp Nidulans, Flavus and Terreus and amstel.  She has an elevated total IgE level, CT findings with mild bronchiectasis, groundglass opacities-given her history of asthma and his current lab and radiographic abnormalities there is a high suspicion for ABPA for which she has been on steroids for during the past 2 years, with inability to wean off.    Currently we are trying to taper her prednisone dose to the lowest possible effective dose that gives her the greatest clinical outcome. Patient got down to 7.5 mg every other day at one point, but is still having moderate amount of symptoms of cough, runny nose, sputum production, and shortness of breath. At today's visit her prednisone is 12.5 mg daily  Eosinophil % =3 IgE=1180 At her last visit we discussed Xolair as an addition to her current Asthma regimen, for which she is in agreement with, we are currently awaiting approval.    Plan - Continue Advair as directed - prednisone 12.5 mg daily until follow up visit -start Xolair.

## 2016-09-06 NOTE — Patient Instructions (Signed)
Follow up with Dr. Belia HemanKasa in 3 weeks - cont with Prednisone 12.5mg  daily - diet and exercise as tolerated - we will continue to work on [employer] paperwork

## 2016-09-06 NOTE — Progress Notes (Signed)
MRN# [id number] Rachael Silva Nov 30, 1945   CC: Chief Complaint  Patient presents with  . Follow-up    Prednisone 12.5mg  : prod cough w/yellow; SOB w/activity   synopsis: 70 year old female past medical history of asthma. Being followed by pulmonary for chronic eosinophilic pneumonia, status post bronchoscopy in December 2016 with negative cultures for AFB and fungus. Transbronchial biopsy-positive for eosinophilic infiltrate. Treated with steroids unable to wean since  October 2015.   Events since last clinic visit: Patient presents today for follow-up visit of her ABPA, asthma, chronic prednisone use secondary to lung disease. Patient states today that she is doing well, she is currently on prednisone 12.5 mg daily, and receiving allergy shots once weekly. Since her last visit she's not had any further exacerbations of eosinophilic pneumonia or ABPA. However, over the weekend she does have some mild increasing cough and yellowish sputum production which is slowly getting better. Also, at her last visit we discussed Xolair and we are currently awaiting approval.   Review of Systems: Gen:  Denies  fever, sweats, chills HEENT: Denies blurred vision, double vision, ear pain, eye pain, hearing loss, nose bleeds, sore throat Cvc:  No dizziness, chest pain or heaviness Resp:   Admits JX:BJYNWto:cough, DOE (stable), nasal congestion Gi: Denies swallowing difficulty, stomach pain, nausea or vomiting, diarrhea, constipation, bowel incontinence Gu:  Denies bladder incontinence, burning urine Ext:   No Joint pain, stiffness or swelling Skin: No skin rash, easy bruising or bleeding or hives Endoc:  No polyuria, polydipsia , polyphagia or weight change Other:  All other systems negative  Allergies:  Erythromycin  Physical Examination:  VS: BP 140/88 (BP Location: Left Arm, Cuff Size: Normal)   Pulse 97   Wt 280 lb (127 kg)   SpO2 100%   BMI 43.85 kg/m   General Appearance: No distress  HEENT:  PERRLA, no ptosis, no other lesions noticed. No bruits Pulmonary:normal breath sounds., diaphragmatic excursion normal.No wheezing, No rales   Cardiovascular:  Normal S1,S2.  No m/r/g.     Abdomen:Exam: Benign, Soft, non-tender, No masses  Skin:   warm, no rashes, no ecchymosis  Extremities: normal, no cyanosis, clubbing, warm with normal capillary refill.     (The following images and results were reviewed by Dr. Dema SeverinMungal on 09/06/2016). CT Chest 05/04/16 FINDINGS: Cardiovascular: Scattered aortic hand coronary artery calcifications. Stable calcification within the left ventricle. The Mediastinum/Nodes: Pretracheal lymph node has a short axis diameter of 9 mm compared with 11 mm previously. Other smaller scattered mediastinal lymph nodes. No axillary adenopathy or visible hilar adenopathy. Lungs/Pleura: Linear areas of scarring in the lingula and both lower lobes at the lung bases. Bochdalek's hernia noted on the right containing fat, stable. No confluent airspace opacities otherwise. No effusions. Upper Abdomen: Imaging into the upper abdomen shows no acute findings. Musculoskeletal: Chest wall soft tissues are unremarkable. No acute bony abnormality or focal bone lesion. IMPRESSION: Areas of linear scarring in the lung bases bilaterally. Right Bochdalek's type diaphoretic hernia containing fat, stable. Scattered coronary artery and aortic calcifications. No acute findings.  LABS Results for Rachael Silva, Rachael Silva (MRN [id number]) as of 08/01/2016 11:16  Ref. Range 07/02/2014 13:00 08/05/2015 15:12 03/30/2016 15:11  IgE (Immunoglobulin E), Serum Latest Ref Range: 0 - 100 IU/mL 2,401 (H) 1,253 (H) 1,180 (H)   Results for Rachael Silva, Rachael Silva (MRN [id number]) as of 08/01/2016 11:16  Ref. Range 08/05/2015 15:12 09/16/2015 12:46 03/30/2016 15:11 05/03/2016 08:42 07/26/2016 15:31  Eosinophil Latest Units: % 10 11 5  5.4 (H) 3   Assessment  and Plan: 70 year old female with chronic cough, recurrent URIs,  being treated as ABPA/chronic eosinophilic pneumonia, unable to wean her steroids, now with starting Xolair.  Asthma, chronic Recurrent exacerbation during the first half of 2015 with elevated serum eosinophils and abnormal CT  Aspergillus IgE panel neg w/ low positive titers for Asp Nidulans, Flavus and Terreus and amstel.  She has an elevated total IgE level, CT findings with mild bronchiectasis, groundglass opacities-given her history of asthma and his current lab and radiographic abnormalities there is a high suspicion for ABPA for which she has been on steroids for during the past 2 years, with inability to wean off.    Currently we are trying to taper her prednisone dose to the lowest possible effective dose that gives her the greatest clinical outcome. Patient got down to 7.5 mg every other day at one point, but is still having moderate amount of symptoms of cough, runny nose, sputum production, and shortness of breath. At today's visit her prednisone is 12.5 mg daily  Eosinophil % =3 IgE=1180 At her last visit we discussed Xolair as an addition to her current Asthma regimen, for which she is in agreement with, we are currently awaiting approval.    Plan - Continue Advair as directed - prednisone 12.5 mg daily until follow up visit -start Xolair.              ABPA (allergic bronchopulmonary aspergillosis) Again this is a working diagnosis, other differentials include hypersensitivity pneumonitis, eosinophilic pneumonias, NSIP, BOOP. All of these diagnoses are treated with high-dose steroids for a prolonged period of time. She does have significant clinical improvement once steroids were initiated, with rapid decline when steroids are terminated. Her further workup with Aspergillus skin testing, IgG levels, IgA levels were within normal limits.  The results of the skin testing and immunoglobulin testing were discussed in detail with the patient, they reviewed by me personally,  and the patient was given a copy of her results.  Patient educated on side effects of steroids (weight gain, wereretention, increased appetite, muscle weakness, bone degradation, dysfunctional calcium levels, elevated blood glucose levels, anxiety/agitation at night, decreased immune).  Will continue to taper steroid doses to the lowest possible dose that will provide symptomatic relief, the premise is to completely taper steroids off. Given her negative Aspergillus skin testing (this was done while patient on high-dose steroids, which could also give a false negative read), at some point in the future if steroids can be tapered off or patient has relapse of symptoms we will repeat skin testing.  Patient educated on the current diagnosis, she understands that this is not an active infection, it is most likely a hyperreactive reaction to antigen exposure. She does not need/require antifungal treatment at this time. This is her initial treatment course, if she has refractory or recurrent symptoms will consider adding antifungals at that time.  Bronchoscopy 09/2015: All cultures including fungal, bacterial, AFB a currently negative to date. Transbronchial biopsy showed eosinophilic infiltration consistent with eosinophilic pneumonia in 2016 Bronhoscopy with BAL 03/2016: negative cultures.   Plan:  - prednisone 12.5mg  daily until follow up visit.  Try to wean prednisone at next visit.  Continue allergy regiment Continue Advair Continue allergen avoidance  avoid sick contacts Xolair initiation    EP (eosinophilic pneumonia) (HCC)  Chronic eosinophilic pneumonia   -diagnosed by history, symptoms, transbronchial biopsy and bronchoscopy on 09/24/2015   At prior visit patient stated that she has a hx of nasal polyps, rhinosinusitis and has  been on BC powder for a number of years.   Discussed cessation of BC powder and all forms of aspirin with the patient, currently with mild improvement, but her  prednisone dose has also increased In the interm we will consider anti-IgE therapy (Xolair) or Anti-IL5 therapy (Nucala) workup, given her difficult to control asthma and need for chronic steroid.   Plan: -Prednisone 10 mg daily until follow up.  Avoid any allergens Continue Advair Continue allergy regiment and allergy shot Avoid sick contacts. Stopped BC powder and all forms of Aspirin Currently awaiting approval of Xolair  OSA on CPAP Cont with CPAP nightly CPAP 11cm H20   Updated Medication List Outpatient Encounter Prescriptions as of 09/06/2016  Medication Sig  . albuterol (ACCUNEB) 0.63 MG/3ML nebulizer solution Take 1 ampule by nebulization every 6 (six) hours as needed.    . B Complex-C-Folic Acid (STRESS B COMPLEX PO) Take 1 tablet by mouth daily.  . Calcium Carbonate-Vitamin D (CALTRATE 600+D PO) Take 1 tablet by mouth 2 (two) times daily.  Marland Kitchen. co-enzyme Q-10 30 MG capsule Take 200 mg by mouth daily.  Marland Kitchen. EPIPEN 2-PAK 0.3 MG/0.3ML SOAJ injection See admin instructions.  . fexofenadine (ALLEGRA ALLERGY) 60 MG tablet Take 60 mg by mouth daily.   . Fluticasone-Salmeterol (ADVAIR DISKUS) 250-50 MCG/DOSE AEPB Inhale 1 puff into the lungs 2 (two) times daily.   . furosemide (LASIX) 20 MG tablet TAKE 1 TABLET (20 MG TOTAL) BY MOUTH DAILY.  Marland Kitchen. GARLIC PO Take 1 capsule by mouth daily.  Marland Kitchen. glucose blood (ONE TOUCH ULTRA TEST) test strip USE TO TEST TWICE DAILY Dx E09.9  . levothyroxine (SYNTHROID, LEVOTHROID) 50 MCG tablet Take 1 tablet (50 mcg total) by mouth daily.  . montelukast (SINGULAIR) 10 MG tablet Take 1 tablet (10 mg total) by mouth daily.  . Multiple Vitamins-Minerals (CENTRUM ADULTS PO) Take 1 tablet by mouth daily.  Marland Kitchen. omeprazole (PRILOSEC) 40 MG capsule Take 1 capsule (40 mg total) by mouth daily. (Patient taking differently: Take 20 mg by mouth daily. )  . ONETOUCH DELICA LANCETS 33G MISC TEST SUGARS TWICE DAILY Dx E09.9  . predniSONE (DELTASONE) 5 MG tablet Take 2.5  tablets (12.5 mg total) by mouth daily with breakfast. Until follow up.  . traMADol (ULTRAM) 50 MG tablet Take 1 tablet (50 mg total) by mouth every 12 (twelve) hours as needed for severe pain (with caution of sedation).  . VENTOLIN HFA 108 (90 Base) MCG/ACT inhaler TAKE 1 TO 2 PUFFS EVERY 6 HOURS AS NEEDED   No facility-administered encounter medications on file as of 09/06/2016.     Orders for this visit: No orders of the defined types were placed in this encounter.   Thank  you for the visitation and for allowing  [facility] to assist in the care of your patient. Our recommendations are noted above.  Please contact us if we can be of further service.  Stephanie AcreVishal Azavier Creson, MD [facility] Office Number: [phone]

## 2016-09-07 DIAGNOSIS — J301 Allergic rhinitis due to pollen: Secondary | ICD-10-CM | POA: Diagnosis not present

## 2016-09-14 DIAGNOSIS — J301 Allergic rhinitis due to pollen: Secondary | ICD-10-CM | POA: Diagnosis not present

## 2016-09-21 ENCOUNTER — Ambulatory Visit: Payer: Medicare Other | Admitting: Internal Medicine

## 2016-09-21 DIAGNOSIS — J301 Allergic rhinitis due to pollen: Secondary | ICD-10-CM | POA: Diagnosis not present

## 2016-09-28 DIAGNOSIS — J301 Allergic rhinitis due to pollen: Secondary | ICD-10-CM | POA: Diagnosis not present

## 2016-10-11 DIAGNOSIS — J301 Allergic rhinitis due to pollen: Secondary | ICD-10-CM | POA: Diagnosis not present

## 2016-10-16 ENCOUNTER — Ambulatory Visit (INDEPENDENT_AMBULATORY_CARE_PROVIDER_SITE_OTHER): Payer: Medicare Other | Admitting: Internal Medicine

## 2016-10-16 ENCOUNTER — Encounter: Payer: Self-pay | Admitting: Internal Medicine

## 2016-10-16 VITALS — BP 138/80 | HR 94 | Wt 279.0 lb

## 2016-10-16 DIAGNOSIS — J45998 Other asthma: Secondary | ICD-10-CM | POA: Diagnosis not present

## 2016-10-16 DIAGNOSIS — J45909 Unspecified asthma, uncomplicated: Secondary | ICD-10-CM

## 2016-10-16 MED ORDER — AZITHROMYCIN 250 MG PO TABS: ORAL_TABLET | ORAL | 0 refills | Status: DC

## 2016-10-16 MED ORDER — PREDNISONE 5 MG PO TABS: 12.5000 mg | ORAL_TABLET | Freq: Every day | ORAL | 1 refills | Status: DC

## 2016-10-16 NOTE — Patient Instructions (Addendum)
waiting assessment and therapy with Xolair continue prednisone at 12.5 mg continue inhalers as prescribed Start Z pak

## 2016-10-16 NOTE — Progress Notes (Signed)
MRN# [id number] Rachael Silva 1945/11/23   CC: Chief Complaint  Patient presents with  . Follow-up    Xolair not started: Prod cough w. yellow mucus; no c/o SOB or chest tightness   synopsis: 71 year old female past medical history of asthma. Being followed by pulmonary for chronic eosinophilic pneumonia, status post bronchoscopy in December 2016 with negative cultures for AFB and fungus. Transbronchial biopsy-positive for eosinophilic infiltrate. Treated with steroids unable to wean since  October 2015.   Events since last clinic visit: Patient presents today for follow-up visit of her ABPA, asthma, chronic prednisone use secondary to lung disease. Patient states today that she is doing well, she is currently on prednisone 12.5 mg daily, and receiving allergy shots once weekly. Since her last visit she's not had any further exacerbations of eosinophilic pneumonia or ABPA. Also, at her last visit we discussed Xolair and we are currently awaiting approval.   Review of Systems: Gen:  Denies  fever, sweats, chills HEENT: Denies blurred vision, double vision, ear pain, eye pain, hearing loss, nose bleeds, sore throat Cvc:  No dizziness, chest pain or heaviness Resp:   Admits WU:[id number], DOE (stable), nasal congestion Gi: Denies swallowing difficulty, stomach pain, nausea or vomiting, diarrhea, constipation, bowel incontinence Gu:  Denies bladder incontinence, burning urine Ext:   No Joint pain, stiffness or swelling Skin: No skin rash, easy bruising or bleeding or hives Endoc:  No polyuria, polydipsia , polyphagia or weight change Other:  All other systems negative  Allergies:  Erythromycin  Physical Examination:  VS: BP 138/80 (BP Location: Left Arm, Cuff Size: Normal)   Pulse 94   Wt 279 lb (126.6 kg)   SpO2 99%   BMI 43.70 kg/m   General Appearance: No distress  HEENT: PERRLA, no ptosis, no other lesions noticed. No bruits Pulmonary:normal breath sounds., diaphragmatic excursion  normal.No wheezing, No rales   Cardiovascular:  Normal S1,S2.  No m/r/g.     Abdomen:Exam: Benign, Soft, non-tender, No masses  Skin:   warm, no rashes, no ecchymosis  Extremities: normal, no cyanosis, clubbing, warm with normal capillary refill.     (The following images and results were reviewed by Dr. Dema Severin on 10/16/2016). CT Chest 05/04/16 FINDINGS: Cardiovascular: Scattered aortic hand coronary artery calcifications. Stable calcification within the left ventricle. The Mediastinum/Nodes: Pretracheal lymph node has a short axis diameter of 9 mm compared with 11 mm previously. Other smaller scattered mediastinal lymph nodes. No axillary adenopathy or visible hilar adenopathy. Lungs/Pleura: Linear areas of scarring in the lingula and both lower lobes at the lung bases. Bochdalek's hernia noted on the right containing fat, stable. No confluent airspace opacities otherwise. No effusions. Upper Abdomen: Imaging into the upper abdomen shows no acute findings. Musculoskeletal: Chest wall soft tissues are unremarkable. No acute bony abnormality or focal bone lesion. IMPRESSION: Areas of linear scarring in the lung bases bilaterally. Right Bochdalek's type diaphoretic hernia containing fat, stable. Scattered coronary artery and aortic calcifications. No acute findings.  LABS Results for Rachael, Silva (MRN [id number]) as of 08/01/2016 11:16  Ref. Range 07/02/2014 13:00 08/05/2015 15:12 03/30/2016 15:11  IgE (Immunoglobulin E), Serum Latest Ref Range: 0 - 100 IU/mL 2,401 (H) 1,253 (H) 1,180 (H)   Results for Rachael, Silva (MRN [id number]) as of 08/01/2016 11:16  Ref. Range 08/05/2015 15:12 09/16/2015 12:46 03/30/2016 15:11 05/03/2016 08:42 07/26/2016 15:31  Eosinophil Latest Units: % 10 11 5  5.4 (H) 3   Assessment and Plan: 71 year old female with chronic cough, recurrent URIs, being treated as ABPA/chronic eosinophilic  pneumonia, unable to wean her steroids, now with starting Xolair.    Updated Medication List Outpatient Encounter Prescriptions as of 10/16/2016  Medication Sig  . albuterol (ACCUNEB) 0.63 MG/3ML nebulizer solution Take 1 ampule by nebulization every 6 (six) hours as needed.    . B Complex-C-Folic Acid (STRESS B COMPLEX PO) Take 1 tablet by mouth daily.  . Calcium Carbonate-Vitamin D (CALTRATE 600+D PO) Take 1 tablet by mouth 2 (two) times daily.  Marland Kitchen. co-enzyme Q-10 30 MG capsule Take 200 mg by mouth daily.  Marland Kitchen. EPIPEN 2-PAK 0.3 MG/0.3ML SOAJ injection See admin instructions.  . fexofenadine (ALLEGRA ALLERGY) 60 MG tablet Take 60 mg by mouth daily.   . Fluticasone-Salmeterol (ADVAIR DISKUS) 250-50 MCG/DOSE AEPB Inhale 1 puff into the lungs 2 (two) times daily.   . furosemide (LASIX) 20 MG tablet TAKE 1 TABLET (20 MG TOTAL) BY MOUTH DAILY.  Marland Kitchen. GARLIC PO Take 1 capsule by mouth daily.  Marland Kitchen. glucose blood (ONE TOUCH ULTRA TEST) test strip USE TO TEST TWICE DAILY Dx E09.9  . levothyroxine (SYNTHROID, LEVOTHROID) 50 MCG tablet Take 1 tablet (50 mcg total) by mouth daily.  . montelukast (SINGULAIR) 10 MG tablet Take 1 tablet (10 mg total) by mouth daily.  . Multiple Vitamins-Minerals (CENTRUM ADULTS PO) Take 1 tablet by mouth daily.  Marland Kitchen. omeprazole (PRILOSEC) 40 MG capsule Take 1 capsule (40 mg total) by mouth daily. (Patient taking differently: Take 20 mg by mouth daily. )  . ONETOUCH DELICA LANCETS 33G MISC TEST SUGARS TWICE DAILY Dx E09.9  . predniSONE (DELTASONE) 5 MG tablet Take 2.5 tablets (12.5 mg total) by mouth daily with breakfast. Until follow up.  . traMADol (ULTRAM) 50 MG tablet Take 1 tablet (50 mg total) by mouth every 12 (twelve) hours as needed for severe pain (with caution of sedation).  . VENTOLIN HFA 108 (90 Base) MCG/ACT inhaler TAKE 1 TO 2 PUFFS EVERY 6 HOURS AS NEEDED  . [DISCONTINUED] predniSONE (DELTASONE) 5 MG tablet Take 2.5 tablets (12.5 mg total) by mouth daily with breakfast. Until follow up.   No facility-administered encounter medications on  file as of 10/16/2016.    Assessment and Plan: 71 year old female with chronic cough, recurrent URIs, being treated as ABPA/chronic eosinophilic pneumonia, unable to wean her steroids, now with starting Xolair.  Asthma, chronic Recurrent exacerbation during the first half of 2015 with elevated serum eosinophils and abnormal CT  Aspergillus IgE panel neg w/ low positive titers for Asp Nidulans, Flavus and Terreus and amstel.  She has an elevated total IgE level, CT findings with mild bronchiectasis, groundglass opacities-given her history of asthma and his current lab and radiographic abnormalities there is a high suspicion for ABPA for which she has been on steroids for during the past 2 years, with inability to wean off. Current dose is at 12.5 mg of prednisone-this seems to be helping her asthma at this time   Currently we are trying to taper her prednisone dose to the lowest possible effective dose that gives her the greatest clinical outcome. Patient got down to 7.5 mg every other day at one point, but is still having moderate amount of symptoms of cough, runny nose, sputum production, and shortness of breath. At today's visit her prednisone is 12.5 mg daily  Eosinophil % =3 IgE=1180 At her last visit Dr. Dema SeverinMungal discussed Xolair as an addition to her current Asthma regimen, for which she is in agreement with, we are currently awaiting approval.    Plan - Continue Advair  as directed - prednisone 12.5 mg daily until follow up visit -plan is to start  Xolair.      ABPA (allergic bronchopulmonary aspergillosis) Again this is a working diagnosis, other differentials include hypersensitivity pneumonitis, eosinophilic pneumonias, NSIP, BOOP. All of these diagnoses are treated with high-dose steroids for a prolonged period of time. She does have significant clinical improvement once steroids were initiated, with rapid decline when steroids are terminated. Her further workup with Aspergillus  skin testing, IgG levels, IgA levels were within normal limits.  The results of the skin testing and immunoglobulin testing were discussed in detail with the patient, they reviewed by me personally, and the patient was given a copy of her results.  Patient educated on side effects of steroids (weight gain, wereretention, increased appetite, muscle weakness, bone degradation, dysfunctional calcium levels, elevated blood glucose levels, anxiety/agitation at night, decreased immune).  Will continue to taper steroid doses to the lowest possible dose that will provide symptomatic relief, the premise is to completely taper steroids off. Given her negative Aspergillus skin testing (this was done while patient on high-dose steroids, which could also give a false negative read), at some point in the future if steroids can be tapered off or patient has relapse of symptoms we will repeat skin testing.  Patient educated on the current diagnosis, she understands that this is not an active infection, it is most likely a hyperreactive reaction to antigen exposure. She does not need/require antifungal treatment at this time. This is her initial treatment course, if she has refractory or recurrent symptoms will consider adding antifungals at that time.  Bronchoscopy 09/2015: All cultures including fungal, bacterial, AFB a currently negative to date. Transbronchial biopsy showed eosinophilic infiltration consistent with eosinophilic pneumonia in 2016 Bronhoscopy with BAL 03/2016: negative cultures.   Plan:  - prednisone 12.5mg  daily until follow up visit.  Continue allergy regiment Continue Advair Continue allergen avoidance  avoid sick contacts Xolair initiation    EP (eosinophilic pneumonia) (HCC)  Chronic eosinophilic pneumonia   -diagnosed by history, symptoms, transbronchial biopsy and bronchoscopy on 09/24/2015   At prior visit patient stated that she has a hx of nasal polyps, rhinosinusitis and has  been on [facility] powder for a number of years.   Discussed cessation of BC powder and all forms of aspirin with the patient, currently with mild improvement, but her prednisone dose has also increased In the interm we will consider anti-IgE therapy (Xolair) or Anti-IL5 therapy (Nucala) workup, given her difficult to control asthma and need for chronic steroid.   Plan: -Prednisone 12.5  mg daily until follow up.  Avoid any allergens Continue Advair Continue allergy regiment and allergy shot Avoid sick contacts. Stopped BC powder and all forms of Aspirin Currently awaiting approval of Xolair  OSA on CPAP Cont with CPAP nightly CPAP 11cm H20  I have personally obtained a history, examined the patient, evaluated Pertinent laboratory and RadioGraphic/imaging results, and  formulated the assessment and plan   The Patient requires high complexity decision making for assessment and support, frequent evaluation and titration of therapies.   Patient satisfied with Plan of action and management. All questions answered  Lucie Leather, M.D.  [facility]  Medical Director [facility] Medical Director [facility] Cardio-Pulmonary Department

## 2016-10-19 DIAGNOSIS — J301 Allergic rhinitis due to pollen: Secondary | ICD-10-CM | POA: Diagnosis not present

## 2016-10-30 DIAGNOSIS — J301 Allergic rhinitis due to pollen: Secondary | ICD-10-CM | POA: Diagnosis not present

## 2016-11-05 ENCOUNTER — Telehealth: Payer: Self-pay | Admitting: Family Medicine

## 2016-11-05 DIAGNOSIS — E7800 Pure hypercholesterolemia, unspecified: Secondary | ICD-10-CM

## 2016-11-05 DIAGNOSIS — E099 Drug or chemical induced diabetes mellitus without complications: Secondary | ICD-10-CM

## 2016-11-05 DIAGNOSIS — T380X5A Adverse effect of glucocorticoids and synthetic analogues, initial encounter: Principal | ICD-10-CM

## 2016-11-05 NOTE — Telephone Encounter (Signed)
-----   Message from Alvina Chouerri J Walsh sent at 11/03/2016  3:11 PM EST ----- Regarding: Lab orders for Monday 2.5.18 Lab orders for a 6 month follow up appt

## 2016-11-09 DIAGNOSIS — J301 Allergic rhinitis due to pollen: Secondary | ICD-10-CM | POA: Diagnosis not present

## 2016-11-10 DIAGNOSIS — J301 Allergic rhinitis due to pollen: Secondary | ICD-10-CM | POA: Diagnosis not present

## 2016-11-13 ENCOUNTER — Other Ambulatory Visit: Payer: Medicare Other

## 2016-11-16 DIAGNOSIS — J301 Allergic rhinitis due to pollen: Secondary | ICD-10-CM | POA: Diagnosis not present

## 2016-11-17 ENCOUNTER — Ambulatory Visit: Payer: Medicare Other | Admitting: Family Medicine

## 2016-11-22 ENCOUNTER — Other Ambulatory Visit (INDEPENDENT_AMBULATORY_CARE_PROVIDER_SITE_OTHER): Payer: Medicare Other

## 2016-11-22 DIAGNOSIS — E099 Drug or chemical induced diabetes mellitus without complications: Secondary | ICD-10-CM | POA: Diagnosis not present

## 2016-11-22 DIAGNOSIS — T380X5A Adverse effect of glucocorticoids and synthetic analogues, initial encounter: Secondary | ICD-10-CM | POA: Diagnosis not present

## 2016-11-22 DIAGNOSIS — J301 Allergic rhinitis due to pollen: Secondary | ICD-10-CM | POA: Diagnosis not present

## 2016-11-22 DIAGNOSIS — E7800 Pure hypercholesterolemia, unspecified: Secondary | ICD-10-CM | POA: Diagnosis not present

## 2016-11-22 LAB — COMPREHENSIVE METABOLIC PANEL
ALBUMIN: 4 g/dL (ref 3.5–5.2)
ALT: 15 U/L (ref 0–35)
AST: 16 U/L (ref 0–37)
Alkaline Phosphatase: 70 U/L (ref 39–117)
BILIRUBIN TOTAL: 0.4 mg/dL (ref 0.2–1.2)
BUN: 13 mg/dL (ref 6–23)
CALCIUM: 9.7 mg/dL (ref 8.4–10.5)
CHLORIDE: 103 meq/L (ref 96–112)
CO2: 32 mEq/L (ref 19–32)
CREATININE: 1 mg/dL (ref 0.40–1.20)
GFR: 70.74 mL/min (ref >60.00)
Glucose, Bld: 77 mg/dL (ref 70–99)
Potassium: 3.7 mEq/L (ref 3.5–5.1)
Sodium: 140 mEq/L (ref 135–145)
Total Protein: 7.5 g/dL (ref 6.0–8.3)

## 2016-11-22 LAB — MICROALBUMIN / CREATININE URINE RATIO
CREATININE, U: 98.3 mg/dL
Microalb Creat Ratio: 0.7 mg/g (ref 0.0–30.0)
Microalb, Ur: <0.7 mg/dL (ref 0.0–1.9)

## 2016-11-22 LAB — HEMOGLOBIN A1C: HEMOGLOBIN A1C: 6.5 % (ref 4.6–6.5)

## 2016-11-22 LAB — LIPID PANEL
CHOLESTEROL: 207 mg/dL — AB (ref 0–200)
HDL: 87.9 mg/dL (ref >39.00)
LDL Cholesterol: 107 mg/dL — ABNORMAL HIGH (ref 0–99)
NonHDL: 119.56
TRIGLYCERIDES: 63 mg/dL (ref 0.0–149.0)
Total CHOL/HDL Ratio: 2
VLDL: 12.6 mg/dL (ref 0.0–40.0)

## 2016-11-27 ENCOUNTER — Other Ambulatory Visit: Payer: Medicare Other

## 2016-11-30 DIAGNOSIS — J301 Allergic rhinitis due to pollen: Secondary | ICD-10-CM | POA: Diagnosis not present

## 2016-12-01 ENCOUNTER — Encounter: Payer: Self-pay | Admitting: Family Medicine

## 2016-12-01 ENCOUNTER — Ambulatory Visit (INDEPENDENT_AMBULATORY_CARE_PROVIDER_SITE_OTHER): Payer: Medicare Other | Admitting: Family Medicine

## 2016-12-01 VITALS — BP 132/78 | HR 96 | Temp 98.2°F | Ht 67.0 in | Wt 278.0 lb

## 2016-12-01 DIAGNOSIS — B4481 Allergic bronchopulmonary aspergillosis: Secondary | ICD-10-CM | POA: Diagnosis not present

## 2016-12-01 DIAGNOSIS — E785 Hyperlipidemia, unspecified: Secondary | ICD-10-CM

## 2016-12-01 DIAGNOSIS — T380X5A Adverse effect of glucocorticoids and synthetic analogues, initial encounter: Secondary | ICD-10-CM

## 2016-12-01 DIAGNOSIS — E099 Drug or chemical induced diabetes mellitus without complications: Secondary | ICD-10-CM | POA: Diagnosis not present

## 2016-12-01 DIAGNOSIS — R61 Generalized hyperhidrosis: Secondary | ICD-10-CM

## 2016-12-01 DIAGNOSIS — N951 Menopausal and female climacteric states: Secondary | ICD-10-CM

## 2016-12-01 HISTORY — DX: Generalized hyperhidrosis: R61

## 2016-12-01 MED ORDER — GABAPENTIN 100 MG PO CAPS: 100.0000 mg | ORAL_CAPSULE | Freq: Three times a day (TID) | ORAL | 11 refills | Status: DC

## 2016-12-01 MED ORDER — METFORMIN HCL 500 MG PO TABS: 250.0000 mg | ORAL_TABLET | Freq: Two times a day (BID) | ORAL | 11 refills | Status: DC

## 2016-12-01 NOTE — Patient Instructions (Addendum)
Don't forget to schedule your eye exam  Also your mammogram   For cholesterol   Avoid red meat/ fried foods/ egg yolks/ fatty breakfast meats/ butter, cheese and high fat dairy/ and shellfish   Your "good cholesterol" is very high-this is protective   If you can breathe well enough to get back to the gym - please   Try low dose metformin 250 mg twice daily   For sweats - we can try gabapentin 100 mg three times daily   For week one take gabapentin 100 mg at bedtime Week two -take am and pm  Week three am / mid day and pm  If any intolerable side effects stop it and let us know   Call pulmonary to see what the status of the new medicine is   Take care of yourself   Follow up with me in 3 months   Fat and Cholesterol Restricted Diet High levels of fat and cholesterol in your blood may lead to various health problems, such as diseases of the heart, blood vessels, gallbladder, liver, and pancreas. Fats are concentrated sources of energy that come in various forms. Certain types of fat, including saturated fat, may be harmful in excess. Cholesterol is a substance needed by your body in small amounts. Your body makes all the cholesterol it needs. Excess cholesterol comes from the food you eat. When you have high levels of cholesterol and saturated fat in your blood, health problems can develop because the excess fat and cholesterol will gather along the walls of your blood vessels, causing them to narrow. Choosing the right foods will help you control your intake of fat and cholesterol. This will help keep the levels of these substances in your blood within normal limits and reduce your risk of disease. What is my plan? Your health care provider recommends that you:  Limit your fat intake to ______% or less of your total calories per day.  Limit the amount of cholesterol in your diet to less than _________mg per day.  Eat 20-30 grams of fiber each day. What types of fat should I  choose?  Choose healthy fats more often. Choose monounsaturated and polyunsaturated fats, such as olive and canola oil, flaxseeds, walnuts, almonds, and seeds.  Eat more omega-3 fats. Good choices include salmon, mackerel, sardines, tuna, flaxseed oil, and ground flaxseeds. Aim to eat fish at least two times a week.  Limit saturated fats. Saturated fats are primarily found in animal products, such as meats, butter, and cream. Plant sources of saturated fats include palm oil, palm kernel oil, and coconut oil.  Avoid foods with partially hydrogenated oils in them. These contain trans fats. Examples of foods that contain trans fats are stick margarine, some tub margarines, cookies, crackers, and other baked goods. What general guidelines do I need to follow? These guidelines for healthy eating will help you control your intake of fat and cholesterol:  Check food labels carefully to identify foods with trans fats or high amounts of saturated fat.  Fill one half of your plate with vegetables and green salads.  Fill one fourth of your plate with whole grains. Look for the word "whole" as the first word in the ingredient list.  Fill one fourth of your plate with lean protein foods.  Limit fruit to two servings a day. Choose fruit instead of juice.  Eat more foods that contain fiber, such as apples, broccoli, carrots, beans, peas, and barley.  Eat more home-cooked food and less restaurant,  buffet, and fast food.  Limit or avoid alcohol.  Limit foods high in starch and sugar.  Limit fried foods.  Cook foods using methods other than frying. Baking, boiling, grilling, and broiling are all great options.  Lose weight if you are overweight. Losing just 5-10% of your initial body weight can help your overall health and prevent diseases such as diabetes and heart disease. What foods can I eat? Grains  Whole grains, such as whole wheat or whole grain breads, crackers, cereals, and pasta.  Unsweetened oatmeal, bulgur, barley, quinoa, or brown rice. Corn or whole wheat flour tortillas. Vegetables  Fresh or frozen vegetables (raw, steamed, roasted, or grilled). Green salads. Fruits  All fresh, canned (in natural juice), or frozen fruits. Meats and other protein foods  Ground beef (85% or leaner), grass-fed beef, or beef trimmed of fat. Skinless chicken or [place]. Ground chicken or [place]. Pork trimmed of fat. All fish and seafood. Eggs. Dried beans, peas, or lentils. Unsalted nuts or seeds. Unsalted canned or dry beans. Dairy  Low-fat dairy products, such as skim or 1% milk, 2% or reduced-fat cheeses, low-fat ricotta or cottage cheese, or plain low-fat yo Fats and oils  Tub margarines without trans fats. Light or reduced-fat mayonnaise and salad dressings. Avocado. Olive, canola, sesame, or safflower oils. Natural peanut or almond butter (choose ones without added sugar and oil). The items listed above may not be a complete list of recommended foods or beverages. Contact your dietitian for more options.  Foods to avoid Grains  White bread. White pasta. White rice. Cornbread. Bagels, pastries, and croissants. Crackers that contain trans fat. Vegetables  White potatoes. Corn. Creamed or fried vegetables. Vegetables in a cheese sauce. Fruits  Dried fruits. Canned fruit in light or heavy syrup. Fruit juice. Meats and other protein foods  Fatty cuts of meat. Ribs, chicken wings, bacon, sausage, bologna, salami, chitterlings, fatback, hot dogs, bratwurst, and packaged luncheon meats. Liver and organ meats. Dairy  Whole or 2% milk, cream, half-and-half, and cream cheese. Whole milk cheeses. Whole-fat or sweetened yogurt. Full-fat cheeses. Nondairy creamers and whipped toppings. Processed cheese, cheese spreads, or cheese curds. Beverages  Alcohol. Sweetened drinks (such as sodas, lemonade, and fruit drinks or punches). Fats and oils  Butter, stick margarine, lard,  shortening, ghee, or bacon fat. Coconut, palm kernel, or palm oils. Sweets and desserts  Corn syrup, sugars, honey, and molasses. Candy. Jam and jelly. Syrup. Sweetened cereals. Cookies, pies, cakes, donuts, muffins, and ice cream. The items listed above may not be a complete list of foods and beverages to avoid. Contact your dietitian for more information.  This information is not intended to replace advice given to you by your health care provider. Make sure you discuss any questions you have with your health care provider. Document Released: 09/25/2005 Document Revised: 10/16/2014 Document Reviewed: 12/24/2013 Elsevier Interactive Patient Education  2017 [employer].

## 2016-12-01 NOTE — Progress Notes (Signed)
Subjective:    Patient ID: Rachael Silva, female    DOB: 1946/01/24, 71 y.o.   MRN: [id number]  HPI Here for f/u of chronic health problems   Wt Readings from Last 3 Encounters:  12/01/16 278 lb (126.1 kg)  10/16/16 279 lb (126.6 kg)  09/06/16 280 lb (127 kg)  down 2 lb since nov  bmi 43.5  She takes prednisone for chronic pulmonary problems (asthma and ABPA) Had not been able to wean from steroids Currently on 12.5 mg daily until follow up  Also allergy immunotherapy  Was to be started on Xolair on 1/8 (this is anti-IgE therapy)- if approved    BP Readings from Last 3 Encounters:  12/01/16 132/78  10/16/16 138/80  09/06/16 140/88     Chemistry      Component Value Date/Time   NA 140 11/22/2016 0941   K 3.7 11/22/2016 0941   CL 103 11/22/2016 0941   CO2 32 11/22/2016 0941   BUN 13 11/22/2016 0941   CREATININE 1.00 11/22/2016 0941      Component Value Date/Time   CALCIUM 9.7 11/22/2016 0941   ALKPHOS 70 11/22/2016 0941   AST 16 11/22/2016 0941   ALT 15 11/22/2016 0941   BILITOT 0.4 11/22/2016 0941       Diabetes Home sugar results  DM diet -doing ok [place] - worse after the holidays/ now getting back to the dm diet  Exercise -has not been/planning to get back to the gym (thinks breathing is ok to exercise)-has been too busy to exercise  Symptoms A1C last  Lab Results  Component Value Date   HGBA1C 6.5 11/22/2016  this is up from 6.3 Pt states in the past - metformin made her faint feeling and low glucose - is open to trying 1/2 pill  No problems with medications  microalbumin is normal Last eye exam  --last year - due for that   A lot of sweats  Intolerable  Worse with the steroids   Cholesterol Lab Results  Component Value Date   CHOL 207 (H) 11/22/2016   CHOL 208 (H) 05/31/2016   CHOL 191 05/03/2016   Lab Results  Component Value Date   HDL 87.90 11/22/2016   HDL 82.60 05/31/2016   HDL 71.10 05/03/2016   Lab Results  Component Value  Date   LDLCALC 107 (H) 11/22/2016   LDLCALC 115 (H) 05/31/2016   LDLCALC 109 (H) 05/03/2016   Lab Results  Component Value Date   TRIG 63.0 11/22/2016   TRIG 52.0 05/31/2016   TRIG 54.0 05/03/2016   Lab Results  Component Value Date   CHOLHDL 2 11/22/2016   CHOLHDL 3 05/31/2016   CHOLHDL 3 05/03/2016   Lab Results  Component Value Date   LDLDIRECT 138.9 04/07/2011   LDLDIRECT 123.4 09/26/2010   LDLDIRECT 139.1 04/05/2010    Care giving - mother and daughters quite a lot  Very busy- not a lot of time for self care   Overdue for mammogram  She had bx in 2013 (b9)  She was avoiding doing it again  Plans to schedule her screening mammogram   Patient Active Problem List   Diagnosis Date Noted  . Night sweats 12/01/2016  . Encounter for screening mammogram for breast cancer 05/17/2016  . Exposure to communicable disease 05/17/2016  . OSA on CPAP 05/11/2016  . Syncopal episodes 05/11/2016  . Chronic cough   . EP (eosinophilic pneumonia) (HCC)   . Memory impairment 05/11/2015  . Excessive daytime  sleepiness 05/11/2015  . Snoring 05/05/2015  . Morbid obesity (HCC) 05/05/2015  . Steroid-induced diabetes mellitus (HCC) 11/11/2014  . Glucocorticoids and synthetic analogues causing adverse effect in therapeutic use 08/27/2014  . Long term current use of systemic steroids 08/27/2014  . Estrogen deficiency 08/27/2014  . ABPA (allergic bronchopulmonary aspergillosis) (HCC) 07/08/2014  . Cough 06/18/2014  . Asthma, chronic 06/18/2014  . Productive cough 05/27/2014  . Bursitis, shoulder 12/09/2013  . Acute bronchitis with bronchospasm 05/23/2013  . Abnormal mammogram with microcalcification 06/06/2011  . Other screening mammogram 04/17/2011  . Routine general medical examination at a health care facility 04/17/2011  . Hypothyroidism 06/01/2009  . EDEMA 03/03/2009  . SINUSITIS, CHRONIC 08/18/2008  . ARTHRITIS, RIGHT KNEE 04/15/2008  . Weight gain due to medication 02/18/2008   . HOT FLASHES 02/18/2008  . GERD (gastroesophageal reflux disease) 08/01/2007  . HYPERCHOLESTEROLEMIA 05/15/2007  . ALLERGIC RHINITIS 05/15/2007  . ASTHMA 05/15/2007  . PERIODONTAL DISEASE 05/15/2007  . MURMUR 05/15/2007  . URINARY INCONTINENCE 05/15/2007   Past Medical History:  Diagnosis Date  . Allergy    allergic rhinitis  . Angioedema 02/2006  . Arthritis    OA of knees  . Asthma   . Complication of anesthesia    apnea- sleep  . Frequent headaches   . Hiatal hernia   . Hypertension   . Hypothyroid   . Labile blood pressure   . Lower extremity edema   . Lung disease   . Pneumonia   . Pneumonia, eosinophilic (HCC)   . Shortness of breath dyspnea   . Sleep apnea   . Urinary incontinence   . Wheezing    Past Surgical History:  Procedure Laterality Date  . BREAST SURGERY  1966   breast biopsy  . CESAREAN SECTION     x2  . FLEXIBLE BRONCHOSCOPY N/A 09/16/2015   Procedure: FLEXIBLE BRONCHOSCOPY WITH FLURO ;  Surgeon: Stephanie AcreVishal Mungal, MD;  Location: [facility] ORS;  Service: Pulmonary;  Laterality: N/A;   Social History  Substance Use Topics  . Smoking status: Never Smoker  . Smokeless tobacco: Never Used  . Alcohol use No   Family History  Problem Relation Age of Onset  . Diabetes Mother     pre-diabetic  . Heart disease Mother     ? heart disease  . Emphysema Mother   . Thyroid disease Mother   . Diabetes Maternal Uncle    Allergies  Allergen Reactions  . Erythromycin     REACTION: feels sick   Current Outpatient Prescriptions on File Prior to Visit  Medication Sig Dispense Refill  . albuterol (ACCUNEB) 0.63 MG/3ML nebulizer solution Take 1 ampule by nebulization every 6 (six) hours as needed.      Marland Kitchen. azithromycin (ZITHROMAX Z-PAK) 250 MG tablet Use as directed 6 each 0  . B Complex-C-Folic Acid (STRESS B COMPLEX PO) Take 1 tablet by mouth daily.    . Calcium Carbonate-Vitamin D (CALTRATE 600+D PO) Take 1 tablet by mouth 2 (two) times daily.    Marland Kitchen. co-enzyme  Q-10 30 MG capsule Take 200 mg by mouth daily.    Marland Kitchen. EPIPEN 2-PAK 0.3 MG/0.3ML SOAJ injection See admin instructions.  99  . fexofenadine (ALLEGRA ALLERGY) 60 MG tablet Take 60 mg by mouth daily.     . Fluticasone-Salmeterol (ADVAIR DISKUS) 250-50 MCG/DOSE AEPB Inhale 1 puff into the lungs 2 (two) times daily.     . furosemide (LASIX) 20 MG tablet TAKE 1 TABLET (20 MG TOTAL) BY MOUTH DAILY. 30  tablet 11  . GARLIC PO Take 1 capsule by mouth daily.    Marland Kitchen glucose blood (ONE TOUCH ULTRA TEST) test strip USE TO TEST TWICE DAILY Dx E09.9 50 each 11  . levothyroxine (SYNTHROID, LEVOTHROID) 50 MCG tablet Take 1 tablet (50 mcg total) by mouth daily. 90 tablet 2  . montelukast (SINGULAIR) 10 MG tablet Take 1 tablet (10 mg total) by mouth daily. 30 tablet 11  . Multiple Vitamins-Minerals (CENTRUM ADULTS PO) Take 1 tablet by mouth daily.    Marland Kitchen omeprazole (PRILOSEC) 40 MG capsule Take 1 capsule (40 mg total) by mouth daily. (Patient taking differently: Take 20 mg by mouth daily. ) 30 capsule 5  . ONETOUCH DELICA LANCETS 33G MISC TEST SUGARS TWICE DAILY Dx E09.9 100 each 3  . predniSONE (DELTASONE) 5 MG tablet Take 2.5 tablets (12.5 mg total) by mouth daily with breakfast. Until follow up. 90 tablet 1  . traMADol (ULTRAM) 50 MG tablet Take 1 tablet (50 mg total) by mouth every 12 (twelve) hours as needed for severe pain (with caution of sedation). 60 tablet 0  . VENTOLIN HFA 108 (90 Base) MCG/ACT inhaler TAKE 1 TO 2 PUFFS EVERY 6 HOURS AS NEEDED 18 g 11   No current facility-administered medications on file prior to visit.       Review of Systems Review of Systems  Constitutional: Negative for fever, appetite change, fatigue and unexpected weight change.  Eyes: Negative for pain and visual disturbance.  Respiratory: Negative for cough and shortness of breath.  pos for occ wheeze/improved  Cardiovascular: Negative for cp or palpitations    Gastrointestinal: Negative for nausea, diarrhea and constipation.    Genitourinary: Negative for urgency and frequency.  Skin: Negative for pallor or rash   Neurological: Negative for weakness, light-headedness, numbness and headaches.  Hematological: Negative for adenopathy. Does not bruise/bleed easily.  Psychiatric/Behavioral: Negative for dysphoric mood. The patient is not nervous/anxious.         Objective:   Physical Exam  Constitutional: She appears well-developed and well-nourished. No distress.  obese and well appearing   HENT:  Head: Normocephalic and atraumatic.  Mouth/Throat: Oropharynx is clear and moist.  Eyes: Conjunctivae and EOM are normal. Pupils are equal, round, and reactive to light.  Neck: Normal range of motion. Neck supple. No JVD present. Carotid bruit is not present. No thyromegaly present.  Cardiovascular: Normal rate, regular rhythm, normal heart sounds and intact distal pulses.  Exam reveals no gallop.   Pulmonary/Chest: Effort normal and breath sounds normal. No respiratory distress. She has no wheezes. She has no rales.  No crackles  Harsh bs  No wheeze Good air exch today  Abdominal: Soft. Bowel sounds are normal. She exhibits no distension, no abdominal bruit and no mass. There is no tenderness.  Musculoskeletal: She exhibits no edema.  Lymphadenopathy:    She has no cervical adenopathy.  Neurological: She is alert. She has normal reflexes.  Skin: Skin is warm and dry. No rash noted. No pallor.  Psychiatric: She has a normal mood and affect.          Assessment & Plan:   Problem List Items Addressed This Visit      Respiratory   ABPA (allergic bronchopulmonary aspergillosis) (HCC)    Continues prednisone  Pulmonary is looking to get coverage for another medication for Xolair         Endocrine   Steroid-induced diabetes mellitus (HCC) - Primary    Still on prednisone at this time  for eosinophilic respiratory process  Lab Results  Component Value Date   HGBA1C 6.5 11/22/2016   This is up slt  Has  done DM teaching- working on healthy eating and exercise as tolerated  She is open to trying low dose metformin again -may help wt loss  250 bid If this causes low glucose- she will hold it and update [place]  She will make her own pthy appt as well  Disc diet change for cholesterol control as well  F/u 3 mo       Relevant Medications   metFORMIN (GLUCOPHAGE) 500 MG tablet     Other   HOT FLASHES    With night sweats  Menopausal and worse with prednisone Trial of gabapentin      Hyperlipidemia, mild    LDL is not at goal but HDL is very high Disc goals for lipids and reasons to control them Rev labs with pt Rev low sat fat diet in detail Given info on diet       Morbid obesity (HCC)    Discussed how this problem influences overall health and the risks it imposes  Reviewed plan for weight loss with lower calorie diet (via better food choices and also portion control or program like weight watchers) and exercise building up to or more than 30 minutes 5 days per week including some aerobic activity   This is more difficult in the face of prednisone Will keep working on it       Relevant Medications   metFORMIN (GLUCOPHAGE) 500 MG tablet   Night sweats    Suspect hormonal/menopausal  Worsened by ongoing prednisone  Want to avoid HRT if possible Trial ot gabapentin - titrate up to tid as tol and then we can inc if needed

## 2016-12-01 NOTE — Progress Notes (Signed)
Pre visit review using our clinic review tool, if applicable. No additional management support is needed unless otherwise documented below in the visit note. 

## 2016-12-03 DIAGNOSIS — E785 Hyperlipidemia, unspecified: Secondary | ICD-10-CM | POA: Insufficient documentation

## 2016-12-03 DIAGNOSIS — E1169 Type 2 diabetes mellitus with other specified complication: Secondary | ICD-10-CM | POA: Insufficient documentation

## 2016-12-03 NOTE — Assessment & Plan Note (Signed)
With night sweats  Menopausal and worse with prednisone Trial of gabapentin

## 2016-12-03 NOTE — Assessment & Plan Note (Signed)
Suspect hormonal/menopausal  Worsened by ongoing prednisone  Want to avoid HRT if possible Trial ot gabapentin - titrate up to tid as tol and then we can inc if needed

## 2016-12-03 NOTE — Assessment & Plan Note (Signed)
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

## 2016-12-03 NOTE — Assessment & Plan Note (Signed)
Continues prednisone  Pulmonary is looking to get coverage for another medication for Xolair

## 2016-12-03 NOTE — Assessment & Plan Note (Signed)
LDL is not at goal but HDL is very high Disc goals for lipids and reasons to control them Rev labs with pt Rev low sat fat diet in detail Given info on diet

## 2016-12-03 NOTE — Assessment & Plan Note (Signed)
Discussed how this problem influences overall health and the risks it imposes  Reviewed plan for weight loss with lower calorie diet (via better food choices and also portion control or program like weight watchers) and exercise building up to or more than 30 minutes 5 days per week including some aerobic activity   This is more difficult in the face of prednisone Will keep working on it

## 2016-12-06 DIAGNOSIS — J301 Allergic rhinitis due to pollen: Secondary | ICD-10-CM | POA: Diagnosis not present

## 2016-12-11 ENCOUNTER — Telehealth: Payer: Self-pay | Admitting: Internal Medicine

## 2016-12-11 NOTE — Telephone Encounter (Signed)
Tried to call pt but no answer and no VM available. Will call back at a later time.

## 2016-12-11 NOTE — Telephone Encounter (Signed)
Pt states she has not heard from anyone regarding "my shot". Please call and advise status.

## 2016-12-12 ENCOUNTER — Other Ambulatory Visit: Payer: Self-pay | Admitting: *Deleted

## 2016-12-12 MED ORDER — PREDNISONE 5 MG PO TABS: 12.5000 mg | ORAL_TABLET | Freq: Every day | ORAL | 1 refills | Status: DC

## 2016-12-12 NOTE — Telephone Encounter (Signed)
LMOVM for pt letting her know I am working on her Xolair injection. Will let her know when I here something back. Nothing further needed at this time.

## 2016-12-20 ENCOUNTER — Ambulatory Visit
Admission: RE | Admit: 2016-12-20 | Discharge: 2016-12-20 | Disposition: A | Discharge status: Home / Self Care | Payer: Medicare Other | Source: Ambulatory Visit | Attending: Family Medicine | Admitting: Family Medicine

## 2016-12-20 DIAGNOSIS — J301 Allergic rhinitis due to pollen: Secondary | ICD-10-CM | POA: Diagnosis not present

## 2016-12-20 DIAGNOSIS — Z1231 Encounter for screening mammogram for malignant neoplasm of breast: Secondary | ICD-10-CM | POA: Diagnosis not present

## 2016-12-25 DIAGNOSIS — J301 Allergic rhinitis due to pollen: Secondary | ICD-10-CM | POA: Diagnosis not present

## 2017-01-01 DIAGNOSIS — J301 Allergic rhinitis due to pollen: Secondary | ICD-10-CM | POA: Diagnosis not present

## 2017-01-10 DIAGNOSIS — J301 Allergic rhinitis due to pollen: Secondary | ICD-10-CM | POA: Diagnosis not present

## 2017-01-18 DIAGNOSIS — J301 Allergic rhinitis due to pollen: Secondary | ICD-10-CM | POA: Diagnosis not present

## 2017-01-19 ENCOUNTER — Other Ambulatory Visit: Payer: Self-pay | Admitting: Family Medicine

## 2017-01-22 ENCOUNTER — Encounter: Payer: Self-pay | Admitting: Internal Medicine

## 2017-01-22 ENCOUNTER — Ambulatory Visit (INDEPENDENT_AMBULATORY_CARE_PROVIDER_SITE_OTHER): Payer: Medicare Other | Admitting: Internal Medicine

## 2017-01-22 VITALS — BP 140/72 | HR 86 | Wt 284.0 lb

## 2017-01-22 DIAGNOSIS — J45909 Unspecified asthma, uncomplicated: Secondary | ICD-10-CM | POA: Diagnosis not present

## 2017-01-22 NOTE — Patient Instructions (Signed)
START WORK UP FOR [facility]

## 2017-01-22 NOTE — Progress Notes (Signed)
MRN# [id number] Rachael Silva 18-Dec-1945   CC: Chief Complaint  Patient presents with  . Follow-up    breathing doing well:   synopsis: 71 year old female past medical history of asthma. Being followed by pulmonary for chronic eosinophilic pneumonia, status post bronchoscopy in December 2016 with negative cultures for AFB and fungus. Transbronchial biopsy-positive for eosinophilic infiltrate. Treated with steroids unable to wean since  October 2015.   Events since last clinic visit: Patient presents today for follow-up visit of her ABPA, asthma, chronic prednisone use secondary to lung disease. Patient states today that she is doing well, she is currently on prednisone 12.5 mg daily, and receiving allergy shots once weekly. Since her last visit she's not had any further exacerbations of eosinophilic pneumonia or ABPA. Also, at her last visit we discussed Xolair and we are currently awaiting approval. I have dicussed alertnative therapy with Fasenra No acute signs of infection at this time   Review of Systems: Gen:  Denies  fever, sweats, chills HEENT: Denies blurred vision, double vision, ear pain, eye pain, hearing loss, nose bleeds, sore throat Cvc:  No dizziness, chest pain or heaviness Resp:   Admits WU:JWJXB, DOE (stable), nasal congestion Gi: Denies swallowing difficulty, stomach pain, nausea or vomiting, diarrhea, constipation, bowel incontinence Gu:  Denies bladder incontinence, burning urine Ext:   No Joint pain, stiffness or swelling Skin: No skin rash, easy bruising or bleeding or hives Endoc:  No polyuria, polydipsia , polyphagia or weight change Other:  All other systems negative  Allergies:  Erythromycin  Physical Examination:  VS: BP 140/72 (BP Location: Left Arm, Cuff Size: Normal)   Pulse 86   Wt 284 lb (128.8 kg)   SpO2 98%   BMI 44.48 kg/m   General Appearance: No distress  HEENT: PERRLA, no ptosis, no other lesions noticed. No bruits Pulmonary:normal  breath sounds., diaphragmatic excursion normal.No wheezing, No rales   Cardiovascular:  Normal S1,S2.  No m/r/g.     Abdomen:Exam: Benign, Soft, non-tender, No masses  Skin:   warm, no rashes, no ecchymosis  Extremities: normal, no cyanosis, clubbing, warm with normal capillary refill.     (The following images and results were reviewed by Dr. Dema Severin on 01/22/2017). CT Chest 05/04/16 FINDINGS: Cardiovascular: Scattered aortic hand coronary artery calcifications. Stable calcification within the left ventricle. The Mediastinum/Nodes: Pretracheal lymph node has a short axis diameter of 9 mm compared with 11 mm previously. Other smaller scattered mediastinal lymph nodes. No axillary adenopathy or visible hilar adenopathy. Lungs/Pleura: Linear areas of scarring in the lingula and both lower lobes at the lung bases. Bochdalek's hernia noted on the right containing fat, stable. No confluent airspace opacities otherwise. No effusions. Upper Abdomen: Imaging into the upper abdomen shows no acute findings. Musculoskeletal: Chest wall soft tissues are unremarkable. No acute bony abnormality or focal bone lesion. IMPRESSION: Areas of linear scarring in the lung bases bilaterally. Right Bochdalek's type diaphoretic hernia containing fat, stable. Scattered coronary artery and aortic calcifications. No acute findings.  LABS Results for MARCHELLE, Silva (MRN [id number]) as of 08/01/2016 11:16  Ref. Range 07/02/2014 13:00 08/05/2015 15:12 03/30/2016 15:11  IgE (Immunoglobulin E), Serum Latest Ref Range: 0 - 100 IU/mL 2,401 (H) 1,253 (H) 1,180 (H)   Results for Rachael Silva (MRN [id number]) as of 08/01/2016 11:16  Ref. Range 08/05/2015 15:12 09/16/2015 12:46 03/30/2016 15:11 05/03/2016 08:42 07/26/2016 15:31  Eosinophil Latest Units: % 5.4 (H) 3   Assessment and Plan: 71 year old female with chronic cough, recurrent URIs, being  treated as ABPA/chronic eosinophilic pneumonia, unable to wean her  steroids, now with starting Xolair.   Updated Medication List Outpatient Encounter Prescriptions as of 01/22/2017  Medication Sig  . albuterol (ACCUNEB) 0.63 MG/3ML nebulizer solution Take 1 ampule by nebulization every 6 (six) hours as needed.    Marland Kitchen azithromycin (ZITHROMAX Z-PAK) 250 MG tablet Use as directed  . B Complex-C-Folic Acid (STRESS B COMPLEX PO) Take 1 tablet by mouth daily.  . Calcium Carbonate-Vitamin D (CALTRATE 600+D PO) Take 1 tablet by mouth 2 (two) times daily.  Marland Kitchen co-enzyme Q-10 30 MG capsule Take 200 mg by mouth daily.  Marland Kitchen EPIPEN 2-PAK 0.3 MG/0.3ML SOAJ injection See admin instructions.  . fexofenadine (ALLEGRA ALLERGY) 60 MG tablet Take 60 mg by mouth daily.   . Fluticasone-Salmeterol (ADVAIR DISKUS) 250-50 MCG/DOSE AEPB Inhale 1 puff into the lungs 2 (two) times daily.   . furosemide (LASIX) 20 MG tablet TAKE 1 TABLET (20 MG TOTAL) BY MOUTH DAILY.  Marland Kitchen gabapentin (NEURONTIN) 100 MG capsule Take 1 capsule (100 mg total) by mouth 3 (three) times daily. For menopausal sweating  . GARLIC PO Take 1 capsule by mouth daily.  Marland Kitchen levothyroxine (SYNTHROID, LEVOTHROID) 50 MCG tablet Take 1 tablet (50 mcg total) by mouth daily.  . metFORMIN (GLUCOPHAGE) 500 MG tablet Take 0.5 tablets (250 mg total) by mouth 2 (two) times daily with a meal.  . montelukast (SINGULAIR) 10 MG tablet Take 1 tablet (10 mg total) by mouth daily.  . Multiple Vitamins-Minerals (CENTRUM ADULTS PO) Take 1 tablet by mouth daily.  Marland Kitchen omeprazole (PRILOSEC) 40 MG capsule Take 1 capsule (40 mg total) by mouth daily. (Patient taking differently: Take 20 mg by mouth daily. )  . ONE TOUCH ULTRA TEST test strip USE TO TEST TWICE DAILY DX E09.9  . ONETOUCH DELICA LANCETS 33G MISC USE TO TEST SUGARS TWICE DAILY DX E09.9  . predniSONE (DELTASONE) 5 MG tablet Take 2.5 tablets (12.5 mg total) by mouth daily with breakfast. Until follow up.  . VENTOLIN HFA 108 (90 Base) MCG/ACT inhaler TAKE 1 TO 2 PUFFS EVERY 6 HOURS AS NEEDED  .  [DISCONTINUED] traMADol (ULTRAM) 50 MG tablet Take 1 tablet (50 mg total) by mouth every 12 (twelve) hours as needed for severe pain (with caution of sedation).   No facility-administered encounter medications on file as of 01/22/2017.    Assessment and Plan: 71 year old female with chronic cough, recurrent URIs, being treated as ABPA/chronic eosinophilic pneumonia, unable to wean her steroids, now with starting Xolair or Fasenra Asthma, chronic Recurrent exacerbation during the first half of 2015 with elevated serum eosinophils and abnormal CT  Aspergillus IgE panel neg w/ low positive titers for Asp Nidulans, Flavus and Terreus and amstel.  She has an elevated total IgE level, CT findings with mild bronchiectasis, groundglass opacities-given her history of asthma and his current lab and radiographic abnormalities there is a high suspicion for ABPA for which she has been on steroids for during the past 2 years, with inability to wean off. Current dose is at 12.5 mg of prednisone-this seems to be helping her asthma at this time   Currently we are trying to taper her prednisone dose to the lowest possible effective dose that gives her the greatest clinical outcome. Patient got down to 7.5 mg every other day at one point, but is still having moderate amount of symptoms of cough, runny nose, sputum production, and shortness of breath.  today's visit her prednisone is 12.5 mg daily  Eosinophil % =3 IgE=1180   Awaiting approval for Xolair, but will also sign up for Fasenra( IL inhibitor,Monoclonal AB)  Plan - Continue Advair as directed - prednisone 12.5 mg daily until follow up visit -plan is to start  Xolair or Fasenra, whichever gets approved the fastest     ABPA (allergic bronchopulmonary aspergillosis) Again this is a working diagnosis, other differentials include hypersensitivity pneumonitis, eosinophilic pneumonias, NSIP, BOOP. All of these diagnoses are treated with high-dose steroids  for a prolonged period of time. She does have significant clinical improvement once steroids were initiated, with rapid decline when steroids are terminated. Her further workup with Aspergillus skin testing, IgG levels, IgA levels were within normal limits.  The results of the skin testing and immunoglobulin testing were discussed in detail with the patient, they reviewed by me personally, and the patient was given a copy of her results.  Patient educated on side effects of steroids (weight gain, wereretention, increased appetite, muscle weakness, bone degradation, dysfunctional calcium levels, elevated blood glucose levels, anxiety/agitation at night, decreased immune).  Will continue to taper steroid doses to the lowest possible dose that will provide symptomatic relief, the premise is to completely taper steroids off. Given her negative Aspergillus skin testing (this was done while patient on high-dose steroids, which could also give a false negative read), at some point in the future if steroids can be tapered off or patient has relapse of symptoms we will repeat skin testing.  Patient educated on the current diagnosis, she understands that this is not an active infection, it is most likely a hyperreactive reaction to antigen exposure. She does not need/require antifungal treatment at this time. This is her initial treatment course, if she has refractory or recurrent symptoms will consider adding antifungals at that time.  Bronchoscopy 09/2015: All cultures including fungal, bacterial, AFB a currently negative to date. Transbronchial biopsy showed eosinophilic infiltration consistent with eosinophilic pneumonia in 2016 Bronhoscopy with BAL 03/2016: negative cultures.   Plan:  - prednisone 12.5mg  daily until follow up visit.  Continue allergy regiment Continue Advair Continue allergen avoidance  avoid sick contacts Xolair or Fasenra initiation    EP (eosinophilic pneumonia) (HCC)  Chronic  eosinophilic pneumonia   -diagnosed by history, symptoms, transbronchial biopsy and bronchoscopy on 09/24/2015   At prior visit patient stated that she has a hx of nasal polyps, rhinosinusitis and has been on [employer] powder for a number of years.   Discussed cessation of BC powder and all forms of aspirin with the patient, currently with mild improvement, but her prednisone dose has also increased In the interm we will consider anti-IgE therapy (Xolair) or Anti-IL5 therapy (Nucala) workup, given her difficult to control asthma and need for chronic steroid.   Plan: -Prednisone 12.5  mg daily until follow up.  Avoid any allergens Continue Advair Continue allergy regiment and allergy shot Avoid sick contacts.  OSA on CPAP Cont with CPAP nightly CPAP 11cm H20   Patient satisfied with Plan of action and management. All questions answered  Lucie Leather, M.D.  [facility]  Medical Director [facility] Medical Director [facility] Cardio-Pulmonary Department

## 2017-01-24 ENCOUNTER — Telehealth: Payer: Self-pay | Admitting: Internal Medicine

## 2017-01-24 NOTE — Telephone Encounter (Signed)
Spoke with DK and he states he wants to try and get the pt approved for Fasenra at this time instead of Xolair. Will fax in paperwork for Fasenra per DK. Nothing further needed at this time.

## 2017-01-24 NOTE — Telephone Encounter (Signed)
Jeffery from [employer] calling stating for patient to have Xolair, they were approved  But We would have to "Buy and bill" for this medication   Please advise.

## 2017-01-29 DIAGNOSIS — J301 Allergic rhinitis due to pollen: Secondary | ICD-10-CM | POA: Diagnosis not present

## 2017-01-30 ENCOUNTER — Telehealth: Payer: Self-pay | Admitting: Internal Medicine

## 2017-01-30 DIAGNOSIS — J301 Allergic rhinitis due to pollen: Secondary | ICD-10-CM | POA: Diagnosis not present

## 2017-01-30 NOTE — Telephone Encounter (Signed)
Will from [employer] to just verify if we received patient benefit coverage summary Please call back if we have not

## 2017-01-30 NOTE — Telephone Encounter (Signed)
LM for Will letting him know that we won't be doing the Fasenra at this time. Called the pt and informed her we need to stay with doing the Xolair due to billing for Fasnra and that she should be contacted from the [place] office to get scheduled. Nothing further needed at this time.

## 2017-01-30 NOTE — Telephone Encounter (Signed)
Waiting to hear back from [place] at [place] office in regards to scheduling Xolair for this pt.

## 2017-02-05 ENCOUNTER — Telehealth: Payer: Self-pay | Admitting: Internal Medicine

## 2017-02-05 NOTE — Telephone Encounter (Signed)
Called pt. And lmom at home. Please call [employer] and gave her the #. Waiting on pt. To call [employer] so they can fax or call us.

## 2017-02-08 DIAGNOSIS — J301 Allergic rhinitis due to pollen: Secondary | ICD-10-CM | POA: Diagnosis not present

## 2017-02-13 ENCOUNTER — Other Ambulatory Visit: Payer: Self-pay | Admitting: *Deleted

## 2017-02-13 MED ORDER — PREDNISONE 5 MG PO TABS: 12.5000 mg | ORAL_TABLET | Freq: Every day | ORAL | 1 refills | Status: DC

## 2017-02-14 DIAGNOSIS — J301 Allergic rhinitis due to pollen: Secondary | ICD-10-CM | POA: Diagnosis not present

## 2017-02-20 ENCOUNTER — Telehealth: Payer: Self-pay | Admitting: Internal Medicine

## 2017-02-20 ENCOUNTER — Other Ambulatory Visit: Payer: Self-pay | Admitting: Family Medicine

## 2017-02-20 NOTE — Telephone Encounter (Signed)
#   vials:6 Ordered date:02/20/17 Shipping Date:02/20/17

## 2017-02-21 NOTE — Telephone Encounter (Addendum)
#   Vials:6 Arrival Date:02/21/17 Lot #:[id number]  Exp Date:10/21

## 2017-02-22 DIAGNOSIS — J301 Allergic rhinitis due to pollen: Secondary | ICD-10-CM | POA: Diagnosis not present

## 2017-02-22 NOTE — Telephone Encounter (Signed)
Called pt. To let her know her xolair came in. I scheduled her 1st xolair appt. For 02/28/17. Pt is aware of 2 hr. Wait. She already has an Epi-Pen, I ask her to be sure and bring it with her. Nothing further needed.

## 2017-02-26 ENCOUNTER — Telehealth: Payer: Self-pay | Admitting: Family Medicine

## 2017-02-26 ENCOUNTER — Ambulatory Visit (INDEPENDENT_AMBULATORY_CARE_PROVIDER_SITE_OTHER)
Admission: RE | Admit: 2017-02-26 | Discharge: 2017-02-26 | Disposition: A | Discharge status: Home / Self Care | Payer: Medicare Other | Source: Ambulatory Visit | Attending: Family Medicine | Admitting: Family Medicine

## 2017-02-26 ENCOUNTER — Encounter: Payer: Self-pay | Admitting: Family Medicine

## 2017-02-26 ENCOUNTER — Ambulatory Visit (INDEPENDENT_AMBULATORY_CARE_PROVIDER_SITE_OTHER): Payer: Medicare Other | Admitting: Family Medicine

## 2017-02-26 VITALS — BP 122/64 | HR 75 | Temp 98.5°F | Ht 67.0 in | Wt 289.8 lb

## 2017-02-26 DIAGNOSIS — J4540 Moderate persistent asthma, uncomplicated: Secondary | ICD-10-CM | POA: Diagnosis not present

## 2017-02-26 DIAGNOSIS — M25511 Pain in right shoulder: Secondary | ICD-10-CM

## 2017-02-26 DIAGNOSIS — J8281 Chronic eosinophilic pneumonia: Secondary | ICD-10-CM

## 2017-02-26 DIAGNOSIS — E099 Drug or chemical induced diabetes mellitus without complications: Secondary | ICD-10-CM

## 2017-02-26 DIAGNOSIS — T380X5A Adverse effect of glucocorticoids and synthetic analogues, initial encounter: Secondary | ICD-10-CM

## 2017-02-26 DIAGNOSIS — R42 Dizziness and giddiness: Secondary | ICD-10-CM

## 2017-02-26 DIAGNOSIS — B4481 Allergic bronchopulmonary aspergillosis: Secondary | ICD-10-CM

## 2017-02-26 DIAGNOSIS — J82 Pulmonary eosinophilia, not elsewhere classified: Secondary | ICD-10-CM | POA: Diagnosis not present

## 2017-02-26 HISTORY — DX: Dizziness and giddiness: R42

## 2017-02-26 NOTE — Telephone Encounter (Signed)
Ref done  Will route to [facility] 

## 2017-02-26 NOTE — Assessment & Plan Note (Signed)
Soon to begin Xolair Hoping for success

## 2017-02-26 NOTE — Assessment & Plan Note (Signed)
Improved   Soon to start Xolair for asthma

## 2017-02-26 NOTE — Assessment & Plan Note (Signed)
Per hx sounds like vertigo (positional spinning feeling with nausea) Now resolved Reassuring exam with L TM eff Adv use of flonase to help this (may be the cause) Handout given Update if symptoms re occur

## 2017-02-26 NOTE — Progress Notes (Signed)
Subjective:    Patient ID: Rachael Silva, female    DOB: 07-18-1946, 71 y.o.   MRN: [id number]  HPI Here for swollen arm and also f/u of chronic medical problems and dizziness  R arm is swollen and hurting for a while - a few months  No injury  Noticed pain before swelling  Worse if she sleeps on that side  (is L handed) but uses that hand with computer - working on computer is worse  Points to shoulder and upper arm  Anti inflammatories are not helping  Used some ice -helps just briefly  Some pain with external and external rotation   Worried about getting a shot in that arm   Also woke up dizzy on Thursday with nausea (vomited)  Worse with movement/pos change  Then got a headache  Blood glucose was 109  Improved now    Wt Readings from Last 3 Encounters:  02/26/17 289 lb 12 oz (131.4 kg)  01/22/17 284 lb (128.8 kg)  12/01/16 278 lb (126.1 kg)   bmi 45.3  Getting Xolair  injection for severe asthma with pulmonary - first inj scheduled 5/23 Also hx of eosinophilic pneumonia and ABPA and OSA Sees Dr Belia Heman  Steroid induced DM Lab Results  Component Value Date   HGBA1C 6.5 11/22/2016  takes metformin 250 bid  Up from 6.3 Diet has not been good  She would like to loose weight   Prednisone 12.5 mg  Hopes to get to cut it back if the new drug works   Patient Active Problem List   Diagnosis Date Noted  . Dizziness 02/26/2017  . Right shoulder pain 02/26/2017  . Hyperlipidemia, mild 12/03/2016  . Night sweats 12/01/2016  . Encounter for screening mammogram for breast cancer 05/17/2016  . Exposure to communicable disease 05/17/2016  . OSA on CPAP 05/11/2016  . Syncopal episodes 05/11/2016  . Chronic cough   . EP (eosinophilic pneumonia) (HCC)   . Memory impairment 05/11/2015  . Excessive daytime sleepiness 05/11/2015  . Snoring 05/05/2015  . Morbid obesity (HCC) 05/05/2015  . Steroid-induced diabetes mellitus (HCC) 11/11/2014  . Glucocorticoids and synthetic  analogues causing adverse effect in therapeutic use 08/27/2014  . Long term current use of systemic steroids 08/27/2014  . Estrogen deficiency 08/27/2014  . ABPA (allergic bronchopulmonary aspergillosis) (HCC) 07/08/2014  . Cough 06/18/2014  . Asthma, chronic 06/18/2014  . Productive cough 05/27/2014  . Bursitis, shoulder 12/09/2013  . Abnormal mammogram with microcalcification 06/06/2011  . Other screening mammogram 04/17/2011  . Routine general medical examination at a health care facility 04/17/2011  . Hypothyroidism 06/01/2009  . EDEMA 03/03/2009  . SINUSITIS, CHRONIC 08/18/2008  . ARTHRITIS, RIGHT KNEE 04/15/2008  . Weight gain due to medication 02/18/2008  . HOT FLASHES 02/18/2008  . GERD (gastroesophageal reflux disease) 08/01/2007  . HYPERCHOLESTEROLEMIA 05/15/2007  . ALLERGIC RHINITIS 05/15/2007  . ASTHMA 05/15/2007  . PERIODONTAL DISEASE 05/15/2007  . MURMUR 05/15/2007  . URINARY INCONTINENCE 05/15/2007   Past Medical History:  Diagnosis Date  . Allergy    allergic rhinitis  . Angioedema 02/2006  . Arthritis    OA of knees  . Asthma   . Complication of anesthesia    apnea- sleep  . Frequent headaches   . Hiatal hernia   . Hypertension   . Hypothyroid   . Labile blood pressure   . Lower extremity edema   . Lung disease   . Pneumonia   . Pneumonia, eosinophilic (HCC)   . Shortness  of breath dyspnea   . Sleep apnea   . Urinary incontinence   . Wheezing    Past Surgical History:  Procedure Laterality Date  . BREAST BIOPSY Left 2013   NEG  . BREAST SURGERY  1966   breast biopsy  . CESAREAN SECTION     x2  . FLEXIBLE BRONCHOSCOPY N/A 09/16/2015   Procedure: FLEXIBLE BRONCHOSCOPY WITH FLURO ;  Surgeon: Stephanie Acre, MD;  Location: [facility] ORS;  Service: Pulmonary;  Laterality: N/A;   Social History  Substance Use Topics  . Smoking status: Never Smoker  . Smokeless tobacco: Never Used  . Alcohol use No   Family History  Problem Relation Age of Onset    . Diabetes Mother        pre-diabetic  . Heart disease Mother        ? heart disease  . Emphysema Mother   . Thyroid disease Mother   . Diabetes Maternal Uncle   . Breast cancer Neg Hx    Allergies  Allergen Reactions  . Erythromycin     REACTION: feels sick   Current Outpatient Prescriptions on File Prior to Visit  Medication Sig Dispense Refill  . albuterol (ACCUNEB) 0.63 MG/3ML nebulizer solution Take 1 ampule by nebulization every 6 (six) hours as needed.      . B Complex-C-Folic Acid (STRESS B COMPLEX PO) Take 1 tablet by mouth daily.    . Calcium Carbonate-Vitamin D (CALTRATE 600+D PO) Take 1 tablet by mouth 2 (two) times daily.    Marland Kitchen co-enzyme Q-10 30 MG capsule Take 200 mg by mouth daily.    Marland Kitchen EPIPEN 2-PAK 0.3 MG/0.3ML SOAJ injection See admin instructions.  99  . fexofenadine (ALLEGRA ALLERGY) 60 MG tablet Take 60 mg by mouth daily.     . Fluticasone-Salmeterol (ADVAIR DISKUS) 250-50 MCG/DOSE AEPB Inhale 1 puff into the lungs 2 (two) times daily.     . furosemide (LASIX) 20 MG tablet TAKE 1 TABLET (20 MG TOTAL) BY MOUTH DAILY. 30 tablet 11  . gabapentin (NEURONTIN) 100 MG capsule Take 1 capsule (100 mg total) by mouth 3 (three) times daily. For menopausal sweating 90 capsule 11  . GARLIC PO Take 1 capsule by mouth daily.    Marland Kitchen levothyroxine (SYNTHROID, LEVOTHROID) 50 MCG tablet Take 1 tablet (50 mcg total) by mouth daily. 90 tablet 2  . metFORMIN (GLUCOPHAGE) 500 MG tablet Take 0.5 tablets (250 mg total) by mouth 2 (two) times daily with a meal. 30 tablet 11  . montelukast (SINGULAIR) 10 MG tablet Take 1 tablet (10 mg total) by mouth daily. 30 tablet 11  . Multiple Vitamins-Minerals (CENTRUM ADULTS PO) Take 1 tablet by mouth daily.    Marland Kitchen omeprazole (PRILOSEC) 40 MG capsule Take 1 capsule (40 mg total) by mouth daily. (Patient taking differently: Take 20 mg by mouth daily. ) 30 capsule 5  . ONE TOUCH ULTRA TEST test strip USE TO TEST TWICE DAILY DX E09.9 50 each 0  . ONETOUCH  DELICA LANCETS 33G MISC USE TO TEST SUGARS TWICE DAILY DX E09.9 100 each 0  . predniSONE (DELTASONE) 5 MG tablet Take 2.5 tablets (12.5 mg total) by mouth daily with breakfast. Until follow up. 90 tablet 1  . VENTOLIN HFA 108 (90 Base) MCG/ACT inhaler TAKE 1 TO 2 PUFFS EVERY 6 HOURS AS NEEDED 18 g 11   No current facility-administered medications on file prior to visit.     Review of Systems Review of Systems  Constitutional: Negative for  fever, appetite change,  and unexpected weight change.  Eyes: Negative for pain and visual disturbance.  Respiratory: Negative for cough and shortness of breath.   Cardiovascular: Negative for cp or palpitations    Gastrointestinal: Negative for nausea, diarrhea and constipation.  Genitourinary: Negative for urgency and frequency.  Skin: Negative for pallor or rash   MSK pos for R shoulder pain with swelling  Neurological: Negative for weakness, light-headedness, numbness and headaches. (dizziness is resolved)  Hematological: Negative for adenopathy. Does not bruise/bleed easily.  Psychiatric/Behavioral: Negative for dysphoric mood. The patient is not nervous/anxious.         Objective:   Physical Exam  Constitutional: She appears well-developed and well-nourished. No distress.  Morbidly obese and well appearing  HENT:  Head: Normocephalic and atraumatic.  Right Ear: External ear normal.  Mouth/Throat: Oropharynx is clear and moist.  L TM - effusion  R TM- dull No sinus tenderness  Eyes: Conjunctivae and EOM are normal. Pupils are equal, round, and reactive to light.  No nystagmus today  Neck: Normal range of motion. Neck supple. No JVD present. Carotid bruit is not present. No thyromegaly present.  Cardiovascular: Normal rate, regular rhythm, normal heart sounds and intact distal pulses.  Exam reveals no gallop.   Pulmonary/Chest: Effort normal and breath sounds normal. No respiratory distress. She has no wheezes. She has no rales.  No  crackles No wheezing   bs are mildly distant  Abdominal: Soft. Bowel sounds are normal. She exhibits no distension, no abdominal bruit and no mass. There is no tenderness.  Musculoskeletal: She exhibits no edema.       Right shoulder: She exhibits tenderness and bony tenderness. She exhibits normal range of motion, no swelling, no effusion, no crepitus, normal pulse and normal strength.  I do not appreciate swelling of shoulder  Tender over acromion and post/lat shoulder and deltoid area  Pain with int/ext rot Pos Neer Neg [place]  Nl grip and rom otherwise  No neuro changes   Lymphadenopathy:    She has no cervical adenopathy.  Neurological: She is alert. She has normal reflexes. She displays no atrophy and no tremor. No cranial nerve deficit or sensory deficit. She exhibits normal muscle tone. She displays a negative Romberg sign. Coordination and gait normal.  No focal neuro changes   Skin: Skin is warm and dry. No rash noted. No pallor.  Psychiatric: She has a normal mood and affect.          Assessment & Plan:   Problem List Items Addressed This Visit      Respiratory   ABPA (allergic bronchopulmonary aspergillosis) (HCC) - Primary    Planning to start Xolair injection and hopeful it will help Seeing Dr Racheal Patches      Asthma, chronic    Soon to begin Xolair Hoping for success      EP (eosinophilic pneumonia) (HCC)    Improved   Soon to start Xolair for asthma         Endocrine   Steroid-induced diabetes mellitus (HCC)    In the setting of mobid obesity as well  On metformin  Lab Results  Component Value Date   HGBA1C 6.5 11/22/2016   Would hope with upcoming xolair injections she may be able to wean steroids  Diet disc in length and need for wt loss        Other   Dizziness    Per hx sounds like vertigo (positional spinning feeling with nausea) Now resolved Reassuring  exam with L TM eff Adv use of flonase to help this (may be the  cause) Handout given Update if symptoms re occur      Morbid obesity (HCC)    Discussed how this problem influences overall health and the risks it imposes  Reviewed plan for weight loss with lower calorie diet (via better food choices and also portion control or program like weight watchers) and exercise building up to or more than 30 minutes 5 days per week including some aerobic activity   Pt is getting more motivated        Right shoulder pain    Suspect tendonitis xr to look for OA or spurs  Pain on int rotation  Given handout -rom exercises Use ice  Adv from there       Relevant Orders   DG Shoulder Right

## 2017-02-26 NOTE — Patient Instructions (Addendum)
I think you had an attack of vertigo (likely from allergies and fluid in ear) A nasal spray over the counter (flonase) can help open the sinus and ear system  (use for 2 weeks)  If symptoms return or worsen let us know  When you are dizzy- do not change position quickly (move slow)-until you are feeling better  I think you may have tendonitis of the shoulder  We will do an xray today to make sure there is no arthritis  Keep moving it-do not lift heavy things or over work it  Use ice any time you can  We will get a reading later today and let you know   Diabetes is fairly stable  Start working on diet and exercise and weight loss when you are ready

## 2017-02-26 NOTE — Assessment & Plan Note (Signed)
Suspect tendonitis xr to look for OA or spurs  Pain on int rotation  Given handout -rom exercises Use ice  Adv from there

## 2017-02-26 NOTE — Assessment & Plan Note (Signed)
Planning to start Xolair injection and hopeful it will help Seeing Dr Racheal PatchesKasa/pulmonary

## 2017-02-26 NOTE — Assessment & Plan Note (Signed)
In the setting of mobid obesity as well  On metformin  Lab Results  Component Value Date   HGBA1C 6.5 11/22/2016   Would hope with upcoming xolair injections she may be able to wean steroids  Diet disc in length and need for wt loss

## 2017-02-26 NOTE — Assessment & Plan Note (Signed)
>>  ASSESSMENT AND PLAN FOR RIGHT SHOULDER PAIN WRITTEN ON 02/26/2017  1:54 PM BY TOWER, MARNE A, MD  Suspect tendonitis xr to look for OA or spurs  Pain on int rotation  Given handout -rom exercises Use ice  Adv from there

## 2017-02-26 NOTE — Telephone Encounter (Signed)
-----   Message from Shon MilletShapale M [place], [place] sent at 02/26/2017  4:52 PM EDT ----- Pt notified of xray results and Dr. Royden Purlower's comments. Pt agrees with referral to PT, please put referral in and I advise pt [facility]t

## 2017-02-26 NOTE — Assessment & Plan Note (Signed)
Discussed how this problem influences overall health and the risks it imposes  Reviewed plan for weight loss with lower calorie diet (via better food choices and also portion control or program like weight watchers) and exercise building up to or more than 30 minutes 5 days per week including some aerobic activity   Pt is getting more motivated

## 2017-02-27 NOTE — Telephone Encounter (Signed)
Appt made and patient aware.MK °

## 2017-02-28 ENCOUNTER — Ambulatory Visit (INDEPENDENT_AMBULATORY_CARE_PROVIDER_SITE_OTHER): Payer: Medicare Other

## 2017-02-28 ENCOUNTER — Ambulatory Visit: Payer: Medicare Other | Admitting: Family Medicine

## 2017-02-28 DIAGNOSIS — J4540 Moderate persistent asthma, uncomplicated: Secondary | ICD-10-CM

## 2017-03-01 MED ORDER — OMALIZUMAB 150 MG ~~LOC~~ SOLR
375.0000 mg | SUBCUTANEOUS | Status: DC
  Administered 2017-02-28: 375 mg via SUBCUTANEOUS

## 2017-03-01 NOTE — Progress Notes (Signed)
patient came in on 02/28/2017 to receive a Xolair injection. The patient is given 375mg  every 14 days. Due to each vial equalling 150mg , 75mg  of medication was wasted.  Xolair injection documentation and charges entered by Dorethea ClanAshley Caulfield, RMA, based on injection sheet filled out by Dimas Millinammy Scott per office protocol.

## 2017-03-06 DIAGNOSIS — M25511 Pain in right shoulder: Secondary | ICD-10-CM | POA: Diagnosis not present

## 2017-03-07 DIAGNOSIS — J301 Allergic rhinitis due to pollen: Secondary | ICD-10-CM | POA: Diagnosis not present

## 2017-03-12 DIAGNOSIS — J301 Allergic rhinitis due to pollen: Secondary | ICD-10-CM | POA: Diagnosis not present

## 2017-03-13 DIAGNOSIS — M25511 Pain in right shoulder: Secondary | ICD-10-CM | POA: Diagnosis not present

## 2017-03-14 ENCOUNTER — Ambulatory Visit (INDEPENDENT_AMBULATORY_CARE_PROVIDER_SITE_OTHER): Payer: Medicare Other

## 2017-03-14 DIAGNOSIS — J4540 Moderate persistent asthma, uncomplicated: Secondary | ICD-10-CM

## 2017-03-15 MED ORDER — OMALIZUMAB 150 MG ~~LOC~~ SOLR
375.0000 mg | SUBCUTANEOUS | Status: DC
  Administered 2017-03-14: 375 mg via SUBCUTANEOUS

## 2017-03-15 NOTE — Progress Notes (Signed)
Xolair injection documentation and charges entered by Dorethea ClanAshley Caulfield, RMA, based on injection sheet filled out by [name] per office protocol.   patient came in on 03/14/2017 to receive a Xolair injection. The patient is given 375mg  every 14 days. Due to each vial equalling 150mg , 75mg  of medication was wasted.

## 2017-03-20 ENCOUNTER — Telehealth: Payer: Self-pay | Admitting: Internal Medicine

## 2017-03-20 DIAGNOSIS — M25511 Pain in right shoulder: Secondary | ICD-10-CM | POA: Diagnosis not present

## 2017-03-20 NOTE — Telephone Encounter (Signed)
I called to have 6 more xolair added on b/c I didn't realize she was buy and bill until I looked at the xolair flow sheet( I need to put her on the buy & bill flow sheet so that doesn't happen again.) The rep. Didn't come back, I was on hold for over 11 mins.. Hopefully she was able to add these 6 to the original order. If we get 20 xolair tomorrow she did if not we'll have 14. If we only have 14 I'll order the other six next week with that order.  # vials:6 Ordered date:03/20/17 Shipping Date:03/20/17

## 2017-03-21 DIAGNOSIS — J301 Allergic rhinitis due to pollen: Secondary | ICD-10-CM | POA: Diagnosis not present

## 2017-03-26 ENCOUNTER — Telehealth: Payer: Self-pay | Admitting: Internal Medicine

## 2017-03-26 DIAGNOSIS — J301 Allergic rhinitis due to pollen: Secondary | ICD-10-CM | POA: Diagnosis not present

## 2017-03-26 NOTE — Telephone Encounter (Signed)
#   vials:6 Ordered date:03/26/17 Shipping Date:03/26/17

## 2017-03-27 NOTE — Telephone Encounter (Signed)
#   Vials:6 Arrival Date:03/27/17 Lot #[id number] Exp Date:11/21  

## 2017-03-27 NOTE — Telephone Encounter (Signed)
I forgot I created this encounter. I created another encounter and recorded the order and arrival. Closing, nothing further needed.

## 2017-03-28 ENCOUNTER — Ambulatory Visit (INDEPENDENT_AMBULATORY_CARE_PROVIDER_SITE_OTHER): Payer: Medicare Other

## 2017-03-28 DIAGNOSIS — J4520 Mild intermittent asthma, uncomplicated: Secondary | ICD-10-CM | POA: Diagnosis not present

## 2017-03-29 MED ORDER — OMALIZUMAB 150 MG ~~LOC~~ SOLR
375.0000 mg | SUBCUTANEOUS | Status: DC
  Administered 2017-03-28: 375 mg via SUBCUTANEOUS

## 2017-03-29 NOTE — Progress Notes (Signed)
Documentation of medication administration and charges of Xolair have been completed by Jaynee EaglesLindsay Tae Vonada, CMA based on the Xolair documentation sheet completed by [facility].  Rachael DanaDejuanna Weaber came in on 03/28/2017 to receive a Xolair injection. The patient is given 375mg  every 14 days. Due to each vial equalling 150mg , 75mg  of medication was wasted.

## 2017-04-02 DIAGNOSIS — J301 Allergic rhinitis due to pollen: Secondary | ICD-10-CM | POA: Diagnosis not present

## 2017-04-12 DIAGNOSIS — J301 Allergic rhinitis due to pollen: Secondary | ICD-10-CM | POA: Diagnosis not present

## 2017-04-16 ENCOUNTER — Other Ambulatory Visit: Payer: Self-pay | Admitting: Internal Medicine

## 2017-04-16 MED ORDER — PREDNISONE 5 MG PO TABS: 12.5000 mg | ORAL_TABLET | Freq: Every day | ORAL | 0 refills | Status: DC

## 2017-04-16 NOTE — Telephone Encounter (Signed)
Refill request came in from pharmacy.

## 2017-04-18 ENCOUNTER — Ambulatory Visit (INDEPENDENT_AMBULATORY_CARE_PROVIDER_SITE_OTHER): Payer: Medicare Other

## 2017-04-18 DIAGNOSIS — J4550 Severe persistent asthma, uncomplicated: Secondary | ICD-10-CM | POA: Diagnosis not present

## 2017-04-19 DIAGNOSIS — J301 Allergic rhinitis due to pollen: Secondary | ICD-10-CM | POA: Diagnosis not present

## 2017-04-19 MED ORDER — OMALIZUMAB 150 MG ~~LOC~~ SOLR
375.0000 mg | Freq: Once | SUBCUTANEOUS | Status: AC
Start: 2017-04-18 — End: 2017-04-18
  Administered 2017-04-18: 375 mg via SUBCUTANEOUS

## 2017-04-19 NOTE — Progress Notes (Signed)
Rachael Silva came in on 7.11.18 to receive a Xolair injection. The patient is given 375mg  every 28 days. Due to each vial equalling 150mg , 15mg  of medication was wasted.

## 2017-04-24 DIAGNOSIS — J301 Allergic rhinitis due to pollen: Secondary | ICD-10-CM | POA: Diagnosis not present

## 2017-04-25 ENCOUNTER — Other Ambulatory Visit: Payer: Self-pay | Admitting: Family Medicine

## 2017-04-26 DIAGNOSIS — J301 Allergic rhinitis due to pollen: Secondary | ICD-10-CM | POA: Diagnosis not present

## 2017-05-02 ENCOUNTER — Ambulatory Visit (INDEPENDENT_AMBULATORY_CARE_PROVIDER_SITE_OTHER): Payer: Medicare Other

## 2017-05-02 DIAGNOSIS — J4520 Mild intermittent asthma, uncomplicated: Secondary | ICD-10-CM | POA: Diagnosis not present

## 2017-05-03 ENCOUNTER — Telehealth: Payer: Self-pay | Admitting: Internal Medicine

## 2017-05-03 DIAGNOSIS — J301 Allergic rhinitis due to pollen: Secondary | ICD-10-CM | POA: Diagnosis not present

## 2017-05-03 MED ORDER — OMALIZUMAB 150 MG ~~LOC~~ SOLR
375.0000 mg | SUBCUTANEOUS | Status: DC
  Administered 2017-05-02: 375 mg via SUBCUTANEOUS

## 2017-05-03 NOTE — Telephone Encounter (Signed)
#   vials:6 Ordered date:05/03/17 Shipping Date:05/03/17

## 2017-05-03 NOTE — Progress Notes (Signed)
Documentation of medication administration and charges of Xolair have been completed by Jaynee EaglesLindsay Archana Eckman, CMA based on the Xolair documentation sheet [facility] by [facility].   Rachael DanaDejuanna Tingley came in on 05/02/2017 to receive a Xolair injection. The patient is given 375mg  every 14 days. Due to each vial equalling 150mg , 75mg  of medication was wasted.

## 2017-05-07 ENCOUNTER — Ambulatory Visit (INDEPENDENT_AMBULATORY_CARE_PROVIDER_SITE_OTHER): Payer: Medicare Other | Admitting: Internal Medicine

## 2017-05-07 ENCOUNTER — Encounter: Payer: Self-pay | Admitting: Internal Medicine

## 2017-05-07 VITALS — BP 180/88 | HR 95 | Ht 67.0 in | Wt 288.0 lb

## 2017-05-07 DIAGNOSIS — J301 Allergic rhinitis due to pollen: Secondary | ICD-10-CM | POA: Diagnosis not present

## 2017-05-07 DIAGNOSIS — J4530 Mild persistent asthma, uncomplicated: Secondary | ICD-10-CM | POA: Diagnosis not present

## 2017-05-07 NOTE — Telephone Encounter (Signed)
#   Vials:6 Arrival Date:05/07/17 Lot #[id number] Exp Date:09/2020

## 2017-05-07 NOTE — Progress Notes (Signed)
MRN# [id number] Rachael Silva 12-20-1945   CC: Chief Complaint  Patient presents with  . Asthma    SOB at times: can tell some difference with getting shots:   synopsis: 71 year old female past medical history of asthma. Being followed by pulmonary for chronic eosinophilic pneumonia, status post bronchoscopy in December 2016 with negative cultures for AFB and fungus. Transbronchial biopsy-positive for eosinophilic infiltrate. Treated with steroids unable to wean since  October 2015.   Events since last clinic visit: Patient presents today for follow-up visit of her ABPA, asthma, chronic prednisone use secondary to lung disease. Patient states today that she is doing well, she is currently on prednisone 12.5 mg daily, and receiving allergy shots once weekly. Since her last visit she's not had any further exacerbations of eosinophilic pneumonia or ABPA. She has been started on Xolair and she feels great No acute signs of infection at this time No wheezing no shortness of breath No dyspnea on exertion Her asthma seems to be under control at this time  Review of Systems: Gen:  Denies  fever, sweats, chills HEENT: Denies blurred vision, double vision, ear pain, eye pain, hearing loss, nose bleeds, sore throat Cvc:  No dizziness, chest pain or heaviness Resp:   No wheezes no shortness of breath Gi: Denies swallowing difficulty, stomach pain, nausea or vomiting, diarrhea, constipation, bowel incontinence Gu:  Denies bladder incontinence, burning urine Ext:   No Joint pain, stiffness or swelling Skin: No skin rash, easy bruising or bleeding or hives Endoc:  No polyuria, polydipsia , polyphagia or weight change Other:  All other systems negative  Allergies:  Erythromycin  Physical Examination:  VS: BP (!) 180/88 (BP Location: Left Arm, Cuff Size: Normal)   Pulse 95   Ht 5\' 7"  (1.702 m)   Wt 288 lb (130.6 kg)   SpO2 98%   BMI 45.11 kg/m   General Appearance: No distress  HEENT:  PERRLA, no ptosis, no other lesions noticed. No bruits Pulmonary:normal breath sounds., diaphragmatic excursion normal.No wheezing, No rales   Cardiovascular:  Normal S1,S2.  No m/r/g.     Abdomen:Exam: Benign, Soft, non-tender, No masses  Skin:   warm, no rashes, no ecchymosis  Extremities: normal, no cyanosis, clubbing, warm with normal capillary refill.     LABS Results for Rachael Silva, Rachael (MRN [id number]) as of 08/01/2016 11:16  Ref. Range 07/02/2014 13:00 08/05/2015 15:12 03/30/2016 15:11  IgE (Immunoglobulin E), Serum Latest Ref Range: 0 - 100 IU/mL 2,401 (H) 1,253 (H) 1,180 (H)   Results for Rachael Silva, Rachael (MRN [id number]) as of 08/01/2016 11:16  Ref. Range 08/05/2015 15:12 09/16/2015 12:46 03/30/2016 15:11 05/03/2016 08:42 07/26/2016 15:31  Eosinophil Latest Units: % 10 11 5  5.4 (H) 3        Assessment and Plan:   71 year old female with chronic cough, recurrent URIs, being treated as ABPA/chronic eosinophilic pneumonia, now  Xolair therapy  Asthma, chronic/chronic eosinophilic pneumonia -Patient has mild and persistent chronic asthma which seems to be well-controlled right now at this time with Xolair therapy I have advised patient to start to wean steroids and to assess respiratory status Eosinophil % =3 IgE=1180   Plan -Continue Xolair as prescribed Prednisone taper over the next 4 weeks advised to patient and to reassess respiratory symptoms -The plan for prednisone therapy is to decrease to 5 mg daily for 2 weeks and then 2.5 mg daily for 2 weeks and then off  Avoid any allergens Continue Advair daily Continue allergy regiment and allergy shot Avoid sick contacts.  ABPA (allergic bronchopulmonary aspergillosis) Patient educated on side effects of steroids (weight gain, wereretention, increased appetite, muscle weakness, bone degradation, dysfunctional calcium levels, elevated blood glucose levels, anxiety/agitation at night, decreased immune).  Will continue  to taper steroid doses to the lowest possible dose that will provide symptomatic relief-plan is to wean off in the next 4 weeks,  Patient educated on the current diagnosis, she understands that this is not an active infection, it is most likely a hyperreactive reaction to antigen exposure.  She does not need/require antifungal treatment at this time. This is her initial treatment course, if she has refractory or recurrent symptoms will consider adding antifungals at that time.  Bronchoscopy 09/2015: All cultures including fungal, bacterial, AFB a currently negative to date. Transbronchial biopsy showed eosinophilic infiltration consistent with eosinophilic pneumonia in 2016 Bronhoscopy with BAL 03/2016: negative cultures.      OSA on CPAP Cont with CPAP nightly CPAP 11cm H20   Patient satisfied with Plan of action and management. 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  [facility]  Medical Director [facility] Medical Director [facility]

## 2017-05-07 NOTE — Patient Instructions (Signed)
Prednisone 5 mg daily for 2 weeks then Prednisone 2.5 mg daily for 2 weeks  Use Advair 250 50 daily Albuterol as needed Avoid allergens

## 2017-05-14 DIAGNOSIS — J301 Allergic rhinitis due to pollen: Secondary | ICD-10-CM | POA: Diagnosis not present

## 2017-05-15 DIAGNOSIS — E119 Type 2 diabetes mellitus without complications: Secondary | ICD-10-CM | POA: Diagnosis not present

## 2017-05-15 LAB — HM DIABETES EYE EXAM

## 2017-05-16 ENCOUNTER — Ambulatory Visit (INDEPENDENT_AMBULATORY_CARE_PROVIDER_SITE_OTHER): Payer: Medicare Other

## 2017-05-16 DIAGNOSIS — J4540 Moderate persistent asthma, uncomplicated: Secondary | ICD-10-CM | POA: Diagnosis not present

## 2017-05-21 DIAGNOSIS — J301 Allergic rhinitis due to pollen: Secondary | ICD-10-CM | POA: Diagnosis not present

## 2017-05-21 MED ORDER — OMALIZUMAB 150 MG ~~LOC~~ SOLR
375.0000 mg | Freq: Once | SUBCUTANEOUS | Status: AC
  Administered 2017-05-16: 375 mg via SUBCUTANEOUS

## 2017-05-21 NOTE — Progress Notes (Signed)
Mrs. Rachael Silva came in on 05/16/17 to receive a Xolair injection. The patient is given 375mg  every 14 days. Due to each vial equalling 150mg , 15units of medication was wasted.

## 2017-05-28 DIAGNOSIS — J301 Allergic rhinitis due to pollen: Secondary | ICD-10-CM | POA: Diagnosis not present

## 2017-05-29 ENCOUNTER — Ambulatory Visit (INDEPENDENT_AMBULATORY_CARE_PROVIDER_SITE_OTHER): Payer: Medicare Other

## 2017-05-29 DIAGNOSIS — J4540 Moderate persistent asthma, uncomplicated: Secondary | ICD-10-CM | POA: Diagnosis not present

## 2017-05-30 MED ORDER — OMALIZUMAB 150 MG ~~LOC~~ SOLR
375.0000 mg | Freq: Once | SUBCUTANEOUS | Status: AC
  Administered 2017-05-29: 375 mg via SUBCUTANEOUS

## 2017-05-30 NOTE — Progress Notes (Signed)
Rachael Silva came in on 8.21.18 to receive a Xolair injection. The patient is given 375mg  every 141 days. Due to each vial equalling 150mg , 15mg  of medication was wasted.

## 2017-05-31 ENCOUNTER — Ambulatory Visit: Payer: Medicare Other

## 2017-06-02 ENCOUNTER — Other Ambulatory Visit: Payer: Self-pay | Admitting: Family Medicine

## 2017-06-07 ENCOUNTER — Other Ambulatory Visit: Payer: Self-pay | Admitting: Family Medicine

## 2017-06-07 DIAGNOSIS — J301 Allergic rhinitis due to pollen: Secondary | ICD-10-CM | POA: Diagnosis not present

## 2017-06-10 ENCOUNTER — Other Ambulatory Visit: Payer: Self-pay | Admitting: Family Medicine

## 2017-06-12 ENCOUNTER — Telehealth: Payer: Self-pay | Admitting: Internal Medicine

## 2017-06-12 NOTE — Telephone Encounter (Signed)
#   vials:6 Ordered date: 06/12/17 Shipping Date:06/12/17

## 2017-06-12 NOTE — Telephone Encounter (Signed)
Hasn't had TSH checked in over a year and no future appts., please advise

## 2017-06-12 NOTE — Telephone Encounter (Signed)
Please schedule tsh when able  Refill times one  Thanks

## 2017-06-13 ENCOUNTER — Ambulatory Visit (INDEPENDENT_AMBULATORY_CARE_PROVIDER_SITE_OTHER): Payer: Medicare Other

## 2017-06-13 DIAGNOSIS — J4540 Moderate persistent asthma, uncomplicated: Secondary | ICD-10-CM

## 2017-06-13 NOTE — Telephone Encounter (Signed)
#   Vials:6 Arrival Date:06/13/17 Lot #[id number] Exp Date:11/2020

## 2017-06-14 MED ORDER — OMALIZUMAB 150 MG ~~LOC~~ SOLR
375.0000 mg | SUBCUTANEOUS | Status: DC
  Administered 2017-06-13: 375 mg via SUBCUTANEOUS

## 2017-06-14 NOTE — Progress Notes (Signed)
Xolair injection documentation and charges entered by Dorethea ClanAshley Geraldy Akridge, RMA, based on injection sheet filled out by Rachael Silva per office protocol.   patient came in on 06/13/2017 to receive a Xolair injection. The patient is given 375mg  every 14 days. Due to each vial equalling 150mg , 75mg  of medication was wasted.

## 2017-06-14 NOTE — Telephone Encounter (Signed)
appt scheduled and med filled  

## 2017-06-17 ENCOUNTER — Other Ambulatory Visit: Payer: Self-pay | Admitting: Family Medicine

## 2017-06-18 DIAGNOSIS — J301 Allergic rhinitis due to pollen: Secondary | ICD-10-CM | POA: Diagnosis not present

## 2017-06-19 ENCOUNTER — Other Ambulatory Visit (INDEPENDENT_AMBULATORY_CARE_PROVIDER_SITE_OTHER): Payer: Medicare Other

## 2017-06-19 ENCOUNTER — Ambulatory Visit (INDEPENDENT_AMBULATORY_CARE_PROVIDER_SITE_OTHER): Payer: Medicare Other

## 2017-06-19 DIAGNOSIS — T380X5A Adverse effect of glucocorticoids and synthetic analogues, initial encounter: Secondary | ICD-10-CM

## 2017-06-19 DIAGNOSIS — Z23 Encounter for immunization: Secondary | ICD-10-CM | POA: Diagnosis not present

## 2017-06-19 DIAGNOSIS — E785 Hyperlipidemia, unspecified: Secondary | ICD-10-CM | POA: Diagnosis not present

## 2017-06-19 DIAGNOSIS — E099 Drug or chemical induced diabetes mellitus without complications: Secondary | ICD-10-CM

## 2017-06-19 LAB — LIPID PANEL
CHOL/HDL RATIO: 3
Cholesterol: 201 mg/dL — ABNORMAL HIGH (ref 0–200)
HDL: 64.4 mg/dL (ref >39.00)
LDL CALC: 120 mg/dL — AB (ref 0–99)
NONHDL: 136.41
TRIGLYCERIDES: 84 mg/dL (ref 0.0–149.0)
VLDL: 16.8 mg/dL (ref 0.0–40.0)

## 2017-06-19 LAB — HEMOGLOBIN A1C: Hgb A1c MFr Bld: 6.4 % (ref 4.6–6.5)

## 2017-06-27 ENCOUNTER — Ambulatory Visit: Payer: Medicare Other

## 2017-06-28 ENCOUNTER — Telehealth: Payer: Self-pay | Admitting: Family Medicine

## 2017-06-28 MED ORDER — ATORVASTATIN CALCIUM 20 MG PO TABS: 20.0000 mg | ORAL_TABLET | Freq: Every day | ORAL | 11 refills | Status: DC

## 2017-06-28 NOTE — Telephone Encounter (Signed)
-----   Message from Desmond Dike, [place] sent at 06/28/2017 12:34 PM EDT ----- Spoke to pt who states she is agreeable to starting a statin. [facility]

## 2017-06-28 NOTE — Telephone Encounter (Signed)
I sent it in  Take 1 pill daily in evening with low fat snack if possible   Re check lipid ast/alt in 6 weeks   Alert me if any side effects like muscle pain

## 2017-06-28 NOTE — Telephone Encounter (Signed)
Pt notified Rx sent. Pt notified of Dr. Royden Purl comments and instructions and f/u lab appt scheduled

## 2017-07-02 ENCOUNTER — Telehealth: Payer: Self-pay | Admitting: *Deleted

## 2017-07-02 DIAGNOSIS — J301 Allergic rhinitis due to pollen: Secondary | ICD-10-CM | POA: Diagnosis not present

## 2017-07-02 NOTE — Telephone Encounter (Signed)
Pt calling in regards to rx refill request for Prednisone. Per patient she was told by Dr. Belia Heman to wean off but if sx came back she could take 1/2 tab as needed. Pt stated for the past 10 days cough has resurfaced day and night. She did take the flu shot and not sure if this has something to do with it. She had a few Prednisone left and has taken them but only has 2 more pills left. Please advise.

## 2017-07-03 ENCOUNTER — Other Ambulatory Visit: Payer: Self-pay | Admitting: Family Medicine

## 2017-07-03 ENCOUNTER — Ambulatory Visit (INDEPENDENT_AMBULATORY_CARE_PROVIDER_SITE_OTHER): Payer: Medicare Other

## 2017-07-03 DIAGNOSIS — J4540 Moderate persistent asthma, uncomplicated: Secondary | ICD-10-CM | POA: Diagnosis not present

## 2017-07-03 MED ORDER — PREDNISONE 5 MG PO TABS: 2.5000 mg | ORAL_TABLET | Freq: Every day | ORAL | 0 refills | Status: DC

## 2017-07-03 NOTE — Telephone Encounter (Signed)
Done

## 2017-07-03 NOTE — Telephone Encounter (Signed)
Please refill previous prednisone dose Thank you

## 2017-07-04 NOTE — Telephone Encounter (Signed)
Not on med list, last refilled on 02/23/16 #30 tabs with 5 additional refills, please advise

## 2017-07-04 NOTE — Telephone Encounter (Signed)
Will refill electronically  

## 2017-07-05 MED ORDER — OMALIZUMAB 150 MG ~~LOC~~ SOLR
375.0000 mg | SUBCUTANEOUS | Status: DC
  Administered 2017-07-03: 375 mg via SUBCUTANEOUS

## 2017-07-05 NOTE — Progress Notes (Signed)
Xolair injection documentation and charges entered by Avaree Gilberti, RMA, based on injection sheet filled out by Tammy Scott per office protocol.   Patient came in on 07/03/2017 to receive a Xolair injection. The patient is given 375mg every 14 days. Due to each vial equalling 150mg, 75mg of medication was wasted.  

## 2017-07-09 ENCOUNTER — Telehealth: Payer: Self-pay | Admitting: *Deleted

## 2017-07-09 DIAGNOSIS — J301 Allergic rhinitis due to pollen: Secondary | ICD-10-CM | POA: Diagnosis not present

## 2017-07-09 NOTE — Telephone Encounter (Signed)
#   vials:6 Ordered date:07/09/2017 Shipping Date:07/09/2017

## 2017-07-10 NOTE — Telephone Encounter (Signed)
#   Vials:6 Arrival Date:07/10/17 Lot #[id number] Exp Date:12/2020

## 2017-07-13 DIAGNOSIS — J301 Allergic rhinitis due to pollen: Secondary | ICD-10-CM | POA: Diagnosis not present

## 2017-07-16 DIAGNOSIS — J301 Allergic rhinitis due to pollen: Secondary | ICD-10-CM | POA: Diagnosis not present

## 2017-07-17 ENCOUNTER — Ambulatory Visit (INDEPENDENT_AMBULATORY_CARE_PROVIDER_SITE_OTHER): Payer: Medicare Other

## 2017-07-17 DIAGNOSIS — J4540 Moderate persistent asthma, uncomplicated: Secondary | ICD-10-CM

## 2017-07-17 DIAGNOSIS — H903 Sensorineural hearing loss, bilateral: Secondary | ICD-10-CM | POA: Diagnosis not present

## 2017-07-17 MED ORDER — OMALIZUMAB 150 MG ~~LOC~~ SOLR
375.0000 mg | Freq: Once | SUBCUTANEOUS | Status: AC
  Administered 2017-07-17: 375 mg via SUBCUTANEOUS

## 2017-07-17 NOTE — Progress Notes (Signed)
Rachael Silva came in on 07/17/17 to receive a Xolair injection. The patient is given  every 14 days. Due to each vial equalling , 15 units of medication was wasted.

## 2017-07-22 ENCOUNTER — Other Ambulatory Visit: Payer: Self-pay | Admitting: Family Medicine

## 2017-07-24 ENCOUNTER — Encounter: Payer: Self-pay | Admitting: Internal Medicine

## 2017-07-24 ENCOUNTER — Ambulatory Visit (INDEPENDENT_AMBULATORY_CARE_PROVIDER_SITE_OTHER): Payer: Medicare Other | Admitting: Internal Medicine

## 2017-07-24 VITALS — BP 142/88 | HR 96 | Ht 67.0 in | Wt 283.0 lb

## 2017-07-24 DIAGNOSIS — J4520 Mild intermittent asthma, uncomplicated: Secondary | ICD-10-CM | POA: Diagnosis not present

## 2017-07-24 DIAGNOSIS — G4733 Obstructive sleep apnea (adult) (pediatric): Secondary | ICD-10-CM | POA: Diagnosis not present

## 2017-07-24 NOTE — Progress Notes (Signed)
MRN# [id number] Rachael Silva 1945/11/21   CC: Chief Complaint  Patient presents with  . Follow-up    f/u OSA & breathing: breathing doing better doing well OSA.   synopsis: 71 year old female past medical history of asthma. Being followed by pulmonary for chronic eosinophilic pneumonia, status post bronchoscopy in December 2016 with negative cultures for AFB and fungus. Transbronchial biopsy-positive for eosinophilic infiltrate. Treated with steroids unable to wean since  October 2015.  CC follow up ASTHMA HPI Patient presents today for follow-up visit of her ABPA, asthma, chronic prednisone use secondary to lung disease. Patient states today that she is doing well, she is currently on prednisone 10 mg daily, and receiving allergy shots once weekly. Since her last visit she's not had any further exacerbations of eosinophilic pneumonia or ABPA. She has been started on Xolair and she feels great No acute signs of infection at this time No wheezing no shortness of breath No dyspnea on exertion Her asthma seems to be under control at this time However, after further discussion, patient is constantly exposed to cat dander and thick carpet I have advised that she is always being exposed to triggers.  Review of Systems: Gen:  Denies  fever, sweats, chills HEENT: Denies blurred vision, double vision, ear pain, eye pain, hearing loss, nose bleeds, sore throat Cvc:  No dizziness, chest pain or heaviness Resp:   No wheezes no shortness of breath Gi: Denies swallowing difficulty, stomach pain, nausea or vomiting, diarrhea, constipation, bowel incontinence Gu:  Denies bladder incontinence, burning urine Ext:   No Joint pain, stiffness or swelling Skin: No skin rash, easy bruising or bleeding or hives Endoc:  No polyuria, polydipsia , polyphagia or weight change Other:  All other systems negative  Allergies:  Erythromycin  Physical Examination:  VS: BP (!) 142/88 (BP Location: Left Arm, Cuff  Size: Normal)   Pulse 96   Ht  (1.702 m)   Wt 283 lb (128.4 kg)   SpO2 98%   BMI 44.32 kg/m   General Appearance: No distress  HEENT: PERRLA, no ptosis, no other lesions noticed. No bruits Pulmonary:normal breath sounds., diaphragmatic excursion normal.No wheezing, No rales   Cardiovascular:  Normal S1,S2.  No m/r/g.     Abdomen:Exam: Benign, Soft, non-tender, No masses  Skin:   warm, no rashes, no ecchymosis  Extremities: normal, no cyanosis, clubbing, warm with normal capillary refill.     LABS Results for SYRINA, WAKE (MRN [id number]) as of 08/01/2016 11:16  Ref. Range 07/02/2014 13:00 08/05/2015 15:12 03/30/2016 15:11  IgE (Immunoglobulin E), Serum Latest Ref Range: 0 - 100 IU/mL 2,401 (H) 1,253 (H) 1,180 (H)   Results for JIZELLE, CONKEY (MRN [id number]) as of 08/01/2016 11:16  Ref. Range 08/05/2015 15:12 09/16/2015 12:46 03/30/2016 15:11 05/03/2016 08:42 07/26/2016 15:31  Eosinophil Latest Units: % 5.4 (H) 3        Assessment and Plan:   71 year old female with chronic cough, recurrent URIs, being treated as ABPA/chronic eosinophilic pneumonia, now  Xolair therapy and chronic ASTHMA  Asthma, chronic/chronic eosinophilic pneumonia -Patient has mild and persistent chronic asthma which seems to be well-controlled right now at this time with Xolair therapy I have advised patient to start to wean steroids and to assess respiratory status Eosinophil % =3 IgE=1180   Plan -Continue Xolair as prescribed Prednisone taper over the next 4 weeks advised to patient and to reassess respiratory symptoms -The plan for prednisone therapy is to decrease to 5 mg daily for 4 weeks  and then 2.5 mg daily for 4 weeks and then off  Avoid any allergens-she is constantly around cats and dogs along with carpet I have advised patient that she needs to make choices with her environmental exposures-remove cats and remove carpets It seems that when she cleans her moms house that has  heavy carpet, she has reactive airways disease from all dust Continue Advair daily Continue allergy regiment and allergy shot Avoid sick contacts.    ABPA (allergic bronchopulmonary aspergillosis) Patient educated on side effects of steroids (weight gain, wereretention, increased appetite, muscle weakness, bone degradation, dysfunctional calcium levels, elevated blood glucose levels, anxiety/agitation at night, decreased immune).  Will continue to taper steroid doses to the lowest possible dose that will provide symptomatic relief-plan is to wean off in the next 8 weeks,  Patient educated on the current diagnosis, she understands that this is not an active infection, it is most likely a hyperreactive reaction to antigen exposure.  She does not need/require antifungal treatment at this time. This is her initial treatment course, if she has refractory or recurrent symptoms will consider adding antifungals at that time.  Bronchoscopy 09/2015: All cultures including fungal, bacterial, AFB a currently negative to date. Transbronchial biopsy showed eosinophilic infiltration consistent with eosinophilic pneumonia in 2016 Bronhoscopy with BAL 03/2016: negative cultures.      OSA on CPAP Cont with CPAP nightly CPAP 11cm H20 Compliance report shows 100% at 30 days as well as 100% for more than 4 hours a day Her AHI is 1.8 she is on auto CPAP pressure 11-15 7 m of water pressure   Patient satisfied with Plan of action and management. 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  [facility]  Medical Director [facility] Medical Director [facility] Cardio-Pulmonary Department

## 2017-07-24 NOTE — Patient Instructions (Addendum)
Continue CPAP as prescribed Asthma mild persistent continue Xolair therapy along with inhaler therapy History of ABPA and tinea prednisone 10 mg daily

## 2017-07-26 DIAGNOSIS — J45991 Cough variant asthma: Secondary | ICD-10-CM | POA: Diagnosis not present

## 2017-07-26 DIAGNOSIS — J339 Nasal polyp, unspecified: Secondary | ICD-10-CM | POA: Diagnosis not present

## 2017-07-26 DIAGNOSIS — J301 Allergic rhinitis due to pollen: Secondary | ICD-10-CM | POA: Diagnosis not present

## 2017-07-30 DIAGNOSIS — J301 Allergic rhinitis due to pollen: Secondary | ICD-10-CM | POA: Diagnosis not present

## 2017-08-02 ENCOUNTER — Ambulatory Visit (INDEPENDENT_AMBULATORY_CARE_PROVIDER_SITE_OTHER): Payer: Medicare Other

## 2017-08-02 DIAGNOSIS — J4540 Moderate persistent asthma, uncomplicated: Secondary | ICD-10-CM | POA: Diagnosis not present

## 2017-08-03 ENCOUNTER — Ambulatory Visit: Payer: Medicare Other

## 2017-08-07 MED ORDER — OMALIZUMAB 150 MG ~~LOC~~ SOLR
375.0000 mg | Freq: Once | SUBCUTANEOUS | Status: AC
  Administered 2017-08-02: 375 mg via SUBCUTANEOUS

## 2017-08-07 NOTE — Progress Notes (Signed)
Mrs. Rachael Silva came in on 10.25.18 to receive a Xolair injection. The patient is given 375mg  every 14 days. Due to each vial equalling 150mg , 15mg  of medication was wasted.

## 2017-08-13 ENCOUNTER — Telehealth: Payer: Self-pay | Admitting: Internal Medicine

## 2017-08-13 NOTE — Telephone Encounter (Signed)
#   vials:6 Ordered date:08/13/17 Shipping Date:08/13/17

## 2017-08-14 ENCOUNTER — Telehealth: Payer: Self-pay

## 2017-08-14 ENCOUNTER — Other Ambulatory Visit (INDEPENDENT_AMBULATORY_CARE_PROVIDER_SITE_OTHER): Payer: Medicare Other

## 2017-08-14 DIAGNOSIS — E785 Hyperlipidemia, unspecified: Secondary | ICD-10-CM | POA: Diagnosis not present

## 2017-08-14 LAB — ALT: ALT: 13 U/L (ref 0–35)

## 2017-08-14 LAB — LIPID PANEL
CHOLESTEROL: 148 mg/dL (ref 0–200)
HDL: 64.3 mg/dL (ref >39.00)
LDL CALC: 75 mg/dL (ref 0–99)
NonHDL: 83.87
TRIGLYCERIDES: 42 mg/dL (ref 0.0–149.0)
Total CHOL/HDL Ratio: 2
VLDL: 8.4 mg/dL (ref 0.0–40.0)

## 2017-08-14 LAB — AST: AST: 14 U/L (ref 0–37)

## 2017-08-14 NOTE — Telephone Encounter (Signed)
Pt notified of Dr. Royden Purlower's comments and instructions and med removed from med list

## 2017-08-14 NOTE — Telephone Encounter (Signed)
Pt left note at front desk after having lab draw today; atorvastatin is causing muscle pains. If pt still needs med for cholesterol after lab results returned pt request different med that is not a statin. [facility] [place]

## 2017-08-14 NOTE — Telephone Encounter (Signed)
There is not a lot out there besides statins  There is a class of drug (new) given by cardiologists only for severely high cholesterol   Stop the atorvastatin since it is causing muscle pain (and remove from list)   Avoid red meat/ fried foods/ egg yolks/ fatty breakfast meats/ butter, cheese and high fat dairy/ and shellfish   Do the best you can

## 2017-08-14 NOTE — Telephone Encounter (Signed)
#   Vials:6 Arrival Date:08/14/17 Lot #[id number] Exp Date:12/2020

## 2017-08-15 DIAGNOSIS — J301 Allergic rhinitis due to pollen: Secondary | ICD-10-CM | POA: Diagnosis not present

## 2017-08-16 ENCOUNTER — Ambulatory Visit (INDEPENDENT_AMBULATORY_CARE_PROVIDER_SITE_OTHER): Payer: Medicare Other

## 2017-08-16 DIAGNOSIS — J4540 Moderate persistent asthma, uncomplicated: Secondary | ICD-10-CM | POA: Diagnosis not present

## 2017-08-17 MED ORDER — OMALIZUMAB 150 MG ~~LOC~~ SOLR
375.0000 mg | SUBCUTANEOUS | Status: DC
  Administered 2017-08-16: 375 mg via SUBCUTANEOUS

## 2017-08-17 NOTE — Progress Notes (Signed)
Rachael DanaDejuanna Schirmer came in on 08/16/2017 to receive a Xolair injection. The patient is given 375mg  every 14 days. Due to each vial equalling 150mg , 75mg  of medication was wasted.   Documentation of medication administration and charges of Xolair have been completed by Jaynee EaglesLindsay Nikola Marone, CMA based on the Xolair documentation sheet completed by [facility].

## 2017-08-22 ENCOUNTER — Other Ambulatory Visit: Payer: Self-pay | Admitting: Family Medicine

## 2017-08-22 NOTE — Telephone Encounter (Signed)
No recent f/u or CPE and no TSH lab in over a year. Please advise

## 2017-08-22 NOTE — Telephone Encounter (Signed)
Please schedule winter f/u and refill until then Thanks  

## 2017-08-23 DIAGNOSIS — J301 Allergic rhinitis due to pollen: Secondary | ICD-10-CM | POA: Diagnosis not present

## 2017-08-23 NOTE — Telephone Encounter (Signed)
Called pt and no answer and no voicemail (kept ringing) so CRM created

## 2017-08-28 ENCOUNTER — Telehealth: Payer: Self-pay | Admitting: Family Medicine

## 2017-08-28 NOTE — Telephone Encounter (Signed)
Copied from CRM [phone]#[id number]. Topic: Quick Communication - See Telephone Encounter >> Aug 28, 2017 11:31 AM Landry MellowFoltz, Melissa J wrote: CRM for notification. See Telephone encounter for:   08/28/17.pt is calling about refill for levothyroxine (SYNTHROID, LEVOTHROID) 50 MCG tablet, the pharmacy has alrady called and gotten no response. [facility] [place]. Also, pt would like to know what a "step down" substitute is for atorvostatin  Cb number for pt is [phone]

## 2017-08-28 NOTE — Telephone Encounter (Signed)
Last TSH 04/2016, and no upcoming appt scheduled. Pt also has additional questions about lipid med. pls advise

## 2017-08-28 NOTE — Telephone Encounter (Signed)
Please let her know I am out of the office on surgical leave  Refill thyroid medicine for 3 months  F/u in 3 mo for visit-we can discuss cholesterol further and also check her TSH

## 2017-08-28 NOTE — Telephone Encounter (Signed)
Left message for pt. To call and schedule an appointment to be seen.

## 2017-08-29 DIAGNOSIS — J301 Allergic rhinitis due to pollen: Secondary | ICD-10-CM | POA: Diagnosis not present

## 2017-08-29 NOTE — Telephone Encounter (Signed)
appt scheduled and med refilled 

## 2017-09-03 DIAGNOSIS — J301 Allergic rhinitis due to pollen: Secondary | ICD-10-CM | POA: Diagnosis not present

## 2017-09-06 ENCOUNTER — Ambulatory Visit (INDEPENDENT_AMBULATORY_CARE_PROVIDER_SITE_OTHER): Payer: Medicare Other

## 2017-09-06 DIAGNOSIS — J4540 Moderate persistent asthma, uncomplicated: Secondary | ICD-10-CM | POA: Diagnosis not present

## 2017-09-10 DIAGNOSIS — J301 Allergic rhinitis due to pollen: Secondary | ICD-10-CM | POA: Diagnosis not present

## 2017-09-10 MED ORDER — OMALIZUMAB 150 MG ~~LOC~~ SOLR
375.0000 mg | Freq: Once | SUBCUTANEOUS | Status: AC
Start: 2017-09-06 — End: 2017-09-06
  Administered 2017-09-06: 375 mg via SUBCUTANEOUS

## 2017-09-10 NOTE — Progress Notes (Signed)
Mrs. Rachael Silva came in on 09/06/17 to receive a Xolair injection. The patient is given 375mg  every 14 days. Due to each vial equalling 150mg , 15mg  of medication was wasted.

## 2017-09-14 ENCOUNTER — Telehealth: Payer: Self-pay | Admitting: Internal Medicine

## 2017-09-14 NOTE — Telephone Encounter (Signed)
#   vials:6 Ordered date:09/14/17 Shipping Date:09/17/17

## 2017-09-19 NOTE — Telephone Encounter (Signed)
#   Vials:6 Arrival Date:09/19/17 Lot #[id number] Exp Date:12/2020

## 2017-09-20 ENCOUNTER — Ambulatory Visit: Payer: Medicare Other

## 2017-09-20 ENCOUNTER — Other Ambulatory Visit: Payer: Self-pay | Admitting: Family Medicine

## 2017-09-24 ENCOUNTER — Other Ambulatory Visit: Payer: Self-pay | Admitting: Family Medicine

## 2017-09-25 ENCOUNTER — Ambulatory Visit (INDEPENDENT_AMBULATORY_CARE_PROVIDER_SITE_OTHER): Payer: Medicare Other

## 2017-09-25 DIAGNOSIS — J4540 Moderate persistent asthma, uncomplicated: Secondary | ICD-10-CM

## 2017-09-26 ENCOUNTER — Telehealth: Payer: Self-pay | Admitting: Family Medicine

## 2017-09-26 DIAGNOSIS — E099 Drug or chemical induced diabetes mellitus without complications: Secondary | ICD-10-CM

## 2017-09-26 DIAGNOSIS — T380X5A Adverse effect of glucocorticoids and synthetic analogues, initial encounter: Principal | ICD-10-CM

## 2017-09-26 DIAGNOSIS — E785 Hyperlipidemia, unspecified: Secondary | ICD-10-CM

## 2017-09-26 DIAGNOSIS — E039 Hypothyroidism, unspecified: Secondary | ICD-10-CM

## 2017-09-26 DIAGNOSIS — J301 Allergic rhinitis due to pollen: Secondary | ICD-10-CM | POA: Diagnosis not present

## 2017-09-26 DIAGNOSIS — R635 Abnormal weight gain: Secondary | ICD-10-CM

## 2017-09-26 DIAGNOSIS — T50905A Adverse effect of unspecified drugs, medicaments and biological substances, initial encounter: Secondary | ICD-10-CM

## 2017-09-26 DIAGNOSIS — R609 Edema, unspecified: Secondary | ICD-10-CM

## 2017-09-26 NOTE — Telephone Encounter (Signed)
Best number [phone]  Pt came in today to schedule her medicare wellness with lisa 12/27 and cpx with you 1/23  Pt would like to be referred to a nutritionist

## 2017-09-27 NOTE — Telephone Encounter (Signed)
Left VM requesting pt to call the office back CRM created 

## 2017-09-27 NOTE — Telephone Encounter (Signed)
I would generally refer to DM teaching and that should be covered by ins if she has not done it before  If she wants a nutritionist consult instead that may not be covered  Is she ok with DM teaching? Does she pref [facility] or [facility] ? Thanks

## 2017-10-01 NOTE — Telephone Encounter (Signed)
Pt already has had DM teaching so she does want a referral to a nutritionist, she will check with her insurance to see if it's covered but she wants us to go ahead and put the referral in, [facility] within the next few days

## 2017-10-02 NOTE — Telephone Encounter (Signed)
Ref is done  [facility]

## 2017-10-02 NOTE — Telephone Encounter (Signed)
-----   Message from Robert Bellowarlesia R Pinson, LPN sent at 09/81/191412/24/2018  7:39 PM EST ----- Regarding: Labs 12/27 Lab orders needed. Thank you.  Insurance:  Medicare - traditional

## 2017-10-02 NOTE — Addendum Note (Signed)
Addended by: Roxy MannsWER, Sudie Bandel A on: 10/02/2017 03:36 PM   Modules accepted: Orders

## 2017-10-03 MED ORDER — OMALIZUMAB 150 MG ~~LOC~~ SOLR
375.0000 mg | SUBCUTANEOUS | Status: DC
  Administered 2017-09-25: 375 mg via SUBCUTANEOUS

## 2017-10-03 NOTE — Progress Notes (Signed)
Xolair injection documentation and charges entered by Ashley Caulfield, RMA, based on injection sheet filled out by Lindsay Lemons, CMA, per office protocol.    patient came in on 09/25/17 to receive a Xolair injection. The patient is given 375mg every 14 days. Due to each vial equalling 150mg, 75mg of medication was wasted.  

## 2017-10-04 ENCOUNTER — Ambulatory Visit (INDEPENDENT_AMBULATORY_CARE_PROVIDER_SITE_OTHER): Payer: Medicare Other

## 2017-10-04 DIAGNOSIS — J301 Allergic rhinitis due to pollen: Secondary | ICD-10-CM | POA: Diagnosis not present

## 2017-10-04 DIAGNOSIS — Z789 Other specified health status: Secondary | ICD-10-CM

## 2017-10-04 NOTE — Telephone Encounter (Signed)
Called patient [facility] and wants to go there for Nutrition. Referral will be sent to them and patient is aware that they will call her to set up.

## 2017-10-04 NOTE — Progress Notes (Signed)
No charge for AWV. Rescheduled until January 2019.

## 2017-10-04 NOTE — Progress Notes (Signed)
Pre visit review using our clinic review tool, if applicable. No additional management support is needed unless otherwise documented below in the visit note. 

## 2017-10-05 DIAGNOSIS — J301 Allergic rhinitis due to pollen: Secondary | ICD-10-CM | POA: Diagnosis not present

## 2017-10-10 DIAGNOSIS — J301 Allergic rhinitis due to pollen: Secondary | ICD-10-CM | POA: Diagnosis not present

## 2017-10-11 ENCOUNTER — Ambulatory Visit (INDEPENDENT_AMBULATORY_CARE_PROVIDER_SITE_OTHER): Payer: Medicare Other

## 2017-10-11 DIAGNOSIS — J4540 Moderate persistent asthma, uncomplicated: Secondary | ICD-10-CM

## 2017-10-15 DIAGNOSIS — J301 Allergic rhinitis due to pollen: Secondary | ICD-10-CM | POA: Diagnosis not present

## 2017-10-16 MED ORDER — OMALIZUMAB 150 MG ~~LOC~~ SOLR
375.0000 mg | SUBCUTANEOUS | Status: DC
  Administered 2017-10-11: 375 mg via SUBCUTANEOUS

## 2017-10-16 NOTE — Progress Notes (Signed)
Rachael DanaDejuanna Coil came in on 10/11/2017 to receive a Xolair injection. The patient is given 375mg  every 14 days. Due to each vial equalling 150mg , 75mg  of medication was wasted.   Documentation of medication administration and charges of Xolair have been completed by Jaynee EaglesLindsay Lemons, CMA based on the Xolair documentation sheet [facility].

## 2017-10-18 ENCOUNTER — Telehealth: Payer: Self-pay | Admitting: Internal Medicine

## 2017-10-18 NOTE — Telephone Encounter (Signed)
#   PFS:4) 150mg  syringes & 2) 75mg  Syringes Ordered date:10/18/17 Shipping Date:10/18/17

## 2017-10-19 ENCOUNTER — Encounter: Payer: Self-pay | Admitting: Nurse Practitioner

## 2017-10-19 ENCOUNTER — Ambulatory Visit (INDEPENDENT_AMBULATORY_CARE_PROVIDER_SITE_OTHER): Payer: Medicare Other | Admitting: Nurse Practitioner

## 2017-10-19 VITALS — BP 136/74 | HR 81 | Temp 98.4°F | Ht 67.0 in | Wt 275.0 lb

## 2017-10-19 DIAGNOSIS — L509 Urticaria, unspecified: Secondary | ICD-10-CM

## 2017-10-19 MED ORDER — RANITIDINE HCL 300 MG PO TABS: 300.0000 mg | ORAL_TABLET | Freq: Every day | ORAL | 0 refills | Status: DC

## 2017-10-19 MED ORDER — METHYLPREDNISOLONE ACETATE 80 MG/ML IJ SUSP
80.0000 mg | Freq: Once | INTRAMUSCULAR | Status: AC
  Administered 2017-10-19: 80 mg via INTRAMUSCULAR

## 2017-10-19 MED ORDER — TRIAMCINOLONE ACETONIDE 0.1 % EX OINT: 1.0000 "application " | TOPICAL_OINTMENT | Freq: Two times a day (BID) | CUTANEOUS | 0 refills | Status: DC

## 2017-10-19 MED ORDER — DIPHENHYDRAMINE HCL 25 MG PO TABS: ORAL_TABLET | ORAL | 0 refills | Status: DC

## 2017-10-19 MED ORDER — METHYLPREDNISOLONE ACETATE 80 MG/ML IJ SUSP: 80.0000 mg | Freq: Once | INTRAMUSCULAR | Status: DC

## 2017-10-19 NOTE — Telephone Encounter (Signed)
#   PFS:4)150mg  & 2)75mg  Arrival Date:10/19/17 Lot #:[id number] ([phone] [id number] (2) Exp Date:09/2018

## 2017-10-19 NOTE — Addendum Note (Signed)
Addended by: Milas KocherMORRIS, Elizabella Nolet A on: 10/19/2017 04:24 PM   Modules accepted: Orders

## 2017-10-19 NOTE — Patient Instructions (Addendum)
Please inform pulmonology about rash.  Anaphylactic Reaction An anaphylactic reaction (anaphylaxis) is a sudden allergic reaction that is very bad (severe). It also affects more than one part of the body. This condition can be life-threatening. If you have an anaphylactic reaction, you need to get medical help right away. You may need to stay in the hospital. Your doctor may teach you how to use an allergy kit (anaphylaxis kit) and how to give yourself an allergy shot (epinephrine injection). You can give yourself an allergy shot with what is commonly called an auto-injector "pen." Symptoms of an anaphylactic reaction may include:  A stuffy nose (nasal congestion).  Headache.  Tingling in your mouth.  A flushed face.  An itchy, red rash.  Swelling of your eyes, lips, face, or tongue.  Swelling of the back of your mouth and your throat.  Breathing loudly (wheezing).  A hoarse voice.  Itchy, red, swollen areas of skin (hives).  Dizziness or light-headedness.  Passing out (fainting).  Feeling worried or nervous (anxiety).  Feeling confused.  Pain in your belly (abdomen) or chest.  Trouble with breathing, talking, or swallowing.  A tight feeling in your chest or throat.  Fast or uneven heartbeats (palpitations).  Throwing up (vomiting).  Watery poop (diarrhea).  Follow these instructions at home: Safety  Always keep an auto-injector pen or your allergy kit with you. These could save your life. Use them as told by your doctor.  Do not drive until your doctor says that it is safe.  Make sure that you, the people who live with you, and your employer know: ? How to use your allergy kit. ? How to use an auto-injector pen to give you an allergy shot.  If you used your auto-injector pen: ? Get more medicine for it right away. This is important in case you have another reaction. ? Get help right away.  Wear a bracelet or necklace that says you have an allergy, if  your doctor tells you to do this.  Learn the signs of a very bad allergic reaction.  Work with your doctors to make a plan for what to do if you have a very bad allergic reaction. Being prepared is important. General instructions  Take over-the-counter and prescription medicines only as told by your doctor.  If you have itchy, red, swollen areas of skin or a rash: ? Use over-the-counter medicine (antihistamine) as told by your doctor. ? Put cold, wet cloths (cold compresses) on your skin. ? Take baths or showers in cool water. Avoid hot water.  If you had tests done, it is up to you to get your test results. Ask your doctor when your results will be ready.  Tell any doctors who care for you that you have an allergy.  Keep all follow-up visits as told by your doctor. This is important. How is this prevented?  Avoid things (allergens) that gave you a very bad allergic reaction before.  If you have a food allergy and you go to a restaurant, tell your server about your allergy. If you are not sure if your meal was made with food that you are allergic to, ask your server before you eat it. Contact a doctor if:  You have symptoms of an allergic reaction. You may notice them soon after being around whatever it is that you are allergic to. Symptoms may include: ? A rash. ? A headache. ? Sneezing or a runny nose. ? Swelling. ? Feeling sick to your stomach. ?  Watery poop. Get help right away if:  You had to use your auto-injector pen. You must go to the emergency room even if the medicine seems to be working.  You have any of these: ? A tight feeling in your chest or your throat. ? Loud breathing. ? Trouble with breathing. ? Itchy, red, swollen areas of skin. ? Red skin or itching all over your body. ? Swelling in your lips, tongue, or the back of your throat.  You have throwing up that gets very bad.  You have watery poop that gets very bad.  You pass out or feel like you  might pass out. These symptoms may be an emergency. Do not wait to see if the symptoms will go away. Use your auto-injector pen or allergy kit as you have been told. Get medical help right away. Call your local emergency services (911 in the [place] Do not drive yourself to the hospital. Summary  An anaphylactic reaction (anaphylaxis) is a sudden allergic reaction that is very bad (severe).  This condition can be life-threatening. If you have an anaphylactic reaction, you need to get medical help right away.  Your doctor may teach you how to use an allergy kit (anaphylaxis kit) and how to give yourself an allergy shot (epinephrine injection) with an auto-injector "pen."  Always keep an auto-injector pen or your allergy kit with you. These could save your life. Use them as told by your doctor.  If you had to use your auto-injector pen, you must go to the emergency room even if the medicine seems to be working. This information is not intended to replace advice given to you by your health care provider. Make sure you discuss any questions you have with your health care provider. Document Released: 03/13/2008 Document Revised: 05/19/2016 Document Reviewed: 05/19/2016 Elsevier Interactive Patient Education  2017 [employer].

## 2017-10-19 NOTE — Progress Notes (Signed)
Subjective:  Patient ID: Rachael Silva, female    DOB: Aug 03, 1946  Age: 72 y.o. MRN: [id number]  CC: Rash (Has been ongoing for two days, throughtout body.  Hives, Redness and itchy.  Applied steroid cream improved the itching. Benadryl did not improve. Denies recent medications changes. )   Rash  This is a new problem. The current episode started in the past 7 days. The problem has been rapidly worsening since onset. The rash is diffuse. The rash is characterized by redness and itchiness. Associated with: immunotherapy injection on Monday. Associated symptoms include congestion and coughing. Pertinent negatives include no anorexia, diarrhea, eye pain, facial edema, fatigue, fever, joint pain, nail changes, rhinorrhea, shortness of breath, sore throat or vomiting. (Reports cough and congestion is chronic and has not changed since onset of rash 2days ago) Past treatments include antihistamine, antibiotic cream and anti-itch cream. The treatment provided mild relief. Her past medical history is significant for allergies, asthma and eczema.   Outpatient Medications Prior to Visit  Medication Sig Dispense Refill  . albuterol (ACCUNEB) 0.63 MG/3ML nebulizer solution Take 1 ampule by nebulization every 6 (six) hours as needed.      . B Complex-C-Folic Acid (STRESS B COMPLEX PO) Take 1 tablet by mouth daily.    . Calcium Carbonate-Vitamin D (CALTRATE 600+D PO) Take 1 tablet by mouth 2 (two) times daily.    Marland Kitchen co-enzyme Q-10 30 MG capsule Take 200 mg by mouth daily.    Marland Kitchen EPIPEN 2-PAK 0.3 MG/0.3ML SOAJ injection See admin instructions.  99  . fexofenadine (ALLEGRA ALLERGY) 60 MG tablet Take 60 mg by mouth daily.     . Fluticasone-Salmeterol (ADVAIR DISKUS) 250-50 MCG/DOSE AEPB Inhale 1 puff into the lungs 2 (two) times daily.     . furosemide (LASIX) 20 MG tablet TAKE 1 TABLET (20 MG TOTAL) BY MOUTH DAILY. 30 tablet 1  . GARLIC PO Take 1 capsule by mouth daily.    Marland Kitchen ibuprofen (ADVIL,MOTRIN) 800 MG  tablet TAKE 1 TABLET BY MOUTH EVERY 8 HOURS AS NEEDED FOR JOINT PAIN. TAKE WITH FOOD, STOP IT STOMACH UPSET 30 tablet 3  . levothyroxine (SYNTHROID, LEVOTHROID) 50 MCG tablet TAKE 1 TABLET BY MOUTH EVERY DAY 30 tablet 2  . metFORMIN (GLUCOPHAGE) 500 MG tablet Take 0.5 tablets (250 mg total) by mouth 2 (two) times daily with a meal. 30 tablet 11  . montelukast (SINGULAIR) 10 MG tablet TAKE 1 TABLET BY MOUTH EVERY DAY 30 tablet 1  . Multiple Vitamins-Minerals (CENTRUM ADULTS PO) Take 1 tablet by mouth daily.    Marland Kitchen omeprazole (PRILOSEC) 40 MG capsule Take 1 capsule (40 mg total) by mouth daily. (Patient taking differently: Take 20 mg by mouth daily. ) 30 capsule 5  . ONE TOUCH ULTRA TEST test strip USE TO TEST TWICE DAILY DX E09.9 100 each 0  . ONETOUCH DELICA LANCETS 33G MISC USE TO TEST SUGARS TWICE DAILY DX E09.9 100 each 0  . VENTOLIN HFA 108 (90 Base) MCG/ACT inhaler TAKE 1 TO 2 PUFFS EVERY 6 HOURS AS NEEDED 18 g 11  . gabapentin (NEURONTIN) 100 MG capsule Take 1 capsule (100 mg total) by mouth 3 (three) times daily. For menopausal sweating 90 capsule 11  . predniSONE (DELTASONE) 5 MG tablet Take 0.5 tablets (2.5 mg total) by mouth daily with breakfast. Until follow up. (Patient taking differently: Take 10 mg by mouth daily with breakfast. Until follow up.) 90 tablet 0   Facility-Administered Medications Prior to Visit  Medication Dose  Route Frequency Provider Last Rate Last Dose  . omalizumab Geoffry Paradise(XOLAIR) injection 375 mg  375 mg Subcutaneous Q14 Days Erin FullingKasa, Kurian, MD   375 mg at 08/16/17 1249  . omalizumab Geoffry Paradise(XOLAIR) injection 375 mg  375 mg Subcutaneous Q14 Days Erin FullingKasa, Kurian, MD   375 mg at 09/25/17 1638  . omalizumab Geoffry Paradise(XOLAIR) injection 375 mg  375 mg Subcutaneous Q14 Days Erin FullingKasa, Kurian, MD   375 mg at 10/11/17 1625    ROS See HPI  Objective:  BP 136/74 (BP Location: Left Arm, Patient Position: Sitting, Cuff Size: Normal)   Pulse 81   Temp 98.4 F (36.9 C) (Oral)   Ht 5\' 7"  (1.702 m)   Wt  275 lb (124.7 kg)   SpO2 98%   BMI 43.07 kg/m   BP Readings from Last 3 Encounters:  10/19/17 136/74  07/24/17 (!) 142/88  05/07/17 (!) 180/88    Wt Readings from Last 3 Encounters:  10/19/17 275 lb (124.7 kg)  07/24/17 283 lb (128.4 kg)  05/07/17 288 lb (130.6 kg)    Physical Exam  Constitutional: She is oriented to person, place, and time. No distress.  Cardiovascular: Normal rate.  Pulmonary/Chest: Effort normal. She has no wheezes. She has no rales.  Neurological: She is alert and oriented to person, place, and time.  Skin: Rash noted. Rash is urticarial. There is erythema.  Psychiatric: She has a normal mood and affect. Her behavior is normal.  Vitals reviewed.   Lab Results  Component Value Date   WBC 8.0 07/26/2016   HGB 14.8 07/26/2016   HCT 44.9 07/26/2016   PLT 213 07/26/2016   GLUCOSE 77 11/22/2016   CHOL 148 08/14/2017   TRIG 42.0 08/14/2017   HDL 64.30 08/14/2017   LDLDIRECT 138.9 04/07/2011   LDLCALC 75 08/14/2017   ALT 13 08/14/2017   AST 14 08/14/2017   NA 140 11/22/2016   K 3.7 11/22/2016   CL 103 11/22/2016   CREATININE 1.00 11/22/2016   BUN 13 11/22/2016   CO2 32 11/22/2016   TSH 2.55 05/03/2016   HGBA1C 6.4 06/19/2017   MICROALBUR <0.7 11/22/2016    Dg Shoulder Right  Result Date: 02/26/2017 CLINICAL DATA:  Acute right shoulder pain EXAM: RIGHT SHOULDER - 2+ VIEW COMPARISON:  None. FINDINGS: There is no evidence of fracture or dislocation. There is no evidence of arthropathy or other focal bone abnormality. Soft tissues are unremarkable. IMPRESSION: Negative. Electronically Signed   By: Marlan Palauharles  Clark M.D.   On: 02/26/2017 14:40    Assessment & Plan:   Rachael Silva was seen today for rash.  Diagnoses and all orders for this visit:  Hives -     methylPREDNISolone acetate (DEPO-MEDROL) injection 80 mg -     triamcinolone ointment (KENALOG) 0.1 %; Apply 1 application topically 2 (two) times daily. -     diphenhydrAMINE (BENADRYL ALLERGY)  25 MG tablet; Take 1 tablet (25 mg total) by mouth every 8 (eight) hours for 1 day, THEN 1 tablet (25 mg total) every 12 (twelve) hours for 2 days, THEN 1 tablet (25 mg total) every 8 (eight) hours as needed for up to 2 days for itching. -     ranitidine (ZANTAC) 300 MG tablet; Take 1 tablet (300 mg total) by mouth at bedtime.   I have discontinued Sinclaire Courington's gabapentin and predniSONE. I am also having her start on triamcinolone ointment, diphenhydrAMINE, and ranitidine. Additionally, I am having her maintain her albuterol, Fluticasone-Salmeterol, fexofenadine, Multiple Vitamins-Minerals (CENTRUM ADULTS PO), Calcium Carbonate-Vitamin  D (CALTRATE 600+D PO), co-enzyme Q-10, B Complex-C-Folic Acid (STRESS B COMPLEX PO), GARLIC PO, omeprazole, EPIPEN 2-PAK, VENTOLIN HFA, metFORMIN, ONETOUCH DELICA LANCETS 33G, ONE TOUCH ULTRA TEST, ibuprofen, levothyroxine, furosemide, and montelukast. We will continue to administer omalizumab, omalizumab, omalizumab, and methylPREDNISolone acetate.  Meds ordered this encounter  Medications  . methylPREDNISolone acetate (DEPO-MEDROL) injection 80 mg  . triamcinolone ointment (KENALOG) 0.1 %    Sig: Apply 1 application topically 2 (two) times daily.    Dispense:  80 g    Refill:  0    Order Specific Question:   Supervising Provider    Answer:   Dianne Dun [[id number]]  . diphenhydrAMINE (BENADRYL ALLERGY) 25 MG tablet    Sig: Take 1 tablet (25 mg total) by mouth every 8 (eight) hours for 1 day, THEN 1 tablet (25 mg total) every 12 (twelve) hours for 2 days, THEN 1 tablet (25 mg total) every 8 (eight) hours as needed for up to 2 days for itching.    Dispense:  30 tablet    Refill:  0    Order Specific Question:   Supervising Provider    Answer:   Dianne Dun [[id number]]  . ranitidine (ZANTAC) 300 MG tablet    Sig: Take 1 tablet (300 mg total) by mouth at bedtime.    Dispense:  14 tablet    Refill:  0    Order Specific Question:   Supervising Provider    Answer:    Dianne Dun [[id number]]    Follow-up: Return if symptoms worsen or fail to improve.  Alysia Penna, NP

## 2017-10-22 ENCOUNTER — Other Ambulatory Visit: Payer: Self-pay

## 2017-10-22 ENCOUNTER — Telehealth: Payer: Self-pay | Admitting: Internal Medicine

## 2017-10-22 MED ORDER — PREDNISONE 10 MG PO TABS: 10.0000 mg | ORAL_TABLET | Freq: Every day | ORAL | 0 refills | Status: DC

## 2017-10-22 NOTE — Telephone Encounter (Signed)
Pt went to her family med doc Friday. Below is note from PCP. Please advise due to [facility] and she gets allergy shots.    Rachael Silva was seen today for rash.  Diagnoses and all orders for this visit:  Hives -     methylPREDNISolone acetate (DEPO-MEDROL) injection 80 mg -     triamcinolone ointment (KENALOG) 0.1 %; Apply 1 application topically 2 (two) times daily. -     diphenhydrAMINE (BENADRYL ALLERGY) 25 MG tablet; Take 1 tablet (25 mg total) by mouth every 8 (eight) hours for 1 day, THEN 1 tablet (25 mg total) every 12 (twelve) hours for 2 days, THEN 1 tablet (25 mg total) every 8 (eight) hours as needed for up to 2 days for itching. -     ranitidine (ZANTAC) 300 MG tablet; Take 1 tablet (300 mg total) by mouth at bedtime.   I have discontinued [name]'s gabapentin and predniSONE. I am also having her start on triamcinolone ointment, diphenhydrAMINE, and ranitidine. Additionally, I am having her maintain her albuterol, Fluticasone-Salmeterol, fexofenadine, Multiple Vitamins-Minerals (CENTRUM ADULTS PO), Calcium Carbonate-Vitamin D (CALTRATE 600+D PO), co-enzyme Q-10, B Complex-C-Folic Acid (STRESS B COMPLEX PO), GARLIC PO, omeprazole, EPIPEN 2-PAK, VENTOLIN HFA, metFORMIN, ONETOUCH DELICA LANCETS 33G, ONE TOUCH ULTRA TEST, ibuprofen, levothyroxine, furosemide, and montelukast. We will continue to administer omalizumab, omalizumab, omalizumab, and methylPREDNISolone acetate.

## 2017-10-22 NOTE — Telephone Encounter (Signed)
Continue current therapy as prescribed, OK to stop prednisone for now

## 2017-10-22 NOTE — Telephone Encounter (Signed)
Received Rx refill for prednisone 2.5mg  from [facility].  [facility]nisone 10[facility]  daily.  Rx for prednisone 10mg  has been sent to preferred pharmacy.  Nothing further is needed.

## 2017-10-22 NOTE — Telephone Encounter (Signed)
Pt informed and informed per DK to continue the Xolair. Informed pt to call back if she starts having issues with her breathing due to not taking the Prednisone. Pt verbalized understanding. Nothing further needed.

## 2017-10-22 NOTE — Telephone Encounter (Signed)
Spoke with pt, she states on Jan 3 she had some swelling and hives in both arms and her ankles were red and itchy. She has an appt on Friday but thinks she needs to cancel it because she doesn't think she will be taking the injections. She was seen by a physician and was given a prednisone shot and she feels better. She has left a message for Dr. Belia HemanKasa and she is waiting to hear back from him. FYI TS. Appt cancelled.

## 2017-10-22 NOTE — Telephone Encounter (Signed)
Sorry, that happened to her, Thank you for letting me know and for cancelling her next appt., Rachael Silva. I'll call [name] to let her know I got her message. Nothing further needed. Closing.

## 2017-10-22 NOTE — Telephone Encounter (Signed)
Pt c/o medication issue:  1. Name of Medication: xolair and allergy shot   2. How are you currently taking this medication (dosage and times per day)?   3. Are you having a reaction (difficulty breathing--STAT)? Had welts and rash  went to a np  Friday and they did anaphylactic protocol   4. What is your medication issue? Allergic to one of these but has been taking both for a while

## 2017-10-23 ENCOUNTER — Encounter: Payer: Self-pay | Admitting: Family Medicine

## 2017-10-23 NOTE — Telephone Encounter (Signed)
error:[id number] ° °

## 2017-10-25 ENCOUNTER — Ambulatory Visit: Payer: Medicare Other

## 2017-10-26 ENCOUNTER — Ambulatory Visit: Payer: Medicare Other

## 2017-10-31 ENCOUNTER — Encounter: Payer: Medicare Other | Admitting: Family Medicine

## 2017-11-02 ENCOUNTER — Encounter: Payer: Self-pay | Admitting: Nurse Practitioner

## 2017-11-02 ENCOUNTER — Ambulatory Visit (INDEPENDENT_AMBULATORY_CARE_PROVIDER_SITE_OTHER): Payer: Medicare Other | Admitting: Nurse Practitioner

## 2017-11-02 VITALS — BP 140/70 | HR 79 | Temp 97.6°F | Ht 67.0 in | Wt 279.0 lb

## 2017-11-02 DIAGNOSIS — E785 Hyperlipidemia, unspecified: Secondary | ICD-10-CM | POA: Diagnosis not present

## 2017-11-02 DIAGNOSIS — K219 Gastro-esophageal reflux disease without esophagitis: Secondary | ICD-10-CM | POA: Diagnosis not present

## 2017-11-02 DIAGNOSIS — E099 Drug or chemical induced diabetes mellitus without complications: Secondary | ICD-10-CM

## 2017-11-02 DIAGNOSIS — E039 Hypothyroidism, unspecified: Secondary | ICD-10-CM

## 2017-11-02 DIAGNOSIS — M150 Primary generalized (osteo)arthritis: Secondary | ICD-10-CM

## 2017-11-02 DIAGNOSIS — M159 Polyosteoarthritis, unspecified: Secondary | ICD-10-CM

## 2017-11-02 DIAGNOSIS — T380X5A Adverse effect of glucocorticoids and synthetic analogues, initial encounter: Secondary | ICD-10-CM | POA: Diagnosis not present

## 2017-11-02 MED ORDER — MELOXICAM 7.5 MG PO TABS: 7.5000 mg | ORAL_TABLET | Freq: Every day | ORAL | 0 refills | Status: DC

## 2017-11-02 MED ORDER — NAPROXEN 500 MG PO TABS: 500.0000 mg | ORAL_TABLET | Freq: Two times a day (BID) | ORAL | 0 refills | Status: DC | PRN

## 2017-11-02 NOTE — Patient Instructions (Addendum)
Return to office for annual wellness as scheduled.  Have blood drawn on same day as wellness appt (need to be fasting at least 6-8hrs prior to blood draw).  Use meloxicam and tylenol for joint pain.  Let me know if meloxicam causes increased somnolence as well.  Encourage non weight bearing exercises to increase mobility and strength (water aerobic, stationery bicycle or chair exercise) Use walker or cane if necessary to improve stability.

## 2017-11-02 NOTE — Progress Notes (Signed)
Subjective:  Patient ID: Rachael Silva, female    DOB: 12/23/1945  Age: 72 y.o. MRN: [id number]  CC: Establish Care (transfer care/cpe--not fasting)  HPI  Transfer care from Dr. Milinda Antis.  Prednisone induced Diabetes: Home glucose: 90-140. Has not used metformin x 1year due to hypoglycemia.  Hypothyroidism: Stable with levothyroixine .  Chronic Bronchitis: Due to recent allergic reaction, xolair on hold by patient. Has upcoming appt with pulmonology next week. Current use of advair and allegra Chronic cough and nasal congestion, some relief with albuterol.  Generalized joint pain secondary to arthritis: Previously managed by oral prednisone and ibuprofen. Prednisone was discontinued and ibuprofen causes increased somnolence. No improvement with tylenol 500mg  and naproxen.  Outpatient Medications Prior to Visit  Medication Sig Dispense Refill  . albuterol (ACCUNEB) 0.63 MG/3ML nebulizer solution Take 1 ampule by nebulization every 6 (six) hours as needed.      . B Complex-C-Folic Acid (STRESS B COMPLEX PO) Take 1 tablet by mouth daily.    . Calcium Carbonate-Vitamin D (CALTRATE 600+D PO) Take 1 tablet by mouth 2 (two) times daily.    Marland Kitchen co-enzyme Q-10 30 MG capsule Take 200 mg by mouth daily.    Marland Kitchen EPIPEN 2-PAK 0.3 MG/0.3ML SOAJ injection See admin instructions.  99  . fexofenadine (ALLEGRA ALLERGY) 60 MG tablet Take 60 mg by mouth daily.     . Fluticasone-Salmeterol (ADVAIR DISKUS) 250-50 MCG/DOSE AEPB Inhale 1 puff into the lungs 2 (two) times daily.     . furosemide (LASIX) 20 MG tablet TAKE 1 TABLET (20 MG TOTAL) BY MOUTH DAILY. 30 tablet 1  . GARLIC PO Take 1 capsule by mouth daily.    Marland Kitchen levothyroxine (SYNTHROID, LEVOTHROID) 50 MCG tablet TAKE 1 TABLET BY MOUTH EVERY DAY 30 tablet 2  . montelukast (SINGULAIR) 10 MG tablet TAKE 1 TABLET BY MOUTH EVERY DAY 30 tablet 1  . Multiple Vitamins-Minerals (CENTRUM ADULTS PO) Take 1 tablet by mouth daily.    Marland Kitchen omeprazole  (PRILOSEC) 40 MG capsule Take 1 capsule (40 mg total) by mouth daily. (Patient taking differently: Take 20 mg by mouth daily. ) 30 capsule 5  . ONE TOUCH ULTRA TEST test strip USE TO TEST TWICE DAILY DX E09.9 100 each 0  . ONETOUCH DELICA LANCETS 33G MISC USE TO TEST SUGARS TWICE DAILY DX E09.9 100 each 0  . triamcinolone ointment (KENALOG) 0.1 % Apply 1 application topically 2 (two) times daily. 80 g 0  . VENTOLIN HFA 108 (90 Base) MCG/ACT inhaler TAKE 1 TO 2 PUFFS EVERY 6 HOURS AS NEEDED 18 g 11  . ibuprofen (ADVIL,MOTRIN) 800 MG tablet TAKE 1 TABLET BY MOUTH EVERY 8 HOURS AS NEEDED FOR JOINT PAIN. TAKE WITH FOOD, STOP IT STOMACH UPSET 30 tablet 3  . ranitidine (ZANTAC) 300 MG tablet Take 1 tablet (300 mg total) by mouth at bedtime. 14 tablet 0  . diphenhydrAMINE (BENADRYL ALLERGY) 25 MG tablet Take 1 tablet (25 mg total) by mouth every 8 (eight) hours for 1 day, THEN 1 tablet (25 mg total) every 12 (twelve) hours for 2 days, THEN 1 tablet (25 mg total) every 8 (eight) hours as needed for up to 2 days for itching. 30 tablet 0  . metFORMIN (GLUCOPHAGE) 500 MG tablet Take 0.5 tablets (250 mg total) by mouth 2 (two) times daily with a meal. (Patient not taking: Reported on 11/02/2017) 30 tablet 11  . predniSONE (DELTASONE) 10 MG tablet Take 1 tablet (10 mg total) by mouth daily with breakfast. (  Patient not taking: Reported on 11/02/2017) 30 tablet 0   Facility-Administered Medications Prior to Visit  Medication Dose Route Frequency Provider Last Rate Last Dose  . omalizumab Geoffry Paradise(XOLAIR) injection 375 mg  375 mg Subcutaneous Q14 Days Erin FullingKasa, Kurian, MD   375 mg at 08/16/17 1249  . omalizumab Geoffry Paradise(XOLAIR) injection 375 mg  375 mg Subcutaneous Q14 Days Erin FullingKasa, Kurian, MD   375 mg at 09/25/17 1638  . omalizumab Geoffry Paradise(XOLAIR) injection 375 mg  375 mg Subcutaneous Q14 Days Erin FullingKasa, Kurian, MD   375 mg at 10/11/17 1625    ROS See HPI  Objective:  BP 140/70   Pulse 79   Temp 97.6 F (36.4 C)   Ht 5\' 7"  (1.702 m)   Wt  279 lb (126.6 kg)   SpO2 96%   BMI 43.70 kg/m   BP Readings from Last 3 Encounters:  11/06/17 (!) 150/82  11/02/17 140/70  10/19/17 136/74    Wt Readings from Last 3 Encounters:  11/06/17 279 lb (126.6 kg)  11/02/17 279 lb (126.6 kg)  10/19/17 275 lb (124.7 kg)    Physical Exam  Constitutional: She is oriented to person, place, and time. No distress.  Cardiovascular: Normal rate, regular rhythm and normal heart sounds.  Pulmonary/Chest: Effort normal and breath sounds normal. She has no wheezes. She has no rales.  Musculoskeletal: She exhibits no edema.  Neurological: She is alert and oriented to person, place, and time.  Normal diabetic foot exam  Skin: Skin is warm and dry.  Vitals reviewed.   Lab Results  Component Value Date   WBC 8.0 07/26/2016   HGB 14.8 07/26/2016   HCT 44.9 07/26/2016   PLT 213 07/26/2016   GLUCOSE 77 11/22/2016   CHOL 148 08/14/2017   TRIG 42.0 08/14/2017   HDL 64.30 08/14/2017   LDLDIRECT 138.9 04/07/2011   LDLCALC 75 08/14/2017   ALT 13 08/14/2017   AST 14 08/14/2017   NA 140 11/22/2016   K 3.7 11/22/2016   CL 103 11/22/2016   CREATININE 1.00 11/22/2016   BUN 13 11/22/2016   CO2 32 11/22/2016   TSH 2.55 05/03/2016   HGBA1C 6.4 06/19/2017   MICROALBUR <0.7 11/22/2016    Dg Shoulder Right  Result Date: 02/26/2017 CLINICAL DATA:  Acute right shoulder pain EXAM: RIGHT SHOULDER - 2+ VIEW COMPARISON:  None. FINDINGS: There is no evidence of fracture or dislocation. There is no evidence of arthropathy or other focal bone abnormality. Soft tissues are unremarkable. IMPRESSION: Negative. Electronically Signed   By: Marlan Palauharles  Clark M.D.   On: 02/26/2017 14:40    Assessment & Plan:   Rachael BridgeDejuanna was seen today for establish care.  Diagnoses and all orders for this visit:  Hypothyroidism, unspecified type -     TSH; Future -     T4, free; Future  Gastroesophageal reflux disease, esophagitis presence not specified  Primary  osteoarthritis involving multiple joints -     Discontinue: naproxen (NAPROSYN) 500 MG tablet; Take 1 tablet (500 mg total) by mouth 2 (two) times daily as needed (for pain, take with food). -     meloxicam (MOBIC) 7.5 MG tablet; Take 1 tablet (7.5 mg total) by mouth daily. With food  Hyperlipidemia, mild -     Comprehensive metabolic panel; Future -     Lipid panel; Future  Steroid-induced diabetes mellitus (HCC) -     Comprehensive metabolic panel; Future -     Hemoglobin A1c; Future  Other orders -     Cancel:  CBC w/Diff; Future   I have discontinued Ayat Kozinski's metFORMIN, ibuprofen, ranitidine, predniSONE, and naproxen. I am also having her start on meloxicam. Additionally, I am having her maintain her albuterol, Fluticasone-Salmeterol, fexofenadine, Multiple Vitamins-Minerals (CENTRUM ADULTS PO), Calcium Carbonate-Vitamin D (CALTRATE 600+D PO), co-enzyme Q-10, B Complex-C-Folic Acid (STRESS B COMPLEX PO), GARLIC PO, omeprazole, EPIPEN 2-PAK, VENTOLIN HFA, ONETOUCH DELICA LANCETS 33G, ONE TOUCH ULTRA TEST, levothyroxine, furosemide, montelukast, triamcinolone ointment, and diphenhydrAMINE. We will continue to administer omalizumab, omalizumab, and omalizumab.  Meds ordered this encounter  Medications  . DISCONTD: naproxen (NAPROSYN) 500 MG tablet    Sig: Take 1 tablet (500 mg total) by mouth 2 (two) times daily as needed (for pain, take with food).    Dispense:  30 tablet    Refill:  0    Order Specific Question:   Supervising Provider    Answer:   Dianne Dun [[id number]]  . meloxicam (MOBIC) 7.5 MG tablet    Sig: Take 1 tablet (7.5 mg total) by mouth daily. With food    Dispense:  30 tablet    Refill:  0    Order Specific Question:   Supervising Provider    Answer:   Dianne Dun [[id number]]    Follow-up: Return in about 6 months (around 05/02/2018) for DM.  Alysia Penna, NP

## 2017-11-05 DIAGNOSIS — J301 Allergic rhinitis due to pollen: Secondary | ICD-10-CM | POA: Diagnosis not present

## 2017-11-05 NOTE — Progress Notes (Deleted)
Subjective:   Rachael Silva is a 72 y.o. female who presents for Medicare Annual (Subsequent) preventive examination.  Review of Systems: No ROS.  Medicare Wellness Visit. Additional risk factors are reflected in the social history.   Sleep patterns:  Home Safety/Smoke Alarms: Feels safe in home. Smoke alarms in place.  Living environment; residence and Firearm Safety:   Female:   Pap-       Mammo-  Last 12/20/16: BI-RADS CATEGORY  1: Negative     Dexa scan- last 10/13/14: Normal        CCS- last reported 07/20/08- normal w/ hemorrhoids  Objective:     Vitals: There were no vitals taken for this visit.  There is no height or weight on file to calculate BMI.  Advanced Directives 05/31/2016 03/02/2015  Does Patient Have a Medical Advance Directive? No No  Would patient like information on creating a medical advance directive? Yes - Transport planner given No - patient declined information    Tobacco Social History   Tobacco Use  Smoking Status Never Smoker  Smokeless Tobacco Never Used     Counseling given: Not Answered   Clinical Intake:                       Past Medical History:  Diagnosis Date  . Allergy    allergic rhinitis  . Angioedema 02/2006  . Arthritis    OA of knees  . Asthma   . Complication of anesthesia    apnea- sleep  . Frequent headaches   . Hiatal hernia   . Hypertension   . Hypothyroid   . Labile blood pressure   . Lower extremity edema   . Lung disease   . Pneumonia   . Pneumonia, eosinophilic (HCC)   . Shortness of breath dyspnea   . Sleep apnea   . Urinary incontinence   . Wheezing    Past Surgical History:  Procedure Laterality Date  . BREAST BIOPSY Left 2013   NEG  . BREAST SURGERY  1966   breast biopsy  . CESAREAN SECTION     x2  . FLEXIBLE BRONCHOSCOPY N/A 09/16/2015   Procedure: FLEXIBLE BRONCHOSCOPY WITH FLURO ;  Surgeon: Stephanie Acre, MD;  Location: [facility] ORS;  Service: Pulmonary;  Laterality: N/A;    Family History  Problem Relation Age of Onset  . Diabetes Mother        pre-diabetic  . Heart disease Mother        ? heart disease  . Emphysema Mother   . Thyroid disease Mother   . Diabetes Maternal Uncle   . Breast cancer Neg Hx    Social History   Socioeconomic History  . Marital status: Single    Spouse name: Not on file  . Number of children: 2  . Years of education: Not on file  . Highest education level: Not on file  Social Needs  . Financial resource strain: Not on file  . Food insecurity - worry: Not on file  . Food insecurity - inability: Not on file  . Transportation needs - medical: Not on file  . Transportation needs - non-medical: Not on file  Occupational History  . Occupation: retired    Associate Professor: RETIRED  Tobacco Use  . Smoking status: Never Smoker  . Smokeless tobacco: Never Used  Substance and Sexual Activity  . Alcohol use: No    Alcohol/week: 0.0 oz  . Drug use: No  . Sexual activity: No  Other Topics Concern  . Not on file  Social History Narrative  . Not on file    Outpatient Encounter Medications as of 11/07/2017  Medication Sig  . albuterol (ACCUNEB) 0.63 MG/3ML nebulizer solution Take 1 ampule by nebulization every 6 (six) hours as needed.    . B Complex-C-Folic Acid (STRESS B COMPLEX PO) Take 1 tablet by mouth daily.  . Calcium Carbonate-Vitamin D (CALTRATE 600+D PO) Take 1 tablet by mouth 2 (two) times daily.  Marland Kitchen co-enzyme Q-10 30 MG capsule Take 200 mg by mouth daily.  . diphenhydrAMINE (BENADRYL ALLERGY) 25 MG tablet Take 1 tablet (25 mg total) by mouth every 8 (eight) hours for 1 day, THEN 1 tablet (25 mg total) every 12 (twelve) hours for 2 days, THEN 1 tablet (25 mg total) every 8 (eight) hours as needed for up to 2 days for itching.  Marland Kitchen EPIPEN 2-PAK 0.3 MG/0.3ML SOAJ injection See admin instructions.  . fexofenadine (ALLEGRA ALLERGY) 60 MG tablet Take 60 mg by mouth daily.   . Fluticasone-Salmeterol (ADVAIR DISKUS) 250-50 MCG/DOSE  AEPB Inhale 1 puff into the lungs 2 (two) times daily.   . furosemide (LASIX) 20 MG tablet TAKE 1 TABLET (20 MG TOTAL) BY MOUTH DAILY.  Marland Kitchen GARLIC PO Take 1 capsule by mouth daily.  Marland Kitchen levothyroxine (SYNTHROID, LEVOTHROID) 50 MCG tablet TAKE 1 TABLET BY MOUTH EVERY DAY  . meloxicam (MOBIC) 7.5 MG tablet Take 1 tablet (7.5 mg total) by mouth daily. With food  . montelukast (SINGULAIR) 10 MG tablet TAKE 1 TABLET BY MOUTH EVERY DAY  . Multiple Vitamins-Minerals (CENTRUM ADULTS PO) Take 1 tablet by mouth daily.  Marland Kitchen omeprazole (PRILOSEC) 40 MG capsule Take 1 capsule (40 mg total) by mouth daily. (Patient taking differently: Take 20 mg by mouth daily. )  . ONE TOUCH ULTRA TEST test strip USE TO TEST TWICE DAILY DX E09.9  . ONETOUCH DELICA LANCETS 33G MISC USE TO TEST SUGARS TWICE DAILY DX E09.9  . triamcinolone ointment (KENALOG) 0.1 % Apply 1 application topically 2 (two) times daily.  . VENTOLIN HFA 108 (90 Base) MCG/ACT inhaler TAKE 1 TO 2 PUFFS EVERY 6 HOURS AS NEEDED   Facility-Administered Encounter Medications as of 11/07/2017  Medication  . omalizumab Geoffry Paradise) injection 375 mg  . omalizumab Geoffry Paradise) injection 375 mg  . omalizumab Geoffry Paradise) injection 375 mg    Activities of Daily Living No flowsheet data found.  Patient Care Team: Tower, Audrie Gallus, MD as PCP - General    Assessment:   This is a routine wellness examination for Rachael Silva. Physical assessment deferred to PCP.  Exercise Activities and Dietary recommendations   Diet (meal preparation, eat out, water intake, caffeinated beverages, dairy products, fruits and vegetables): {Desc; diets:16563} Breakfast: Lunch:  Dinner:      Goals    None      Fall Risk Fall Risk  10/19/2017 05/31/2016 05/17/2016 05/27/2015 03/02/2015  Falls in the past year? No No No No No    Depression Screen PHQ 2/9 Scores 10/19/2017 05/31/2016 05/17/2016 03/02/2015  PHQ - 2 Score 0 0 - 0  Exception Documentation - - Patient refusal -     Cognitive  Function MMSE - Mini Mental State Exam 05/31/2016  Orientation to time 5  Orientation to Place 5  Registration 3  Attention/ Calculation 0  Recall 2  Recall-comments pt was unable to recall 1 of 3 words  Language- name 2 objects 0  Language- repeat 1  Language- follow 3 step command 3  Language- read & follow direction 0  Write a sentence 0  Copy design 0  Total score 19        Immunization History  Administered Date(s) Administered  . Influenza Split 07/27/2011, 08/19/2012  . Influenza Whole 08/01/2007, 07/13/2008, 07/20/2009, 07/20/2010  . Influenza,inj,Quad PF,6+ Mos 07/29/2013, 07/15/2014, 07/09/2015, 07/14/2016, 06/19/2017  . Pneumococcal Conjugate-13 05/31/2016  . Pneumococcal Polysaccharide-23 10/09/2001, 01/13/2014  . Td 10/10/2003    Screening Tests Health Maintenance  Topic Date Due  . TETANUS/TDAP  10/04/2018 (Originally 10/09/2013)  . URINE MICROALBUMIN  11/22/2017  . FOOT EXAM  12/01/2017  . HEMOGLOBIN A1C  12/17/2017  . OPHTHALMOLOGY EXAM  05/15/2018  . COLONOSCOPY  07/20/2018  . MAMMOGRAM  12/21/2018  . INFLUENZA VACCINE  Completed  . DEXA SCAN  Completed  . Hepatitis C Screening  Completed  . PNA vac Low Risk Adult  Completed      Plan:   ***   I have personally reviewed and noted the following in the patient's chart:   . Medical and social history . Use of alcohol, tobacco or illicit drugs  . Current medications and supplements . Functional ability and status . Nutritional status . Physical activity . Advanced directives . List of other physicians . Hospitalizations, surgeries, and ER visits in previous 12 months . Vitals . Screenings to include cognitive, depression, and falls . Referrals and appointments  In addition, I have reviewed and discussed with patient certain preventive protocols, quality metrics, and best practice recommendations. A written personalized care plan for preventive services as well as general preventive health  recommendations were provided to patient.     Avon GullyBritt, Ashleigh Arya Angel, [place]RN  11/05/2017

## 2017-11-06 ENCOUNTER — Other Ambulatory Visit
Admission: RE | Admit: 2017-11-06 | Discharge: 2017-11-06 | Disposition: A | Discharge status: Home / Self Care | Payer: Medicare Other | Source: Ambulatory Visit | Attending: Internal Medicine | Admitting: Internal Medicine

## 2017-11-06 ENCOUNTER — Ambulatory Visit (INDEPENDENT_AMBULATORY_CARE_PROVIDER_SITE_OTHER): Payer: Medicare Other | Admitting: Internal Medicine

## 2017-11-06 ENCOUNTER — Encounter: Payer: Self-pay | Admitting: Internal Medicine

## 2017-11-06 VITALS — BP 150/82 | HR 93 | Resp 16 | Ht 67.0 in | Wt 279.0 lb

## 2017-11-06 DIAGNOSIS — J4550 Severe persistent asthma, uncomplicated: Secondary | ICD-10-CM | POA: Insufficient documentation

## 2017-11-06 MED ORDER — AZITHROMYCIN 250 MG PO TABS: ORAL_TABLET | ORAL | 0 refills | Status: DC

## 2017-11-06 MED ORDER — TIOTROPIUM BROMIDE MONOHYDRATE 1.25 MCG/ACT IN AERS: 2.0000 | INHALATION_SPRAY | Freq: Two times a day (BID) | RESPIRATORY_TRACT | 5 refills | Status: DC

## 2017-11-06 NOTE — Progress Notes (Signed)
MRN# [id number] Rachael Silva Mar 28, 1946   CC: Chief Complaint  Patient presents with  . Sleep Apnea  . Asthma   synopsis: 72 year old female past medical history of asthma. Being followed by pulmonary for chronic eosinophilic pneumonia, status post bronchoscopy in December 2016 with negative cultures for AFB and fungus. Transbronchial biopsy-positive for eosinophilic infiltrate. Treated with steroids unable to wean since  October 2015.   CC follow up ASTHMA HPI Patient presents today for follow-up visit of her ABPA, asthma, chronic prednisone use secondary to lung disease. She stopped prednisone 10/05/17 She had Xolair shot on Jan 3rd and developed nodular red rash 3 days later  She has cough and wheezing and chest congestion and did NOT receive her second dose of Xolair in January  Still Receiving allergy shots once weekly. Since her last visit she's not had any further exacerbations of eosinophilic pneumonia or ABPA.  No acute signs of infection at this time No wheezing no shortness of breath No dyspnea on exertion Her asthma seems to be NOT controlled after stopping Xolair and prednisone  She is constantly being exposed to allergens and cats at home  after further discussion, patient is constantly exposed to cat dander and thick carpet I have advised that she is always being exposed to triggers.  Review of Systems: Gen:  Denies  fever, sweats, chills HEENT: Denies blurred vision, double vision, ear pain, eye pain, hearing loss, nose bleeds, sore throat Cvc:  No dizziness, chest pain or heaviness Resp:  +wheezes +shortness of breath +cough Gi: Denies swallowing difficulty, stomach pain, nausea or vomiting, diarrhea, constipation, bowel incontinence Gu:  Denies bladder incontinence, burning urine Ext:   No Joint pain, stiffness or swelling Skin: No skin rash, easy bruising or bleeding or hives Endoc:  No polyuria, polydipsia , polyphagia or weight change Other:  All other  systems negative  Allergies:  Atorvastatin and Erythromycin  Physical Examination:  VS: BP (!) 150/82 (BP Location: Left Arm, Cuff Size: Large)   Pulse 93   Resp 16   Ht 5\' 7"  (1.702 m)   Wt 279 lb (126.6 kg)   SpO2 92%   BMI 43.70 kg/m   General Appearance: No distress  HEENT: PERRLA, no ptosis, no other lesions noticed. No bruits Pulmonary:normal breath sounds., diaphragmatic excursion normal.No wheezing, No rales   Cardiovascular:  Normal S1,S2.  No m/r/g.     Abdomen:Exam: Benign, Soft, non-tender, No masses  Skin:   warm, no rashes, no ecchymosis  Extremities: normal, no cyanosis, clubbing, warm with normal capillary refill.     LABS Results for Rachael, Silva (MRN [id number]) as of 08/01/2016 11:16  Ref. Range 07/02/2014 13:00 08/05/2015 15:12 03/30/2016 15:11  IgE (Immunoglobulin E), Serum Latest Ref Range: 0 - 100 IU/mL 2,401 (H) 1,253 (H) 1,180 (H)   Results for Rachael, Silva (MRN [id number]) as of 08/01/2016 11:16  Ref. Range 08/05/2015 15:12 09/16/2015 12:46 03/30/2016 15:11 05/03/2016 08:42 07/26/2016 15:31  Eosinophil Latest Units: % 10 11 5  5.4 (H) 3        Assessment and Plan:   72 year old female with chronic cough, recurrent URIs, being treated as ABPA/chronic eosinophilic pneumonia, now off of  Xolair therapy due to rash and allergic reaction which was being suppressed by chornic prednisone therapy and now that she has stopped steroids, her symptoms of ASTHMA have returned.  We will need to assess for Fesenra  Asthma, chronic/chronic eosinophilic pneumonia -Patient has severe persistent  chronic asthma which seems to be well-controlled with Xolair and prednisone  but after further discussion, patient can NOT continue with Xolair due to reaction and can NOT continue with prednisone due to chronic side affects    Plan -STOP  Xolair , reassess with Miles CostainFesanra -will NOT resume prednisone -Avoid any allergens-she is constantly around cats and dogs along with  carpet I have advised patient that she needs to make choices with her environmental exposures-remove cats and remove carpets It seems that when she cleans her moms house that has heavy carpet, she has reactive airways disease from all dust Continue Advair daily, will ADD Spiriva Continue allergy regiment and allergy shot Avoid sick contacts. Z pak ordered for mild productive cough    ABPA (allergic bronchopulmonary aspergillosis) Patient educated on side effects of steroids (weight gain, wereretention, increased appetite, muscle weakness, bone degradation, dysfunctional calcium levels, elevated blood glucose levels, anxiety/agitation at night, decreased immune). -will not restart prednisone at this time  Patient educated on the current diagnosis, she understands that this is not an active infection, it is most likely a hyperreactive reaction to antigen exposure.  She does not need/require antifungal treatment at this time. This is her initial treatment course, if she has refractory or recurrent symptoms will consider adding antifungals at that time.  Bronchoscopy 09/2015: All cultures including fungal, bacterial, AFB a currently negative to date. Transbronchial biopsy showed eosinophilic infiltration consistent with eosinophilic pneumonia in 2016 Bronhoscopy with BAL 03/2016: negative cultures.      OSA on CPAP Cont with CPAP nightly CPAP 11cm H20 Compliance report shows 100% at 30 days as well as 100% for more than 4 hours a day Her AHI is 1.8 she is on auto CPAP pressure 11-15 7 m of water pressure Last OV   Patient satisfied with Plan of action and management. All questions answered Follow-up in 2-3 months for assessment resp status  Lucie LeatherKurian David Agam Davenport, M.D.  [facility]  Medical Director [facility] Medical Director [facility]

## 2017-11-06 NOTE — Patient Instructions (Signed)
STOP XOLAIR STOP PREDNISONE CONTINUE ADVAIR START SPIRIVA RESPIMAT 1.25 AVOID CATS/ALLERGENS

## 2017-11-07 ENCOUNTER — Encounter: Payer: Self-pay | Admitting: Behavioral Health

## 2017-11-07 ENCOUNTER — Ambulatory Visit: Payer: Medicare Other | Admitting: Behavioral Health

## 2017-11-07 ENCOUNTER — Other Ambulatory Visit: Payer: Self-pay | Admitting: Nurse Practitioner

## 2017-11-07 ENCOUNTER — Ambulatory Visit (INDEPENDENT_AMBULATORY_CARE_PROVIDER_SITE_OTHER): Payer: Medicare Other | Admitting: Behavioral Health

## 2017-11-07 VITALS — BP 140/68 | HR 70 | Wt 278.6 lb

## 2017-11-07 DIAGNOSIS — T380X5A Adverse effect of glucocorticoids and synthetic analogues, initial encounter: Secondary | ICD-10-CM

## 2017-11-07 DIAGNOSIS — R609 Edema, unspecified: Secondary | ICD-10-CM

## 2017-11-07 DIAGNOSIS — Z0000 Encounter for general adult medical examination without abnormal findings: Secondary | ICD-10-CM | POA: Diagnosis not present

## 2017-11-07 DIAGNOSIS — E099 Drug or chemical induced diabetes mellitus without complications: Secondary | ICD-10-CM

## 2017-11-07 DIAGNOSIS — E785 Hyperlipidemia, unspecified: Secondary | ICD-10-CM

## 2017-11-07 DIAGNOSIS — E039 Hypothyroidism, unspecified: Secondary | ICD-10-CM

## 2017-11-07 NOTE — Progress Notes (Signed)
Lab orders,printe out req/ called pt left vm for her to pick it up and take it with her to get her lab work done.

## 2017-11-07 NOTE — Patient Instructions (Signed)
Please go to lab today.  Continue to eat heart healthy diet (full of fruits, vegetables, whole grains, lean protein, water--limit salt, fat, and sugar intake) and increase physical activity as tolerated.  Continue doing brain stimulating activities (puzzles, reading, adult coloring books, staying active) to keep memory sharp.    Rachael Silva , Thank you for taking time to come for your Medicare Wellness Visit. I appreciate your ongoing commitment to your health goals. Please review the following plan we discussed and let me know if I can assist you in the future.   These are the goals we discussed: Goals    . Weight (lb) < 250 lb (113.4 kg)       This is a list of the screening recommended for you and due dates:  Health Maintenance  Topic Date Due  . Tetanus Vaccine  10/04/2018*  . Urine Protein Check  11/22/2017  . Complete foot exam   12/01/2017  . Hemoglobin A1C  12/17/2017  . Eye exam for diabetics  05/15/2018  . Colon Cancer Screening  07/20/2018  . Mammogram  12/21/2018  . Flu Shot  Completed  . DEXA scan (bone density measurement)  Completed  .  Hepatitis C: One time screening is recommended by Center for Disease Control  (CDC) for  adults born from [birth year] through 1963.   Completed  . Pneumonia vaccines  Completed  *Topic was postponed. The date shown is not the original due date.    Health Maintenance for Postmenopausal Women Menopause is a normal process in which your reproductive ability comes to an end. This process happens gradually over a span of months to years, usually between the ages of 55 and 80. Menopause is complete when you have missed 12 consecutive menstrual periods. It is important to talk with your health care provider about some of the most common conditions that affect postmenopausal women, such as heart disease, cancer, and bone loss (osteoporosis). Adopting a healthy lifestyle and getting preventive care can help to promote your health and wellness. Those  actions can also lower your chances of developing some of these common conditions. What should I know about menopause? During menopause, you may experience a number of symptoms, such as:  Moderate-to-severe hot flashes.  Night sweats.  Decrease in sex drive.  Mood swings.  Headaches.  Tiredness.  Irritability.  Memory problems.  Insomnia.  Choosing to treat or not to treat menopausal changes is an individual decision that you make with your health care provider. What should I know about hormone replacement therapy and supplements? Hormone therapy products are effective for treating symptoms that are associated with menopause, such as hot flashes and night sweats. Hormone replacement carries certain risks, especially as you become older. If you are thinking about using estrogen or estrogen with progestin treatments, discuss the benefits and risks with your health care provider. What should I know about heart disease and stroke? Heart disease, heart attack, and stroke become more likely as you age. This may be due, in part, to the hormonal changes that your body experiences during menopause. These can affect how your body processes dietary fats, triglycerides, and cholesterol. Heart attack and stroke are both medical emergencies. There are many things that you can do to help prevent heart disease and stroke:  Have your blood pressure checked at least every 1-2 years. High blood pressure causes heart disease and increases the risk of stroke.  If you are 3-54 years old, ask your health care provider if you should  take aspirin to prevent a heart attack or a stroke.  Do not use any tobacco products, including cigarettes, chewing tobacco, or electronic cigarettes. If you need help quitting, ask your health care provider.  It is important to eat a healthy diet and maintain a healthy weight. ? Be sure to include plenty of vegetables, fruits, low-fat dairy products, and lean  protein. ? Avoid eating foods that are high in solid fats, added sugars, or salt (sodium).  Get regular exercise. This is one of the most important things that you can do for your health. ? Try to exercise for at least 150 minutes each week. The type of exercise that you do should increase your heart rate and make you sweat. This is known as moderate-intensity exercise. ? Try to do strengthening exercises at least twice each week. Do these in addition to the moderate-intensity exercise.  Know your numbers.Ask your health care provider to check your cholesterol and your blood glucose. Continue to have your blood tested as directed by your health care provider.  What should I know about cancer screening? There are several types of cancer. Take the following steps to reduce your risk and to catch any cancer development as early as possible. Breast Cancer  Practice breast self-awareness. ? This means understanding how your breasts normally appear and feel. ? It also means doing regular breast self-exams. Let your health care provider know about any changes, no matter how small.  If you are 62 or older, have a clinician do a breast exam (clinical breast exam or CBE) every year. Depending on your age, family history, and medical history, it may be recommended that you also have a yearly breast X-ray (mammogram).  If you have a family history of breast cancer, talk with your health care provider about genetic screening.  If you are at high risk for breast cancer, talk with your health care provider about having an MRI and a mammogram every year.  Breast cancer (BRCA) gene test is recommended for women who have family members with BRCA-related cancers. Results of the assessment will determine the need for genetic counseling and BRCA1 and for BRCA2 testing. BRCA-related cancers include these types: ? Breast. This occurs in males or females. ? Ovarian. ? Tubal. This may also be called fallopian tube  cancer. ? Cancer of the abdominal or pelvic lining (peritoneal cancer). ? Prostate. ? Pancreatic.  Cervical, Uterine, and Ovarian Cancer Your health care provider may recommend that you be screened regularly for cancer of the pelvic organs. These include your ovaries, uterus, and vagina. This screening involves a pelvic exam, which includes checking for microscopic changes to the surface of your cervix (Pap test).  For women ages 21-65, health care providers may recommend a pelvic exam and a Pap test every three years. For women ages 83-65, they may recommend the Pap test and pelvic exam, combined with testing for human papilloma virus (HPV), every five years. Some types of HPV increase your risk of cervical cancer. Testing for HPV may also be done on women of any age who have unclear Pap test results.  Other health care providers may not recommend any screening for nonpregnant women who are considered low risk for pelvic cancer and have no symptoms. Ask your health care provider if a screening pelvic exam is right for you.  If you have had past treatment for cervical cancer or a condition that could lead to cancer, you need Pap tests and screening for cancer for at least  20 years after your treatment. If Pap tests have been discontinued for you, your risk factors (such as having a new sexual partner) need to be reassessed to determine if you should start having screenings again. Some women have medical problems that increase the chance of getting cervical cancer. In these cases, your health care provider may recommend that you have screening and Pap tests more often.  If you have a family history of uterine cancer or ovarian cancer, talk with your health care provider about genetic screening.  If you have vaginal bleeding after reaching menopause, tell your health care provider.  There are currently no reliable tests available to screen for ovarian cancer.  Lung Cancer Lung cancer screening is  recommended for adults 46-109 years old who are at high risk for lung cancer because of a history of smoking. A yearly low-dose CT scan of the lungs is recommended if you:  Currently smoke.  Have a history of at least 30 pack-years of smoking and you currently smoke or have quit within the past 15 years. A pack-year is smoking an average of one pack of cigarettes per day for one year.  Yearly screening should:  Continue until it has been 15 years since you quit.  Stop if you develop a health problem that would prevent you from having lung cancer treatment.  Colorectal Cancer  This type of cancer can be detected and can often be prevented.  Routine colorectal cancer screening usually begins at age 31 and continues through age 47.  If you have risk factors for colon cancer, your health care provider may recommend that you be screened at an earlier age.  If you have a family history of colorectal cancer, talk with your health care provider about genetic screening.  Your health care provider may also recommend using home test kits to check for hidden blood in your stool.  A small camera at the end of a tube can be used to examine your colon directly (sigmoidoscopy or colonoscopy). This is done to check for the earliest forms of colorectal cancer.  Direct examination of the colon should be repeated every 5-10 years until age 51. However, if early forms of precancerous polyps or small growths are found or if you have a family history or genetic risk for colorectal cancer, you may need to be screened more often.  Skin Cancer  Check your skin from head to toe regularly.  Monitor any moles. Be sure to tell your health care provider: ? About any new moles or changes in moles, especially if there is a change in a mole's shape or color. ? If you have a mole that is larger than the size of a pencil eraser.  If any of your family members has a history of skin cancer, especially at a young age,  talk with your health care provider about genetic screening.  Always use sunscreen. Apply sunscreen liberally and repeatedly throughout the day.  Whenever you are outside, protect yourself by wearing long sleeves, pants, a wide-brimmed hat, and sunglasses.  What should I know about osteoporosis? Osteoporosis is a condition in which bone destruction happens more quickly than new bone creation. After menopause, you may be at an increased risk for osteoporosis. To help prevent osteoporosis or the bone fractures that can happen because of osteoporosis, the following is recommended:  If you are 60-19 years old, get at least 1,000 mg of calcium and at least 600 mg of vitamin D per day.  If you  are older than age 66 but younger than age 26, get at least 1,200 mg of calcium and at least 600 mg of vitamin D per day.  If you are older than age 3, get at least 1,200 mg of calcium and at least 800 mg of vitamin D per day.  Smoking and excessive alcohol intake increase the risk of osteoporosis. Eat foods that are rich in calcium and vitamin D, and do weight-bearing exercises several times each week as directed by your health care provider. What should I know about how menopause affects my mental health? Depression may occur at any age, but it is more common as you become older. Common symptoms of depression include:  Low or sad mood.  Changes in sleep patterns.  Changes in appetite or eating patterns.  Feeling an overall lack of motivation or enjoyment of activities that you previously enjoyed.  Frequent crying spells.  Talk with your health care provider if you think that you are experiencing depression. What should I know about immunizations? It is important that you get and maintain your immunizations. These include:  Tetanus, diphtheria, and pertussis (Tdap) booster vaccine.  Influenza every year before the flu season begins.  Pneumonia vaccine.  Shingles vaccine.  Your health care  provider may also recommend other immunizations. This information is not intended to replace advice given to you by your health care provider. Make sure you discuss any questions you have with your health care provider. Document Released: 11/17/2005 Document Revised: 04/14/2016 Document Reviewed: 06/29/2015 Elsevier Interactive Patient Education  2018 [employer].

## 2017-11-07 NOTE — Progress Notes (Signed)
Subjective:   Rachael Silva is a 72 y.o. female who presents for Medicare Annual (Subsequent) preventive examination.  Review of Systems: No ROS.  Medicare Wellness Visit. Additional risk factors are reflected in the social history. Cardiac Risk Factors include: advanced age (>6455men, 25>65 women);dyslipidemia;obesity (BMI >30kg/m2);sedentary lifestyle Sleep patterns:  Wears CPAP.  Sleeps 7-8 hrs. Feels rested. Home Safety/Smoke Alarms: Feels safe in home. Smoke alarms in place.  Living environment; residence and Firearm Safety: Stairs in town home. Lives alone. Cat.  Seat Belt Safety/Bike Helmet: Wears seat belt.   Female:   Pap- No longer doing routine screening due to age.      Mammo- last 12/20/16-normal      Dexa scan- last 10/13/14: normal       CCS- last reported 07/20/08 normal per pt.     Objective:     Vitals: BP 140/68 (BP Location: Left Arm, Patient Position: Sitting, Cuff Size: Large)   Pulse 70   Wt 278 lb 9.6 oz (126.4 kg)   SpO2 98%   BMI 43.63 kg/m   Body mass index is 43.63 kg/m.  Advanced Directives 11/07/2017 05/31/2016 03/02/2015  Does Patient Have a Medical Advance Directive? No No No  Would patient like information on creating a medical advance directive? No - Patient declined Yes English as a second language teacher- Educational materials given No - patient declined information    Tobacco Social History   Tobacco Use  Smoking Status Never Smoker  Smokeless Tobacco Never Used     Counseling given: Not Answered   Clinical Intake: Pain : No/denies pain     Past Medical History:  Diagnosis Date  . Allergy    allergic rhinitis  . Angioedema 02/2006  . Arthritis    OA of knees  . Asthma   . Complication of anesthesia    apnea- sleep  . Frequent headaches   . Hiatal hernia   . Hypertension   . Hypothyroid   . Labile blood pressure   . Lower extremity edema   . Lung disease   . Pneumonia   . Pneumonia, eosinophilic (HCC)   . Shortness of breath dyspnea   . Sleep apnea     . Urinary incontinence   . Wheezing    Past Surgical History:  Procedure Laterality Date  . BREAST BIOPSY Left 2013   NEG  . BREAST SURGERY  1966   breast biopsy  . CESAREAN SECTION     x2  . FLEXIBLE BRONCHOSCOPY N/A 09/16/2015   Procedure: FLEXIBLE BRONCHOSCOPY WITH FLURO ;  Surgeon: Stephanie AcreVishal Mungal, MD;  Location: [facility] ORS;  Service: Pulmonary;  Laterality: N/A;   Family History  Problem Relation Age of Onset  . Diabetes Mother        pre-diabetic  . Heart disease Mother        ? heart disease  . Emphysema Mother   . Thyroid disease Mother   . Diabetes Maternal Uncle   . Breast cancer Neg Hx    Social History   Socioeconomic History  . Marital status: Single    Spouse name: None  . Number of children: 2  . Years of education: None  . Highest education level: None  Social Needs  . Financial resource strain: None  . Food insecurity - worry: None  . Food insecurity - inability: None  . Transportation needs - medical: None  . Transportation needs - non-medical: None  Occupational History  . Occupation: retired    Associate Professormployer: RETIRED  Tobacco Use  .  Smoking status: Never Smoker  . Smokeless tobacco: Never Used  Substance and Sexual Activity  . Alcohol use: No    Alcohol/week: 0.0 oz  . Drug use: No  . Sexual activity: No  Other Topics Concern  . None  Social History Narrative  . None    Outpatient Encounter Medications as of 11/07/2017  Medication Sig  . albuterol (ACCUNEB) 0.63 MG/3ML nebulizer solution Take 1 ampule by nebulization every 6 (six) hours as needed.    Marland Kitchen azithromycin (ZITHROMAX Z-PAK) 250 MG tablet Take 2 tablets on Day 1 and then 1 tablet daily till gone.  . B Complex-C-Folic Acid (STRESS B COMPLEX PO) Take 1 tablet by mouth daily.  . Calcium Carbonate-Vitamin D (CALTRATE 600+D PO) Take 1 tablet by mouth 2 (two) times daily.  Marland Kitchen co-enzyme Q-10 30 MG capsule Take 200 mg by mouth daily.  Marland Kitchen EPIPEN 2-PAK 0.3 MG/0.3ML SOAJ injection See admin  instructions.  . fexofenadine (ALLEGRA ALLERGY) 60 MG tablet Take 60 mg by mouth daily.   . Fluticasone-Salmeterol (ADVAIR DISKUS) 250-50 MCG/DOSE AEPB Inhale 1 puff into the lungs 2 (two) times daily.   . furosemide (LASIX) 20 MG tablet TAKE 1 TABLET (20 MG TOTAL) BY MOUTH DAILY.  Marland Kitchen GARLIC PO Take 1 capsule by mouth daily.  . montelukast (SINGULAIR) 10 MG tablet TAKE 1 TABLET BY MOUTH EVERY DAY  . Multiple Vitamins-Minerals (CENTRUM ADULTS PO) Take 1 tablet by mouth daily.  . naproxen (NAPROSYN) 500 MG tablet TAKE 1 TABLET (500 MG TOTAL) BY MOUTH 2 (TWO) TIMES DAILY AS NEEDED (FOR PAIN, TAKE WITH FOOD).  Marland Kitchen omeprazole (PRILOSEC) 40 MG capsule Take 1 capsule (40 mg total) by mouth daily. (Patient taking differently: Take 20 mg by mouth daily. )  . ONE TOUCH ULTRA TEST test strip USE TO TEST TWICE DAILY DX E09.9  . ONETOUCH DELICA LANCETS 33G MISC USE TO TEST SUGARS TWICE DAILY DX E09.9  . Tiotropium Bromide Monohydrate (SPIRIVA RESPIMAT) 1.25 MCG/ACT AERS Inhale 2 puffs into the lungs 2 (two) times daily.  Marland Kitchen triamcinolone ointment (KENALOG) 0.1 % Apply 1 application topically 2 (two) times daily.  . VENTOLIN HFA 108 (90 Base) MCG/ACT inhaler TAKE 1 TO 2 PUFFS EVERY 6 HOURS AS NEEDED  . diphenhydrAMINE (BENADRYL ALLERGY) 25 MG tablet Take 1 tablet (25 mg total) by mouth every 8 (eight) hours for 1 day, THEN 1 tablet (25 mg total) every 12 (twelve) hours for 2 days, THEN 1 tablet (25 mg total) every 8 (eight) hours as needed for up to 2 days for itching.  . levothyroxine (SYNTHROID, LEVOTHROID) 50 MCG tablet TAKE 1 TABLET BY MOUTH EVERY DAY (Patient not taking: Reported on 11/07/2017)  . meloxicam (MOBIC) 7.5 MG tablet Take 1 tablet (7.5 mg total) by mouth daily. With food (Patient not taking: Reported on 11/07/2017)   Facility-Administered Encounter Medications as of 11/07/2017  Medication  . omalizumab Geoffry Paradise) injection 375 mg  . omalizumab Geoffry Paradise) injection 375 mg  . omalizumab Geoffry Paradise)  injection 375 mg    Activities of Daily Living In your present state of health, do you have any difficulty performing the following activities: 11/07/2017  Hearing? Y  Comment Has hearing aids. not wearing them today.  Vision? N  Comment Wearing glasses. last eye exam 07/09/17.  Difficulty concentrating or making decisions? N  Walking or climbing stairs? Y  Comment difficulty with knee pain  Dressing or bathing? N  Doing errands, shopping? N  Preparing Food and eating ? N  Using  the Toilet? N  In the past six months, have you accidently leaked urine? Y  Comment wears pads  Do you have problems with loss of bowel control? N  Managing your Medications? N  Managing your Finances? N  Housekeeping or managing your Housekeeping? N  Some recent data might be hidden    Patient Care Team: Nche, Bonna Gains, NP as PCP - General (Internal Medicine)    Assessment:   This is a routine wellness examination for [place]. Physical assessment deferred to PCP.  Exercise Activities and Dietary recommendations Current Exercise Habits: The patient does not participate in regular exercise at present, Exercise limited by: None identified Diet (meal preparation, eat out, water intake, caffeinated beverages, dairy products, fruits and vegetables): in general, a "healthy" diet  , well balanced    Goals    . Weight (lb) < 250 lb (113.4 kg)       Fall Risk Fall Risk  11/07/2017 10/19/2017 05/31/2016 05/17/2016 05/27/2015  Falls in the past year? No No No No No    Depression Screen PHQ 2/9 Scores 11/07/2017 10/19/2017 05/31/2016 05/17/2016  PHQ - 2 Score 0 0 0 -  Exception Documentation - - - Patient refusal     Cognitive Function MMSE - Mini Mental State Exam 11/07/2017 05/31/2016  Orientation to time 5 5  Orientation to Place 5 5  Registration 3 3  Attention/ Calculation 5 0  Recall 3 2  Recall-comments - pt was unable to recall 1 of 3 words  Language- name 2 objects 2 0  Language- repeat 1 1    Language- follow 3 step command 3 3  Language- read & follow direction 1 0  Write a sentence 1 0  Copy design 1 0  Total score 30 19        Immunization History  Administered Date(s) Administered  . Influenza Split 07/27/2011, 08/19/2012  . Influenza Whole 08/01/2007, 07/13/2008, 07/20/2009, 07/20/2010  . Influenza,inj,Quad PF,6+ Mos 07/29/2013, 07/15/2014, 07/09/2015, 07/14/2016, 06/19/2017  . Pneumococcal Conjugate-13 05/31/2016  . Pneumococcal Polysaccharide-23 10/09/2001, 01/13/2014  . Td 10/10/2003    Screening Tests Health Maintenance  Topic Date Due  . TETANUS/TDAP  10/04/2018 (Originally 10/09/2013)  . URINE MICROALBUMIN  11/22/2017  . FOOT EXAM  12/01/2017  . HEMOGLOBIN A1C  12/17/2017  . OPHTHALMOLOGY EXAM  05/15/2018  . COLONOSCOPY  07/20/2018  . MAMMOGRAM  12/21/2018  . INFLUENZA VACCINE  Completed  . DEXA SCAN  Completed  . Hepatitis C Screening  Completed  . PNA vac Low Risk Adult  Completed        Plan:   Please go to lab today.  Continue to eat heart healthy diet (full of fruits, vegetables, whole grains, lean protein, water--limit salt, fat, and sugar intake) and increase physical activity as tolerated.  Continue doing brain stimulating activities (puzzles, reading, adult coloring books, staying active) to keep memory sharp.     I have personally reviewed and noted the following in the patient's chart:   . Medical and social history . Use of alcohol, tobacco or illicit drugs  . Current medications and supplements . Functional ability and status . Nutritional status . Physical activity . Advanced directives . List of other physicians . Hospitalizations, surgeries, and ER visits in previous 12 months . Vitals . Screenings to include cognitive, depression, and falls . Referrals and appointments  In addition, I have reviewed and discussed with patient certain preventive protocols, quality metrics, and best practice recommendations. A written  personalized care plan  for preventive services as well as general preventive health recommendations were provided to patient.     Avon Gully, [place]  11/07/2017

## 2017-11-07 NOTE — Addendum Note (Signed)
Addended by: Varney BilesWIESNER, Felicia Both M on: 11/07/2017 03:15 PM   Modules accepted: Orders

## 2017-11-08 LAB — IGE: IgE (Immunoglobulin E), Serum: 2437 IU/mL — ABNORMAL HIGH (ref 0–100)

## 2017-11-08 NOTE — Progress Notes (Signed)
Medical screening examination/treatment/procedure(s) were performed by the Wellness Coach, RN. As primary care provider I was immediately available for consulation/collaboration. I agree with above documentation. Trenton Passow, AGNP-C 

## 2017-11-13 ENCOUNTER — Telehealth: Payer: Self-pay | Admitting: Internal Medicine

## 2017-11-13 NOTE — Telephone Encounter (Signed)
Pt would like blood work results °

## 2017-11-14 ENCOUNTER — Ambulatory Visit: Payer: Medicare Other | Admitting: Family Medicine

## 2017-11-15 DIAGNOSIS — J301 Allergic rhinitis due to pollen: Secondary | ICD-10-CM | POA: Diagnosis not present

## 2017-11-15 NOTE — Telephone Encounter (Signed)
Patient called and discussed IgE results

## 2017-11-18 ENCOUNTER — Other Ambulatory Visit: Payer: Self-pay | Admitting: Family Medicine

## 2017-11-22 DIAGNOSIS — J301 Allergic rhinitis due to pollen: Secondary | ICD-10-CM | POA: Diagnosis not present

## 2017-11-27 ENCOUNTER — Other Ambulatory Visit: Payer: Self-pay | Admitting: Family Medicine

## 2017-11-30 ENCOUNTER — Other Ambulatory Visit: Payer: Self-pay | Admitting: Nurse Practitioner

## 2017-12-10 ENCOUNTER — Telehealth: Payer: Self-pay | Admitting: Internal Medicine

## 2017-12-10 NOTE — Telephone Encounter (Signed)
Please advise: patient requesting prednisone?  -STOP  Xolair , reassess with Miles CostainFesanra -will NOT resume prednisone -Avoid any allergens-she is constantly around cats and dogs along with carpet I have advised patient that she needs to make choices with her environmental exposures-remove cats and remove carpets It seems that when she cleans her moms house that has heavy carpet, she has reactive airways disease from all dust Continue Advair daily, will ADD Spiriva Continue allergy regiment and allergy shot Avoid sick contacts. Z pak ordered for mild productive cough

## 2017-12-10 NOTE — Telephone Encounter (Signed)
Patient states cough is getting worse  Would like an order called in for prednisone that Dr Belia HemanKasa suggested at last visit

## 2017-12-14 MED ORDER — PREDNISONE 20 MG PO TABS: 20.0000 mg | ORAL_TABLET | Freq: Every day | ORAL | 0 refills | Status: DC

## 2017-12-14 NOTE — Telephone Encounter (Signed)
Called patient to make aware Prednisone sent for #10 of 20 mg.

## 2017-12-14 NOTE — Telephone Encounter (Signed)
Prednisone 20 mg daily for 10 days

## 2017-12-20 ENCOUNTER — Other Ambulatory Visit
Admission: RE | Admit: 2017-12-20 | Discharge: 2017-12-20 | Disposition: A | Discharge status: Home / Self Care | Payer: Medicare Other | Source: Ambulatory Visit | Attending: Nurse Practitioner | Admitting: Nurse Practitioner

## 2017-12-20 DIAGNOSIS — E099 Drug or chemical induced diabetes mellitus without complications: Secondary | ICD-10-CM

## 2017-12-20 DIAGNOSIS — E785 Hyperlipidemia, unspecified: Secondary | ICD-10-CM

## 2017-12-20 DIAGNOSIS — X58XXXA Exposure to other specified factors, initial encounter: Secondary | ICD-10-CM | POA: Insufficient documentation

## 2017-12-20 DIAGNOSIS — E039 Hypothyroidism, unspecified: Secondary | ICD-10-CM | POA: Insufficient documentation

## 2017-12-20 DIAGNOSIS — R609 Edema, unspecified: Secondary | ICD-10-CM | POA: Insufficient documentation

## 2017-12-20 DIAGNOSIS — T380X5A Adverse effect of glucocorticoids and synthetic analogues, initial encounter: Secondary | ICD-10-CM | POA: Diagnosis not present

## 2017-12-20 LAB — CBC WITH DIFFERENTIAL/PLATELET
BASOS ABS: 0.1 10*3/uL (ref 0–0.1)
BASOS PCT: 1 %
EOS ABS: 0.5 10*3/uL (ref 0–0.7)
Eosinophils Relative: 5 %
HEMATOCRIT: 41 % (ref 35.0–47.0)
Hemoglobin: 13.7 g/dL (ref 12.0–16.0)
Lymphocytes Relative: 51 %
Lymphs Abs: 5.4 10*3/uL — ABNORMAL HIGH (ref 1.0–3.6)
MCH: 29.1 pg (ref 26.0–34.0)
MCHC: 33.4 g/dL (ref 32.0–36.0)
MCV: 86.9 fL (ref 80.0–100.0)
MONO ABS: 0.6 10*3/uL (ref 0.2–0.9)
Monocytes Relative: 6 %
NEUTROS ABS: 3.9 10*3/uL (ref 1.4–6.5)
NEUTROS PCT: 37 %
Platelets: 233 10*3/uL (ref 150–440)
RBC: 4.72 MIL/uL (ref 3.80–5.20)
RDW: 14 % (ref 11.5–14.5)
WBC: 10.5 10*3/uL (ref 3.6–11.0)

## 2017-12-20 LAB — COMPREHENSIVE METABOLIC PANEL
ALT: 13 U/L — ABNORMAL LOW (ref 14–54)
ANION GAP: 10 (ref 5–15)
AST: 22 U/L (ref 15–41)
Albumin: 3.6 g/dL (ref 3.5–5.0)
Alkaline Phosphatase: 83 U/L (ref 38–126)
BILIRUBIN TOTAL: 0.6 mg/dL (ref 0.3–1.2)
BUN: 13 mg/dL (ref 6–20)
CALCIUM: 8.9 mg/dL (ref 8.9–10.3)
CO2: 26 mmol/L (ref 22–32)
CREATININE: 1.11 mg/dL — AB (ref 0.44–1.00)
Chloride: 104 mmol/L (ref 101–111)
GFR calc non Af Amer: 49 mL/min — ABNORMAL LOW (ref >60)
GFR, EST AFRICAN AMERICAN: 57 mL/min — AB (ref >60)
GLUCOSE: 87 mg/dL (ref 65–99)
Potassium: 3.3 mmol/L — ABNORMAL LOW (ref 3.5–5.1)
Sodium: 140 mmol/L (ref 135–145)
TOTAL PROTEIN: 7.6 g/dL (ref 6.5–8.1)

## 2017-12-20 LAB — LIPID PANEL
Cholesterol: 193 mg/dL (ref 0–200)
HDL: 72 mg/dL (ref >40)
LDL CALC: 111 mg/dL — AB (ref 0–99)
TRIGLYCERIDES: 52 mg/dL (ref <150)
Total CHOL/HDL Ratio: 2.7 RATIO
VLDL: 10 mg/dL (ref 0–40)

## 2017-12-20 LAB — HEMOGLOBIN A1C
Hgb A1c MFr Bld: 6 % — ABNORMAL HIGH (ref 4.8–5.6)
Mean Plasma Glucose: 125.5 mg/dL

## 2017-12-20 LAB — T4, FREE: Free T4: 0.97 ng/dL (ref 0.61–1.12)

## 2017-12-20 LAB — TSH: TSH: 3.171 u[IU]/mL (ref 0.350–4.500)

## 2017-12-24 ENCOUNTER — Other Ambulatory Visit: Payer: Self-pay | Admitting: Nurse Practitioner

## 2017-12-24 ENCOUNTER — Telehealth: Payer: Self-pay | Admitting: Nurse Practitioner

## 2017-12-24 DIAGNOSIS — R609 Edema, unspecified: Secondary | ICD-10-CM

## 2017-12-24 DIAGNOSIS — E876 Hypokalemia: Secondary | ICD-10-CM

## 2017-12-24 DIAGNOSIS — J301 Allergic rhinitis due to pollen: Secondary | ICD-10-CM | POA: Diagnosis not present

## 2017-12-24 DIAGNOSIS — E099 Drug or chemical induced diabetes mellitus without complications: Secondary | ICD-10-CM

## 2017-12-24 DIAGNOSIS — T380X5A Adverse effect of glucocorticoids and synthetic analogues, initial encounter: Principal | ICD-10-CM

## 2017-12-24 MED ORDER — POTASSIUM CHLORIDE CRYS ER 20 MEQ PO TBCR: 20.0000 meq | EXTENDED_RELEASE_TABLET | Freq: Every day | ORAL | 1 refills | Status: DC

## 2017-12-24 MED ORDER — METFORMIN HCL ER (OSM) 500 MG PO TB24: 500.0000 mg | ORAL_TABLET | Freq: Every day | ORAL | 1 refills | Status: DC

## 2017-12-24 MED ORDER — FUROSEMIDE 20 MG PO TABS: 10.0000 mg | ORAL_TABLET | Freq: Every day | ORAL | 1 refills | Status: DC

## 2017-12-24 NOTE — Telephone Encounter (Signed)
-----   Message from Livingston DionesPhetcharat Noitamyae, LPN sent at 1/[id number]/18/2019  5:03 PM EDT ----- Please send K+

## 2017-12-25 ENCOUNTER — Telehealth: Payer: Self-pay | Admitting: Internal Medicine

## 2017-12-25 NOTE — Telephone Encounter (Signed)
Pt calling asking about Prednisone  She states she was prescribed 20 mg for 10 days  She is going to finish her 10 days tomorrow She is calling to see what she is to do after this, she states last time he had to wean her off this slowly.  Please call back

## 2017-12-25 NOTE — Telephone Encounter (Signed)
No need to wean. Call if breathing gets worse. Patient advised multiple times to leave current living conditions

## 2017-12-25 NOTE — Telephone Encounter (Signed)
LMOVM for pt to return to call.   

## 2017-12-25 NOTE — Telephone Encounter (Signed)
Pt returning our call  ° °

## 2017-12-25 NOTE — Telephone Encounter (Signed)
Pt informed of response. She states that she has just never had Prednisone and not been weaned. She said just an [place].

## 2017-12-25 NOTE — Telephone Encounter (Signed)
Please advise on message below.

## 2017-12-26 ENCOUNTER — Telehealth: Payer: Self-pay | Admitting: Nurse Practitioner

## 2017-12-26 MED ORDER — METFORMIN HCL ER 500 MG PO TB24: 500.0000 mg | ORAL_TABLET | Freq: Every day | ORAL | 1 refills | Status: DC

## 2017-12-26 NOTE — Telephone Encounter (Signed)
Ok to change

## 2017-12-26 NOTE — Telephone Encounter (Signed)
Copied from CRM [phone]. Topic: Quick Communication - Rx Refill/Question >> Dec 26, 2017 12:08 PM Zada GirtLander, Lumin L wrote: Medication: metformin (FORTAMET) 500 MG (OSM) 24 hr tablet not covered by her insurance pharm wants it changed to "Glucophage XR"  Has the patient contacted their pharmacy? Yes.    (Agent: If no, request that the patient contact the pharmacy for the refill.)  Preferred Pharmacy (with phone number or street name): [id number] [place] - [address] [place] [id number] Phone: [phone] Fax: [phone]  Agent: Please be advised that RX refills may take up to 3 business days. We ask that you follow-up with your pharmacy.

## 2017-12-26 NOTE — Telephone Encounter (Signed)
Patient is requesting a different medication- the Launa GrillFortamet is not covered by her insurance. Can it be changed to Glucophage XR.

## 2017-12-26 NOTE — Telephone Encounter (Signed)
New rx sent

## 2017-12-28 DIAGNOSIS — J301 Allergic rhinitis due to pollen: Secondary | ICD-10-CM | POA: Diagnosis not present

## 2018-01-01 NOTE — Telephone Encounter (Signed)
Pt. Called [facility] and reported breaking out into hives a few days after her xolair shot. [facility] d/c'd her Geoffry Paradisexolair it had already been ordered. Per Florentina AddisonKatie give it to [employer] M[facility]yn Ellis dob [birth year], she takes the same dose 375mg  and you won't have to order her's this month. Will put in received PepsiCoxolair smart text. Nothing further needed.

## 2018-01-03 DIAGNOSIS — J301 Allergic rhinitis due to pollen: Secondary | ICD-10-CM | POA: Diagnosis not present

## 2018-01-15 ENCOUNTER — Ambulatory Visit: Payer: Medicare Other | Admitting: Internal Medicine

## 2018-01-16 ENCOUNTER — Telehealth: Payer: Self-pay | Admitting: Nurse Practitioner

## 2018-01-16 DIAGNOSIS — J301 Allergic rhinitis due to pollen: Secondary | ICD-10-CM | POA: Diagnosis not present

## 2018-01-16 MED ORDER — ALBUTEROL SULFATE HFA 108 (90 BASE) MCG/ACT IN AERS: INHALATION_SPRAY | RESPIRATORY_TRACT | 11 refills | Status: DC

## 2018-01-16 NOTE — Telephone Encounter (Signed)
Copied from CRM [phone]. Topic: Quick Communication - Rx Refill/Question >> Jan 16, 2018  1:03 PM Mickel BaasMcGee, Kayde Atkerson B, NT wrote: Medication: VENTOLIN HFA 108 (90 Base) MCG/ACT inhaler  Has the patient contacted their pharmacy? Yes.   (Agent: If no, request that the patient contact the pharmacy for the refill.) Preferred Pharmacy (with phone number or street name): [id number] [place], [place] - [address] Agent: Please be advised that RX refills may take up to 3 business days. We ask that you follow-up with your pharmacy.

## 2018-01-18 DIAGNOSIS — J301 Allergic rhinitis due to pollen: Secondary | ICD-10-CM | POA: Diagnosis not present

## 2018-01-21 DIAGNOSIS — J301 Allergic rhinitis due to pollen: Secondary | ICD-10-CM | POA: Diagnosis not present

## 2018-01-24 ENCOUNTER — Other Ambulatory Visit: Payer: Self-pay | Admitting: Nurse Practitioner

## 2018-01-31 ENCOUNTER — Ambulatory Visit (INDEPENDENT_AMBULATORY_CARE_PROVIDER_SITE_OTHER): Payer: Medicare Other | Admitting: Nurse Practitioner

## 2018-01-31 ENCOUNTER — Encounter: Payer: Self-pay | Admitting: Nurse Practitioner

## 2018-01-31 VITALS — BP 150/70 | HR 99 | Temp 98.0°F | Ht 67.0 in | Wt 258.8 lb

## 2018-01-31 DIAGNOSIS — M79604 Pain in right leg: Secondary | ICD-10-CM

## 2018-01-31 DIAGNOSIS — M79605 Pain in left leg: Secondary | ICD-10-CM | POA: Diagnosis not present

## 2018-01-31 DIAGNOSIS — R600 Localized edema: Secondary | ICD-10-CM

## 2018-01-31 MED ORDER — TRAMADOL HCL 50 MG PO TABS: 25.0000 mg | ORAL_TABLET | Freq: Two times a day (BID) | ORAL | 0 refills | Status: DC | PRN

## 2018-01-31 NOTE — Patient Instructions (Addendum)
Continue compression stocking use  Venous doppler negative for DVT.  Alternate between tramadol or  meloxicam or tylenol for pain.

## 2018-01-31 NOTE — Progress Notes (Signed)
Subjective:  Patient ID: Rachael Silva, female    DOB: [birth year]  Age: 72 y.o. MRN: [id number]  CC: Leg Swelling (5 days ago right leg started to swell with pain. Left leg started yesterday.)  HPI  Rachael Silva presents with worsening LE edema, pain and redness x5days. Sudden onset, no improvement with use of compression stocking or elevation. Denies any recent travel or immobilization. No LE injury. No claudication. No fever. No chest pain, has chronic SOB with exertion due to asthma (no change).  Outpatient Medications Prior to Visit  Medication Sig Dispense Refill  . albuterol (ACCUNEB) 0.63 MG/3ML nebulizer solution Take 1 ampule by nebulization every 6 (six) hours as needed.      Marland Kitchen. albuterol (VENTOLIN HFA) 108 (90 Base) MCG/ACT inhaler TAKE 1 TO 2 PUFFS EVERY 6 HOURS AS NEEDED 18 g 11  . B Complex-C-Folic Acid (STRESS B COMPLEX PO) Take 1 tablet by mouth daily.    . Calcium Carbonate-Vitamin D (CALTRATE 600+D PO) Take 1 tablet by mouth 2 (two) times daily.    Marland Kitchen. co-enzyme Q-10 30 MG capsule Take 200 mg by mouth daily.    Marland Kitchen. EPIPEN 2-PAK 0.3 MG/0.3ML SOAJ injection See admin instructions.  99  . fexofenadine (ALLEGRA ALLERGY) 60 MG tablet Take 60 mg by mouth daily.     . Fluticasone-Salmeterol (ADVAIR DISKUS) 250-50 MCG/DOSE AEPB Inhale 1 puff into the lungs 2 (two) times daily.     . furosemide (LASIX) 20 MG tablet Take 0.5 tablets (10 mg total) by mouth daily. 30 tablet 1  . GARLIC PO Take 1 capsule by mouth daily.    Marland Kitchen. levothyroxine (SYNTHROID, LEVOTHROID) 50 MCG tablet TAKE 1 TABLET BY MOUTH EVERY DAY 30 tablet 2  . meloxicam (MOBIC) 7.5 MG tablet Take 1 tablet (7.5 mg total) by mouth daily. With food 30 tablet 0  . metFORMIN (GLUCOPHAGE XR) 500 MG 24 hr tablet Take 1 tablet (500 mg total) by mouth daily with breakfast. 90 tablet 1  . montelukast (SINGULAIR) 10 MG tablet TAKE 1 TABLET BY MOUTH EVERY DAY 90 tablet 1  . Multiple Vitamins-Minerals (CENTRUM ADULTS PO) Take 1 tablet by  mouth daily.    Marland Kitchen. omeprazole (PRILOSEC) 40 MG capsule Take 1 capsule (40 mg total) by mouth daily. (Patient taking differently: Take 20 mg by mouth daily. ) 30 capsule 5  . ONE TOUCH ULTRA TEST test strip USE TO TEST TWICE DAILY DX E09.9 100 each 0  . ONETOUCH DELICA LANCETS 33G MISC USE TO TEST SUGARS TWICE DAILY DX E09.9 100 each 0  . potassium chloride SA (K-DUR,KLOR-CON) 20 MEQ tablet Take 1 tablet (20 mEq total) by mouth daily. 30 tablet 1  . Tiotropium Bromide Monohydrate (SPIRIVA RESPIMAT) 1.25 MCG/ACT AERS Inhale 2 puffs into the lungs 2 (two) times daily. 1 Inhaler 5  . triamcinolone ointment (KENALOG) 0.1 % Apply 1 application topically 2 (two) times daily. 80 g 0  . naproxen (NAPROSYN) 500 MG tablet TAKE 1 TABLET (500 MG TOTAL) BY MOUTH 2 (TWO) TIMES DAILY AS NEEDED (FOR PAIN, TAKE WITH FOOD).  0  . predniSONE (DELTASONE) 10 MG tablet Take 20 mg by mouth daily.  0  . predniSONE (DELTASONE) 20 MG tablet Take 1 tablet (20 mg total) by mouth daily. 10 tablet 0  . diphenhydrAMINE (BENADRYL ALLERGY) 25 MG tablet Take 1 tablet (25 mg total) by mouth every 8 (eight) hours for 1 day, THEN 1 tablet (25 mg total) every 12 (twelve) hours for 2 days, THEN  1 tablet (25 mg total) every 8 (eight) hours as needed for up to 2 days for itching. 30 tablet 0  . azithromycin (ZITHROMAX Z-PAK) 250 MG tablet Take 2 tablets on Day 1 and then 1 tablet daily till gone. (Patient not taking: Reported on 01/31/2018) 6 each 0   Facility-Administered Medications Prior to Visit  Medication Dose Route Frequency Provider Last Rate Last Dose  . omalizumab Geoffry Paradise) injection 375 mg  375 mg Subcutaneous Q14 Days Erin Fulling, MD   375 mg at 08/16/17 1249  . omalizumab Geoffry Paradise) injection 375 mg  375 mg Subcutaneous Q14 Days Erin Fulling, MD   375 mg at 09/25/17 1638  . omalizumab Geoffry Paradise) injection 375 mg  375 mg Subcutaneous Q14 Days Erin Fulling, MD   375 mg at 10/11/17 1625    ROS See HPI  Objective:  BP (!) 150/70  (BP Location: Left Arm, Patient Position: Sitting, Cuff Size: Normal)   Pulse 99   Temp 98 F (36.7 C) (Oral)   Ht 5\' 7"  (1.702 m)   Wt 258 lb 12.8 oz (117.4 kg)   SpO2 94%   BMI 40.53 kg/m   BP Readings from Last 3 Encounters:  01/31/18 (!) 150/70  11/07/17 140/68  11/06/17 (!) 150/82    Wt Readings from Last 3 Encounters:  01/31/18 258 lb 12.8 oz (117.4 kg)  11/07/17 278 lb 9.6 oz (126.4 kg)  11/06/17 279 lb (126.6 kg)    Physical Exam  Constitutional: She is oriented to person, place, and time. No distress.  Cardiovascular: Normal rate, regular rhythm, normal heart sounds and intact distal pulses.  Pulmonary/Chest: Effort normal and breath sounds normal.  Musculoskeletal: She exhibits edema and tenderness. She exhibits no deformity.  Neurological: She is alert and oriented to person, place, and time.  Skin: There is erythema.     Psychiatric: She has a normal mood and affect. Rachael Silva behavior is normal.  Vitals reviewed.   Lab Results  Component Value Date   WBC 10.5 12/20/2017   HGB 13.7 12/20/2017   HCT 41.0 12/20/2017   PLT 233 12/20/2017   GLUCOSE 87 12/20/2017   CHOL 193 12/20/2017   TRIG 52 12/20/2017   HDL 72 12/20/2017   LDLDIRECT 138.9 04/07/2011   LDLCALC 111 (H) 12/20/2017   ALT 13 (L) 12/20/2017   AST 22 12/20/2017   NA 140 12/20/2017   K 3.3 (L) 12/20/2017   CL 104 12/20/2017   CREATININE 1.11 (H) 12/20/2017   BUN 13 12/20/2017   CO2 26 12/20/2017   TSH 3.171 12/20/2017   HGBA1C 6.0 (H) 12/20/2017   MICROALBUR <0.7 11/22/2016    Assessment & Plan:   Rachael Silva was seen today for leg swelling.  Diagnoses and all orders for this visit:  Leg pain, bilateral -     VAS [place] LOWER EXTREMITY VENOUS (DVT); Future -     traMADol (ULTRAM) 50 MG tablet; Take 0.5-1 tablets (25-50 mg total) by mouth every 12 (twelve) hours as needed. -     cephALEXin (KEFLEX) 500 MG capsule; Take 1 capsule (500 mg total) by mouth 3 (three) times daily.  Bilateral leg  edema -     VAS [place] LOWER EXTREMITY VENOUS (DVT); Future -     cephALEXin (KEFLEX) 500 MG capsule; Take 1 capsule (500 mg total) by mouth 3 (three) times daily.   I have discontinued Rachael Silva's azithromycin, naproxen, predniSONE, and predniSONE. I am also having Rachael Silva start on traMADol and cephALEXin. Additionally, I am having  Rachael Silva maintain Rachael Silva albuterol, Fluticasone-Salmeterol, fexofenadine, Multiple Vitamins-Minerals (CENTRUM ADULTS PO), Calcium Carbonate-Vitamin D (CALTRATE 600+D PO), co-enzyme Q-10, B Complex-C-Folic Acid (STRESS B COMPLEX PO), GARLIC PO, omeprazole, EPIPEN 2-PAK, ONETOUCH DELICA LANCETS 33G, ONE TOUCH ULTRA TEST, levothyroxine, triamcinolone ointment, diphenhydrAMINE, meloxicam, Tiotropium Bromide Monohydrate, montelukast, potassium chloride SA, metFORMIN, albuterol, and furosemide. We will continue to administer omalizumab, omalizumab, and omalizumab.  Meds ordered this encounter  Medications  . traMADol (ULTRAM) 50 MG tablet    Sig: Take 0.5-1 tablets (25-50 mg total) by mouth every 12 (twelve) hours as needed.    Dispense:  6 tablet    Refill:  0    Order Specific Question:   Supervising Provider    Answer:   MATTHEWS, CODY [[id number]]  . cephALEXin (KEFLEX) 500 MG capsule    Sig: Take 1 capsule (500 mg total) by mouth 3 (three) times daily.    Dispense:  21 capsule    Refill:  0    Order Specific Question:   Supervising Provider    Answer:   MATTHEWS, CODY [[id number]]    Follow-up: Return if symptoms worsen or fail to improve.  Alysia Penna, NP

## 2018-02-01 ENCOUNTER — Ambulatory Visit (HOSPITAL_COMMUNITY)
Admission: RE | Admit: 2018-02-01 | Discharge: 2018-02-01 | Disposition: A | Discharge status: Home / Self Care | Payer: Medicare Other | Source: Ambulatory Visit | Attending: Nurse Practitioner | Admitting: Nurse Practitioner

## 2018-02-01 ENCOUNTER — Telehealth: Payer: Self-pay

## 2018-02-01 ENCOUNTER — Telehealth: Payer: Self-pay | Admitting: Nurse Practitioner

## 2018-02-01 ENCOUNTER — Encounter: Payer: Self-pay | Admitting: Nurse Practitioner

## 2018-02-01 DIAGNOSIS — M79604 Pain in right leg: Secondary | ICD-10-CM | POA: Insufficient documentation

## 2018-02-01 DIAGNOSIS — R600 Localized edema: Secondary | ICD-10-CM | POA: Diagnosis not present

## 2018-02-01 DIAGNOSIS — M79605 Pain in left leg: Secondary | ICD-10-CM | POA: Diagnosis not present

## 2018-02-01 MED ORDER — CEPHALEXIN 500 MG PO CAPS: 500.0000 mg | ORAL_CAPSULE | Freq: Three times a day (TID) | ORAL | 0 refills | Status: DC

## 2018-02-01 NOTE — Telephone Encounter (Signed)
Spoke with pt daughter who was there assisting pt, informed her that the order for venous doppler was placed yesterday and we do not know when they will call or where they will be sending her. Also informed her of the ABX sent into pharmacy for her/ TLG

## 2018-02-01 NOTE — Telephone Encounter (Signed)
Copied from CRM [phone]. Topic: Referral - Status >> Feb 01, 2018  8:23 AM Waymon AmatoBurton, Donna F wrote: Pt is checking on status of referral to have a scan for a possible DVT    Best number for daughter [phone]

## 2018-02-01 NOTE — Progress Notes (Signed)
LE venous duplex prelim: negative for DVT. Farrel DemarkJill Eunice, RDMS, RVT Called results to [place].

## 2018-02-01 NOTE — Telephone Encounter (Signed)
See other phone note. Spoke with daughter and informed of Rx and addressed daughters questions. TLG

## 2018-02-12 ENCOUNTER — Telehealth: Payer: Self-pay | Admitting: Nurse Practitioner

## 2018-02-12 DIAGNOSIS — J45909 Unspecified asthma, uncomplicated: Secondary | ICD-10-CM

## 2018-02-12 MED ORDER — ALBUTEROL SULFATE 0.63 MG/3ML IN NEBU: 1.0000 | INHALATION_SOLUTION | Freq: Four times a day (QID) | RESPIRATORY_TRACT | 0 refills | Status: DC | PRN

## 2018-02-12 NOTE — Telephone Encounter (Signed)
Copied from CRM [id number]. Topic: Quick Communication - Rx Refill/Question >> Feb 12, 2018  2:22 PM Landry Mellow wrote: Medication: albuterol (ACCUNEB) 0.63 MG/3ML nebulizer solution Has the patient contacted their pharmacy? Yes.   (Agent: If no, request that the patient contact the pharmacy for the refill.) Preferred Pharmacy (with phone number or street name): [facility] [address] [place]  Agent: Please be advised that RX refills may take up to 3 business days. We ask that you follow-up with your pharmacy.

## 2018-02-14 ENCOUNTER — Ambulatory Visit (INDEPENDENT_AMBULATORY_CARE_PROVIDER_SITE_OTHER): Payer: Medicare Other | Admitting: Family Medicine

## 2018-02-14 ENCOUNTER — Inpatient Hospital Stay
Admission: EM | Admit: 2018-02-14 | Discharge: 2018-02-16 | DRG: 603 | Disposition: A | Discharge status: Home / Self Care | Payer: Medicare Other | Attending: Internal Medicine | Admitting: Internal Medicine

## 2018-02-14 ENCOUNTER — Encounter: Payer: Self-pay | Admitting: Family Medicine

## 2018-02-14 ENCOUNTER — Encounter: Payer: Self-pay | Admitting: *Deleted

## 2018-02-14 ENCOUNTER — Telehealth: Payer: Self-pay | Admitting: Family Medicine

## 2018-02-14 ENCOUNTER — Ambulatory Visit: Payer: Self-pay | Admitting: *Deleted

## 2018-02-14 ENCOUNTER — Other Ambulatory Visit: Payer: Self-pay

## 2018-02-14 ENCOUNTER — Telehealth: Payer: Self-pay

## 2018-02-14 ENCOUNTER — Emergency Department: Payer: Medicare Other

## 2018-02-14 VITALS — BP 150/80 | HR 107 | Temp 97.8°F

## 2018-02-14 DIAGNOSIS — G4733 Obstructive sleep apnea (adult) (pediatric): Secondary | ICD-10-CM | POA: Diagnosis present

## 2018-02-14 DIAGNOSIS — Z888 Allergy status to other drugs, medicaments and biological substances status: Secondary | ICD-10-CM | POA: Diagnosis not present

## 2018-02-14 DIAGNOSIS — E669 Obesity, unspecified: Secondary | ICD-10-CM | POA: Diagnosis not present

## 2018-02-14 DIAGNOSIS — Z881 Allergy status to other antibiotic agents status: Secondary | ICD-10-CM

## 2018-02-14 DIAGNOSIS — M79641 Pain in right hand: Secondary | ICD-10-CM

## 2018-02-14 DIAGNOSIS — J4540 Moderate persistent asthma, uncomplicated: Secondary | ICD-10-CM | POA: Diagnosis present

## 2018-02-14 DIAGNOSIS — Z79899 Other long term (current) drug therapy: Secondary | ICD-10-CM | POA: Diagnosis not present

## 2018-02-14 DIAGNOSIS — M170 Bilateral primary osteoarthritis of knee: Secondary | ICD-10-CM | POA: Diagnosis present

## 2018-02-14 DIAGNOSIS — Z7952 Long term (current) use of systemic steroids: Secondary | ICD-10-CM | POA: Diagnosis not present

## 2018-02-14 DIAGNOSIS — I248 Other forms of acute ischemic heart disease: Secondary | ICD-10-CM | POA: Diagnosis present

## 2018-02-14 DIAGNOSIS — L03011 Cellulitis of right finger: Secondary | ICD-10-CM | POA: Diagnosis present

## 2018-02-14 DIAGNOSIS — I10 Essential (primary) hypertension: Secondary | ICD-10-CM | POA: Diagnosis present

## 2018-02-14 DIAGNOSIS — R748 Abnormal levels of other serum enzymes: Secondary | ICD-10-CM | POA: Diagnosis not present

## 2018-02-14 DIAGNOSIS — M2540 Effusion, unspecified joint: Secondary | ICD-10-CM

## 2018-02-14 DIAGNOSIS — J82 Pulmonary eosinophilia, not elsewhere classified: Secondary | ICD-10-CM | POA: Diagnosis not present

## 2018-02-14 DIAGNOSIS — D721 Eosinophilia, unspecified: Secondary | ICD-10-CM

## 2018-02-14 DIAGNOSIS — L03113 Cellulitis of right upper limb: Secondary | ICD-10-CM

## 2018-02-14 DIAGNOSIS — I214 Non-ST elevation (NSTEMI) myocardial infarction: Secondary | ICD-10-CM | POA: Diagnosis not present

## 2018-02-14 DIAGNOSIS — E039 Hypothyroidism, unspecified: Secondary | ICD-10-CM | POA: Diagnosis not present

## 2018-02-14 DIAGNOSIS — M7989 Other specified soft tissue disorders: Secondary | ICD-10-CM

## 2018-02-14 DIAGNOSIS — R600 Localized edema: Secondary | ICD-10-CM | POA: Diagnosis not present

## 2018-02-14 HISTORY — DX: Cellulitis of right finger: L03.011

## 2018-02-14 HISTORY — DX: Other specified soft tissue disorders: M79.89

## 2018-02-14 LAB — DIFFERENTIAL
BAND NEUTROPHILS: 0 %
BAND NEUTROPHILS: 0 %
BASOS ABS: 0 10*3/uL (ref 0–0.1)
BASOS ABS: 0.1 10*3/uL (ref 0–0.1)
BASOS PCT: 0 %
Basophils Relative: 0 %
Blasts: 0 %
Blasts: 0 %
EOS ABS: 13.1 10*3/uL — AB (ref 0–0.7)
Eosinophils Absolute: 12.8 10*3/uL — ABNORMAL HIGH (ref 0–0.7)
Eosinophils Relative: 54 %
Eosinophils Relative: 54 %
LYMPHS ABS: 2 10*3/uL (ref 1.0–3.6)
LYMPHS PCT: 12 %
Lymphocytes Relative: 12 %
Lymphs Abs: 2.9 10*3/uL (ref 1.0–3.6)
MONO ABS: 0.4 10*3/uL (ref 0.2–0.9)
MONOS PCT: 4 %
MONOS PCT: 4 %
Metamyelocytes Relative: 0 %
Metamyelocytes Relative: 0 %
Monocytes Absolute: 1 10*3/uL — ABNORMAL HIGH (ref 0.2–0.9)
Myelocytes: 0 %
Myelocytes: 0 %
NEUTROS ABS: 6.5 10*3/uL (ref 1.4–6.5)
NRBC: 0 /100{WBCs}
Neutro Abs: 7.1 10*3/uL — ABNORMAL HIGH (ref 1.4–6.5)
Neutrophils Relative %: 30 %
Neutrophils Relative %: 30 %
OTHER: 0 %
Other: 0 %
PROMYELOCYTES RELATIVE: 0 %
Promyelocytes Relative: 0 %
nRBC: 0 /100 WBC

## 2018-02-14 LAB — CBC WITH DIFFERENTIAL/PLATELET
BASOS PCT: 0.5 % (ref 0.0–3.0)
Basophils Absolute: 0.1 10*3/uL (ref 0.0–0.1)
EOS ABS: 13.9 10*3/uL — AB (ref 0.0–0.7)
EOS PCT: 63.1 % — AB (ref 0.0–5.0)
HCT: 36.6 % (ref 36.0–46.0)
HCT: 36.5 % (ref 35.0–47.0)
HEMOGLOBIN: 12.2 g/dL (ref 12.0–15.0)
Hemoglobin: 12.2 g/dL (ref 12.0–16.0)
LYMPHS ABS: 2 10*3/uL (ref 0.7–4.0)
Lymphocytes Relative: 9 % — ABNORMAL LOW (ref 12.0–46.0)
MCH: 28.7 pg (ref 26.0–34.0)
MCHC: 33.3 g/dL (ref 30.0–36.0)
MCHC: 33.3 g/dL (ref 32.0–36.0)
MCV: 85.5 fl (ref 78.0–100.0)
MCV: 86.2 fL (ref 80.0–100.0)
MONOS PCT: 2.5 % — AB (ref 3.0–12.0)
Monocytes Absolute: 0.6 10*3/uL (ref 0.1–1.0)
NEUTROS ABS: 5.5 10*3/uL (ref 1.4–7.7)
NEUTROS PCT: 24.9 % — AB (ref 43.0–77.0)
PLATELETS: 304 10*3/uL (ref 150–440)
Platelets: 324 10*3/uL (ref 150.0–400.0)
RBC: 4.28 Mil/uL (ref 3.87–5.11)
RBC: 4.24 MIL/uL (ref 3.80–5.20)
RDW: 14.5 % (ref 11.5–15.5)
RDW: 15 % — AB (ref 11.5–14.5)
WBC: 22 10*3/uL (ref 4.0–10.5)
WBC: 23.9 10*3/uL — ABNORMAL HIGH (ref 3.6–11.0)

## 2018-02-14 LAB — BASIC METABOLIC PANEL
Anion gap: 10 (ref 5–15)
BUN: 14 mg/dL (ref 6–20)
CHLORIDE: 101 mmol/L (ref 101–111)
CO2: 24 mmol/L (ref 22–32)
CREATININE: 0.8 mg/dL (ref 0.44–1.00)
Calcium: 8.5 mg/dL — ABNORMAL LOW (ref 8.9–10.3)
Glucose, Bld: 104 mg/dL — ABNORMAL HIGH (ref 65–99)
POTASSIUM: 3.6 mmol/L (ref 3.5–5.1)
SODIUM: 135 mmol/L (ref 135–145)

## 2018-02-14 LAB — CBC
HCT: 36.4 % (ref 35.0–47.0)
Hemoglobin: 12.1 g/dL (ref 12.0–16.0)
MCH: 28.7 pg (ref 26.0–34.0)
MCHC: 33.3 g/dL (ref 32.0–36.0)
MCV: 86.2 fL (ref 80.0–100.0)
PLATELETS: 334 10*3/uL (ref 150–440)
RBC: 4.22 MIL/uL (ref 3.80–5.20)
RDW: 14.5 % (ref 11.5–14.5)
WBC: 23.8 10*3/uL — AB (ref 3.6–11.0)

## 2018-02-14 LAB — TROPONIN I: Troponin I: 0.05 ng/mL (ref <0.03)

## 2018-02-14 LAB — URIC ACID: Uric Acid, Serum: 3.8 mg/dL (ref 2.4–7.0)

## 2018-02-14 LAB — SEDIMENTATION RATE: Sed Rate: 6 mm/hr (ref 0–30)

## 2018-02-14 MED ORDER — PIPERACILLIN-TAZOBACTAM 3.375 G IVPB 30 MIN
3.3750 g | Freq: Once | INTRAVENOUS | Status: AC
  Administered 2018-02-14: 3.375 g via INTRAVENOUS
  Filled 2018-02-14: qty 50

## 2018-02-14 MED ORDER — VANCOMYCIN HCL 10 G IV SOLR
1250.0000 mg | Freq: Two times a day (BID) | INTRAVENOUS | Status: DC
  Administered 2018-02-15 – 2018-02-16 (×3): 1250 mg via INTRAVENOUS
  Filled 2018-02-14 (×4): qty 1250

## 2018-02-14 MED ORDER — INDOMETHACIN 50 MG PO CAPS: 50.0000 mg | ORAL_CAPSULE | Freq: Two times a day (BID) | ORAL | 0 refills | Status: DC

## 2018-02-14 MED ORDER — TRAMADOL HCL 50 MG PO TABS
50.0000 mg | ORAL_TABLET | ORAL | Status: AC
  Administered 2018-02-14: 50 mg via ORAL
  Filled 2018-02-14: qty 1

## 2018-02-14 MED ORDER — PIPERACILLIN-TAZOBACTAM 3.375 G IVPB
3.3750 g | Freq: Three times a day (TID) | INTRAVENOUS | Status: DC
  Administered 2018-02-15 – 2018-02-16 (×4): 3.375 g via INTRAVENOUS
  Filled 2018-02-14 (×5): qty 50

## 2018-02-14 MED ORDER — VANCOMYCIN HCL IN DEXTROSE 1-5 GM/200ML-% IV SOLN
1000.0000 mg | INTRAVENOUS | Status: AC
  Administered 2018-02-14: 1000 mg via INTRAVENOUS
  Filled 2018-02-14: qty 200

## 2018-02-14 MED ORDER — CEPHALEXIN 500 MG PO CAPS: 500.0000 mg | ORAL_CAPSULE | Freq: Three times a day (TID) | ORAL | 0 refills | Status: DC

## 2018-02-14 NOTE — ED Triage Notes (Signed)
Pt to triage via wheelchair. Pt reports swelling to right hand since last night.pt has right hand pain.  No known injury Pt saw doctor today and has abnormal labs.  Pt sent to er for eval.  Pt alert speech clear.

## 2018-02-14 NOTE — ED Provider Notes (Signed)
[facility] Emergency Department Provider Note   ____________________________________________   First MD Initiated Contact with Patient 02/14/18 1921     (approximate)  I have reviewed the triage vital signs and the nursing notes.   HISTORY  Chief Complaint Abnormal Lab    HPI Rachael Silva is a 72 y.o. female here for evaluation of right hand pain and swelling.  For about 2 to 3 days she is experienced swelling and increasing pain and increasing swelling at the bottom of her right thumb and also into her right second and third fingers.  No nausea vomiting.  No fevers or chills.  No chest pain or trouble breathing.  She reports symptoms seem to be slowly worsening, the pain is very severe and involves the right second and third knuckle in the bottom of the right thumb.  She also recently was treated successfully for possible cellulitis in her feet about 2 weeks ago with Keflex.  Saw her primary care doctor today and they drew labs, saw her white blood cell count was very high and recommend she come to the ER.  Of note I have reviewed those labs, she has an eosinophilia, notable leukocytosis, and normal uric acid level.  Past Medical History:  Diagnosis Date  . Allergy    allergic rhinitis  . Angioedema 02/2006  . Arthritis    OA of knees  . Asthma   . Complication of anesthesia    apnea- sleep  . Frequent headaches   . Hiatal hernia   . Hypertension   . Hypothyroid   . Labile blood pressure   . Lower extremity edema   . Lung disease   . Pneumonia   . Pneumonia, eosinophilic (HCC)   . Shortness of breath dyspnea   . Sleep apnea   . Urinary incontinence   . Wheezing     Patient Active Problem List   Diagnosis Date Noted  . Swelling of right hand 02/14/2018  . Dizziness 02/26/2017  . Right shoulder pain 02/26/2017  . Hyperlipidemia, mild 12/03/2016  . Night sweats 12/01/2016  . Encounter for screening mammogram for breast cancer  05/17/2016  . Exposure to communicable disease 05/17/2016  . OSA on CPAP 05/11/2016  . Syncopal episodes 05/11/2016  . Chronic cough   . EP (eosinophilic pneumonia) (HCC)   . Memory impairment 05/11/2015  . Excessive daytime sleepiness 05/11/2015  . Snoring 05/05/2015  . Morbid obesity (HCC) 05/05/2015  . Steroid-induced diabetes mellitus (HCC) 11/11/2014  . Glucocorticoids and synthetic analogues causing adverse effect in therapeutic use 08/27/2014  . Long term current use of systemic steroids 08/27/2014  . Estrogen deficiency 08/27/2014  . ABPA (allergic bronchopulmonary aspergillosis) (HCC) 07/08/2014  . Cough 06/18/2014  . Asthma, chronic 06/18/2014  . Productive cough 05/27/2014  . Bursitis, shoulder 12/09/2013  . Abnormal mammogram with microcalcification 06/06/2011  . Other screening mammogram 04/17/2011  . Routine general medical examination at a health care facility 04/17/2011  . Hypothyroidism 06/01/2009  . EDEMA 03/03/2009  . SINUSITIS, CHRONIC 08/18/2008  . Arthritis 04/15/2008  . Weight gain due to medication 02/18/2008  . HOT FLASHES 02/18/2008  . GERD (gastroesophageal reflux disease) 08/01/2007  . HYPERCHOLESTEROLEMIA 05/15/2007  . ALLERGIC RHINITIS 05/15/2007  . ASTHMA 05/15/2007  . PERIODONTAL DISEASE 05/15/2007  . MURMUR 05/15/2007  . URINARY INCONTINENCE 05/15/2007    Past Surgical History:  Procedure Laterality Date  . BREAST BIOPSY Left 2013   NEG  . BREAST SURGERY  1966   breast biopsy  .  CESAREAN SECTION     x2  . FLEXIBLE BRONCHOSCOPY N/A 09/16/2015   Procedure: FLEXIBLE BRONCHOSCOPY WITH FLURO ;  Surgeon: Stephanie Acre, MD;  Location: [facility] ORS;  Service: Pulmonary;  Laterality: N/A;    Prior to Admission medications   Medication Sig Start Date End Date Taking? Authorizing Provider  albuterol (ACCUNEB) 0.63 MG/3ML nebulizer solution Take 3 mLs (0.63 mg total) by nebulization every 6 (six) hours as needed for wheezing or shortness of breath.  02/12/18  Yes Nche, Bonna Gains, NP  albuterol (VENTOLIN HFA) 108 (90 Base) MCG/ACT inhaler TAKE 1 TO 2 PUFFS EVERY 6 HOURS AS NEEDED 01/16/18  Yes Nche, Bonna Gains, NP  B Complex-C-Folic Acid (STRESS B COMPLEX PO) Take 1 tablet by mouth daily.   Yes [provider]  Calcium Carbonate-Vitamin D (CALTRATE 600+D PO) Take 1 tablet by mouth 2 (two) times daily.   Yes [provider]  co-enzyme Q-10 30 MG capsule Take 200 mg by mouth daily.   Yes [provider]  diphenhydrAMINE (BENADRYL ALLERGY) 25 MG tablet Take 1 tablet (25 mg total) by mouth every 8 (eight) hours for 1 day, THEN 1 tablet (25 mg total) every 12 (twelve) hours for 2 days, THEN 1 tablet (25 mg total) every 8 (eight) hours as needed for up to 2 days for itching. 10/19/17 02/14/18 Yes Nche, Bonna Gains, NP  EPIPEN 2-PAK 0.3 MG/0.3ML SOAJ injection See admin instructions. 09/24/15  Yes [provider]  fexofenadine (ALLEGRA ALLERGY) 60 MG tablet Take 60 mg by mouth daily.    Yes [provider]  Fluticasone-Salmeterol (ADVAIR DISKUS) 250-50 MCG/DOSE AEPB Inhale 1 puff into the lungs 2 (two) times daily.    Yes [provider]  furosemide (LASIX) 20 MG tablet Take 0.5 tablets (10 mg total) by mouth daily. 01/24/18  Yes Nche, Bonna Gains, NP  GARLIC PO Take 1 capsule by mouth daily.   Yes [provider]  levothyroxine (SYNTHROID, LEVOTHROID) 50 MCG tablet TAKE 1 TABLET BY MOUTH EVERY DAY 08/29/17  Yes Tower, Marne A, MD  montelukast (SINGULAIR) 10 MG tablet TAKE 1 TABLET BY MOUTH EVERY DAY 11/30/17  Yes Nche, Bonna Gains, NP  Multiple Vitamins-Minerals (CENTRUM ADULTS PO) Take 1 tablet by mouth daily.   Yes [provider]  omeprazole (PRILOSEC) 40 MG capsule Take 1 capsule (40 mg total) by mouth daily. Patient taking differently: Take 20 mg by mouth daily.  06/07/15  Yes Mungal, Eston Esters, MD  ONE TOUCH ULTRA TEST test strip USE TO TEST TWICE DAILY DX E09.9 06/18/17  Yes  Tower, Audrie Gallus, MD  ONETOUCH DELICA LANCETS 33G MISC USE TO TEST SUGARS TWICE DAILY DX E09.9 04/26/17  Yes Tower, Marne A, MD  triamcinolone ointment (KENALOG) 0.1 % Apply 1 application topically 2 (two) times daily. 10/19/17  Yes Nche, Bonna Gains, NP  cephALEXin (KEFLEX) 500 MG capsule Take 1 capsule (500 mg total) by mouth 3 (three) times daily. Patient not taking: Reported on 02/14/2018 02/14/18   Everrett Coombe, DO  indomethacin (INDOCIN) 50 MG capsule Take 1 capsule (50 mg total) by mouth 2 (two) times daily with a meal. Patient not taking: Reported on 02/14/2018 02/14/18   Everrett Coombe, DO  metFORMIN (GLUCOPHAGE XR) 500 MG 24 hr tablet Take 1 tablet (500 mg total) by mouth daily with breakfast. Patient not taking: Reported on 02/14/2018 12/26/17   Nche, Bonna Gains, NP  potassium chloride SA (K-DUR,KLOR-CON) 20 MEQ tablet Take 1 tablet (20 mEq total) by mouth daily.  Patient not taking: Reported on 02/14/2018 12/24/17   Nche, Bonna Gains, NP  Tiotropium Bromide Monohydrate (SPIRIVA RESPIMAT) 1.25 MCG/ACT AERS Inhale 2 puffs into the lungs 2 (two) times daily. Patient not taking: Reported on 02/14/2018 11/06/17   Erin Fulling, MD  traMADol (ULTRAM) 50 MG tablet Take 0.5-1 tablets (25-50 mg total) by mouth every 12 (twelve) hours as needed. Patient not taking: Reported on 02/14/2018 01/31/18   Nche, Bonna Gains, NP    Allergies Atorvastatin and Erythromycin  Family History  Problem Relation Age of Onset  . Diabetes Mother        pre-diabetic  . Heart disease Mother        ? heart disease  . Emphysema Mother   . Thyroid disease Mother   . Diabetes Maternal Uncle   . Breast cancer Neg Hx     Social History Social History   Tobacco Use  . Smoking status: Never Smoker  . Smokeless tobacco: Never Used  Substance Use Topics  . Alcohol use: No    Alcohol/week: 0.0 oz  . Drug use: No    Review of Systems Constitutional: No fever/chills Eyes: No visual changes. ENT: No sore  throat. Cardiovascular: Denies chest pain. Respiratory: Denies shortness of breath. Gastrointestinal: No abdominal pain.  No nausea, no vomiting.  No diarrhea.  No constipation. Genitourinary: Negative for dysuria. Musculoskeletal: Negative for back pain.  See HPI. Skin: See HPI.  No other joint pain or rashes.  Recent rash involving her feet and ankles about 2 weeks ago cleared up after Keflex treatment. Neurological: Negative for headaches, focal weakness or numbness.  She denies any injury.  She has not had any cuts or lacerations.  No fall.  ____________________________________________   PHYSICAL EXAM:  VITAL SIGNS: ED Triage Vitals  Enc Vitals Group     BP 02/14/18 1817 125/75     Pulse Rate 02/14/18 1817 98     Resp 02/14/18 1817 20     Temp 02/14/18 1817 98.4 F (36.9 C)     Temp Source 02/14/18 1817 Oral     SpO2 02/14/18 1817 98 %     Weight 02/14/18 1818 258 lb (117 kg)     Height 02/14/18 1818  (1.702 m)     Head Circumference --      Peak Flow --      Pain Score 02/14/18 1818 10     Pain Loc --      Pain Edu? --      Excl. in GC? --     Constitutional: Alert and oriented. Well appearing and in no acute distress.  Patient and her daughter both very pleasant. Eyes: Conjunctivae are normal. Head: Atraumatic. Nose: No congestion/rhinnorhea. Mouth/Throat: Mucous membranes are moist. Neck: No stridor.   Cardiovascular: Normal rate, regular rhythm. Grossly normal heart sounds.  Good peripheral circulation. Respiratory: Normal respiratory effort.  No retractions. Lungs CTAB. Gastrointestinal: Soft and nontender. No distention. Musculoskeletal:  RIGHT Right upper extremity demonstrates normal strength, good use of all muscles. No edema bruising or contusions of the right shoulder/upper arm, right elbow, right forearm. Full range of motion of the right right upper extremity without pain except examination of the hand reveals edema and erythema with some  tightening of the skin primarily over the base of the right thumb and thenar eminence, and extends up over the volar and palmar surfaces to the metacarpal phalangeal joints.  There is no one obvious area of fluctuance or abscess, but the area is  warm and red.  Some limitation in movement of the right thumb second and third digits as the patient reports pain primarily arising from the mid palmar region and base of the thumb.. No evidence of trauma. Strong radial pulse. Intact median/ulnar/radial neuro-muscular exam.  LEFT Left upper extremity demonstrates normal strength, good use of all muscles. No edema bruising or contusions of the left shoulder/upper arm, left elbow, left forearm / hand. Full range of motion of the left  upper extremity without pain. No evidence of trauma. Strong radial pulse. Intact median/ulnar/radial neuro-muscular exam.   Neurologic:  Normal speech and language. No gross focal neurologic deficits are appreciated.  Skin:  Skin is warm, dry and intact. No rash noted. Psychiatric: Mood and affect are normal. Speech and behavior are normal.  ____________________________________________   LABS (all labs ordered are listed, but only abnormal results are displayed)  Labs Reviewed  BASIC METABOLIC PANEL - Abnormal; Notable for the following components:      Result Value   Glucose, Bld 104 (*)    Calcium 8.5 (*)    All other components within normal limits  CBC - Abnormal; Notable for the following components:   WBC 23.8 (*)    All other components within normal limits  TROPONIN I - Abnormal; Notable for the following components:   Troponin I 0.05 (*)    All other components within normal limits  DIFFERENTIAL - Abnormal; Notable for the following components:   Eosinophils Absolute 13.1 (*)    All other components within normal limits  CBC WITH DIFFERENTIAL/PLATELET - Abnormal; Notable for the following components:   WBC 23.9 (*)    RDW 15.0 (*)    All other components  within normal limits  DIFFERENTIAL - Abnormal; Notable for the following components:   Eosinophils Absolute 13.1 (*)    All other components within normal limits  CULTURE, BLOOD (ROUTINE X 2)  CULTURE, BLOOD (ROUTINE X 2)  SEDIMENTATION RATE  C-REACTIVE PROTEIN   ____________________________________________  EKG  EKG reviewed and are by me at 2033 Heart rate 95 9 QRS 80 QTC 450 Normal sinus rhythm, no evidence of acute ischemia ____________________________________________  RADIOLOGY    Ultrasound and x-ray reviewed, no evidence of acute abscess ____________________________________________   PROCEDURES  Procedure(s) performed: None  Procedures  Critical Care performed: No  ____________________________________________   INITIAL IMPRESSION / ASSESSMENT AND PLAN / ED COURSE  Pertinent labs & imaging results that were available during my care of the patient were reviewed by me and considered in my medical decision making (see chart for details).      ----------------------------------------- 8:10 PM on 02/14/2018 -----------------------------------------  Called and discussed case with Dr. Joice Lofts of orthopedics.  He recommends obtaining an x-ray of the hand as well as ultrasound soft tissue.  He advises if ultrasound does not demonstrate a fluid collection then likely admission for IV antibiotics and close infection monitoring.  If there is infected deep tissue abscess concern, patient would likely require transfer for hand surgery consult.  I have ordered ultrasound as well as x-ray.  Patient agreeable.  Discussed stronger pain medication with patient including morphine, but she would like to start with oral tramadol.  Also started vancomycin and Zosyn based on cellulitis sepsis protocol, though the patient does not presently meet sepsis criteria she is certainly elevated risk and concerning for cellulitis involving her right hand, possible abscess. Hypereosinophilic  syndromes are also considered (re: abnormal differential) though I have a hard time clearly identifying this presentaiton  as consist with such.   ----------------------------------------- 10:32 PM on 02/14/2018 -----------------------------------------  Ultrasound result reviewed by Dr. Joice Lofts, he recommends admission for IV antibiotics and will provide consultation for patient as an inpatient tomorrow.  Discussed with the patient, she reports after tramadol her pain is much better.  She is resting comfortably and agreeable with plan for admission.  Continue to monitor and treat carefully and cellulitis involving the right hand.  Also of consequence, her troponin is minimally elevated but without associated cardiac or pulmonary symptoms and obvious inflammation and likely infection cellulitis in the right hand I do not had a clearly associate this.  Not appear to be any sort of acute cardiac condition present.  ____________________________________________   FINAL CLINICAL IMPRESSION(S) / ED DIAGNOSES  Final diagnoses:  Hand pain, right  Cellulitis of right hand  Eosinophilia      NEW MEDICATIONS STARTED DURING THIS VISIT:  New Prescriptions   No medications on file     Note:  This document was prepared using Dragon voice recognition software and may include unintentional dictation errors.     Sharyn Creamer, MD 02/14/18 2233

## 2018-02-14 NOTE — H&P (Signed)
[facility] - [place] at [facility]   PATIENT NAME: Rachael Silva    MR#:  [id number]  DATE OF BIRTH:  05-31-1946  DATE OF ADMISSION:  02/14/2018  PRIMARY CARE PHYSICIAN: Nche, Bonna Gains, NP   REQUESTING/REFERRING PHYSICIAN:   CHIEF COMPLAINT:   Chief Complaint  Patient presents with  . Abnormal Lab    HISTORY OF PRESENT ILLNESS: Rachael Silva  is a 72 y.o. female with a known history of allergic rhinitis, angioedema, osteoarthritis, asthma, hypertension, obstructive sleep apnea and hypothyroidism. Patient presented to emergency room for acute onset of right hand pain and swelling, going on for the past 2 days, gradually getting worse.  The pain and swelling are severe and involved the right second and third knuckle in the bottom of the right thumb.  Right hand has become warm to touch in the past 24 hours.  Patient denies any fever or chills.  No recent trauma.  Patient does recall, however that she was treated for possible cellulitis in her feet approximately 2 weeks ago with Keflex.  Patient first went to the primary care doctor earlier, today and had blood test done.  She was told that her WBC is very high and she was recommended to come to emergency room.  Uric acid level was checked as well and it was normal. Blood test done emergency room show again elevated WBC at 24,000.  Sedimentation rate and C-reactive protein are normal. Troponin level is elevated at 0.05.  She denies any chest pain or shortness of breath.  EKG shows normal sinus rhythm, no acute ischemic changes. Right upper extremity ultrasound, done emergency room, reviewed by myself, shows subcutaneous soft tissue edema, nonspecific but likely related to cellulitis; no abscess. Patient is admitted for further evaluation and treatment.   PAST MEDICAL HISTORY:   Past Medical History:  Diagnosis Date  . Allergy    allergic rhinitis  . Angioedema 02/2006  . Arthritis    OA of knees  .  Asthma   . Complication of anesthesia    apnea- sleep  . Frequent headaches   . Hiatal hernia   . Hypertension   . Hypothyroid   . Labile blood pressure   . Lower extremity edema   . Lung disease   . Pneumonia   . Pneumonia, eosinophilic (HCC)   . Shortness of breath dyspnea   . Sleep apnea   . Urinary incontinence   . Wheezing     PAST SURGICAL HISTORY:  Past Surgical History:  Procedure Laterality Date  . BREAST BIOPSY Left 2013   NEG  . BREAST SURGERY  1966   breast biopsy  . CESAREAN SECTION     x2  . FLEXIBLE BRONCHOSCOPY N/A 09/16/2015   Procedure: FLEXIBLE BRONCHOSCOPY WITH FLURO ;  Surgeon: Stephanie Acre, MD;  Location: [facility] ORS;  Service: Pulmonary;  Laterality: N/A;    SOCIAL HISTORY:  Social History   Tobacco Use  . Smoking status: Never Smoker  . Smokeless tobacco: Never Used  Substance Use Topics  . Alcohol use: No    Alcohol/week: 0.0 oz    FAMILY HISTORY:  Family History  Problem Relation Age of Onset  . Diabetes Mother        pre-diabetic  . Heart disease Mother        ? heart disease  . Emphysema Mother   . Thyroid disease Mother   . Diabetes Maternal Uncle   . Breast cancer Neg Hx     DRUG  ALLERGIES:  Allergies  Allergen Reactions  . Atorvastatin     Muscle pain   . Erythromycin     REACTION: feels sick    REVIEW OF SYSTEMS:   CONSTITUTIONAL: No fever, fatigue or weakness.  EYES: No blurred or double vision.  EARS, NOSE, AND THROAT: No tinnitus or ear pain.  RESPIRATORY: No cough, shortness of breath, wheezing or hemoptysis.  CARDIOVASCULAR: No chest pain, orthopnea, edema.  GASTROINTESTINAL: No nausea, vomiting, diarrhea or abdominal pain.  GENITOURINARY: No dysuria, hematuria.  ENDOCRINE: No polyuria, nocturia,  HEMATOLOGY: No anemia, easy bruising or bleeding SKIN: No rash or lesion. MUSCULOSKELETAL: No joint pain or arthritis.   NEUROLOGIC: No tingling, numbness, weakness.  PSYCHIATRY: No anxiety or depression.    MEDICATIONS AT HOME:  Prior to Admission medications   Medication Sig Start Date End Date Taking? Authorizing Provider  albuterol (ACCUNEB) 0.63 MG/3ML nebulizer solution Take 3 mLs (0.63 mg total) by nebulization every 6 (six) hours as needed for wheezing or shortness of breath. 02/12/18  Yes Nche, Bonna Gains, NP  albuterol (VENTOLIN HFA) 108 (90 Base) MCG/ACT inhaler TAKE 1 TO 2 PUFFS EVERY 6 HOURS AS NEEDED 01/16/18  Yes Nche, Bonna Gains, NP  B Complex-C-Folic Acid (STRESS B COMPLEX PO) Take 1 tablet by mouth daily.   Yes [provider]  Calcium Carbonate-Vitamin D (CALTRATE 600+D PO) Take 1 tablet by mouth 2 (two) times daily.   Yes [provider]  co-enzyme Q-10 30 MG capsule Take 200 mg by mouth daily.   Yes [provider]  diphenhydrAMINE (BENADRYL ALLERGY) 25 MG tablet Take 1 tablet (25 mg total) by mouth every 8 (eight) hours for 1 day, THEN 1 tablet (25 mg total) every 12 (twelve) hours for 2 days, THEN 1 tablet (25 mg total) every 8 (eight) hours as needed for up to 2 days for itching. 10/19/17 02/14/18 Yes Nche, Bonna Gains, NP  EPIPEN 2-PAK 0.3 MG/0.3ML SOAJ injection See admin instructions. 09/24/15  Yes [provider]  fexofenadine (ALLEGRA ALLERGY) 60 MG tablet Take 60 mg by mouth daily.    Yes [provider]  Fluticasone-Salmeterol (ADVAIR DISKUS) 250-50 MCG/DOSE AEPB Inhale 1 puff into the lungs 2 (two) times daily.    Yes [provider]  furosemide (LASIX) 20 MG tablet Take 0.5 tablets (10 mg total) by mouth daily. 01/24/18  Yes Nche, Bonna Gains, NP  GARLIC PO Take 1 capsule by mouth daily.   Yes [provider]  levothyroxine (SYNTHROID, LEVOTHROID) 50 MCG tablet TAKE 1 TABLET BY MOUTH EVERY DAY 08/29/17  Yes Tower, Marne A, MD  montelukast (SINGULAIR) 10 MG tablet TAKE 1 TABLET BY MOUTH EVERY DAY 11/30/17  Yes Nche, Bonna Gains, NP  Multiple Vitamins-Minerals (CENTRUM ADULTS PO) Take 1 tablet by mouth  daily.   Yes [provider]  omeprazole (PRILOSEC) 40 MG capsule Take 1 capsule (40 mg total) by mouth daily. Patient taking differently: Take 20 mg by mouth daily.  06/07/15  Yes Mungal, Eston Esters, MD  ONE TOUCH ULTRA TEST test strip USE TO TEST TWICE DAILY DX E09.9 06/18/17  Yes Tower, Audrie Gallus, MD  ONETOUCH DELICA LANCETS 33G MISC USE TO TEST SUGARS TWICE DAILY DX E09.9 04/26/17  Yes Tower, Marne A, MD  triamcinolone ointment (KENALOG) 0.1 % Apply 1 application topically 2 (two) times daily. 10/19/17  Yes Nche, Bonna Gains, NP  cephALEXin (KEFLEX) 500 MG capsule Take 1 capsule (500 mg total) by mouth 3 (three) times daily. Patient not taking:  Reported on 02/14/2018 02/14/18   Everrett Coombe, DO  indomethacin (INDOCIN) 50 MG capsule Take 1 capsule (50 mg total) by mouth 2 (two) times daily with a meal. Patient not taking: Reported on 02/14/2018 02/14/18   Everrett Coombe, DO  metFORMIN (GLUCOPHAGE XR) 500 MG 24 hr tablet Take 1 tablet (500 mg total) by mouth daily with breakfast. Patient not taking: Reported on 02/14/2018 12/26/17   Nche, Bonna Gains, NP  potassium chloride SA (K-DUR,KLOR-CON) 20 MEQ tablet Take 1 tablet (20 mEq total) by mouth daily. Patient not taking: Reported on 02/14/2018 12/24/17   Nche, Bonna Gains, NP  Tiotropium Bromide Monohydrate (SPIRIVA RESPIMAT) 1.25 MCG/ACT AERS Inhale 2 puffs into the lungs 2 (two) times daily. Patient not taking: Reported on 02/14/2018 11/06/17   Erin Fulling, MD  traMADol (ULTRAM) 50 MG tablet Take 0.5-1 tablets (25-50 mg total) by mouth every 12 (twelve) hours as needed. Patient not taking: Reported on 02/14/2018 01/31/18   Nche, Bonna Gains, NP      PHYSICAL EXAMINATION:   VITAL SIGNS: Blood pressure 110/71, pulse 89, temperature 98.4 F (36.9 C), temperature source Oral, resp. rate (!) 22, height  (1.702 m), weight 117 kg (258 lb), SpO2 97 %.  GENERAL:  72 y.o.-year-old patient lying in the bed with moderate distress, secondary to right  hand pain.  EYES: Pupils equal, round, reactive to light and accommodation. No scleral icterus. Extraocular muscles intact.  HEENT: Head atraumatic, normocephalic. Oropharynx and nasopharynx clear.  NECK:  Supple, no jugular venous distention. No thyroid enlargement, no tenderness.  LUNGS: Normal breath sounds bilaterally, no wheezing, rales,rhonchi or crepitation. No use of accessory muscles of respiration.  CARDIOVASCULAR: S1, S2 normal. No S3/S4.  ABDOMEN: Soft, nontender, nondistended. Bowel sounds present. No organomegaly or mass.  EXTREMITIES: Positive for right hand edema, erythema and tenderness.  Reduced range of motion at right PPI joints, due to swelling and tenderness.  No ulcers or injuries noted at the right hand. NEUROLOGIC: Cranial nerves II through XII are intact. Muscle strength 5/5 in all extremities. Sensation intact. Gait not checked.  PSYCHIATRIC: The patient is alert and oriented x 3.  SKIN: No obvious rash, lesion, or ulcer.   LABORATORY PANEL:   CBC Recent Labs  Lab 02/14/18 1157 02/14/18 1820 02/14/18 2028  WBC 22.0 Repeated and verified X2.* 23.8* 23.9*  HGB 12.2 12.1 12.2  HCT 36.6 36.4 36.5  PLT 324.0 334 304  MCV 85.5 86.2 86.2  MCH  --  28.7 28.7  MCHC 33.3 33.3 33.3  RDW 14.5 14.5 15.0*  LYMPHSABS 2.0 2.9  2.0 DUPLICATE REQUEST  MONOABS 0.6 1.0*  0.4 DUPLICATE REQUEST  EOSABS 13.9* 12.8*  13.1* DUPLICATE REQUEST  BASOSABS 0.1 0.0  0.1 DUPLICATE REQUEST   ------------------------------------------------------------------------------------------------------------------  Chemistries  Recent Labs  Lab 02/14/18 1820  NA 135  K 3.6  CL 101  CO2 24  GLUCOSE 104*  BUN 14  CREATININE 0.80  CALCIUM 8.5*   ------------------------------------------------------------------------------------------------------------------ estimated creatinine clearance is 86.6 mL/min (by C-G formula based on SCr of 0.8  mg/dL). ------------------------------------------------------------------------------------------------------------------ No results for input(s): TSH, T4TOTAL, T3FREE, THYROIDAB in the last 72 hours.  Invalid input(s): FREET3   Coagulation profile No results for input(s): INR, PROTIME in the last 168 hours. ------------------------------------------------------------------------------------------------------------------- No results for input(s): DDIMER in the last 72 hours. -------------------------------------------------------------------------------------------------------------------  Cardiac Enzymes Recent Labs  Lab 02/14/18 1820  TROPONINI 0.05*   ------------------------------------------------------------------------------------------------------------------ Invalid input(s): POCBNP  ---------------------------------------------------------------------------------------------------------------  Urinalysis No results found for: COLORURINE, APPEARANCEUR, LABSPEC, PHURINE,  GLUCOSEU, HGBUR, BILIRUBINUR, KETONESUR, PROTEINUR, UROBILINOGEN, NITRITE, LEUKOCYTESUR   RADIOLOGY: Dg Hand Complete Right  Result Date: 02/14/2018 CLINICAL DATA:  Swelling.  Pain.  No known injury. EXAM: RIGHT HAND - COMPLETE 3+ VIEW COMPARISON:  None. FINDINGS: There is no evidence of fracture or dislocation. There is no evidence of erosive arthropathy or other focal bone abnormality. Mild soft tissue swelling. Degenerative change PIP and DIP joints consistent with osteoarthritis. IMPRESSION: No fracture or erosive arthropathy. Electronically Signed   By: Elsie Stain M.D.   On: 02/14/2018 20:22   [place] Rt Upper Extrem Ltd Soft Tissue Non Vascular  Result Date: 02/14/2018 CLINICAL DATA:  Erythema and swelling along the palm of the right hand near the thumb. EXAM: ULTRASOUND RIGHT UPPER EXTREMITY LIMITED TECHNIQUE: Ultrasound examination of the upper extremity soft tissues was performed in the area of  clinical concern. COMPARISON:  Right hand radiographs from 02/14/2017 FINDINGS: Targeted study of the right palm near the thumb was performed over the area of swelling. Mild subcutaneous soft tissue edema is seen along the palmar aspect of the right hand in the area imaged. No drainable fluid collection is seen. IMPRESSION: Subcutaneous soft tissue edema, nonspecific but likely related to cellulitis. Electronically Signed   By: Tollie Eth M.D.   On: 02/14/2018 22:06    EKG: Orders placed or performed during the hospital encounter of 02/14/18  . ED EKG  . ED EKG  . EKG 12-Lead  . EKG 12-Lead    IMPRESSION AND PLAN:  1.  Acute right hand cellulitis, of unclear etiology.  Will start treatment with IV antibiotics.  Continue to monitor clinically closely. 2.  NSTEMI.  Troponin level is slightly elevated at 0.05.  This is likely related to acute infectious process.  However, we will continue to monitor patient on telemetry and follow troponin levels to rule out ACS.  Will start patient on aspirin. 3.  Hypertension, stable, will restart home medications. 4.  OSA, will continue CPAP at night.  All the records are reviewed and case discussed with ED provider. Management plans discussed with the patient, family and they are in agreement.  CODE STATUS: FULL    TOTAL TIME TAKING CARE OF THIS PATIENT: 40 minutes.    Cammy Copa M.D on 02/14/2018 at 11:49 PM  Between 7am to 6pm - Pager - [phone]  After 6pm go to [email or web address] - password [email or web address] [facility]  [place] [facility]  Office  [phone]  CC: Primary care physician; Nche, Bonna Gains, NP

## 2018-02-14 NOTE — Telephone Encounter (Signed)
Critical Value  22.0 White Count Huge amount of EOS Has to make and look at smear. Spoke with Provider within the requirement.  -DMG

## 2018-02-14 NOTE — ED Triage Notes (Signed)
FIRST NURSE NOTE-here for elevated WBC/abnormal CBC.  Alert and oriented. Unlabored

## 2018-02-14 NOTE — Telephone Encounter (Signed)
fyi

## 2018-02-14 NOTE — Progress Notes (Signed)
Rachael Silva - 72 y.o. female MRN [id number]  Date of birth: 1945/12/03  Subjective Chief Complaint  Patient presents with  . Hand Pain    right hand is swollen around the thumb. Started last night     HPI Rachael Silva is a 72 y.o. female here today with complaint of right hand swelling.  Swelling began last night and has become worse throughout the morning today.  Hand feels "stiff" and she is unable to close to make a fist.  She denies any injury or overuse. She was recently treated for cellulitis of her feet with cephalexin which she states has improved.  She has no known prior history of gout and denies increased pain in other joints.  She has been afebrile and denies any other associated symptoms.  She did try some naproxen and found some minor relief from this.  ROS:  ROS completed and negative except as noted per HPI  Allergies  Allergen Reactions  . Atorvastatin     Muscle pain   . Erythromycin     REACTION: feels sick    Past Medical History:  Diagnosis Date  . Allergy    allergic rhinitis  . Angioedema 02/2006  . Arthritis    OA of knees  . Asthma   . Complication of anesthesia    apnea- sleep  . Frequent headaches   . Hiatal hernia   . Hypertension   . Hypothyroid   . Labile blood pressure   . Lower extremity edema   . Lung disease   . Pneumonia   . Pneumonia, eosinophilic (HCC)   . Shortness of breath dyspnea   . Sleep apnea   . Urinary incontinence   . Wheezing     Past Surgical History:  Procedure Laterality Date  . BREAST BIOPSY Left 2013   NEG  . BREAST SURGERY  1966   breast biopsy  . CESAREAN SECTION     x2  . FLEXIBLE BRONCHOSCOPY N/A 09/16/2015   Procedure: FLEXIBLE BRONCHOSCOPY WITH FLURO ;  Surgeon: Stephanie Acre, MD;  Location: [facility] ORS;  Service: Pulmonary;  Laterality: N/A;    Social History   Socioeconomic History  . Marital status: Single    Spouse name: Not on file  . Number of children: 2  . Years of education: Not on  file  . Highest education level: Not on file  Occupational History  . Occupation: retired    Associate Professor: RETIRED  Social Needs  . Financial resource strain: Not on file  . Food insecurity:    Worry: Not on file    Inability: Not on file  . Transportation needs:    Medical: Not on file    Non-medical: Not on file  Tobacco Use  . Smoking status: Never Smoker  . Smokeless tobacco: Never Used  Substance and Sexual Activity  . Alcohol use: No    Alcohol/week: 0.0 oz  . Drug use: No  . Sexual activity: Never  Lifestyle  . Physical activity:    Days per week: Not on file    Minutes per session: Not on file  . Stress: Not on file  Relationships  . Social connections:    Talks on phone: Not on file    Gets together: Not on file    Attends religious service: Not on file    Active member of club or organization: Not on file    Attends meetings of clubs or organizations: Not on file    Relationship status: Not on  file  Other Topics Concern  . Not on file  Social History Narrative  . Not on file    Family History  Problem Relation Age of Onset  . Diabetes Mother        pre-diabetic  . Heart disease Mother        ? heart disease  . Emphysema Mother   . Thyroid disease Mother   . Diabetes Maternal Uncle   . Breast cancer Neg Hx     Health Maintenance  Topic Date Due  . URINE MICROALBUMIN  11/22/2017  . FOOT EXAM  12/01/2017  . TETANUS/TDAP  10/04/2018 (Originally 10/09/2013)  . INFLUENZA VACCINE  05/09/2018  . OPHTHALMOLOGY EXAM  05/15/2018  . HEMOGLOBIN A1C  06/22/2018  . COLONOSCOPY  07/20/2018  . MAMMOGRAM  12/21/2018  . DEXA SCAN  Completed  . Hepatitis C Screening  Completed  . PNA vac Low Risk Adult  Completed    ----------------------------------------------------------------------------------------------------------------------------------------------------------------------------------------------------------------- Physical Exam BP (!) 150/80 (BP Location:  Right Arm, Patient Position: Sitting, Cuff Size: Large)   Pulse (!) 107   Temp 97.8 F (36.6 C) (Oral)   Physical Exam  Constitutional: She is oriented to person, place, and time. She appears well-nourished. No distress.  HENT:  Head: Normocephalic and atraumatic.  Mouth/Throat: Oropharynx is clear and moist.  Cardiovascular: Normal rate, regular rhythm and normal heart sounds.  Pulmonary/Chest: Effort normal and breath sounds normal.  Musculoskeletal:  R hand: There is swelling at the base of the thumb extending into the thenar eminence with erythema and warmth.  She is able to move the thumb but it is painful.    Neurological: She is alert and oriented to person, place, and time.  Psychiatric: She has a normal mood and affect. Her behavior is normal.    ------------------------------------------------------------------------------------------------------------------------------------------------------------------------------------------------------------------- Assessment and Plan  Swelling of right hand Clinically appears as gout however given recent cellulitis this remains in the differential as well and will cover for this.  She will restart cephalexin Start indomethacin Check uric acid and cbc w/ diff I will see her back on 5/13 and she is instructed to call or seek emergency care for any worsening or development of additional symptoms.

## 2018-02-14 NOTE — Telephone Encounter (Signed)
Patient has an appointment this AM. Triage notes

## 2018-02-14 NOTE — Assessment & Plan Note (Signed)
Clinically appears as gout however given recent cellulitis this remains in the differential as well and will cover for this.  She will restart cephalexin Start indomethacin Check uric acid and cbc w/ diff I will see her back on 5/13 and she is instructed to call or seek emergency care for any worsening or development of additional symptoms.

## 2018-02-14 NOTE — ED Notes (Signed)
Patient transported to ultrasound.

## 2018-02-14 NOTE — Progress Notes (Signed)
Pharmacy Antibiotic Note  Rachael Silva is a 72 y.o. female admitted on 02/14/2018 with cellulitis.  Pharmacy has been consulted for vanc/zosyn dosing.  Plan: Patient received vanc 1g and zosyn 3.375g IV x 1  Will continue w/ vanc 1.25g IV q12h w/ 6 hour stack Will draw vanc trough 05/11 @ 1500 prior to 4th dose. Will continue zosyn 3.375g IV q8h  Ke 0.0762 T1/2 9 ~ 12 hrs Goal trough 15 - 20 mcg/mL (leukocytosis, symptoms worsening)  Height:  (170.2 cm) Weight: 258 lb (117 kg) IBW/kg (Calculated) : 61.6  Temp (24hrs), Avg:98.1 F (36.7 C), Min:97.8 F (36.6 C), Max:98.4 F (36.9 C)  Recent Labs  Lab 02/14/18 1157 02/14/18 1820 02/14/18 2028  WBC 22.0 Repeated and verified X2.* 23.8* 23.9*  CREATININE  --  0.80  --     Estimated Creatinine Clearance: 86.6 mL/min (by C-G formula based on SCr of 0.8 mg/dL).    Allergies  Allergen Reactions  . Atorvastatin     Muscle pain   . Erythromycin     REACTION: feels sick    Thank you for allowing pharmacy to be a part of this patient's care.  Thomasene Ripple, PharmD, BCPS Clinical Pharmacist 02/14/2018

## 2018-02-14 NOTE — Patient Instructions (Addendum)
I am going to start you on some medication for gout, however given your recent infection of your legs I am also restarting your cephalexin.  DO NOT take naproxen/aleve or ibuprofen with the indomethacin  I would like to recheck your hand on Monday.  If you notice any worsening please call us back here tomorrow or seek emergency care over the weekend.   Gout Gout is painful swelling that can happen in some of your joints. Gout is a type of arthritis. This condition is caused by having too much uric acid in your body. Uric acid is a chemical that is made when your body breaks down substances called purines. If your body has too much uric acid, sharp crystals can form and build up in your joints. This causes pain and swelling. Gout attacks can happen quickly and be very painful (acute gout). Over time, the attacks can affect more joints and happen more often (chronic gout). Follow these instructions at home: During a Gout Attack  If directed, put ice on the painful area: ? Put ice in a plastic bag. ? Place a towel between your skin and the bag. ? Leave the ice on for 20 minutes, 2-3 times a day.  Rest the joint as much as possible. If the joint is in your leg, you may be given crutches to use.  Raise (elevate) the painful joint above the level of your heart as often as you can.  Drink enough fluids to keep your pee (urine) clear or pale yellow.  Take over-the-counter and prescription medicines only as told by your doctor.  Do not drive or use heavy machinery while taking prescription pain medicine.  Follow instructions from your doctor about what you can or cannot eat and drink.  Return to your normal activities as told by your doctor. Ask your doctor what activities are safe for you. Avoiding Future Gout Attacks  Follow a low-purine diet as told by a specialist (dietitian) or your doctor. Avoid foods and drinks that have a lot of purines, such  as: ? Liver. ? Kidney. ? Anchovies. ? Asparagus. ? Herring. ? Mushrooms ? Mussels. ? Beer.  Limit alcohol intake to no more than 1 drink a day for nonpregnant women and 2 drinks a day for men. One drink equals 12 oz of beer, 5 oz of wine, or 1 oz of hard liquor.  Stay at a healthy weight or lose weight if you are overweight. If you want to lose weight, talk with your doctor. It is important that you do not lose weight too fast.  Start or continue an exercise plan as told by your doctor.  Drink enough fluids to keep your pee clear or pale yellow.  Take over-the-counter and prescription medicines only as told by your doctor.  Keep all follow-up visits as told by your doctor. This is important. Contact a doctor if:  You have another gout attack.  You still have symptoms of a gout attack after10 days of treatment.  You have problems (side effects) because of your medicines.  You have chills or a fever.  You have burning pain when you pee (urinate).  You have pain in your lower back or belly. Get help right away if:  You have very bad pain.  Your pain cannot be controlled.  You cannot pee. This information is not intended to replace advice given to you by your health care provider. Make sure you discuss any questions you have with your health care provider. Document  Released: 07/04/2008 Document Revised: 03/02/2016 Document Reviewed: 07/08/2015 Elsevier Interactive Patient Education  [employer].

## 2018-02-14 NOTE — ED Notes (Signed)
Date and time results received: 02/14/18 1904 (use smartphrase ".now" to insert current time)  Test: troponin Critical Value: 0.05  Name of Provider Notified: kendal charge rn

## 2018-02-14 NOTE — Telephone Encounter (Signed)
Called patient to discuss lab results.   WBC is elevated with significant eosinophilia.  Has had elevated eos in the past but nothing like this.  Possibly exacerbated by recent cephalexin use.  With this finding and acute swelling of the hand I recommended that she be seen at the ER this evening.  Instructed to hold off on medication prescribed today for now.  She is agreeable to go and will go to [facility].

## 2018-02-14 NOTE — Telephone Encounter (Signed)
Pt called with a painful right thumb. She states is swelling (circular area) that has increased since last night. Redness and numbness also. She is using ice and that helps some but still painful. No other symptoms Appointment made per protocol for today. Will notify [facility] at [facility]. Pt advised to call back if worse and she voiced understanding..  Reason for Disposition . [1] SEVERE pain (e.g., excruciating) AND [2] not improved after 2 hours of pain medicine  Answer Assessment - Initial Assessment Questions 1. ONSET: "When did the pain start?"      Last night 2. LOCATION and RADIATION: "Where is the pain located?"  (e.g., fingertip, around nail, joint, entire  finger)      Right thumb 3. SEVERITY: "How bad is the pain?" "What does it keep you from doing?"   (Scale 1-10; or mild, moderate, severe)  - MILD - doesn't interfere with normal activities   - MODERATE - interferes with normal activities or awakens from sleep  - SEVERE - excruciating pain, unable to hold a glass of water or bend finger even a little     Pain # 10 4. APPEARANCE: "What does the finger look like?" (e.g., redness, swelling, bruising, pallor)     Swelling, redness before applying ice 5. WORK OR EXERCISE: "Has there been any recent work or exercise that involved this part of the body?"     no 6. CAUSE: "What do you think is causing the pain?"     Not sure 7. AGGRAVATING FACTORS: "What makes the pain worse?" (e.g., using computer)     Using the hand makes it worst 8. OTHER SYMPTOMS: "Do you have any other symptoms?" (e.g., fever, neck pain, numbness)     numbness 9. PREGNANCY: "Is there any chance you are pregnant?" "When was your last menstrual period?"     no  Protocols used: FINGER PAIN-A-AH

## 2018-02-15 ENCOUNTER — Other Ambulatory Visit: Payer: Self-pay

## 2018-02-15 LAB — TROPONIN I
TROPONIN I: 0.05 ng/mL — AB (ref <0.03)
TROPONIN I: 0.03 ng/mL — AB (ref <0.03)
TROPONIN I: 0.05 ng/mL — AB (ref <0.03)

## 2018-02-15 LAB — BASIC METABOLIC PANEL
ANION GAP: 7 (ref 5–15)
BUN: 15 mg/dL (ref 6–20)
CALCIUM: 8.4 mg/dL — AB (ref 8.9–10.3)
CO2: 26 mmol/L (ref 22–32)
Chloride: 104 mmol/L (ref 101–111)
Creatinine, Ser: 0.72 mg/dL (ref 0.44–1.00)
Glucose, Bld: 119 mg/dL — ABNORMAL HIGH (ref 65–99)
POTASSIUM: 3.5 mmol/L (ref 3.5–5.1)
Sodium: 137 mmol/L (ref 135–145)

## 2018-02-15 LAB — CBC
HEMATOCRIT: 32.8 % — AB (ref 35.0–47.0)
HEMOGLOBIN: 11.1 g/dL — AB (ref 12.0–16.0)
MCH: 29.2 pg (ref 26.0–34.0)
MCHC: 33.7 g/dL (ref 32.0–36.0)
MCV: 86.5 fL (ref 80.0–100.0)
Platelets: 297 10*3/uL (ref 150–440)
RBC: 3.79 MIL/uL — AB (ref 3.80–5.20)
RDW: 14.7 % — ABNORMAL HIGH (ref 11.5–14.5)
WBC: 23.7 10*3/uL — ABNORMAL HIGH (ref 3.6–11.0)

## 2018-02-15 LAB — GLUCOSE, CAPILLARY: GLUCOSE-CAPILLARY: 89 mg/dL (ref 65–99)

## 2018-02-15 LAB — C-REACTIVE PROTEIN: CRP: 9.3 mg/dL — ABNORMAL HIGH (ref <1.0)

## 2018-02-15 MED ORDER — HYDROCODONE-ACETAMINOPHEN 5-325 MG PO TABS
1.0000 | ORAL_TABLET | ORAL | Status: DC | PRN
  Administered 2018-02-15 – 2018-02-16 (×4): 1 via ORAL
  Filled 2018-02-15 (×4): qty 1

## 2018-02-15 MED ORDER — LORATADINE 10 MG PO TABS
10.0000 mg | ORAL_TABLET | Freq: Every day | ORAL | Status: DC
  Administered 2018-02-15 – 2018-02-16 (×2): 10 mg via ORAL
  Filled 2018-02-15 (×2): qty 1

## 2018-02-15 MED ORDER — TRAZODONE HCL 50 MG PO TABS: 25.0000 mg | ORAL_TABLET | Freq: Every evening | ORAL | Status: DC | PRN

## 2018-02-15 MED ORDER — ONDANSETRON HCL 4 MG PO TABS: 4.0000 mg | ORAL_TABLET | Freq: Four times a day (QID) | ORAL | Status: DC | PRN

## 2018-02-15 MED ORDER — BISACODYL 5 MG PO TBEC: 5.0000 mg | DELAYED_RELEASE_TABLET | Freq: Every day | ORAL | Status: DC | PRN

## 2018-02-15 MED ORDER — ADULT MULTIVITAMIN W/MINERALS CH
ORAL_TABLET | Freq: Every day | ORAL | Status: DC
  Administered 2018-02-15 – 2018-02-16 (×2): 1 via ORAL
  Filled 2018-02-15 (×2): qty 1

## 2018-02-15 MED ORDER — FLUTICASONE FUROATE-VILANTEROL 200-25 MCG/INH IN AEPB
1.0000 | INHALATION_SPRAY | Freq: Every day | RESPIRATORY_TRACT | Status: DC
  Administered 2018-02-15 – 2018-02-16 (×2): 1 via RESPIRATORY_TRACT
  Filled 2018-02-15: qty 28

## 2018-02-15 MED ORDER — PANTOPRAZOLE SODIUM 40 MG PO TBEC
40.0000 mg | DELAYED_RELEASE_TABLET | Freq: Every day | ORAL | Status: DC
  Administered 2018-02-15 – 2018-02-16 (×2): 40 mg via ORAL
  Filled 2018-02-15 (×2): qty 1

## 2018-02-15 MED ORDER — ASPIRIN EC 325 MG PO TBEC
325.0000 mg | DELAYED_RELEASE_TABLET | Freq: Every day | ORAL | Status: DC
  Administered 2018-02-15 – 2018-02-16 (×2): 325 mg via ORAL
  Filled 2018-02-15 (×2): qty 1

## 2018-02-15 MED ORDER — CALCIUM CARBONATE-VITAMIN D 500-200 MG-UNIT PO TABS
ORAL_TABLET | Freq: Two times a day (BID) | ORAL | Status: DC
  Administered 2018-02-15 – 2018-02-16 (×4): 1 via ORAL
  Filled 2018-02-15 (×4): qty 1

## 2018-02-15 MED ORDER — HEPARIN SODIUM (PORCINE) 5000 UNIT/ML IJ SOLN
5000.0000 [IU] | Freq: Three times a day (TID) | INTRAMUSCULAR | Status: DC
  Administered 2018-02-15 – 2018-02-16 (×5): 5000 [IU] via SUBCUTANEOUS
  Filled 2018-02-15 (×5): qty 1

## 2018-02-15 MED ORDER — DOCUSATE SODIUM 100 MG PO CAPS
100.0000 mg | ORAL_CAPSULE | Freq: Two times a day (BID) | ORAL | Status: DC
  Administered 2018-02-15 – 2018-02-16 (×3): 100 mg via ORAL
  Filled 2018-02-15 (×3): qty 1

## 2018-02-15 MED ORDER — ACETAMINOPHEN 325 MG PO TABS: 650.0000 mg | ORAL_TABLET | Freq: Four times a day (QID) | ORAL | Status: DC | PRN

## 2018-02-15 MED ORDER — ONDANSETRON HCL 4 MG/2ML IJ SOLN: 4.0000 mg | Freq: Four times a day (QID) | INTRAMUSCULAR | Status: DC | PRN

## 2018-02-15 MED ORDER — ALBUTEROL SULFATE (2.5 MG/3ML) 0.083% IN NEBU
2.5000 mg | INHALATION_SOLUTION | Freq: Four times a day (QID) | RESPIRATORY_TRACT | Status: DC | PRN
Start: 2018-02-15 — End: 2018-02-16
  Administered 2018-02-15 – 2018-02-16 (×2): 2.5 mg via RESPIRATORY_TRACT
  Filled 2018-02-15 (×2): qty 3

## 2018-02-15 MED ORDER — SODIUM CHLORIDE 0.9 % IV SOLN
INTRAVENOUS | Status: DC
  Administered 2018-02-15 (×2): via INTRAVENOUS

## 2018-02-15 MED ORDER — OMALIZUMAB 150 MG ~~LOC~~ SOLR: 375.0000 mg | SUBCUTANEOUS | Status: DC

## 2018-02-15 MED ORDER — ACETAMINOPHEN 650 MG RE SUPP
650.0000 mg | Freq: Four times a day (QID) | RECTAL | Status: DC | PRN
Start: 2018-02-15 — End: 2018-02-16

## 2018-02-15 MED ORDER — ALBUTEROL SULFATE HFA 108 (90 BASE) MCG/ACT IN AERS: 1.0000 | INHALATION_SPRAY | RESPIRATORY_TRACT | Status: DC | PRN

## 2018-02-15 MED ORDER — LEVOTHYROXINE SODIUM 50 MCG PO TABS
50.0000 ug | ORAL_TABLET | Freq: Every day | ORAL | Status: DC
  Administered 2018-02-15 – 2018-02-16 (×2): 50 ug via ORAL
  Filled 2018-02-15 (×2): qty 1

## 2018-02-15 NOTE — Progress Notes (Signed)
[facility] - [place] at [facility]   PATIENT NAME: Rachael Silva    MR#:  [id number]  DATE OF BIRTH:  [birth year]  SUBJECTIVE:  CHIEF COMPLAINT: Patient reports right hand swelling is improving and pain significantly improved.    REVIEW OF SYSTEMS:  CONSTITUTIONAL: No fever, fatigue or weakness.  EYES: No blurred or double vision.  EARS, NOSE, AND THROAT: No tinnitus or ear pain.  RESPIRATORY: No cough, shortness of breath, wheezing or hemoptysis.  CARDIOVASCULAR: No chest pain, orthopnea, edema.  GASTROINTESTINAL: No nausea, vomiting, diarrhea or abdominal pain.  GENITOURINARY: No dysuria, hematuria.  ENDOCRINE: No polyuria, nocturia,  HEMATOLOGY: No anemia, easy bruising or bleeding SKIN: No rash or lesion. MUSCULOSKELETAL: Right hand swelling is improving denies any pain today no joint pain or arthritis.   NEUROLOGIC: No tingling, numbness, weakness.  PSYCHIATRY: No anxiety or depression.   DRUG ALLERGIES:   Allergies  Allergen Reactions  . Atorvastatin     Muscle pain   . Erythromycin     REACTION: feels sick    VITALS:  Blood pressure (!) 154/84, pulse 90, temperature 98.6 F (37 C), temperature source Oral, resp. rate 18, height  (1.702 m), weight 118.2 kg (260 lb 9.3 oz), SpO2 93 %.  PHYSICAL EXAMINATION:  GENERAL:  72 y.o.-year-old patient lying in the bed with no acute distress.  EYES: Pupils equal, round, reactive to light and accommodation. No scleral icterus. Extraocular muscles intact.  HEENT: Head atraumatic, normocephalic. Oropharynx and nasopharynx clear.  NECK:  Supple, no jugular venous distention. No thyroid enlargement, no tenderness.  LUNGS: Normal breath sounds bilaterally, no wheezing, rales,rhonchi or crepitation. No use of accessory muscles of respiration.  CARDIOVASCULAR: S1, S2 normal. No murmurs, rubs, or gallops.  ABDOMEN: Soft, nontender, nondistended. Bowel sounds present. No organomegaly or mass.   EXTREMITIES: Right thumb area on the palmar aspect the swelling is significantly improving,  nontender, no discharge  no pedal edema, cyanosis, or clubbing.  NEUROLOGIC: Cranial nerves II through XII are intact. Muscle strength 5/5 in all extremities. Sensation intact. Gait not checked.  PSYCHIATRIC: The patient is alert and oriented x 3.  SKIN: No obvious rash, lesion, or ulcer.    LABORATORY PANEL:   CBC Recent Labs  Lab 02/15/18 0312  WBC 23.7*  HGB 11.1*  HCT 32.8*  PLT 297   ------------------------------------------------------------------------------------------------------------------  Chemistries  Recent Labs  Lab 02/15/18 0312  NA 137  K 3.5  CL 104  CO2 26  GLUCOSE 119*  BUN 15  CREATININE 0.72  CALCIUM 8.4*   ------------------------------------------------------------------------------------------------------------------  Cardiac Enzymes Recent Labs  Lab 02/15/18 0313  TROPONINI 0.05*   ------------------------------------------------------------------------------------------------------------------  RADIOLOGY:  Dg Hand Complete Right  Result Date: 02/14/2018 CLINICAL DATA:  Swelling.  Pain.  No known injury. EXAM: RIGHT HAND - COMPLETE 3+ VIEW COMPARISON:  None. FINDINGS: There is no evidence of fracture or dislocation. There is no evidence of erosive arthropathy or other focal bone abnormality. Mild soft tissue swelling. Degenerative change PIP and DIP joints consistent with osteoarthritis. IMPRESSION: No fracture or erosive arthropathy. Electronically Signed   By: Elsie Stain M.D.   On: 02/14/2018 20:22   [place] Rt Upper Extrem Ltd Soft Tissue Non Vascular  Result Date: 02/14/2018 CLINICAL DATA:  Erythema and swelling along the palm of the right hand near the thumb. EXAM: ULTRASOUND RIGHT UPPER EXTREMITY LIMITED TECHNIQUE: Ultrasound examination of the upper extremity soft tissues was performed in the area of clinical concern. COMPARISON:  Right hand  radiographs from 02/14/2017 FINDINGS: Targeted study of the right palm near the thumb was performed over the area of swelling. Mild subcutaneous soft tissue edema is seen along the palmar aspect of the right hand in the area imaged. No drainable fluid collection is seen. IMPRESSION: Subcutaneous soft tissue edema, nonspecific but likely related to cellulitis. Electronically Signed   By: Tollie Eth M.D.   On: 02/14/2018 22:06    EKG:   Orders placed or performed during the hospital encounter of 02/14/18  . ED EKG  . ED EKG  . EKG 12-Lead  . EKG 12-Lead    ASSESSMENT AND PLAN:    1.  Acute right hand cellulitis, of unclear etiology.   Clinically improving.  Seen by orthopedics no intervention is needed at this time  Continue IV Zosyn and vancomycin  Ultrasound of the hand with soft tissue swelling  Check a.m. labs  2.    Elevated troponin secondary to demand ischemia from the ongoing infectious process  patient is a symptomatic  Troponin level is slightly elevated at 0.05.  This is likely related to acute infectious process,we will continue to monitor patient on telemetry and follow troponin levels to rule out ACS.    Patient started on aspirin   3.  Hypertension, stable, will restart home medications.  4.  OSA, will continue CPAP at night.      All the records are reviewed and case discussed with Care Management/Social Workerr. Management plans discussed with the patient, family and they are in agreement.  CODE STATUS: FC   TOTAL TIME TAKING CARE OF THIS PATIENT: 33 minutes.   POSSIBLE D/C IN 1-2 DAYS, DEPENDING ON CLINICAL CONDITION.  Note: This dictation was prepared with Dragon dictation along with smaller phrase technology. Any transcriptional errors that result from this process are unintentional.   Ramonita Lab M.D on 02/15/2018 at 3:33 PM  Between 7am to 6pm - Pager - [phone] After 6pm go to [email or web address] - password [email or web address] [facility]  [place] [facility]   Office  [phone]  CC: Primary care physician; Nche, Bonna Gains, NP

## 2018-02-15 NOTE — Progress Notes (Signed)
ORTHOPAEDIC CONSULTATION  REQUESTING PHYSICIAN: Ramonita Lab, MD  Chief Complaint:   Right hand pain and swelling.  History of Present Illness: Rachael Silva is a 72 y.o. female with multiple medical problems including hypertension, hypothyroidism, asthma, sleep apnea, frequent headaches, and other breathing issues who presented to the emergency room last evening with a several day history of increasing pain and swelling to her right hand.  She presented to  her primary care provider's office yesterday morning who ordered some blood work.  She was noted to have an elevated white blood cell count and so was advised to go to the emergency room for more definitive management of her presumed cellulitis.  The patient denies any specific injury to the hand and cannot think of any cause that may have precipitated the symptoms.  She was treated with Keflex for possible cellulitis in her feet several weeks ago.  She is able to actively flex and extend all digits, and denies any numbness or paresthesias to her fingers.  Past Medical History:  Diagnosis Date  . Allergy    allergic rhinitis  . Angioedema 02/2006  . Arthritis    OA of knees  . Asthma   . Complication of anesthesia    apnea- sleep  . Frequent headaches   . Hiatal hernia   . Hypertension   . Hypothyroid   . Labile blood pressure   . Lower extremity edema   . Lung disease   . Pneumonia   . Pneumonia, eosinophilic (HCC)   . Shortness of breath dyspnea   . Sleep apnea   . Urinary incontinence   . Wheezing    Past Surgical History:  Procedure Laterality Date  . BREAST BIOPSY Left 2013   NEG  . BREAST SURGERY  1966   breast biopsy  . CESAREAN SECTION     x2  . FLEXIBLE BRONCHOSCOPY N/A 09/16/2015   Procedure: FLEXIBLE BRONCHOSCOPY WITH FLURO ;  Surgeon: Stephanie Acre, MD;  Location: [facility] ORS;  Service: Pulmonary;  Laterality: N/A;   Social History    Socioeconomic History  . Marital status: Single    Spouse name: Not on file  . Number of children: 2  . Years of education: Not on file  . Highest education level: Not on file  Occupational History  . Occupation: retired    Associate Professor: RETIRED  Social Needs  . Financial resource strain: Not on file  . Food insecurity:    Worry: Not on file    Inability: Not on file  . Transportation needs:    Medical: Not on file    Non-medical: Not on file  Tobacco Use  . Smoking status: Never Smoker  . Smokeless tobacco: Never Used  Substance and Sexual Activity  . Alcohol use: No    Alcohol/week: 0.0 oz  . Drug use: No  . Sexual activity: Never  Lifestyle  . Physical activity:    Days per week: Not on file    Minutes per session: Not on file  . Stress: Not on file  Relationships  . Social connections:    Talks on phone: Not on file    Gets together: Not on file    Attends religious service: Not on file    Active member of club or organization: Not on file    Attends meetings of clubs or organizations: Not on file    Relationship status: Not on file  Other Topics Concern  . Not on file  Social History Narrative  . Not  on file   Family History  Problem Relation Age of Onset  . Diabetes Mother        pre-diabetic  . Heart disease Mother        ? heart disease  . Emphysema Mother   . Thyroid disease Mother   . Diabetes Maternal Uncle   . Breast cancer Neg Hx    Allergies  Allergen Reactions  . Atorvastatin     Muscle pain   . Erythromycin     REACTION: feels sick   Prior to Admission medications   Medication Sig Start Date End Date Taking? Authorizing Provider  albuterol (ACCUNEB) 0.63 MG/3ML nebulizer solution Take 3 mLs (0.63 mg total) by nebulization every 6 (six) hours as needed for wheezing or shortness of breath. 02/12/18  Yes Nche, Bonna Gains, NP  albuterol (VENTOLIN HFA) 108 (90 Base) MCG/ACT inhaler TAKE 1 TO 2 PUFFS EVERY 6 HOURS AS NEEDED 01/16/18  Yes Nche,  Bonna Gains, NP  B Complex-C-Folic Acid (STRESS B COMPLEX PO) Take 1 tablet by mouth daily.   Yes [provider]  Calcium Carbonate-Vitamin D (CALTRATE 600+D PO) Take 1 tablet by mouth 2 (two) times daily.   Yes [provider]  co-enzyme Q-10 30 MG capsule Take 200 mg by mouth daily.   Yes [provider]  diphenhydrAMINE (BENADRYL ALLERGY) 25 MG tablet Take 1 tablet (25 mg total) by mouth every 8 (eight) hours for 1 day, THEN 1 tablet (25 mg total) every 12 (twelve) hours for 2 days, THEN 1 tablet (25 mg total) every 8 (eight) hours as needed for up to 2 days for itching. 10/19/17 02/14/18 Yes Nche, Bonna Gains, NP  EPIPEN 2-PAK 0.3 MG/0.3ML SOAJ injection See admin instructions. 09/24/15  Yes [provider]  fexofenadine (ALLEGRA ALLERGY) 60 MG tablet Take 60 mg by mouth daily.    Yes [provider]  Fluticasone-Salmeterol (ADVAIR DISKUS) 250-50 MCG/DOSE AEPB Inhale 1 puff into the lungs 2 (two) times daily.    Yes [provider]  furosemide (LASIX) 20 MG tablet Take 0.5 tablets (10 mg total) by mouth daily. 01/24/18  Yes Nche, Bonna Gains, NP  GARLIC PO Take 1 capsule by mouth daily.   Yes [provider]  levothyroxine (SYNTHROID, LEVOTHROID) 50 MCG tablet TAKE 1 TABLET BY MOUTH EVERY DAY 08/29/17  Yes Tower, Marne A, MD  montelukast (SINGULAIR) 10 MG tablet TAKE 1 TABLET BY MOUTH EVERY DAY 11/30/17  Yes Nche, Bonna Gains, NP  Multiple Vitamins-Minerals (CENTRUM ADULTS PO) Take 1 tablet by mouth daily.   Yes [provider]  omeprazole (PRILOSEC) 40 MG capsule Take 1 capsule (40 mg total) by mouth daily. Patient taking differently: Take 20 mg by mouth daily.  06/07/15  Yes Mungal, Eston Esters, MD  ONE TOUCH ULTRA TEST test strip USE TO TEST TWICE DAILY DX E09.9 06/18/17  Yes Tower, Audrie Gallus, MD  ONETOUCH DELICA LANCETS 33G MISC USE TO TEST SUGARS TWICE DAILY DX E09.9 04/26/17  Yes Tower, Marne A, MD  triamcinolone ointment  (KENALOG) 0.1 % Apply 1 application topically 2 (two) times daily. 10/19/17  Yes Nche, Bonna Gains, NP  cephALEXin (KEFLEX) 500 MG capsule Take 1 capsule (500 mg total) by mouth 3 (three) times daily. Patient not taking: Reported on 02/14/2018 02/14/18   Everrett Coombe, DO  indomethacin (INDOCIN) 50 MG capsule Take 1 capsule (50 mg total) by mouth 2 (two) times daily with a meal. Patient not taking: Reported on 02/14/2018 02/14/18   Ashley Royalty,  [place], DO  metFORMIN (GLUCOPHAGE XR) 500 MG 24 hr tablet Take 1 tablet (500 mg total) by mouth daily with breakfast. Patient not taking: Reported on 02/14/2018 12/26/17   Nche, Bonna Gains, NP  potassium chloride SA (K-DUR,KLOR-CON) 20 MEQ tablet Take 1 tablet (20 mEq total) by mouth daily. Patient not taking: Reported on 02/14/2018 12/24/17   Nche, Bonna Gains, NP  Tiotropium Bromide Monohydrate (SPIRIVA RESPIMAT) 1.25 MCG/ACT AERS Inhale 2 puffs into the lungs 2 (two) times daily. Patient not taking: Reported on 02/14/2018 11/06/17   Erin Fulling, MD  traMADol (ULTRAM) 50 MG tablet Take 0.5-1 tablets (25-50 mg total) by mouth every 12 (twelve) hours as needed. Patient not taking: Reported on 02/14/2018 01/31/18   Nche, Bonna Gains, NP   Dg Hand Complete Right  Result Date: 02/14/2018 CLINICAL DATA:  Swelling.  Pain.  No known injury. EXAM: RIGHT HAND - COMPLETE 3+ VIEW COMPARISON:  None. FINDINGS: There is no evidence of fracture or dislocation. There is no evidence of erosive arthropathy or other focal bone abnormality. Mild soft tissue swelling. Degenerative change PIP and DIP joints consistent with osteoarthritis. IMPRESSION: No fracture or erosive arthropathy. Electronically Signed   By: Elsie Stain M.D.   On: 02/14/2018 20:22   [place] Rt Upper Extrem Ltd Soft Tissue Non Vascular  Result Date: 02/14/2018 CLINICAL DATA:  Erythema and swelling along the palm of the right hand near the thumb. EXAM: ULTRASOUND RIGHT UPPER EXTREMITY LIMITED TECHNIQUE: Ultrasound examination  of the upper extremity soft tissues was performed in the area of clinical concern. COMPARISON:  Right hand radiographs from 02/14/2017 FINDINGS: Targeted study of the right palm near the thumb was performed over the area of swelling. Mild subcutaneous soft tissue edema is seen along the palmar aspect of the right hand in the area imaged. No drainable fluid collection is seen. IMPRESSION: Subcutaneous soft tissue edema, nonspecific but likely related to cellulitis. Electronically Signed   By: Tollie Eth M.D.   On: 02/14/2018 22:06    Positive ROS: All other systems have been reviewed and were otherwise negative with the exception of those mentioned in the HPI and as above.  Physical Exam: General:  Alert, no acute distress Psychiatric:  Patient is competent for consent with normal mood and affect   Cardiovascular:  No pedal edema Respiratory:  No wheezing, non-labored breathing GI:  Abdomen is soft and non-tender Skin:  No lesions in the area of chief complaint Neurologic:  Sensation intact distally Lymphatic:  No axillary or cervical lymphadenopathy  Orthopedic Exam:  Orthopedic examination is limited to the right upper extremity and hand.  There is moderate swelling over the radial aspect of the hand both dorsally and palmarly involving the thenar eminence volarly and extending to the MCP joints both dorsally and palmarly.  Erythema is noted in this area as well.  She has moderate tenderness to palpation over the palmar and dorsal aspects of the radial aspect of her hand.  She is able to actively flex and extend all digits, and has no pain with passive motion of the thumb CMC or MCP joints.  She is able to freely move her wrist as well.  She is neurovascularly intact to her right hand.  X-rays:  X-rays of the right hand are available for review.  These films demonstrate some mild degenerative changes of the PIP and DIP joints but otherwise demonstrates no fractures, lytic lesions, or other acute  bony abnormalities.  An ultrasound of the right hand also has  been obtained.  This demonstrates no evidence for any obvious abscess or fluid collection.  Assessment: Right hand cellulitis.  Plan: The treatment options have been discussed with the patient.  At this point, there does not appear to be any need for surgical intervention.  Therefore, I feel that it is reasonable to continue with IV antibiotics at this time.  Blood cultures are pending.  Once these results are known, if positive, then a more specific antibiotic regimen can be initiated.  Thank you for asked me to participate in the care of this most pleasant woman.  I will be happy to follow her with you.   Maryagnes Amos, MD  Beeper #:  [phone]  02/15/2018 8:00 AM

## 2018-02-15 NOTE — Consult Note (Signed)
[facility] Infectious Disease     Reason for Consult: Eosinophilia, Hand cellulitis    Referring Physician: Date of Admission:  02/14/2018   Active Problems:   Cellulitis of finger of right hand   HPI: Shantese Raven is a 72 y.o. female admitted with acute R hand pain and swelling starting on her thenar eminence with a small bump that then progressed to pain and swelling extending down arm. She was treated as an otpt recently for LE swelling felt to be due to cellulitis as well. On admit wbc was 22 but no fevers. Doland neg.  She does have a cat at home but no known bites. No trauma to hand but cat does like her hands. She also has a hx of eosinophilic pneumonia and ABPA and was following with pulmonary and previously on chronic steroids but now off since around January. She has chronic cough and sob but no worse Since admit on vanco and zosyn. Seen by ortho. [place] and xray show on osteo and no abscess.   Past Medical History:  Diagnosis Date  . Allergy    allergic rhinitis  . Angioedema 02/2006  . Arthritis    OA of knees  . Asthma   . Complication of anesthesia    apnea- sleep  . Frequent headaches   . Hiatal hernia   . Hypertension   . Hypothyroid   . Labile blood pressure   . Lower extremity edema   . Lung disease   . Pneumonia   . Pneumonia, eosinophilic ([place])   . Shortness of breath dyspnea   . Sleep apnea   . Urinary incontinence   . Wheezing    Past Surgical History:  Procedure Laterality Date  . BREAST BIOPSY Left 2013   NEG  . BREAST SURGERY  1966   breast biopsy  . CESAREAN SECTION     x2  . FLEXIBLE BRONCHOSCOPY N/A 09/16/2015   Procedure: FLEXIBLE BRONCHOSCOPY WITH FLURO ;  Surgeon: Vilinda Boehringer, MD;  Location: [facility] ORS;  Service: Pulmonary;  Laterality: N/A;   Social History   Tobacco Use  . Smoking status: Never Smoker  . Smokeless tobacco: Never Used  Substance Use Topics  . Alcohol use: No    Alcohol/week: 0.0 oz  . Drug use: No   Family  History  Problem Relation Age of Onset  . Diabetes Mother        pre-diabetic  . Heart disease Mother        ? heart disease  . Emphysema Mother   . Thyroid disease Mother   . Diabetes Maternal Uncle   . Breast cancer Neg Hx     Allergies:  Allergies  Allergen Reactions  . Atorvastatin     Muscle pain   . Erythromycin     REACTION: feels sick    Current antibiotics: Antibiotics Given (last 72 hours)    Date/Time Action Medication Dose Rate   02/14/18 2101 New Bag/Given   piperacillin-tazobactam (ZOSYN) IVPB 3.375 g 3.375 g 100 mL/hr   02/14/18 2203 New Bag/Given   vancomycin (VANCOCIN) IVPB 1000 mg/200 mL premix 1,000 mg 200 mL/hr   02/15/18 0408 New Bag/Given   vancomycin (VANCOCIN) 1,250 mg in sodium chloride 0.9 % 250 mL IVPB 1,250 mg 166.7 mL/hr   02/15/18 0606 New Bag/Given   piperacillin-tazobactam (ZOSYN) IVPB 3.375 g 3.375 g 12.5 mL/hr   02/15/18 1310 New Bag/Given   piperacillin-tazobactam (ZOSYN) IVPB 3.375 g 3.375 g 12.5 mL/hr   02/15/18 1527 New Bag/Given  vancomycin (VANCOCIN) 1,250 mg in sodium chloride 0.9 % 250 mL IVPB 1,250 mg 166.7 mL/hr      MEDICATIONS: . aspirin EC  325 mg Oral Daily  . calcium-vitamin D   Oral BID  . docusate sodium  100 mg Oral BID  . fluticasone furoate-vilanterol  1 puff Inhalation Daily  . heparin  5,000 Units Subcutaneous Q8H  . levothyroxine  50 mcg Oral QAC breakfast  . loratadine  10 mg Oral Daily  . multivitamin with minerals   Oral Daily  . omalizumab  375 mg Subcutaneous Q14 Days  . pantoprazole  40 mg Oral Daily    Review of Systems - 11 systems reviewed and negative per HPI   OBJECTIVE: Temp:  [97.8 F (36.6 C)-98.6 F (37 C)] 98.3 F (36.8 C) (05/10 1613) Pulse Rate:  [89-98] 95 (05/10 1613) Resp:  [18-25] 18 (05/10 0005) BP: (110-154)/(50-109) 141/63 (05/10 1613) SpO2:  [91 %-100 %] 95 % (05/10 1613) Weight:  [117 kg (258 lb)-118.2 kg (260 lb 9.3 oz)] 118.2 kg (260 lb 9.3 oz) (05/10 0500) Physical  Exam  Constitutional:  oriented to person, place, and time. appears well-developed and well-nourished. No distress.  HENT: [place]/AT, PERRLA, no scleral icterus Mouth/Throat: Oropharynx is clear and moist. No oropharyngeal exudate.  Cardiovascular: Normal rate, regular rhythm and normal heart sounds. Exam reveals no gallop and no friction rub.  No murmur heard.  Pulmonary/Chest: poor air movement,  Neck = supple, no nuchal rigidity Abdominal: Soft. Bowel sounds are normal.  exhibits no distension. There is no tenderness.  Lymphadenopathy: no cervical adenopathy. No axillary adenopathy Neurological: alert and oriented to person, place, and time.  Ext - LUE with mod edema to forearm Skin: L palm and thenar eminence is warm, swollen and tender. No open wounds or obvious bite marks. Psychiatric: a normal mood and affect.  behavior is normal.    LABS: Results for orders placed or performed during the hospital encounter of 02/14/18 (from the past 48 hour(s))  Basic metabolic panel     Status: Abnormal   Collection Time: 02/14/18  6:20 PM  Result Value Ref Range   Sodium 135 135 - 145 mmol/L   Potassium 3.6 3.5 - 5.1 mmol/L   Chloride 101 101 - 111 mmol/L   CO2 24 22 - 32 mmol/L   Glucose, Bld 104 (H) 65 - 99 mg/dL   BUN 14 6 - 20 mg/dL   Creatinine, Ser 0.80 0.44 - 1.00 mg/dL   Calcium 8.5 (L) 8.9 - 10.3 mg/dL   GFR calc non Af Amer >60 >60 mL/min   GFR calc Af Amer >60 >60 mL/min    Comment: (NOTE) The eGFR has been calculated using the CKD EPI equation. This calculation has not been validated in all clinical situations. eGFR's persistently <60 mL/min signify possible Chronic Kidney Disease.    Anion gap 10 5 - 15    Comment: Performed at [facility], [address] [place], [place] [address]  CBC     Status: Abnormal   Collection Time: 02/14/18  6:20 PM  Result Value Ref Range   WBC 23.8 (H) 3.6 - 11.0 K/uL   RBC 4.22 3.80 - 5.20 MIL/uL   Hemoglobin 12.1 12.0 - 16.0  g/dL   HCT 36.4 35.0 - 47.0 %   MCV 86.2 80.0 - 100.0 fL   MCH 28.7 26.0 - 34.0 pg   MCHC 33.3 32.0 - 36.0 g/dL   RDW 14.5 11.5 - 14.5 %  Platelets 334 150 - 440 K/uL    Comment: Performed at [facility], [address] [place], [place] [address]  Troponin I     Status: Abnormal   Collection Time: 02/14/18  6:20 PM  Result Value Ref Range   Troponin I 0.05 (HH) <0.03 ng/mL    Comment: CRITICAL RESULT CALLED TO, READ BACK BY AND VERIFIED WITH Sharmon Leyden RN AT 1900 02/14/18. MSS Performed at [facility], [address] [place], [place] [address]   Differential     Status: Abnormal   Collection Time: 02/14/18  6:20 PM  Result Value Ref Range   Neutro Abs 6.5 1.4 - 6.5 K/uL   Lymphs Abs 2.0 1.0 - 3.6 K/uL   Monocytes Absolute 0.4 0.2 - 0.9 K/uL   Eosinophils Absolute 13.1 (H) 0 - 0.7 K/uL   Basophils Absolute 0.1 0 - 0.1 K/uL   Neutrophils Relative % 30 %   Lymphocytes Relative 12 %   Monocytes Relative 4 %   Eosinophils Relative 54 %   Basophils Relative 0 %   Band Neutrophils 0 %   Metamyelocytes Relative 0 %   Myelocytes 0 %   Promyelocytes Relative 0 %   Blasts 0 %   nRBC 0 0 /100 WBC   Other 0 %   Smear Review MORPHOLOGY UNREMARKABLE     Comment: Performed at [facility], [address] [place], [place] [address]  Differential     Status: Abnormal   Collection Time: 02/14/18  6:20 PM  Result Value Ref Range   Neutrophils Relative % 30 %   Lymphocytes Relative 12 %   Monocytes Relative 4 %   Eosinophils Relative 54 %   Basophils Relative 0 %   Band Neutrophils 0 %   Metamyelocytes Relative 0 %   Myelocytes 0 %   Promyelocytes Relative 0 %   Blasts 0 %   nRBC 0 0 /100 WBC   Other 0 %   Neutro Abs 7.1 (H) 1.4 - 6.5 K/uL    Comment: CORRECTED ON 05/09 AT 2234: PREVIOUSLY REPORTED AS 6.5   Lymphs Abs 2.9 1.0 - 3.6 K/uL    Comment: CORRECTED ON 05/09 AT 2234: PREVIOUSLY REPORTED AS 2.0   Monocytes Absolute 1.0 (H) 0.2 - 0.9  K/uL    Comment: CORRECTED ON 05/09 AT 2234: PREVIOUSLY REPORTED AS 0.4   Eosinophils Absolute 12.8 (H) 0 - 0.7 K/uL    Comment: CORRECTED ON 05/09 AT 2234: PREVIOUSLY REPORTED AS 13.1   Basophils Absolute 0.0 0 - 0.1 K/uL    Comment: Performed at [facility], [address] [place], [place] [address] CORRECTED ON 05/09 AT 2234: PREVIOUSLY REPORTED AS 0.1   Blood Culture (routine x 2)     Status: None (Preliminary result)   Collection Time: 02/14/18  8:28 PM  Result Value Ref Range   Specimen Description BLOOD LEFT FA    Special Requests      BOTTLES DRAWN AEROBIC AND ANAEROBIC Blood Culture adequate volume   Culture      NO GROWTH < 12 HOURS Performed at [facility], [address] [place], [place] [address]    Report Status PENDING   Blood Culture (routine x 2)     Status: None (Preliminary result)   Collection Time: 02/14/18  8:28 PM  Result Value Ref Range   Specimen Description BLOOD RIGHT AC    Special Requests      BOTTLES DRAWN AEROBIC AND ANAEROBIC  Blood Culture adequate volume   Culture      NO GROWTH < 12 HOURS Performed at [facility], [address] [place], [place] [address]    Report Status PENDING   Sedimentation rate     Status: None   Collection Time: 02/14/18  8:28 PM  Result Value Ref Range   Sed Rate 6 0 - 30 mm/hr    Comment: Performed at [facility], [address] [place], [place] [address]  C-reactive protein     Status: Abnormal   Collection Time: 02/14/18  8:28 PM  Result Value Ref Range   CRP 9.3 (H) <1.0 mg/dL    Comment: Performed at [facility], [address], [place], [place] [address]  CBC with Differential     Status: Abnormal   Collection Time: 02/14/18  8:28 PM  Result Value Ref Range   WBC 23.9 (H) 3.6 - 11.0 K/uL   RBC 4.24 3.80 - 5.20 MIL/uL   Hemoglobin 12.2 12.0 - 16.0 g/dL   HCT 36.5 35.0 - 47.0 %   MCV 86.2 80.0 - 100.0 fL   MCH 28.7 26.0 - 34.0 pg   MCHC 33.3 32.0 -  36.0 g/dL   RDW 15.0 (H) 11.5 - 14.5 %   Platelets 304 150 - 440 K/uL    Comment: Performed at [facility], [address] [place], [place] [address]   Neutrophils Relative % DUPLICATE REQUEST %   Neutro Abs DUPLICATE REQUEST 1.7 - 7.7 K/uL   Band Neutrophils DUPLICATE REQUEST %   Lymphocytes Relative DUPLICATE REQUEST %   Lymphs Abs DUPLICATE REQUEST 0.7 - 4.0 K/uL   Monocytes Relative DUPLICATE REQUEST %   Monocytes Absolute DUPLICATE REQUEST 0.1 - 1.0 K/uL   Eosinophils Relative DUPLICATE REQUEST %   Eosinophils Absolute DUPLICATE REQUEST 0.0 - 0.7 K/uL   Basophils Relative DUPLICATE REQUEST %   Basophils Absolute DUPLICATE REQUEST 0.0 - 0.1 K/uL   WBC Morphology DUPLICATE REQUEST    RBC Morphology DUPLICATE REQUEST    Smear Review DUPLICATE REQUEST    Other DUPLICATE REQUEST %   nRBC DUPLICATE REQUEST 0 /614 WBC   Metamyelocytes Relative DUPLICATE REQUEST %   Myelocytes DUPLICATE REQUEST %   Promyelocytes Relative DUPLICATE REQUEST %   Blasts DUPLICATE REQUEST %  Basic metabolic panel     Status: Abnormal   Collection Time: 02/15/18  3:12 AM  Result Value Ref Range   Sodium 137 135 - 145 mmol/L   Potassium 3.5 3.5 - 5.1 mmol/L   Chloride 104 101 - 111 mmol/L   CO2 26 22 - 32 mmol/L   Glucose, Bld 119 (H) 65 - 99 mg/dL   BUN 15 6 - 20 mg/dL   Creatinine, Ser 0.72 0.44 - 1.00 mg/dL   Calcium 8.4 (L) 8.9 - 10.3 mg/dL   GFR calc non Af Amer >60 >60 mL/min   GFR calc Af Amer >60 >60 mL/min    Comment: (NOTE) The eGFR has been calculated using the CKD EPI equation. This calculation has not been validated in all clinical situations. eGFR's persistently <60 mL/min signify possible Chronic Kidney Disease.    Anion gap 7 5 - 15    Comment: Performed at [facility], [address] [place], [place] [address]  CBC     Status: Abnormal   Collection Time: 02/15/18  3:12 AM  Result Value Ref Range   WBC 23.7 (H) 3.6 - 11.0 K/uL   RBC 3.79 (  L) 3.80 - 5.20  MIL/uL   Hemoglobin 11.1 (L) 12.0 - 16.0 g/dL   HCT 32.8 (L) 35.0 - 47.0 %   MCV 86.5 80.0 - 100.0 fL   MCH 29.2 26.0 - 34.0 pg   MCHC 33.7 32.0 - 36.0 g/dL   RDW 14.7 (H) 11.5 - 14.5 %   Platelets 297 150 - 440 K/uL    Comment: Performed at [facility], [address] [place], [place] [address]  Troponin I     Status: Abnormal   Collection Time: 02/15/18  3:13 AM  Result Value Ref Range   Troponin I 0.05 (HH) <0.03 ng/mL    Comment: CRITICAL VALUE NOTED. VALUE IS CONSISTENT WITH PREVIOUSLY REPORTED/CALLED VALUE. JAG Performed at [facility], [address] [place], [place] [address]   Glucose, capillary     Status: None   Collection Time: 02/15/18  8:31 AM  Result Value Ref Range   Glucose-Capillary 89 65 - 99 mg/dL   Comment 1 Notify RN   Troponin I (q 6hr x 3)     Status: Abnormal   Collection Time: 02/15/18  3:56 PM  Result Value Ref Range   Troponin I 0.03 (HH) <0.03 ng/mL    Comment: CRITICAL VALUE NOTED. VALUE IS CONSISTENT WITH PREVIOUSLY REPORTED/CALLED VALUE AKT Performed at [facility], [address] [place], [place] [address]    No components found for: ESR, C REACTIVE PROTEIN MICRO: Recent Results (from the past 720 hour(s))  Blood Culture (routine x 2)     Status: None (Preliminary result)   Collection Time: 02/14/18  8:28 PM  Result Value Ref Range Status   Specimen Description BLOOD LEFT FA  Final   Special Requests   Final    BOTTLES DRAWN AEROBIC AND ANAEROBIC Blood Culture adequate volume   Culture   Final    NO GROWTH < 12 HOURS Performed at [facility], [address], [place],  [address]    Report Status PENDING  Incomplete  Blood Culture (routine x 2)     Status: None (Preliminary result)   Collection Time: 02/14/18  8:28 PM  Result Value Ref Range Status   Specimen Description BLOOD RIGHT [facility]  Final   Special Requests   Final    BOTTLES DRAWN AEROBIC AND ANAEROBIC Blood Culture adequate  volume   Culture   Final    NO GROWTH < 12 HOURS Performed at [facility], [address] [place],  [address]    Report Status PENDING  Incomplete    IMAGING: Dg Hand Complete Right  Result Date: 02/14/2018 CLINICAL DATA:  Swelling.  Pain.  No known injury. EXAM: RIGHT HAND - COMPLETE 3+ VIEW COMPARISON:  None. FINDINGS: There is no evidence of fracture or dislocation. There is no evidence of erosive arthropathy or other focal bone abnormality. Mild soft tissue swelling. Degenerative change PIP and DIP joints consistent with osteoarthritis. IMPRESSION: No fracture or erosive arthropathy. Electronically Signed   By: Staci Righter M.D.   On: 02/14/2018 20:22   [place] Rt Upper Extrem Ltd Soft Tissue Non Vascular  Result Date: 02/14/2018 CLINICAL DATA:  Erythema and swelling along the palm of the right hand near the thumb. EXAM: ULTRASOUND RIGHT UPPER EXTREMITY LIMITED TECHNIQUE: Ultrasound examination of the upper extremity soft tissues was performed in the area of clinical concern. COMPARISON:  Right hand radiographs from 02/14/2017 FINDINGS: Targeted study of the right palm near the thumb was performed over the area of  swelling. Mild subcutaneous soft tissue edema is seen along the palmar aspect of the right hand in the area imaged. No drainable fluid collection is seen. IMPRESSION: Subcutaneous soft tissue edema, nonspecific but likely related to cellulitis. Electronically Signed   By: Ashley Royalty M.D.   On: 02/14/2018 22:06    Assessment:   Mattelyn Imhoff is a 72 y.o. female with eosinophilic PNA, off steroids for last 5 months now with hand swelling, pain and warmth. She has no fever but wbc is quite high at 24 K but 13 K are eosinophils related to her underlying eosinophilic PNA . Her eosinophilia has seemed to increase markedly and she reports she was stopped on her steroids around January of this month. For her hand she does have a cat at home but no reported bites or scratches.    Recommendations At this point would continue treatment for the hand with elevation and zosyn and vanco. If the infection coalesces into an abscess she may need I and D.  Otherwise if clinically improving and all cultures negative could dc when stable on oral augmentin and doxy for a 10 day course  Her WBC will not be a good marker of response since most of the increase is the marked eosinophilia.  She should follow up with pulmonary regarding management of this.  Thank you very much for allowing me to participate in the care of this patient. Please call with questions.   Cheral Marker. Ola Spurr, MD

## 2018-02-16 LAB — CBC
HEMATOCRIT: 31.8 % — AB (ref 35.0–47.0)
HEMOGLOBIN: 10.7 g/dL — AB (ref 12.0–16.0)
MCH: 29.1 pg (ref 26.0–34.0)
MCHC: 33.6 g/dL (ref 32.0–36.0)
MCV: 86.7 fL (ref 80.0–100.0)
Platelets: 287 10*3/uL (ref 150–440)
RBC: 3.67 MIL/uL — AB (ref 3.80–5.20)
RDW: 14.7 % — ABNORMAL HIGH (ref 11.5–14.5)
WBC: 22.8 10*3/uL — ABNORMAL HIGH (ref 3.6–11.0)

## 2018-02-16 LAB — TROPONIN I: Troponin I: 0.04 ng/mL (ref <0.03)

## 2018-02-16 LAB — GLUCOSE, CAPILLARY: Glucose-Capillary: 91 mg/dL (ref 65–99)

## 2018-02-16 MED ORDER — DOXYCYCLINE HYCLATE 100 MG PO TABS: 100.0000 mg | ORAL_TABLET | Freq: Two times a day (BID) | ORAL | 0 refills | Status: DC

## 2018-02-16 MED ORDER — VANCOMYCIN HCL 10 G IV SOLR
1250.0000 mg | Freq: Once | INTRAVENOUS | Status: AC
  Administered 2018-02-16: 1250 mg via INTRAVENOUS
  Filled 2018-02-16: qty 1250

## 2018-02-16 MED ORDER — DOXYCYCLINE HYCLATE 100 MG PO TABS: 100.0000 mg | ORAL_TABLET | Freq: Two times a day (BID) | ORAL | Status: DC

## 2018-02-16 MED ORDER — DOCUSATE SODIUM 100 MG PO CAPS: 100.0000 mg | ORAL_CAPSULE | Freq: Two times a day (BID) | ORAL | 0 refills | Status: DC

## 2018-02-16 MED ORDER — AMOXICILLIN-POT CLAVULANATE 875-125 MG PO TABS: 1.0000 | ORAL_TABLET | Freq: Two times a day (BID) | ORAL | Status: DC

## 2018-02-16 MED ORDER — HYDROCODONE-ACETAMINOPHEN 5-325 MG PO TABS: 1.0000 | ORAL_TABLET | Freq: Four times a day (QID) | ORAL | 0 refills | Status: DC | PRN

## 2018-02-16 MED ORDER — AMOXICILLIN-POT CLAVULANATE 875-125 MG PO TABS: 1.0000 | ORAL_TABLET | Freq: Two times a day (BID) | ORAL | 0 refills | Status: DC

## 2018-02-16 NOTE — Progress Notes (Signed)
Pt ready for discharge home with family. Reviewed all discharge instructions including prescriptions and f/u appointments 

## 2018-02-16 NOTE — Progress Notes (Addendum)
Subjective:   Right hand cellulitis Patient reports pain as 5 on 0-10 scale.   Patient is well, reports that the redness and swelling has improved. Plan is to go Home after hospital stay. Negative for chest pain and shortness of breath Fever: no Gastrointestinal:Negative for nausea and vomiting  Objective: Vital signs in last 24 hours: Temp:  [98.3 F (36.8 C)-98.5 F (36.9 C)] 98.5 F (36.9 C) (05/10 2352) Pulse Rate:  [93-95] 93 (05/10 2352) Resp:  [18] 18 (05/10 2352) BP: (141-142)/(63-68) 142/68 (05/10 2352) SpO2:  [95 %-96 %] 96 % (05/10 2352) Weight:  [117.9 kg (260 lb)] 117.9 kg (260 lb) (05/11 0430)  Intake/Output from previous day:  Intake/Output Summary (Last 24 hours) at 02/16/2018 0919 Last data filed at 02/15/2018 1100 Gross per 24 hour  Intake 388.75 ml  Output -  Net 388.75 ml    Intake/Output this shift: No intake/output data recorded.  Labs: Recent Labs    02/14/18 1157 02/14/18 1820 02/14/18 2028 02/15/18 0312 02/16/18 0410  HGB 12.2 12.1 12.2 11.1* 10.7*   Recent Labs    02/15/18 0312 02/16/18 0410  WBC 23.7* 22.8*  RBC 3.79* 3.67*  HCT 32.8* 31.8*  PLT 297 287   Recent Labs    02/14/18 1820 02/15/18 0312  NA 135 137  K 3.6 3.5  CL 101 104  CO2 24 26  BUN 14 15  CREATININE 0.80 0.72  GLUCOSE 104* 119*  CALCIUM 8.5* 8.4*   No results for input(s): LABPT, INR in the last 72 hours.   EXAM Physical Exam: General:  Alert, no acute distress Psychiatric:  Patient is competent for consent with normal mood and affect   Cardiovascular:  No pedal edema Respiratory:  No wheezing, non-labored breathing GI:  Abdomen is soft and non-tender Skin:  No lesions in the area of chief complaint Neurologic:  Sensation intact distally Lymphatic:  No axillary or cervical lymphadenopathy  Orthopedic Exam:  Orthopedic examination is limited to the right upper extremity and hand.  There is mild to moderate swelling over the radial aspect of the  hand both dorsally and palmarly involving the thenar eminence volarly and extending to the MCP joints both dorsally and palmarly.  Erythema is noted in this area as well.  She has moderate tenderness to palpation over the palmar and dorsal aspects of the radial aspect of her hand.  She is able to actively flex and extend all digits, and has no pain with passive motion of the thumb CMC or MCP joints.  She is able to freely move her wrist as well.  She is neurovascularly intact to her right hand.  Past Medical History:  Diagnosis Date  . Allergy    allergic rhinitis  . Angioedema 02/2006  . Arthritis    OA of knees  . Asthma   . Complication of anesthesia    apnea- sleep  . Frequent headaches   . Hiatal hernia   . Hypertension   . Hypothyroid   . Labile blood pressure   . Lower extremity edema   . Lung disease   . Pneumonia   . Pneumonia, eosinophilic ([facility])   . Shortness of breath dyspnea   . Sleep apnea   . Urinary incontinence   . Wheezing     Assessment/Plan:    Active Problems:   Cellulitis of finger of right hand  Estimated body mass index is 40.72 kg/m as calculated from the following:   Height as of this encounter:  (1.702  m).   Weight as of this encounter: 117.9 kg (260 lb).  According to patient, redness and swelling has improved. No signs of infectious tenosynovitis today. Continue IV ABx, waiting on results of blood cultures, at this time no growth present. No fevers this AM, WBC 22.8, improved compared to yesterday.  DVT Prophylaxis - Foot Pumps, TED hose and heparin  Valeria Batman, PA-C [facility] Orthopaedic Surgery 02/16/2018, 9:19 AM

## 2018-02-16 NOTE — Discharge Summary (Signed)
[facility] [place] [facility]   PATIENT NAME: Rachael Silva    MR#:  [id number]  DATE OF BIRTH:  1946-07-03  DATE OF ADMISSION:  02/14/2018 ADMITTING PHYSICIAN: Cammy Copa, MD  DATE OF DISCHARGE: 02/16/18  PRIMARY CARE PHYSICIAN: Anne Ng, NP    ADMISSION DIAGNOSIS:  Eosinophilia [D72.1] Hand pain, right [M79.641] Cellulitis of right hand [L03.113] Moderate persistent chronic asthma without complication [J45.40]  DISCHARGE DIAGNOSIS:  Active Problems:   Cellulitis of finger of right hand   SECONDARY DIAGNOSIS:   Past Medical History:  Diagnosis Date  . Allergy    allergic rhinitis  . Angioedema 02/2006  . Arthritis    OA of knees  . Asthma   . Complication of anesthesia    apnea- sleep  . Frequent headaches   . Hiatal hernia   . Hypertension   . Hypothyroid   . Labile blood pressure   . Lower extremity edema   . Lung disease   . Pneumonia   . Pneumonia, eosinophilic (HCC)   . Shortness of breath dyspnea   . Sleep apnea   . Urinary incontinence   . Wheezing     HOSPITAL COURSE:  HISTORY OF PRESENT ILLNESS: Rachael Silva  is a 72 y.o. female with a known history of allergic rhinitis, angioedema, osteoarthritis, asthma, hypertension, obstructive sleep apnea and hypothyroidism. Patient presented to emergency room for acute onset of right hand pain and swelling, going on for the past 2 days, gradually getting worse.  The pain and swelling are severe and involved the right second and third knuckle in the bottom of the right thumb.  Right hand has become warm to touch in the past 24 hours.  Patient denies any fever or chills.  No recent trauma.  Patient does recall, however that she was treated for possible cellulitis in her feet approximately 2 weeks ago with Keflex.  Patient first went to the primary care doctor earlier, today and had blood test done.  She was told that her WBC is very high and she was recommended to come to  emergency room.  Uric acid level was checked as well and it was normal. Blood test done emergency room show again elevated WBC at 24,000.  Sedimentation rate and C-reactive protein are normal. Troponin level is elevated at 0.05.  She denies any chest pain or shortness of breath.  EKG shows normal sinus rhythm, no acute ischemic changes. Right upper extremity ultrasound, done emergency room, reviewed by myself, shows subcutaneous soft tissue edema, nonspecific but likely related to cellulitis; no abscess. Patient is admitted for further evaluation and treatment  1.Acute right hand cellulitis,of unclear etiology. Clinically improving.  Seen by orthopedics no intervention is needed at this time   IV Zosyn and vancomycin will be changed to p.o. Augmentin and doxycycline for 10 days as recommended by infectious disease  blood cultures are negative so far WBCwith the chest to by mouth Augmentin and doxycycline for 10 days as recommended by infectious disease  blood cultures negative so Far 23.7----22.8 Ultrasound of the hand with soft tissue swelling  Okay to discharge patient from ID and orthopedic standpoint  2.   Elevated troponin secondary to demand ischemia from the ongoing infectious process  patient is a symptomatic Troponin level is slightly elevated at 0.05.This is likely related to acute infectious process  Patient started on aspirin   3.Hypertension, stable, will restart home medications.  4.OSA,will continue CPAP at night.     DISCHARGE  CONDITIONS:   STABLE  CONSULTS OBTAINED:  Treatment Team:  Christena Flake, MD Mick Sell, MD   PROCEDURES  NONE   DRUG ALLERGIES:   Allergies  Allergen Reactions  . Atorvastatin     Muscle pain   . Erythromycin     REACTION: feels sick    DISCHARGE MEDICATIONS:   Allergies as of 02/16/2018      Reactions   Atorvastatin    Muscle pain    Erythromycin    REACTION: feels sick      Medication List     STOP taking these medications   cephALEXin 500 MG capsule Commonly known as:  KEFLEX   indomethacin 50 MG capsule Commonly known as:  INDOCIN   metFORMIN 500 MG 24 hr tablet Commonly known as:  GLUCOPHAGE XR   potassium chloride SA 20 MEQ tablet Commonly known as:  K-DUR,KLOR-CON   Tiotropium Bromide Monohydrate 1.25 MCG/ACT Aers Commonly known as:  SPIRIVA RESPIMAT   traMADol 50 MG tablet Commonly known as:  ULTRAM     TAKE these medications   ADVAIR DISKUS 250-50 MCG/DOSE Aepb Generic drug:  Fluticasone-Salmeterol Inhale 1 puff into the lungs 2 (two) times daily.   albuterol 108 (90 Base) MCG/ACT inhaler Commonly known as:  VENTOLIN HFA TAKE 1 TO 2 PUFFS EVERY 6 HOURS AS NEEDED   albuterol 0.63 MG/3ML nebulizer solution Commonly known as:  ACCUNEB Take 3 mLs (0.63 mg total) by nebulization every 6 (six) hours as needed for wheezing or shortness of breath.   ALLEGRA ALLERGY 60 MG tablet Generic drug:  fexofenadine Take 60 mg by mouth daily.   amoxicillin-clavulanate 875-125 MG tablet Commonly known as:  AUGMENTIN Take 1 tablet by mouth every 12 (twelve) hours.   CALTRATE 600+D PO Take 1 tablet by mouth 2 (two) times daily.   CENTRUM ADULTS PO Take 1 tablet by mouth daily.   co-enzyme Q-10 30 MG capsule Take 200 mg by mouth daily.   diphenhydrAMINE 25 MG tablet Commonly known as:  BENADRYL ALLERGY Take 1 tablet (25 mg total) by mouth every 8 (eight) hours for 1 day, THEN 1 tablet (25 mg total) every 12 (twelve) hours for 2 days, THEN 1 tablet (25 mg total) every 8 (eight) hours as needed for up to 2 days for itching. Start taking on:  10/19/2017   docusate sodium 100 MG capsule Commonly known as:  COLACE Take 1 capsule (100 mg total) by mouth 2 (two) times daily.   doxycycline 100 MG tablet Commonly known as:  VIBRA-TABS Take 1 tablet (100 mg total) by mouth every 12 (twelve) hours.   EPIPEN 2-PAK 0.3 mg/0.3 mL Soaj injection Generic drug:   EPINEPHrine See admin instructions.   furosemide 20 MG tablet Commonly known as:  LASIX Take 0.5 tablets (10 mg total) by mouth daily.   GARLIC PO Take 1 capsule by mouth daily.   HYDROcodone-acetaminophen 5-325 MG tablet Commonly known as:  NORCO/VICODIN Take 1-2 tablets by mouth every 6 (six) hours as needed for moderate pain or severe pain.   levothyroxine 50 MCG tablet Commonly known as:  SYNTHROID, LEVOTHROID TAKE 1 TABLET BY MOUTH EVERY DAY   montelukast 10 MG tablet Commonly known as:  SINGULAIR TAKE 1 TABLET BY MOUTH EVERY DAY   omeprazole 40 MG capsule Commonly known as:  PRILOSEC Take 1 capsule (40 mg total) by mouth daily. What changed:  how much to take   ONE TOUCH ULTRA TEST test strip Generic drug:  glucose blood  USE TO TEST TWICE DAILY DX E09.9   ONETOUCH DELICA LANCETS 33G Misc USE TO TEST SUGARS TWICE DAILY DX E09.9   STRESS B COMPLEX PO Take 1 tablet by mouth daily.   triamcinolone ointment 0.1 % Commonly known as:  KENALOG Apply 1 application topically 2 (two) times daily.        DISCHARGE INSTRUCTIONS:   Follow-up with primary care physician in 5 to 7 days or sooner as needed Continue ice packs to the hand swelling  DIET:  Cardiac diet  DISCHARGE CONDITION:  Stable  ACTIVITY:  Activity as tolerated  OXYGEN:  Home Oxygen: No.   Oxygen Delivery: room air  DISCHARGE LOCATION:  home   If you experience worsening of your admission symptoms, develop shortness of breath, life threatening emergency, suicidal or homicidal thoughts you must seek medical attention immediately by calling 911 or calling your MD immediately  if symptoms less severe.  You Must read complete instructions/literature along with all the possible adverse reactions/side effects for all the Medicines you take and that have been prescribed to you. Take any new Medicines after you have completely understood and accpet all the possible adverse reactions/side effects.    Please note  You were cared for by a hospitalist during your hospital stay. If you have any questions about your discharge medications or the care you received while you were in the hospital after you are discharged, you can call the unit and asked to speak with the hospitalist on call if the hospitalist that took care of you is not available. Once you are discharged, your primary care physician will handle any further medical issues. Please note that NO REFILLS for any discharge medications will be authorized once you are discharged, as it is imperative that you return to your primary care physician (or establish a relationship with a primary care physician if you do not have one) for your aftercare needs so that they can reassess your need for medications and monitor your lab values.     Today  Chief Complaint  Patient presents with  . Abnormal Lab    Hand swelling is better, ok to d/c pt from ID and Ortho standpoint  ROS:  CONSTITUTIONAL: Denies fevers, chills. Denies any fatigue, weakness.  EYES: Denies blurry vision, double vision, eye pain. EARS, NOSE, THROAT: Denies tinnitus, ear pain, hearing loss. RESPIRATORY: Denies cough, wheeze, shortness of breath.  CARDIOVASCULAR: Denies chest pain, palpitations, edema.  GASTROINTESTINAL: Denies nausea, vomiting, diarrhea, abdominal pain. Denies bright red blood per rectum. GENITOURINARY: Denies dysuria, hematuria. ENDOCRINE: Denies nocturia or thyroid problems. HEMATOLOGIC AND LYMPHATIC: Denies easy bruising or bleeding. SKIN: Denies rash or lesion. MUSCULOSKELETAL: Left hand swelling improving denies pain in neck, back, shoulder, knees, hips or arthritic symptoms.  NEUROLOGIC: Denies paralysis, paresthesias.  PSYCHIATRIC: Denies anxiety or depressive symptoms.   VITAL SIGNS:  Blood pressure (!) 156/69, pulse 95, temperature 98.3 F (36.8 C), temperature source Oral, resp. rate 18, height  (1.702 m), weight 117.9 kg (260 lb),  SpO2 96 %.  I/O:    Intake/Output Summary (Last 24 hours) at 02/16/2018 1107 Last data filed at 02/16/2018 1030 Gross per 24 hour  Intake 0 ml  Output -  Net 0 ml    PHYSICAL EXAMINATION:  GENERAL:  72 y.o.-year-old patient lying in the bed with no acute distress.  EYES: Pupils equal, round, reactive to light and accommodation. No scleral icterus. Extraocular muscles intact.  HEENT: Head atraumatic, normocephalic. Oropharynx and nasopharynx clear.  NECK:  Supple, no jugular venous distention. No thyroid enlargement, no tenderness.  LUNGS: Normal breath sounds bilaterally, no wheezing, rales,rhonchi or crepitation. No use of accessory muscles of respiration.  CARDIOVASCULAR: S1, S2 normal. No murmurs, rubs, or gallops.  ABDOMEN: Soft, non-tender, non-distended. Bowel sounds present. No organomegaly or mass.  EXTREMITIES: Left hand palmar aspect with minimal edema and tenderness no discharge no pedal edema, cyanosis, or clubbing.  NEUROLOGIC: Cranial nerves II through XII are intact. Muscle strength 5/5 in all extremities. Sensation intact. Gait not checked.  PSYCHIATRIC: The patient is alert and oriented x 3.  SKIN: No obvious rash, lesion, or ulcer.   DATA REVIEW:   CBC Recent Labs  Lab 02/16/18 0410  WBC 22.8*  HGB 10.7*  HCT 31.8*  PLT 287    Chemistries  Recent Labs  Lab 02/15/18 0312  NA 137  K 3.5  CL 104  CO2 26  GLUCOSE 119*  BUN 15  CREATININE 0.72  CALCIUM 8.4*    Cardiac Enzymes Recent Labs  Lab 02/16/18 0410  TROPONINI 0.04*    Microbiology Results  Results for orders placed or performed during the hospital encounter of 02/14/18  Blood Culture (routine x 2)     Status: None (Preliminary result)   Collection Time: 02/14/18  8:28 PM  Result Value Ref Range Status   Specimen Description BLOOD LEFT FA  Final   Special Requests   Final    BOTTLES DRAWN AEROBIC AND ANAEROBIC Blood Culture adequate volume   Culture   Final    NO GROWTH 2  DAYS Performed at [facility], [address] [place], [place] [address]    Report Status PENDING  Incomplete  Blood Culture (routine x 2)     Status: None (Preliminary result)   Collection Time: 02/14/18  8:28 PM  Result Value Ref Range Status   Specimen Description BLOOD RIGHT [facility]  Final   Special Requests   Final    BOTTLES DRAWN AEROBIC AND ANAEROBIC Blood Culture adequate volume   Culture   Final    NO GROWTH 2 DAYS Performed at [facility], [address] [place] [address], [place], [place] [address]    Report Status PENDING  Incomplete    RADIOLOGY:  Dg Hand Complete Right  Result Date: 02/14/2018 CLINICAL DATA:  Swelling.  Pain.  No known injury. EXAM: RIGHT HAND - COMPLETE 3+ VIEW COMPARISON:  None. FINDINGS: There is no evidence of fracture or dislocation. There is no evidence of erosive arthropathy or other focal bone abnormality. Mild soft tissue swelling. Degenerative change PIP and DIP joints consistent with osteoarthritis. IMPRESSION: No fracture or erosive arthropathy. Electronically Signed   By: Elsie Stain M.D.   On: 02/14/2018 20:22   [place] Rt Upper Extrem Ltd Soft Tissue Non Vascular  Result Date: 02/14/2018 CLINICAL DATA:  Erythema and swelling along the palm of the right hand near the thumb. EXAM: ULTRASOUND RIGHT UPPER EXTREMITY LIMITED TECHNIQUE: Ultrasound examination of the upper extremity soft tissues was performed in the area of clinical concern. COMPARISON:  Right hand radiographs from 02/14/2017 FINDINGS: Targeted study of the right palm near the thumb was performed over the area of swelling. Mild subcutaneous soft tissue edema is seen along the palmar aspect of the right hand in the area imaged. No drainable fluid collection is seen. IMPRESSION: Subcutaneous soft tissue edema, nonspecific but likely related to cellulitis. Electronically Signed   By: Tollie Eth M.D.   On: 02/14/2018 22:06    EKG:   Orders  placed or performed during the hospital encounter  of 02/14/18  . ED EKG  . ED EKG  . EKG 12-Lead  . EKG 12-Lead      Management plans discussed with the patient, family and they are in agreement.  CODE STATUS:     Code Status Orders  (From admission, onward)        Start     Ordered   02/15/18 0010  Full code  Continuous     02/15/18 0009    Code Status History    This patient has a current code status but no historical code status.      TOTAL TIME TAKING CARE OF THIS PATIENT: 45 minutes.   Note: This dictation was prepared with Dragon dictation along with smaller phrase technology. Any transcriptional errors that result from this process are unintentional.   @  on 02/16/2018 at 11:07 AM  Between 7am to 6pm - Pager - [phone]  After 6pm go to [email or web address] - password [email or web address]  [employer] [place] [facility]  Office  [phone]  CC: Primary care physician; Nche, Bonna Gains, NP

## 2018-02-16 NOTE — Discharge Instructions (Signed)
Follow-up with primary care physician in 5 to 7 days or sooner as needed

## 2018-02-18 ENCOUNTER — Telehealth: Payer: Self-pay | Admitting: Behavioral Health

## 2018-02-18 ENCOUNTER — Ambulatory Visit: Payer: Medicare Other | Admitting: Family Medicine

## 2018-02-18 NOTE — Telephone Encounter (Signed)
Transition Care Management Follow-up Telephone Call  DATE OF ADMISSION:  02/14/2018      ADMITTING PHYSICIAN: Cammy Copa, MD  DATE OF DISCHARGE: 02/16/18  PRIMARY CARE PHYSICIAN: Anne Ng, NP     How have you been since you were released from the hospital? Patient stated, "I've been okay; the swelling is going down & the pain has lessened."   Do you understand why you were in the hospital? yes   Do you understand the discharge instructions? yes   Where were you discharged to? Home   Items Reviewed:  Medications reviewed: yes  Allergies reviewed: yes  Dietary changes reviewed: yes, low carb & sodium diet.  Referrals reviewed: yes, Follow-up with primary care physician in 5 to 7 days or sooner as needed.   Functional Questionnaire:   Activities of Daily Living (ADLs):   She states they are independent in the following: ambulation, bathing and hygiene, feeding, continence, grooming, toileting and dressing States they require assistance with the following: None   Any transportation issues/concerns?: yes, patient voiced that her daughter will transport her to the appointment d/t hand swelling.   Any patient concerns? no   Confirmed importance and date/time of follow-up visits scheduled yes, 02/21/18 at 10 AM.  Provider Appointment booked with Dr. Ashley Royalty.  Confirmed with patient if condition begins to worsen call PCP or go to the ER.  Patient was given the office number and encouraged to call back with question or concerns.  : yes

## 2018-02-19 LAB — CULTURE, BLOOD (ROUTINE X 2)
CULTURE: NO GROWTH
Culture: NO GROWTH
SPECIAL REQUESTS: ADEQUATE
Special Requests: ADEQUATE

## 2018-02-21 ENCOUNTER — Encounter: Payer: Self-pay | Admitting: Family Medicine

## 2018-02-21 ENCOUNTER — Ambulatory Visit (INDEPENDENT_AMBULATORY_CARE_PROVIDER_SITE_OTHER): Payer: Medicare Other | Admitting: Family Medicine

## 2018-02-21 ENCOUNTER — Telehealth: Payer: Self-pay | Admitting: Emergency Medicine

## 2018-02-21 ENCOUNTER — Telehealth: Payer: Self-pay

## 2018-02-21 VITALS — BP 150/70 | HR 99 | Temp 98.1°F | Wt 254.2 lb

## 2018-02-21 DIAGNOSIS — L03011 Cellulitis of right finger: Secondary | ICD-10-CM

## 2018-02-21 DIAGNOSIS — D721 Eosinophilia, unspecified: Secondary | ICD-10-CM

## 2018-02-21 DIAGNOSIS — R7989 Other specified abnormal findings of blood chemistry: Secondary | ICD-10-CM

## 2018-02-21 DIAGNOSIS — R778 Other specified abnormalities of plasma proteins: Secondary | ICD-10-CM

## 2018-02-21 LAB — CBC WITH DIFFERENTIAL/PLATELET
Basophils Absolute: 0.1 10*3/uL (ref 0.0–0.1)
Basophils Relative: 0.4 % (ref 0.0–3.0)
Eosinophils Absolute: 15.9 10*3/uL — ABNORMAL HIGH (ref 0.0–0.7)
Eosinophils Relative: 69.2 % — ABNORMAL HIGH (ref 0.0–5.0)
HEMATOCRIT: 33.4 % — AB (ref 36.0–46.0)
Hemoglobin: 11.1 g/dL — ABNORMAL LOW (ref 12.0–15.0)
LYMPHS PCT: 12.7 % (ref 12.0–46.0)
Lymphs Abs: 2.9 10*3/uL (ref 0.7–4.0)
MCHC: 33.4 g/dL (ref 30.0–36.0)
MCV: 85.6 fl (ref 78.0–100.0)
MONOS PCT: 2.9 % — AB (ref 3.0–12.0)
Monocytes Absolute: 0.7 10*3/uL (ref 0.1–1.0)
NEUTROS PCT: 14.8 % — AB (ref 43.0–77.0)
Neutro Abs: 3.4 10*3/uL (ref 1.4–7.7)
Platelets: 325 10*3/uL (ref 150.0–400.0)
RBC: 3.9 Mil/uL (ref 3.87–5.11)
RDW: 14.7 % (ref 11.5–15.5)

## 2018-02-21 NOTE — Telephone Encounter (Signed)
Critical lab report from Clydie Braun at [facility]. WBC 23,000. Dr.Matthews and Torrie were out of the office. I reported critical lab to Dr. Doreene Burke. AW

## 2018-02-21 NOTE — Assessment & Plan Note (Addendum)
It is concerning that she has developed what appears to be cellulitis at two separate sites in the past few weeks.  Blood cultures reviewed and negative. ? Dermatologic manifestation of eosinophilia such as eosinophilc granulomas.   Would favor more of infectious etiology as Symptoms have improved with antibiotics and recommend that she complete course of augmentin.   She will notify our office of any changing/worsening symptoms.

## 2018-02-21 NOTE — Telephone Encounter (Signed)
Attempted to contact patient. Phone continuously rang and no answer. Will try to contact patient again.

## 2018-02-21 NOTE — Progress Notes (Signed)
Rachael Silva - 72 y.o. female MRN [id number]  Date of birth: [birth year]  Subjective Chief Complaint  Patient presents with  . Hospitalization Follow-up    HPI Rachael Silva is a 72 y.o. female with with history of hypothyroidism, GERD and HLD here today for hospital follow up.  She was recently admitted  from 02/14/18-02/16/18 at [facility] for diagnosis of cellulitis of the R hand.  She was treated initially with vancomycin and zosyn and transitioned to augmentin at time of discharge.  She reported that the pain in her hand and swelling has improved significantly.  Her WBC was significantly elevated at time of admission with eosinophilia.  She also had persistently elevated troponins which were attributed to strain due to infection.  EKG was completed and did not show signs of ischemia.  Since discharge she is doing fairly well, still with mild swelling of the hand however denies pain at this time.  She has been afebrile and denies chest pain, sob, palpitations or dizziness.    ROS: ROS completed and negative except as noted per HPI  Allergies  Allergen Reactions  . Atorvastatin     Muscle pain   . Erythromycin     REACTION: feels sick    Past Medical History:  Diagnosis Date  . Allergy    allergic rhinitis  . Angioedema 02/2006  . Arthritis    OA of knees  . Asthma   . Complication of anesthesia    apnea- sleep  . Frequent headaches   . Hiatal hernia   . Hypertension   . Hypothyroid   . Labile blood pressure   . Lower extremity edema   . Lung disease   . Pneumonia   . Pneumonia, eosinophilic (HCC)   . Shortness of breath dyspnea   . Sleep apnea   . Urinary incontinence   . Wheezing     Past Surgical History:  Procedure Laterality Date  . BREAST BIOPSY Left 2013   NEG  . BREAST SURGERY  1966   breast biopsy  . CESAREAN SECTION     x2  . FLEXIBLE BRONCHOSCOPY N/A 09/16/2015   Procedure: FLEXIBLE BRONCHOSCOPY WITH FLURO ;  Surgeon: Stephanie Acre, MD;  Location: [facility]  ORS;  Service: Pulmonary;  Laterality: N/A;    Social History   Socioeconomic History  . Marital status: Single    Spouse name: Not on file  . Number of children: 2  . Years of education: Not on file  . Highest education level: Not on file  Occupational History  . Occupation: retired    Associate Professor: RETIRED  Social Needs  . Financial resource strain: Not on file  . Food insecurity:    Worry: Not on file    Inability: Not on file  . Transportation needs:    Medical: Not on file    Non-medical: Not on file  Tobacco Use  . Smoking status: Never Smoker  . Smokeless tobacco: Never Used  Substance and Sexual Activity  . Alcohol use: No    Alcohol/week: 0.0 oz  . Drug use: No  . Sexual activity: Never  Lifestyle  . Physical activity:    Days per week: Not on file    Minutes per session: Not on file  . Stress: Not on file  Relationships  . Social connections:    Talks on phone: Not on file    Gets together: Not on file    Attends religious service: Not on file    Active member of club  or organization: Not on file    Attends meetings of clubs or organizations: Not on file    Relationship status: Not on file  Other Topics Concern  . Not on file  Social History Narrative  . Not on file    Family History  Problem Relation Age of Onset  . Diabetes Mother        pre-diabetic  . Heart disease Mother        ? heart disease  . Emphysema Mother   . Thyroid disease Mother   . Diabetes Maternal Uncle   . Breast cancer Neg Hx     Health Maintenance  Topic Date Due  . URINE MICROALBUMIN  11/22/2017  . FOOT EXAM  12/01/2017  . TETANUS/TDAP  10/04/2018 (Originally 10/09/2013)  . INFLUENZA VACCINE  05/09/2018  . OPHTHALMOLOGY EXAM  05/15/2018  . HEMOGLOBIN A1C  06/22/2018  . COLONOSCOPY  07/20/2018  . MAMMOGRAM  12/21/2018  . DEXA SCAN  Completed  . Hepatitis C Screening  Completed  . PNA vac Low Risk Adult  Completed     ----------------------------------------------------------------------------------------------------------------------------------------------------------------------------------------------------------------- Physical Exam BP (!) 150/70 (BP Location: Right Arm, Patient Position: Sitting, Cuff Size: Large)   Pulse 99   Temp 98.1 F (36.7 C) (Oral)   Wt 254 lb 3.2 oz (115.3 kg)   BMI 39.81 kg/m   Physical Exam  Constitutional: She appears well-nourished. No distress.  HENT:  Head: Normocephalic and atraumatic.  Mouth/Throat: Oropharynx is clear and moist.  Neck: Neck supple.  Cardiovascular: Normal rate, regular rhythm and normal heart sounds.  Pulmonary/Chest: Effort normal and breath sounds normal.  Skin:  Mild swelling of thenar eminence of R hand, mild bruising.  Area is non-tender.  She has good rom of finger and thumb.    Psychiatric: She has a normal mood and affect. Her behavior is normal.    ------------------------------------------------------------------------------------------------------------------------------------------------------------------------------------------------------------------- Assessment and Plan  Cellulitis of finger of right hand It is concerning that she has developed what appears to be cellulitis at two separate sites in the past few weeks.  Blood cultures reviewed and negative. ? Dermatologic manifestation of eosinophilia such as eosinophilc granulomas.   Would favor more of infectious etiology as Symptoms have improved with antibiotics and recommend that she complete course of augmentin.   She will notify our office of any changing/worsening symptoms.       Eosinophilia Significant eosinophilia on prior CBC.  Troponin elevated in hospital, ? Cardiac involvement from eosinophilia/?Possible progression to Churg-Strauss given history of eosinophilic PNA. Recheck CBC with diff today.  If eosinophils remains elevated would recommend  echocardiogram given previous troponin elevation and hematology referral.

## 2018-02-21 NOTE — Telephone Encounter (Signed)
Spoke with Melissa from [facility] regarding patient STAT referral. Efraim Kaufmann stated that she is currently working on that referral and will contact the patient today.

## 2018-02-21 NOTE — Telephone Encounter (Signed)
Please let patient know that her WBC are still very elevated at 23,000.  Eosinophils are higher than previous at 15,900.  Would recommend hematology evaluation as we discussed today.  I would also recommend an echocardiogram of her heart as her cardiac enzymes were elevated in the hospital and elevated eosinophils can sometimes affect the heart muscle.

## 2018-02-21 NOTE — Telephone Encounter (Signed)
Spoke with patient regarding lab results and Dr. Ashley Royalty recommendations. Patient understood to be expecting a call from Hematology and also someone will contact her to set up an Echocardiogram appointment. Patient understood and had no further questions.

## 2018-02-21 NOTE — Assessment & Plan Note (Addendum)
Significant eosinophilia on prior CBC.  Troponin elevated in hospital, ? Cardiac involvement from eosinophilia/?Possible progression to Churg-Strauss given history of eosinophilic PNA. Recheck CBC with diff today.  If eosinophils remains elevated would recommend echocardiogram given previous troponin elevation and hematology referral.

## 2018-02-21 NOTE — Patient Instructions (Signed)
Complete antibiotics Please let me know if anything changes or worsens We'll let you know when blood test results return.

## 2018-02-24 NOTE — Progress Notes (Signed)
[place]  [phone] Fax:(336) [phone]  ID: Rachael Silva OB: [birth year]  MR#: [id number]  EQA#:[id number]  Patient Care Team: Flossie Buffy, NP as PCP - General (Internal Medicine)  CHIEF COMPLAINT: Eosinophilia  INTERVAL HISTORY: Patient is a 72 year old female who was noted to have a chronically elevated white blood cell count with eosinophilia predominance.  She has a history of asthma and allergies, but is currently asymptomatic.  She has no neurologic complaints.  She denies any recent fevers or illnesses.  She has a good appetite and denies weight loss.  She denies any night sweats.  She has no chest pain or shortness of breath.  She denies any nausea, vomiting, constipation, or diarrhea.  She has no urinary complaints.  Patient feels at her baseline offers no specific complaints today.  REVIEW OF SYSTEMS:   Review of Systems  Constitutional: Negative.  Negative for fever, malaise/fatigue and weight loss.  Respiratory: Negative.  Negative for cough and shortness of breath.   Cardiovascular: Negative.  Negative for chest pain and leg swelling.  Gastrointestinal: Negative.  Negative for abdominal pain.  Genitourinary: Negative.  Negative for dysuria.  Musculoskeletal: Negative.  Negative for myalgias.  Skin: Negative.  Negative for rash.  Neurological: Negative.  Negative for sensory change, focal weakness and weakness.  Endo/Heme/Allergies: Positive for environmental allergies.  Psychiatric/Behavioral: The patient is nervous/anxious.     As per HPI. Otherwise, a complete review of systems is negative.  PAST MEDICAL HISTORY: Past Medical History:  Diagnosis Date  . Allergy    allergic rhinitis  . Angioedema 02/2006  . Arthritis    OA of knees  . Asthma   . Complication of anesthesia    apnea- sleep  . Frequent headaches   . Hiatal hernia   . Hypertension   . Hypothyroid   . Labile blood pressure   . Lower extremity edema     . Lung disease   . Pneumonia   . Pneumonia, eosinophilic ([place])   . Shortness of breath dyspnea   . Sleep apnea   . Urinary incontinence   . Wheezing     PAST SURGICAL HISTORY: Past Surgical History:  Procedure Laterality Date  . BREAST BIOPSY Left 2013   NEG  . BREAST SURGERY  1966   breast biopsy  . CESAREAN SECTION     x2  . FLEXIBLE BRONCHOSCOPY N/A 09/16/2015   Procedure: FLEXIBLE BRONCHOSCOPY WITH FLURO ;  Surgeon: Vilinda Boehringer, MD;  Location: [facility] ORS;  Service: Pulmonary;  Laterality: N/A;    FAMILY HISTORY: Family History  Problem Relation Age of Onset  . Diabetes Mother        pre-diabetic  . Heart disease Mother        ? heart disease  . Emphysema Mother   . Thyroid disease Mother   . Diabetes Maternal Uncle   . Heart disease Father   . Breast cancer Neg Hx     ADVANCED DIRECTIVES (Y/N):  N  HEALTH MAINTENANCE: Social History   Tobacco Use  . Smoking status: Never Smoker  . Smokeless tobacco: Never Used  Substance Use Topics  . Alcohol use: No    Alcohol/week: 0.0 oz  . Drug use: No     Colonoscopy:  PAP:  Bone density:  Lipid panel:  Allergies  Allergen Reactions  . Atorvastatin     Muscle pain   . Erythromycin     REACTION: feels sick    Current Outpatient Medications  Medication Sig  Dispense Refill  . albuterol (VENTOLIN HFA) 108 (90 Base) MCG/ACT inhaler TAKE 1 TO 2 PUFFS EVERY 6 HOURS AS NEEDED 18 g 11  . amoxicillin-clavulanate (AUGMENTIN) 875-125 MG tablet Take 1 tablet by mouth 2 (two) times daily.    . B Complex-C-Folic Acid (STRESS B COMPLEX PO) Take 1 tablet by mouth daily.    . Calcium Carbonate-Vitamin D (CALTRATE 600+D PO) Take 1 tablet by mouth 2 (two) times daily.    Marland Kitchen co-enzyme Q-10 30 MG capsule Take 200 mg by mouth daily.    Marland Kitchen docusate sodium (COLACE) 100 MG capsule Take 1 capsule (100 mg total) by mouth 2 (two) times daily. 10 capsule 0  . doxycycline (VIBRA-TABS) 100 MG tablet Take 1 tablet (100 mg total) by mouth  every 12 (twelve) hours. 20 tablet 0  . doxycycline (VIBRAMYCIN) 100 MG capsule Take by mouth every 12 (twelve) hours.  0  . EPIPEN 2-PAK 0.3 MG/0.3ML SOAJ injection See admin instructions.  99  . fexofenadine (ALLEGRA ALLERGY) 60 MG tablet Take 60 mg by mouth daily.     . Fluticasone-Salmeterol (ADVAIR DISKUS) 250-50 MCG/DOSE AEPB Inhale 1 puff into the lungs 2 (two) times daily.     . furosemide (LASIX) 20 MG tablet Take 0.5 tablets (10 mg total) by mouth daily. 30 tablet 1  . GARLIC PO Take 1 capsule by mouth daily.    Marland Kitchen HYDROcodone-acetaminophen (NORCO/VICODIN) 5-325 MG tablet Take 1-2 tablets by mouth every 6 (six) hours as needed for moderate pain or severe pain. 30 tablet 0  . levothyroxine (SYNTHROID, LEVOTHROID) 50 MCG tablet TAKE 1 TABLET BY MOUTH EVERY DAY 30 tablet 2  . montelukast (SINGULAIR) 10 MG tablet TAKE 1 TABLET BY MOUTH EVERY DAY 90 tablet 1  . Multiple Vitamins-Minerals (CENTRUM ADULTS PO) Take 1 tablet by mouth daily.    Marland Kitchen omeprazole (PRILOSEC) 40 MG capsule Take 1 capsule (40 mg total) by mouth daily. (Patient taking differently: Take 20 mg by mouth daily. ) 30 capsule 5  . albuterol (ACCUNEB) 0.63 MG/3ML nebulizer solution Take 3 mLs (0.63 mg total) by nebulization every 6 (six) hours as needed for wheezing or shortness of breath. (Patient not taking: Reported on 02/25/2018) 75 mL 0  . diphenhydrAMINE (BENADRYL ALLERGY) 25 MG tablet Take 1 tablet (25 mg total) by mouth every 8 (eight) hours for 1 day, THEN 1 tablet (25 mg total) every 12 (twelve) hours for 2 days, THEN 1 tablet (25 mg total) every 8 (eight) hours as needed for up to 2 days for itching. 30 tablet 0  . ONE TOUCH ULTRA TEST test strip USE TO TEST TWICE DAILY DX E09.9 (Patient not taking: Reported on 02/25/2018) 100 each 0  . ONETOUCH DELICA LANCETS 45W MISC USE TO TEST SUGARS TWICE DAILY DX E09.9 (Patient not taking: Reported on 02/25/2018) 100 each 0  . triamcinolone ointment (KENALOG) 0.1 % Apply 1 application  topically 2 (two) times daily. (Patient not taking: Reported on 02/25/2018) 80 g 0   Current Facility-Administered Medications  Medication Dose Route Frequency Provider Last Rate Last Dose  . omalizumab Arvid Right) injection 375 mg  375 mg Subcutaneous Q14 Days Flora Lipps, MD   375 mg at 08/16/17 1249  . omalizumab Arvid Right) injection 375 mg  375 mg Subcutaneous Q14 Days Flora Lipps, MD   375 mg at 09/25/17 1638  . omalizumab Arvid Right) injection 375 mg  375 mg Subcutaneous Q14 Days Flora Lipps, MD   375 mg at 10/11/17 1625    OBJECTIVE:  Vitals:   02/25/18 0908  BP: 136/82  Pulse: (!) 106  Resp: 20  Temp: 98.1 F (36.7 C)     Body mass index is 39.55 kg/m.    ECOG FS:0 - Asymptomatic  General: Well-developed, well-nourished, no acute distress. Eyes: Pink conjunctiva, anicteric sclera. HEENT: Normocephalic, moist mucous membranes, clear oropharnyx. Lungs: Clear to auscultation bilaterally. Heart: Regular rate and rhythm. No rubs, murmurs, or gallops. Abdomen: Soft, nontender, nondistended. No organomegaly noted, normoactive bowel sounds. Musculoskeletal: No edema, cyanosis, or clubbing. Neuro: Alert, answering all questions appropriately. Cranial nerves grossly intact. Skin: No rashes or petechiae noted. Psych: Normal affect. Lymphatics: No cervical, calvicular, axillary or inguinal LAD.   LAB RESULTS:  Lab Results  Component Value Date   NA 138 02/25/2018   K 3.5 02/25/2018   CL 106 02/25/2018   CO2 24 02/25/2018   GLUCOSE 102 (H) 02/25/2018   BUN 9 02/25/2018   CREATININE 0.80 02/25/2018   CALCIUM 9.3 02/25/2018   PROT 7.8 02/25/2018   ALBUMIN 3.1 (L) 02/25/2018   AST 60 (H) 02/25/2018   ALT 61 (H) 02/25/2018   ALKPHOS 277 (H) 02/25/2018   BILITOT 0.7 02/25/2018   GFRNONAA >60 02/25/2018   GFRAA >60 02/25/2018    Lab Results  Component Value Date   WBC 25.5 (H) 02/25/2018   NEUTROABS 5.4 02/25/2018   HGB 11.7 (L) 02/25/2018   HCT 35.3 02/25/2018   MCV 86.6  02/25/2018   PLT 347 02/25/2018     STUDIES: Dg Hand Complete Right  Result Date: 02/14/2018 CLINICAL DATA:  Swelling.  Pain.  No known injury. EXAM: RIGHT HAND - COMPLETE 3+ VIEW COMPARISON:  None. FINDINGS: There is no evidence of fracture or dislocation. There is no evidence of erosive arthropathy or other focal bone abnormality. Mild soft tissue swelling. Degenerative change PIP and DIP joints consistent with osteoarthritis. IMPRESSION: No fracture or erosive arthropathy. Electronically Signed   By: Staci Righter M.D.   On: 02/14/2018 20:22   [place] Rt Upper Extrem Ltd Soft Tissue Non Vascular  Result Date: 02/14/2018 CLINICAL DATA:  Erythema and swelling along the palm of the right hand near the thumb. EXAM: ULTRASOUND RIGHT UPPER EXTREMITY LIMITED TECHNIQUE: Ultrasound examination of the upper extremity soft tissues was performed in the area of clinical concern. COMPARISON:  Right hand radiographs from 02/14/2017 FINDINGS: Targeted study of the right palm near the thumb was performed over the area of swelling. Mild subcutaneous soft tissue edema is seen along the palmar aspect of the right hand in the area imaged. No drainable fluid collection is seen. IMPRESSION: Subcutaneous soft tissue edema, nonspecific but likely related to cellulitis. Electronically Signed   By: Ashley Royalty M.D.   On: 02/14/2018 22:06    ASSESSMENT: Eosinophilia  PLAN:    1. Eosinophilia: Patient continues to have an elevated white blood cell count with eosinophilic predominance.  She has a mildly elevated IgG level, but IgA and IgM are within normal limits.  IgE is pending at time of dictation.  Much of the work-up is also pending including peripheral blood flow cytometry, Jak 2 mutation, BCR-ABL mutation, and ANA.  No intervention is needed at this time.  Will consider a bone marrow biopsy in the future if necessary.  Return to clinic in 4 weeks for repeat laboratory work and further diagnostic planning if  necessary.  Approximately 60 minutes was spent in discussion of which greater than 50% was consultation.  Patient expressed understanding and was in agreement with this  plan. She also understands that She can call clinic at any time with any questions, concerns, or complaints.   Cancer Staging No matching staging information was found for the patient.  Lloyd Huger, MD   02/26/2018 1:33 PM

## 2018-02-25 ENCOUNTER — Inpatient Hospital Stay: Payer: Medicare Other

## 2018-02-25 ENCOUNTER — Encounter: Payer: Self-pay | Admitting: Oncology

## 2018-02-25 ENCOUNTER — Other Ambulatory Visit: Payer: Self-pay | Admitting: Family Medicine

## 2018-02-25 ENCOUNTER — Inpatient Hospital Stay: Payer: Medicare Other | Attending: Oncology | Admitting: Oncology

## 2018-02-25 ENCOUNTER — Other Ambulatory Visit: Payer: Self-pay

## 2018-02-25 VITALS — BP 136/82 | HR 106 | Temp 98.1°F | Resp 20 | Ht 66.93 in | Wt 252.0 lb

## 2018-02-25 DIAGNOSIS — D721 Eosinophilia, unspecified: Secondary | ICD-10-CM

## 2018-02-25 DIAGNOSIS — R0600 Dyspnea, unspecified: Secondary | ICD-10-CM

## 2018-02-25 DIAGNOSIS — Z79899 Other long term (current) drug therapy: Secondary | ICD-10-CM | POA: Diagnosis not present

## 2018-02-25 DIAGNOSIS — I10 Essential (primary) hypertension: Secondary | ICD-10-CM

## 2018-02-25 DIAGNOSIS — G4730 Sleep apnea, unspecified: Secondary | ICD-10-CM | POA: Diagnosis not present

## 2018-02-25 DIAGNOSIS — E039 Hypothyroidism, unspecified: Secondary | ICD-10-CM

## 2018-02-25 DIAGNOSIS — D72829 Elevated white blood cell count, unspecified: Secondary | ICD-10-CM | POA: Insufficient documentation

## 2018-02-25 DIAGNOSIS — J45909 Unspecified asthma, uncomplicated: Secondary | ICD-10-CM

## 2018-02-25 DIAGNOSIS — M1990 Unspecified osteoarthritis, unspecified site: Secondary | ICD-10-CM | POA: Diagnosis not present

## 2018-02-25 DIAGNOSIS — R778 Other specified abnormalities of plasma proteins: Secondary | ICD-10-CM

## 2018-02-25 DIAGNOSIS — R7989 Other specified abnormal findings of blood chemistry: Secondary | ICD-10-CM

## 2018-02-25 LAB — CBC WITH DIFFERENTIAL/PLATELET
BASOS ABS: 0.3 10*3/uL — AB (ref 0–0.1)
Basophils Relative: 1 %
EOS PCT: 66 %
Eosinophils Absolute: 16.7 10*3/uL — ABNORMAL HIGH (ref 0–0.7)
HCT: 35.3 % (ref 35.0–47.0)
HEMOGLOBIN: 11.7 g/dL — AB (ref 12.0–16.0)
LYMPHS ABS: 2.6 10*3/uL (ref 1.0–3.6)
Lymphocytes Relative: 10 %
MCH: 28.9 pg (ref 26.0–34.0)
MCHC: 33.3 g/dL (ref 32.0–36.0)
MCV: 86.6 fL (ref 80.0–100.0)
MONOS PCT: 2 %
Monocytes Absolute: 0.5 10*3/uL (ref 0.2–0.9)
NEUTROS ABS: 5.4 10*3/uL (ref 1.4–6.5)
Neutrophils Relative %: 21 %
Platelets: 347 10*3/uL (ref 150–440)
RBC: 4.07 MIL/uL (ref 3.80–5.20)
RDW: 15.4 % — ABNORMAL HIGH (ref 11.5–14.5)
WBC: 25.5 10*3/uL — ABNORMAL HIGH (ref 4.0–10.5)

## 2018-02-25 LAB — COMPREHENSIVE METABOLIC PANEL
ALBUMIN: 3.1 g/dL — AB (ref 3.5–5.0)
ALT: 61 U/L — ABNORMAL HIGH (ref 14–54)
AST: 60 U/L — AB (ref 15–41)
Alkaline Phosphatase: 277 U/L — ABNORMAL HIGH (ref 38–126)
Anion gap: 8 (ref 5–15)
BILIRUBIN TOTAL: 0.7 mg/dL (ref 0.3–1.2)
BUN: 9 mg/dL (ref 6–20)
CO2: 24 mmol/L (ref 22–32)
Calcium: 9.3 mg/dL (ref 8.9–10.3)
Chloride: 106 mmol/L (ref 101–111)
Creatinine, Ser: 0.8 mg/dL (ref 0.44–1.00)
GFR calc Af Amer: >60 mL/min (ref >60)
GFR calc non Af Amer: >60 mL/min (ref >60)
GLUCOSE: 102 mg/dL — AB (ref 65–99)
POTASSIUM: 3.5 mmol/L (ref 3.5–5.1)
Sodium: 138 mmol/L (ref 135–145)
TOTAL PROTEIN: 7.8 g/dL (ref 6.5–8.1)

## 2018-02-25 LAB — FOLATE: Folate: 20.7 ng/mL (ref >5.9)

## 2018-02-25 NOTE — Progress Notes (Signed)
Here for new pt evaluation. Per pt R hand pad edema- stated d/c for hospital  on 5/11 after being there 2 d  Stated here to evaluate "problems w my WBC "  Pt diaphoretic as she walked to the room ( lasting approx 10 min ) denied pain. Pulse ox 93 % on RA.

## 2018-02-26 LAB — IGG, IGA, IGM
IGA: 222 mg/dL (ref 87–352)
IGM (IMMUNOGLOBULIN M), SRM: 110 mg/dL (ref 26–217)
IgG (Immunoglobin G), Serum: 2074 mg/dL — ABNORMAL HIGH (ref 700–1600)

## 2018-02-26 LAB — ENA+DNA/DS+SJORGEN'S
SSA (Ro) (ENA) Antibody, IgG: <0.2 AI (ref 0.0–0.9)
SSB (La) (ENA) Antibody, IgG: <0.2 AI (ref 0.0–0.9)

## 2018-02-26 LAB — ANA W/REFLEX: ANA: POSITIVE — AB

## 2018-02-26 LAB — VITAMIN B12: VITAMIN B 12: 587 pg/mL (ref 180–914)

## 2018-02-27 ENCOUNTER — Ambulatory Visit: Payer: Self-pay | Admitting: *Deleted

## 2018-02-27 LAB — TRYPTASE: Tryptase: 11.4 ug/L (ref 2.2–13.2)

## 2018-02-27 NOTE — Telephone Encounter (Signed)
Phone call from pt. with report of "dime- size, slightly raised area at base of left thumb."  Voiced concern the area of base of left thumb, could be the start of something, like the infection on the right hand.  Denied redness or tenderness of the area on left thumb.  Stated she can feel a difference in the mobility of the left thumb.  Reported having 2 spots that looked like hives, on left forearm yesterday.  Stated today, the areas are still discolored, but not red, or itchy, like they had been.  Reported she completed antibiotics yesterday, for cellulitis of right hand.  Reported there is still some swelling of the right hand, but the redness has improved.  Denied fever or chills.  Scheduled an appt. with PCP tomorrow, due to concern for possible early signs of infection.  Appt. Made for 10:00 AM 5/23 with Alysia Penna. Pt. Agrees with plan.    Reason for Disposition . Nursing judgment or information in reference  Answer Assessment - Initial Assessment Questions 1. REASON FOR CALL: "What is your main concern right now?"     Base of left thumb has dime size , slightly raised  area, non-tender; not red 2. ONSET: "When did the ___ start?"     This AM 3. SEVERITY: "How bad is the ___?"     mild 4. FEVER: "Do you have a fever?"     No fever/ chills  5. OTHER SYMPTOMS: "Do you have any other new symptoms?"    Hives on left arm yesterday 6. INTERVENTIONS AND RESPONSE: "What have you done so far to try to make this better? What medications have you used?"     No attempted treatment at present 7. PREGNANCY: "Is there any chance you are pregnant?"     n/a  Protocols used: NO GUIDELINE AVAILABLE-A-AH  Message from Leafy Ro sent at 02/27/2018 1:27 PM EDT   Summary: please advise   Pt saw cody matthew at [facility] on 5-16 for cellulitis on right hand and now pt is having ? swelling on left hand. Pt has a lot of questions. Pt would like to know if she can resume taking her allergy  injection. Pt had a minor case of hives yesterday she is using calamine lotion that stopped the itching. Pt does have an ointment the nche prescribed to her

## 2018-02-28 ENCOUNTER — Ambulatory Visit: Payer: Medicare Other | Admitting: Nurse Practitioner

## 2018-02-28 LAB — COMP PANEL: LEUKEMIA/LYMPHOMA

## 2018-02-28 LAB — BCR-ABL1, CML/ALL, PCR, QUANT

## 2018-03-01 ENCOUNTER — Other Ambulatory Visit: Payer: Self-pay | Admitting: Family Medicine

## 2018-03-01 DIAGNOSIS — R7989 Other specified abnormal findings of blood chemistry: Principal | ICD-10-CM

## 2018-03-01 DIAGNOSIS — R778 Other specified abnormalities of plasma proteins: Secondary | ICD-10-CM

## 2018-03-03 LAB — IGE: IgE (Immunoglobulin E), Serum: 1436 IU/mL — ABNORMAL HIGH (ref 6–495)

## 2018-03-06 ENCOUNTER — Telehealth: Payer: Self-pay | Admitting: *Deleted

## 2018-03-06 NOTE — Telephone Encounter (Addendum)
Patient informed that she is being referred to Rheumatology. Enrique Sack, please do the referral I told her to expect a call from [facility] with an appt

## 2018-03-06 NOTE — Telephone Encounter (Signed)
Called and asking for lab results  Next appt:  03/12/2018 at 10:30 AM in Cardiology ([facility] [place] [facility])   Ref Range & Units 9d ago  ds DNA Ab 0 - 9 IU/mL <1   Comment: (NOTE)                   Negative   <5                   Equivocal 5 - 9                   Positive   >9   Ribonucleic Protein 0.0 - 0.9 AI <0.2   ENA SM Ab Ser-aCnc 0.0 - 0.9 AI <0.2   SSA (Ro) (ENA) Antibody, IgG 0.0 - 0.9 AI <0.2   SSB (La) (ENA) Antibody, IgG 0.0 - 0.9 AI <0.2   See below:  Comment   Comment: (NOTE)  Autoantibody            Disease Association  ------------------------------------------------------------             Condition         Frequency  ---------------------  ------------------------  ---------  Antinuclear Antibody,  SLE, mixed connective  Direct (ANA-D)      tissue diseases  ---------------------  ------------------------  ---------  dsDNA          SLE            40 - 60%  ---------------------  ------------------------  ---------  Chromatin        Drug induced SLE        90%              SLE            48 - 97%  ---------------------  ------------------------  ---------  SSA (Ro)         SLE            25 - 35%              Sjogren's Syndrome     40 - 70%              Neonatal Lupus         100%  ---------------------  ------------------------  ---------  SSB (La)         SLE              10%              Sjogren's Syndrome       30%  ---------------------  -----------------------  ---------  Sm (anti-Smith)     SLE            15 - 30%  ---------------------  -----------------------  ---------  RNP           Mixed Connective Tissue              Disease              95%  (U1 nRNP,        SLE            30 - 50%  anti-ribonucleoprotein) Polymyositis and/or              Dermatomyositis         20%  ---------------------  ------------------------  ---------  Scl-70 (antiDNA     Scleroderma (diffuse)   20 - 35%  topoisomerase)      Crest  13%  ---------------------  ------------------------  ---------  Jo-1           Polymyositis and/or              Dermatomyositis      20 - 40%  ---------------------  ------------------------  ---------  Centromere B       Scleroderma -Crest              variant             80%  Performed At: [facility]  [place], [place] [id number]  Rush Farmer MD [id number]  Performed at [facility], [address] [place], [place]  [address]   Resulting Agency  [facility]      Specimen Collected: 02/25/18 10:34  Last Resulted: 02/26/18 13:39       Dx:  Eosinophilia   Ref Range & Units 9d ago  IgG (Immunoglobin G), Serum 700 - 1,600 mg/dL 2,074High    IgA 87 - 352 mg/dL 222   IgM (Immunoglobulin M), Srm 26 - 217 mg/dL 110   Comment: (NOTE)  Performed At: [facility]  [place], [place] [id number]  Rush Farmer MD [id number]  Performed at [facility], [address] [place], [place]  [address]   Resulting Agency  [facility]      Specimen Collected: 02/25/18 10:34 Last Resulted: 02/26/18 07:40       Dx:  Eosinophilia   Ref Range & Units 9d ago 13d ago  WBC 4.0 - 10.5 K/uL 25.5High   23.0 Repeated and verified X2.High Panic    RBC 3.80 - 5.20 MIL/uL 4.07  3.90 R  Hemoglobin 12.0 - 16.0 g/dL 11.7Low   11.1Low  R  HCT 35.0 - 47.0 % 35.3  33.4Low  R  MCV 80.0 - 100.0 fL 86.6  85.6 R  MCH 26.0 - 34.0 pg 28.9    MCHC 32.0 - 36.0 g/dL 33.3  33.4 R  RDW 11.5 - 14.5 % 15.4High   14.7 R  Platelets 150 -  440 K/uL 347  325.0 R  Neutrophils Relative % % 21  14.8Low  R  Lymphocytes Relative % 10  12.7 R  Monocytes Relative % 2  2.9Low  R  Eosinophils Relative % 66  69.2High  R, CM  Basophils Relative % 1  0.4 R  Neutro Abs 1.4 - 6.5 K/uL 5.4  3.4 R  Lymphs Abs 1.0 - 3.6 K/uL 2.6  2.9 R  Monocytes Absolute 0.2 - 0.9 K/uL 0.5  0.7 R  Eosinophils Absolute 0 - 0.7 K/uL 16.7High   15.9High  R  Basophils Absolute 0 - 0.1 K/uL 0.3High   0.1 R  WBC Morphology  SMUDGE CELLS    Smear Review  CONFIRMED BY MANUAL DIFF        Dx:  Eosinophilia   Ref Range & Units 9d ago  Anit Nuclear Antibody(ANA) Negative PositiveAbnormal         Dx:  Eosinophilia   Ref Range & Units 9d ago  Folate >5.9 ng/mL 20.7       Dx:  Eosinophilia   Ref Range & Units 9d ago  Tryptase 2.2 - 13.2 ug/L 11.4   Comment: (NOTE)       Dx:  Eosinophilia   Ref Range & Units 9d ago  IgE (Immunoglobulin E), Serum 6 - 495 IU/mL 1,436High         Dx:  Eosinophilia  Component  9d ago  PATH INTERP XXX-IMP Comment   Comment: Marked absolute eosinophilia, see comment.  ANNOTATION COMMENT IMP Comment VC  Comment: (NOTE)  Eosinophilia may be associated with drug reaction, parasitic  infection, and  neoplastic processes, among others. Additional cytogenetic and/or  molecular  testing for PDGFRA, PDGFRB, and FGFR1 abnormalities may be performed  on a  fresh peripheral blood specimen, if clinically indicated.   CLINICAL INFO Comment VC  Comment: (NOTE)  Accompanying CBC dated 02-25-18 shows:  WBC count 25.5, Eos% 66, Neu 5.4, Lym 2.6, Mon 0.5, Eos 16.7, Bas 0.3   Misc Source Comment   Comment: (NOTE)  Notably, the specimen was received in lithium heparin which is not a  recommended anticoagulant for flow cytometry testing. The recommended  anticoagulant for testing is sodium heparin. The method performance  specifications have not been established for this test in Lithium  Heparin.  The test result should be integrated  into the clinical context for  interpretation.   ASSESSMENT OF LEUKOCYTES Comment   Comment: (NOTE)  No monoclonal B cell population is detected. kappa:lambda ratio 1.8  There is no loss of, or aberrant expression of, the pan T cell  antigens to  suggest a neoplastic T cell process.  An increased CD4/T helper to CD8/T suppressor cell ratio is detected.  CD4:CD8 ratio 10.1  No circulating blasts are detected.  There is no immunophenotypic evidence of abnormal myeloid maturation.  Eosinophils are significantly increased and account for approximately  59%  of leukocytes. Eosinophilia can be associated with drug reaction,  parasitic  infection, and neoplastic processes, among others. Clinical  correlation is  necessary. Additional cytogenetic and/or molecular testing may be  indicated.  Analysis of the lymphocyte population shows: B cells 24%, T cells  70%, NK  cells 6%   % Viable Cells Comment VC  Comment: 83%  ANALYSIS AND GATING STRATEGY Comment   Comment: 8 color analysis with CD45/SSC  IMMUNOPHENOTYPING STUDY Comment   Comment: (NOTE)  CD2    Normal     CD3    Normal  CD4    Normal     CD5    Normal  CD7    Normal     CD8    Normal  CD10   Normal     CD11b   Normal  CD13   Normal     CD14   Normal  CD16   Normal     CD19   Normal  CD20   Normal     CD33   Normal  CD34   Normal     CD38   Normal  CD45   Normal     CD56   Normal  CD57   Normal     CD117   Normal  HLA-DR  Normal     KAPPA   Normal  LAMBDA  Normal     CD64   Normal   PATHOLOGIST NAME Comment   Comment: Henrietta Hoover, M.D.        Dx:  Eosinophilia   Ref Range & Units 9d ago  Vitamin B-12 180 - 914 pg/mL 587        Dx:  Eosinophilia   Ref Range & Units 9d ago  b2a2 transcript % Comment   Comment: (NOTE)       <0.0032 %  (sensitivity limit of assay)   b3a2 transcript % Comment    Comment: (NOTE)       <0.0032 %  (sensitivity limit of  assay)   E1A2 Transcript % Comment   Comment: (NOTE)       <0.0032 %  (sensitivity limit of assay)   Interpretation (BCRAL):  Comment   Comment: (NOTE)  NEGATIVE for the BCR-ABL1 e1a2 (p190), e13a2 (b2a2, p210) and e14a2  (b3a2, p210) fusion transcripts. These results do not rule out the  presence of rare BCR-ABL1 transcripts not detected by this assay.        JAK2 V617F Qual. with reflex to Exon 12  Order: [id number]  Status:  In process  Visible to patient:  No (Not Released)  Next appt:  03/12/2018 at 10:30 AM in Cardiology ([facility] [place] [facility])  Dx:  Eosinophilia  In Process    Specimen Collected: 02/25/18 10:14 Last Resulted: 02/25/18 10:18

## 2018-03-06 NOTE — Telephone Encounter (Signed)
Rheumatology referral for positive ANA, keep f/u as scheduled.

## 2018-03-07 ENCOUNTER — Other Ambulatory Visit: Payer: Self-pay | Admitting: *Deleted

## 2018-03-07 DIAGNOSIS — R768 Other specified abnormal immunological findings in serum: Secondary | ICD-10-CM

## 2018-03-07 NOTE — Progress Notes (Signed)
NA

## 2018-03-07 NOTE — Telephone Encounter (Signed)
Referral has been sent.

## 2018-03-11 LAB — JAK2  V617F QUAL. WITH REFLEX TO EXON 12: Reflex:: 15

## 2018-03-11 LAB — JAK2 EXONS 12-15

## 2018-03-12 ENCOUNTER — Ambulatory Visit (INDEPENDENT_AMBULATORY_CARE_PROVIDER_SITE_OTHER): Payer: Medicare Other

## 2018-03-12 ENCOUNTER — Other Ambulatory Visit: Payer: Self-pay

## 2018-03-12 DIAGNOSIS — R778 Other specified abnormalities of plasma proteins: Secondary | ICD-10-CM

## 2018-03-12 DIAGNOSIS — R748 Abnormal levels of other serum enzymes: Secondary | ICD-10-CM | POA: Diagnosis not present

## 2018-03-12 DIAGNOSIS — R7989 Other specified abnormal findings of blood chemistry: Principal | ICD-10-CM

## 2018-03-20 DIAGNOSIS — J8281 Chronic eosinophilic pneumonia: Secondary | ICD-10-CM | POA: Insufficient documentation

## 2018-03-20 DIAGNOSIS — I890 Lymphedema, not elsewhere classified: Secondary | ICD-10-CM | POA: Diagnosis not present

## 2018-03-20 DIAGNOSIS — R748 Abnormal levels of other serum enzymes: Secondary | ICD-10-CM

## 2018-03-20 DIAGNOSIS — R768 Other specified abnormal immunological findings in serum: Secondary | ICD-10-CM | POA: Diagnosis not present

## 2018-03-20 DIAGNOSIS — R7689 Other specified abnormal immunological findings in serum: Secondary | ICD-10-CM | POA: Insufficient documentation

## 2018-03-20 DIAGNOSIS — J82 Pulmonary eosinophilia, not elsewhere classified: Secondary | ICD-10-CM | POA: Diagnosis not present

## 2018-03-20 HISTORY — DX: Abnormal levels of other serum enzymes: R74.8

## 2018-03-25 ENCOUNTER — Encounter: Payer: Self-pay | Admitting: Internal Medicine

## 2018-03-25 ENCOUNTER — Encounter: Payer: Self-pay | Admitting: Family Medicine

## 2018-03-25 ENCOUNTER — Ambulatory Visit (INDEPENDENT_AMBULATORY_CARE_PROVIDER_SITE_OTHER): Payer: Medicare Other | Admitting: Family Medicine

## 2018-03-25 ENCOUNTER — Ambulatory Visit (INDEPENDENT_AMBULATORY_CARE_PROVIDER_SITE_OTHER): Payer: Medicare Other | Admitting: Internal Medicine

## 2018-03-25 VITALS — BP 152/90 | HR 111 | Ht 66.0 in | Wt 242.0 lb

## 2018-03-25 VITALS — BP 124/70 | HR 101 | Temp 97.5°F | Ht 66.93 in | Wt 244.0 lb

## 2018-03-25 DIAGNOSIS — J4550 Severe persistent asthma, uncomplicated: Secondary | ICD-10-CM

## 2018-03-25 DIAGNOSIS — D721 Eosinophilia, unspecified: Secondary | ICD-10-CM

## 2018-03-25 DIAGNOSIS — M1990 Unspecified osteoarthritis, unspecified site: Secondary | ICD-10-CM | POA: Diagnosis not present

## 2018-03-25 DIAGNOSIS — L509 Urticaria, unspecified: Secondary | ICD-10-CM

## 2018-03-25 MED ORDER — TRIAMCINOLONE ACETONIDE 0.1 % EX OINT: 1.0000 "application " | TOPICAL_OINTMENT | Freq: Two times a day (BID) | CUTANEOUS | 0 refills | Status: DC

## 2018-03-25 MED ORDER — CETIRIZINE HCL 10 MG PO TABS: 10.0000 mg | ORAL_TABLET | Freq: Every day | ORAL | 6 refills | Status: DC

## 2018-03-25 MED ORDER — MELOXICAM 15 MG PO TABS: 15.0000 mg | ORAL_TABLET | Freq: Every day | ORAL | 0 refills | Status: DC

## 2018-03-25 MED ORDER — TIOTROPIUM BROMIDE MONOHYDRATE 1.25 MCG/ACT IN AERS: 2.0000 | INHALATION_SPRAY | Freq: Two times a day (BID) | RESPIRATORY_TRACT | 5 refills | Status: DC

## 2018-03-25 MED ORDER — PREDNISONE 5 MG PO TABS: 5.0000 mg | ORAL_TABLET | Freq: Every day | ORAL | 2 refills | Status: DC

## 2018-03-25 NOTE — Progress Notes (Signed)
[place]  [phone] [phone]) [phone]  ID: [name] OB: [birth year]  MR#: [id number]  HKV#:[id number]  Patient Care Team: Flossie Buffy, NP as PCP - General (Internal Medicine) Marlowe Sax, MD as Referring Physician (Internal Medicine)  CHIEF COMPLAINT: Eosinophilia  INTERVAL HISTORY: Patient returns to clinic today for repeat laboratory work and further evaluation.  She currently feels well and is at her baseline. She has no neurologic complaints.  She denies any recent fevers or illnesses.  She has a good appetite and denies weight loss.  She denies any night sweats.  She has no chest pain or shortness of breath.  She denies any nausea, vomiting, constipation, or diarrhea.  She has no urinary complaints.  Patient offers no specific complaints today.  REVIEW OF SYSTEMS:   Review of Systems  Constitutional: Negative.  Negative for fever, malaise/fatigue and weight loss.  Respiratory: Negative.  Negative for cough and shortness of breath.   Cardiovascular: Negative.  Negative for chest pain and leg swelling.  Gastrointestinal: Negative.  Negative for abdominal pain.  Genitourinary: Negative.  Negative for dysuria.  Musculoskeletal: Negative.  Negative for myalgias.  Skin: Negative.  Negative for rash.  Neurological: Negative.  Negative for sensory change, focal weakness and weakness.  Endo/Heme/Allergies: Negative for environmental allergies.  Psychiatric/Behavioral: The patient is nervous/anxious.     As per HPI. Otherwise, a complete review of systems is negative.  PAST MEDICAL HISTORY: Past Medical History:  Diagnosis Date  . Allergy    allergic rhinitis  . Angioedema 02/2006  . Arthritis    OA of knees  . Asthma   . Complication of anesthesia    apnea- sleep  . Frequent headaches   . Hiatal hernia   . Hypertension   . Hypothyroid   . Labile blood pressure   . Lower extremity edema   . Lung disease   .  Pneumonia   . Pneumonia, eosinophilic ([place])   . Shortness of breath dyspnea   . Sleep apnea   . Urinary incontinence   . Wheezing     PAST SURGICAL HISTORY: Past Surgical History:  Procedure Laterality Date  . BREAST BIOPSY Left 2013   NEG  . BREAST SURGERY  1966   breast biopsy  . CESAREAN SECTION     x2  . FLEXIBLE BRONCHOSCOPY N/A 09/16/2015   Procedure: FLEXIBLE BRONCHOSCOPY WITH FLURO ;  Surgeon: Vilinda Boehringer, MD;  Location: [facility] ORS;  Service: Pulmonary;  Laterality: N/A;    FAMILY HISTORY: Family History  Problem Relation Age of Onset  . Diabetes Mother        pre-diabetic  . Heart disease Mother        ? heart disease  . Emphysema Mother   . Thyroid disease Mother   . Diabetes Maternal Uncle   . Heart disease Father   . Breast cancer Neg Hx     ADVANCED DIRECTIVES (Y/N):  N  HEALTH MAINTENANCE: Social History   Tobacco Use  . Smoking status: Never Smoker  . Smokeless tobacco: Never Used  Substance Use Topics  . Alcohol use: No    Alcohol/week: 0.0 oz  . Drug use: No     Colonoscopy:  PAP:  Bone density:  Lipid panel:  Allergies  Allergen Reactions  . Atorvastatin     Muscle pain   . Erythromycin     REACTION: feels sick    Current Outpatient Medications  Medication Sig Dispense Refill  . albuterol (ACCUNEB) 0.63 MG/3ML nebulizer  solution Take 3 mLs (0.63 mg total) by nebulization every 6 (six) hours as needed for wheezing or shortness of breath. 75 mL 0  . albuterol (VENTOLIN HFA) 108 (90 Base) MCG/ACT inhaler TAKE 1 TO 2 PUFFS EVERY 6 HOURS AS NEEDED 18 g 11  . amoxicillin-clavulanate (AUGMENTIN) 875-125 MG tablet Take 1 tablet by mouth 2 (two) times daily.    . B Complex-C-Folic Acid (STRESS B COMPLEX PO) Take 1 tablet by mouth daily.    . Calcium Carbonate-Vitamin D (CALTRATE 600+D PO) Take 1 tablet by mouth 2 (two) times daily.    . cetirizine (ZYRTEC ALLERGY) 10 MG tablet Take 1 tablet (10 mg total) by mouth daily. 30 tablet 6  .  co-enzyme Q-10 30 MG capsule Take 200 mg by mouth daily.    Marland Kitchen docusate sodium (COLACE) 100 MG capsule Take 1 capsule (100 mg total) by mouth 2 (two) times daily. 10 capsule 0  . EPIPEN 2-PAK 0.3 MG/0.3ML SOAJ injection See admin instructions.  99  . fexofenadine (ALLEGRA ALLERGY) 60 MG tablet Take 60 mg by mouth daily.     . Fluticasone-Salmeterol (ADVAIR DISKUS) 250-50 MCG/DOSE AEPB Inhale 1 puff into the lungs 2 (two) times daily.     . furosemide (LASIX) 20 MG tablet Take 0.5 tablets (10 mg total) by mouth daily. 30 tablet 1  . GARLIC PO Take 1 capsule by mouth daily.    Marland Kitchen HYDROcodone-acetaminophen (NORCO/VICODIN) 5-325 MG tablet Take 1-2 tablets by mouth every 6 (six) hours as needed for moderate pain or severe pain. 30 tablet 0  . levothyroxine (SYNTHROID, LEVOTHROID) 50 MCG tablet TAKE 1 TABLET BY MOUTH EVERY DAY 30 tablet 2  . meloxicam (MOBIC) 15 MG tablet Take 1 tablet (15 mg total) by mouth daily. 30 tablet 0  . montelukast (SINGULAIR) 10 MG tablet TAKE 1 TABLET BY MOUTH EVERY DAY 90 tablet 1  . Multiple Vitamins-Minerals (CENTRUM ADULTS PO) Take 1 tablet by mouth daily.    Marland Kitchen omeprazole (PRILOSEC) 40 MG capsule Take 1 capsule (40 mg total) by mouth daily. (Patient taking differently: Take 20 mg by mouth daily. ) 30 capsule 5  . ONE TOUCH ULTRA TEST test strip USE TO TEST TWICE DAILY DX E09.9 100 each 0  . ONETOUCH DELICA LANCETS 92E MISC USE TO TEST SUGARS TWICE DAILY DX E09.9 100 each 0  . predniSONE (DELTASONE) 5 MG tablet Take 1 tablet (5 mg total) by mouth daily with breakfast. 30 tablet 2  . Tiotropium Bromide Monohydrate (SPIRIVA RESPIMAT) 1.25 MCG/ACT AERS Inhale 2 puffs into the lungs 2 (two) times daily. 1 Inhaler 5  . triamcinolone ointment (KENALOG) 0.1 % Apply 1 application topically 2 (two) times daily. 80 g 0  . diphenhydrAMINE (BENADRYL ALLERGY) 25 MG tablet Take 1 tablet (25 mg total) by mouth every 8 (eight) hours for 1 day, THEN 1 tablet (25 mg total) every 12 (twelve)  hours for 2 days, THEN 1 tablet (25 mg total) every 8 (eight) hours as needed for up to 2 days for itching. 30 tablet 0   Current Facility-Administered Medications  Medication Dose Route Frequency Provider Last Rate Last Dose  . omalizumab Arvid Right) injection 375 mg  375 mg Subcutaneous Q14 Days Flora Lipps, MD   375 mg at 08/16/17 1249  . omalizumab Arvid Right) injection 375 mg  375 mg Subcutaneous Q14 Days Flora Lipps, MD   375 mg at 09/25/17 1638  . omalizumab Arvid Right) injection 375 mg  375 mg Subcutaneous Q14 Days Kasa, Kurian,  MD   375 mg at 10/11/17 1625    OBJECTIVE: Vitals:   03/28/18 1114  BP: (!) 163/95  Pulse: (!) 109  Resp: 18  Temp: 97.8 F (36.6 C)     Body mass index is 39.3 kg/m.    ECOG FS:0 - Asymptomatic  General: Well-developed, well-nourished, no acute distress. Eyes: Pink conjunctiva, anicteric sclera. Lungs: Clear to auscultation bilaterally. Heart: Regular rate and rhythm. No rubs, murmurs, or gallops. Abdomen: Soft, nontender, nondistended. No organomegaly noted, normoactive bowel sounds. Musculoskeletal: No edema, cyanosis, or clubbing. Neuro: Alert, answering all questions appropriately. Cranial nerves grossly intact. Skin: No rashes or petechiae noted. Psych: Normal affect.   LAB RESULTS:  Lab Results  Component Value Date   NA 138 02/25/2018   K 3.5 02/25/2018   CL 106 02/25/2018   CO2 24 02/25/2018   GLUCOSE 102 (H) 02/25/2018   BUN 9 02/25/2018   CREATININE 0.80 02/25/2018   CALCIUM 9.3 02/25/2018   PROT 7.8 02/25/2018   ALBUMIN 3.1 (L) 02/25/2018   AST 60 (H) 02/25/2018   ALT 61 (H) 02/25/2018   ALKPHOS 277 (H) 02/25/2018   BILITOT 0.7 02/25/2018   GFRNONAA >60 02/25/2018   GFRAA >60 02/25/2018    Lab Results  Component Value Date   WBC 20.5 (H) 03/28/2018   NEUTROABS 3.9 03/28/2018   HGB 10.4 (L) 03/28/2018   HCT 31.7 (L) 03/28/2018   MCV 87.2 03/28/2018   PLT 221 03/28/2018     STUDIES: No results found.  ASSESSMENT:  Eosinophilia  PLAN:    1. Eosinophilia: Patient continues to have an elevated white blood cell count with eosinophilic predominance.  She was noted to have a positive ANA, but was evaluated by rheumatology and this was thought to be clinically insignificant. She has a mildly elevated IgG level, but IgA and IgM are within normal limits.  IgE levels remain significantly elevated, but this is chronic and unchanged.  Tryptase, flow cytometry, Jak 2 mutation, and BCR-ABL are all negative or within normal limits.  No intervention is needed at this time.  Can consider bone marrow biopsy in the future if necessary, but this is not needed at this point.  Return to clinic in 3 months with repeat laboratory work and further evaluation.    I spent a total of 20 minutes face-to-face with the patient of which greater than 50% of the visit was spent in counseling and coordination of care as summarized above.  Patient expressed understanding and was in agreement with this plan. She also understands that She can call clinic at any time with any questions, concerns, or complaints.   Cancer Staging No matching staging information was found for the patient.  Lloyd Huger, MD   04/01/2018 7:26 AM

## 2018-03-25 NOTE — Progress Notes (Signed)
Rachael Silva - 72 y.o. female MRN [id number]  Date of birth: Jun 20, 1946  Subjective Chief Complaint  Patient presents with  . Pain    hand and feet swelling and painful/going for a while/ibuprofen 800?---knee pain--naproxen help a little/refill for kenalog cream?--hives/echo result?    HPI Rachael Silva is a 72 y.o. female here today with complaint of pain and swelling in multiple joints including knees, feet and hands.  Has had elevations in eosinophils recently and evaluated by Hematology recently.  Found to have +ANA however reflex testing was negative.  She was referred to rheumatology as well, she states that she was retested there and found to be negative, initial test thought to be false positive.  She is unsure if other testing was done. She has not had any swelling like she had had previously associated with what appeared to be cellulitis.  She had been taking ibuprofen and aleve for her joint pain which was helpful.  She was prescribed meloxicam previously as well but never took this.  She does endorse some worsening of her pulmonary symptoms related to eosinophilic pneumonia and has an appt with her pulmonologist later this afternoon.  She is only using her prescribed inhalers on a prn basis.  She has not had any fevers, new rashes, muscle pain, shortness of breath, nausea, vomiting, diarrhea, chest pain, palpitations.   ROS:  ROS completed and negative except as noted per HPI.   Allergies  Allergen Reactions  . Atorvastatin     Muscle pain   . Erythromycin     REACTION: feels sick    Past Medical History:  Diagnosis Date  . Allergy    allergic rhinitis  . Angioedema 02/2006  . Arthritis    OA of knees  . Asthma   . Complication of anesthesia    apnea- sleep  . Frequent headaches   . Hiatal hernia   . Hypertension   . Hypothyroid   . Labile blood pressure   . Lower extremity edema   . Lung disease   . Pneumonia   . Pneumonia, eosinophilic ([facility])   . Shortness of  breath dyspnea   . Sleep apnea   . Urinary incontinence   . Wheezing     Past Surgical History:  Procedure Laterality Date  . BREAST BIOPSY Left 2013   NEG  . BREAST SURGERY  1966   breast biopsy  . CESAREAN SECTION     x2  . FLEXIBLE BRONCHOSCOPY N/A 09/16/2015   Procedure: FLEXIBLE BRONCHOSCOPY WITH FLURO ;  Surgeon: Stephanie Acre, MD;  Location: [facility] ORS;  Service: Pulmonary;  Laterality: N/A;    Social History   Socioeconomic History  . Marital status: Single    Spouse name: Not on file  . Number of children: 2  . Years of education: Not on file  . Highest education level: Not on file  Occupational History  . Occupation: retired    Associate Professor: RETIRED  Social Needs  . Financial resource strain: Not on file  . Food insecurity:    Worry: Not on file    Inability: Not on file  . Transportation needs:    Medical: Not on file    Non-medical: Not on file  Tobacco Use  . Smoking status: Never Smoker  . Smokeless tobacco: Never Used  Substance and Sexual Activity  . Alcohol use: No    Alcohol/week: 0.0 oz  . Drug use: No  . Sexual activity: Never  Lifestyle  . Physical activity:  Days per week: Not on file    Minutes per session: Not on file  . Stress: Not on file  Relationships  . Social connections:    Talks on phone: Not on file    Gets together: Not on file    Attends religious service: Not on file    Active member of club or organization: Not on file    Attends meetings of clubs or organizations: Not on file    Relationship status: Not on file  Other Topics Concern  . Not on file  Social History Narrative  . Not on file    Family History  Problem Relation Age of Onset  . Diabetes Mother        pre-diabetic  . Heart disease Mother        ? heart disease  . Emphysema Mother   . Thyroid disease Mother   . Diabetes Maternal Uncle   . Heart disease Father   . Breast cancer Neg Hx     Health Maintenance  Topic Date Due  . URINE MICROALBUMIN   11/22/2017  . FOOT EXAM  12/01/2017  . TETANUS/TDAP  10/04/2018 (Originally 10/09/2013)  . INFLUENZA VACCINE  05/09/2018  . OPHTHALMOLOGY EXAM  05/15/2018  . HEMOGLOBIN A1C  06/22/2018  . COLONOSCOPY  07/20/2018  . MAMMOGRAM  12/21/2018  . DEXA SCAN  Completed  . Hepatitis C Screening  Completed  . PNA vac Low Risk Adult  Completed    ----------------------------------------------------------------------------------------------------------------------------------------------------------------------------------------------------------------- Physical Exam BP 124/70   Pulse (!) 101   Temp (!) 97.5 F (36.4 C) (Oral)   Ht 5' 6.93" (1.7 m)   Wt 244 lb (110.7 kg)   SpO2 96%   BMI 38.30 kg/m   Physical Exam  Constitutional: She is oriented to person, place, and time. She appears well-nourished. No distress.  HENT:  Head: Normocephalic and atraumatic.  Mouth/Throat: Oropharynx is clear and moist.  Eyes: No scleral icterus.  Cardiovascular: Normal rate, regular rhythm and normal heart sounds.  Pulmonary/Chest: Effort normal and breath sounds normal.  Musculoskeletal:  Bilateral knees and ankles with mild swelling, no erythema or warmth.    Neurological: She is alert and oriented to person, place, and time.  Skin: No rash noted.  Psychiatric: She has a normal mood and affect. Her behavior is normal.    ------------------------------------------------------------------------------------------------------------------------------------------------------------------------------------------------------------------- Assessment and Plan  Eosinophilia Has f/u with hematology later this week ?If medication contributing vs uncontrolled eosinophilic pneumonia Previous elevation in cardiac enzymes, recent Echo reassuring.     Arthritis Given previous elevation in cardiac enzymes I think meloxicam would be a safer choice compared to ibuprofen or naproxen. New rx for meloxicam She will let  me know if symptoms are not controlled with this.

## 2018-03-25 NOTE — Assessment & Plan Note (Signed)
Given previous elevation in cardiac enzymes I think meloxicam would be a safer choice compared to ibuprofen or naproxen. New rx for meloxicam She will let me know if symptoms are not controlled with this.

## 2018-03-25 NOTE — Assessment & Plan Note (Signed)
Has f/u with hematology later this week ?If medication contributing vs uncontrolled eosinophilic pneumonia Previous elevation in cardiac enzymes, recent Echo reassuring.

## 2018-03-25 NOTE — Assessment & Plan Note (Signed)
>>  ASSESSMENT AND PLAN FOR ARTHRITIS WRITTEN ON 03/25/2018 10:43 AM BY MATTHEWS, CODY, DO  Given previous elevation in cardiac enzymes I think meloxicam would be a safer choice compared to ibuprofen or naproxen. New rx for meloxicam She will let me know if symptoms are not controlled with this.

## 2018-03-25 NOTE — Patient Instructions (Addendum)
Recommend moving out of current living conditions Recommend starting SPiriva and Zyrtec Plan for Fasanra shots Prednisone 5 mg daily

## 2018-03-25 NOTE — Patient Instructions (Signed)
Try meloxicam in place of ibuprofen and aleve.

## 2018-03-25 NOTE — Progress Notes (Signed)
MRN# [id number] Rachael Silva 1946/07/20   CC: Chief Complaint  Patient presents with  . Follow-up   synopsis: 72 year old female past medical history of asthma. Being followed by pulmonary for chronic eosinophilic pneumonia, status post bronchoscopy in December 2016 with negative cultures for AFB and fungus. Transbronchial biopsy-positive for eosinophilic infiltrate. Treated with steroids unable to wean since  October 2015.   CC follow up ASTHMA HPI Patient presents today for follow-up visit of her ABPA, asthma, chronic prednisone use secondary to lung disease. She stopped prednisone 10/05/17 She had Xolair shot on Jan 3rd and developed nodular red rash 3 days later She was supposed to be set up for Fas[facility]ra but [facility]  She has cough and wheezing and chest congestion and did NOT receive her second dose of Xolair in January  Since her last visit she's not had any further exacerbations of eosinophilic pneumonia or ABPA.  No acute signs of infection at this time No wheezing no shortness of breath No dyspnea on exertion Her asthma seems to be NOT controlled after stopping Xolair and prednisone +exposure to cats/carpet-she moved to daughters house and noticed a difference in her symptoms  She is constantly being exposed to allergens and cats at home  after further discussion, patient is constantly exposed to cat dander and thick carpet I have advised that she is always being exposed to triggers.  She was supposed to start Spiriva but did NOT due to fear of side affects  Review of Systems: Gen:  Denies  fever, sweats, chills HEENT: Denies blurred vision, double vision, ear pain, eye pain, hearing loss, nose bleeds, sore throat Cvc:  No dizziness, chest pain or heaviness Resp:  +wheezes +shortness of breath +cough Gi: Denies swallowing difficulty, stomach pain, nausea or vomiting, diarrhea, constipation, bowel incontinence Gu:  Denies bladder incontinence,  burning urine Ext:   No Joint pain, stiffness or swelling Skin: No skin rash, easy bruising or bleeding or hives Endoc:  No polyuria, polydipsia , polyphagia or weight change Other:  All other systems negative  Allergies:  Atorvastatin and Erythromycin  Physical Examination:  VS: Ht 5\' 6"  (1.676 m)   Wt 242 lb (109.8 kg)   BMI 39.06 kg/m   BP (!) 152/90 (BP Location: Left Arm, Cuff Size: Normal)   Pulse (!) 111   Ht 5\' 6"  (1.676 m)   Wt 242 lb (109.8 kg)   SpO2 99%   BMI 39.06 kg/m   General Appearance: No distress  HEENT: PERRLA, no ptosis, no other lesions noticed. No bruits Pulmonary:normal breath sounds., diaphragmatic excursion normal.No wheezing, No rales   Cardiovascular:  Normal S1,S2.  No m/r/g.     Abdomen:Exam: Benign, Soft, non-tender, No masses  Skin:   warm, no rashes, no ecchymosis  Extremities: normal, no cyanosis, clubbing, warm with normal capillary refill.     LABS Results for Rachael Silva, Rachael Silva (MRN [id number]) as of 08/01/2016 11:16  Ref. Range 07/02/2014 13:00 08/05/2015 15:12 03/30/2016 15:11  IgE (Immunoglobulin E), Serum Latest Ref Range: 0 - 100 IU/mL 2,401 (H) 1,253 (H) 1,180 (H)   Results for Rachael Silva, Rachael Silva (MRN [id number]) as of 08/01/2016 11:16  Ref. Range 08/05/2015 15:12 09/16/2015 12:46 03/30/2016 15:11 05/03/2016 08:42 07/26/2016 15:31  Eosinophil Latest Units: % 10 11 5  5.4 (H) 3        Assessment and Plan:   72 year old female with chronic cough, recurrent URIs, being treated as ABPA/chronic eosinophilic pneumonia, now off of  Xolair therapy due to rash and  allergic reaction which was being suppressed by chronic prednisone therapy which the patient wants to restart now at 5 mg daily. My goal was to take away prednisone but patient wants prednisone therapy it. I have advised the major risk factors of prednisone therapy again and also have strongly suggested to move away from her current living conditions   We will need to assess for Fesenra  again  Asthma, chronic/chronic eosinophilic pneumonia -Patient has severe persistent  chronic asthma which seemed to be well-controlled with Xolair and prednisone but after further discussion, patient can NOT continue with Xolair due to reaction  Plan for prednisone 5 mg daily and assess for Fasanra   Plan -STOP  Xolair , reassess with Miles Costain -resume prednisone -Avoid any allergens-she is constantly around cats and dogs along with carpet I have advised patient that she needs to make choices with her environmental exposures-remove cats and remove carpets It seems that when she cleans her moms house that has heavy carpet, she has reactive airways disease from all dust Continue Advair daily, will ADD Spiriva Continue allergy regiment and allergy shot Avoid sick contacts. Z pak ordered    ABPA (allergic bronchopulmonary aspergillosis) Patient educated on side effects of steroids (weight gain, wereretention, increased appetite, muscle weakness, bone degradation, dysfunctional calcium levels, elevated blood glucose levels, anxiety/agitation at night, decreased immune). -will not restart prednisone at this time  Patient educated on the current diagnosis, she understands that this is not an active infection, it is most likely a hyperreactive reaction to antigen exposure.  She does not need/require antifungal treatment at this time. This is her initial treatment course, if she has refractory or recurrent symptoms will consider adding antifungals at that time.  Bronchoscopy 09/2015: All cultures including fungal, bacterial, AFB a currently negative to date. Transbronchial biopsy showed eosinophilic infiltration consistent with eosinophilic pneumonia in 2016 Bronhoscopy with BAL 03/2016: negative cultures.    OSA on CPAP Cont with CPAP nightly CPAP 11cm H20 Compliance report shows 77% at 30 days as well as 67% for more than 4 hours a day Her AHI is 0.3she is on auto CPAP pressure 11-15 7 m  of water pressure    Patient satisfied with Plan of action and management. All questions answered Follow-up in 3 months for assessment resp status  Cynithia Hakimi [name], M.D.  [facility]  Medical Director [facility] Medical Director [facility] Cardio-Pulmonary Department

## 2018-03-26 ENCOUNTER — Ambulatory Visit: Payer: Medicare Other | Admitting: Internal Medicine

## 2018-03-27 ENCOUNTER — Other Ambulatory Visit: Payer: Self-pay | Admitting: *Deleted

## 2018-03-27 DIAGNOSIS — D721 Eosinophilia, unspecified: Secondary | ICD-10-CM

## 2018-03-27 NOTE — Progress Notes (Signed)
cbc

## 2018-03-28 ENCOUNTER — Inpatient Hospital Stay (HOSPITAL_BASED_OUTPATIENT_CLINIC_OR_DEPARTMENT_OTHER): Payer: Medicare Other | Admitting: Oncology

## 2018-03-28 ENCOUNTER — Encounter: Payer: Self-pay | Admitting: Oncology

## 2018-03-28 ENCOUNTER — Inpatient Hospital Stay: Payer: Medicare Other | Attending: Oncology

## 2018-03-28 VITALS — BP 163/95 | HR 109 | Temp 97.8°F | Resp 18 | Wt 243.5 lb

## 2018-03-28 DIAGNOSIS — D721 Eosinophilia, unspecified: Secondary | ICD-10-CM

## 2018-03-28 DIAGNOSIS — F419 Anxiety disorder, unspecified: Secondary | ICD-10-CM | POA: Diagnosis not present

## 2018-03-28 DIAGNOSIS — Z79899 Other long term (current) drug therapy: Secondary | ICD-10-CM | POA: Insufficient documentation

## 2018-03-28 DIAGNOSIS — E039 Hypothyroidism, unspecified: Secondary | ICD-10-CM | POA: Insufficient documentation

## 2018-03-28 DIAGNOSIS — M1990 Unspecified osteoarthritis, unspecified site: Secondary | ICD-10-CM | POA: Insufficient documentation

## 2018-03-28 DIAGNOSIS — Z7952 Long term (current) use of systemic steroids: Secondary | ICD-10-CM | POA: Insufficient documentation

## 2018-03-28 DIAGNOSIS — I10 Essential (primary) hypertension: Secondary | ICD-10-CM | POA: Diagnosis not present

## 2018-03-28 LAB — CBC WITH DIFFERENTIAL/PLATELET
BASOS PCT: 0 %
Basophils Absolute: 0 10*3/uL (ref 0–0.1)
EOS PCT: 64 %
Eosinophils Absolute: 13.1 10*3/uL — ABNORMAL HIGH (ref 0–0.7)
HCT: 31.7 % — ABNORMAL LOW (ref 35.0–47.0)
Hemoglobin: 10.4 g/dL — ABNORMAL LOW (ref 12.0–16.0)
LYMPHS ABS: 2.7 10*3/uL (ref 1.0–3.6)
Lymphocytes Relative: 13 %
MCH: 28.7 pg (ref 26.0–34.0)
MCHC: 32.9 g/dL (ref 32.0–36.0)
MCV: 87.2 fL (ref 80.0–100.0)
MONO ABS: 0.8 10*3/uL (ref 0.2–0.9)
Monocytes Relative: 4 %
NEUTROS ABS: 3.9 10*3/uL (ref 1.4–6.5)
Neutrophils Relative %: 19 %
PLATELETS: 221 10*3/uL (ref 150–440)
RBC: 3.63 MIL/uL — ABNORMAL LOW (ref 3.80–5.20)
RDW: 16.6 % — AB (ref 11.5–14.5)
WBC: 20.5 10*3/uL — ABNORMAL HIGH (ref 3.6–11.0)

## 2018-03-28 NOTE — Progress Notes (Signed)
Patient denies any concerns today.  

## 2018-03-29 DIAGNOSIS — R945 Abnormal results of liver function studies: Secondary | ICD-10-CM | POA: Diagnosis not present

## 2018-04-04 ENCOUNTER — Telehealth: Payer: Self-pay | Admitting: Nurse Practitioner

## 2018-04-04 ENCOUNTER — Telehealth: Payer: Self-pay | Admitting: Internal Medicine

## 2018-04-04 MED ORDER — IBUPROFEN 800 MG PO TABS: 800.0000 mg | ORAL_TABLET | Freq: Three times a day (TID) | ORAL | 0 refills | Status: DC | PRN

## 2018-04-04 NOTE — Telephone Encounter (Signed)
Okay.   DC meloxicam  Ibu 800mg  1 po q 8 with food prn #30 NR Follow up with Dr. Jerolyn CenterMathews.

## 2018-04-04 NOTE — Telephone Encounter (Signed)
Has she tried taking it at night ?

## 2018-04-04 NOTE — Telephone Encounter (Signed)
Copied from CRM [id number]. Topic: Quick Communication - Rx Refill/Question >> Apr 04, 2018 12:04 PM Crist InfanteHarrald, Kathy J wrote: Medication: meloxicam (MOBIC) 15 MG tablet  Pt states this med is too strong for her, makes her drowsy.  Pt states Dr Ashley RoyaltyMatthews advised she could get something else if this did not work. Pt would like Rx ibuprofen 800 mg sent or whatever the dr thinks.  [facility] [id number]- [place] - [address] [phone] (Phone) [phone] (Fax)

## 2018-04-04 NOTE — Telephone Encounter (Signed)
Arrival Date:04/04/18 Lot #: [id number] Exp date: 06/2019

## 2018-04-04 NOTE — Telephone Encounter (Signed)
Advise pt try to take med at night but pt report meloxicam doent seem to help at all because she still has pain and it also make her drowsy in the evening. She felt like ibuprofen works better than meloxicam in the past. Please advise, she is not sure what else to do.

## 2018-04-04 NOTE — Telephone Encounter (Signed)
Left vm for the pt to call back.  

## 2018-04-04 NOTE — Telephone Encounter (Signed)
Left vm for the pt to call back, need to inform message below.  

## 2018-04-04 NOTE — Telephone Encounter (Signed)
Dr. Doreene BurkeKremer please advise in absence of Dr. Ashley RoyaltyMatthews.   Dr. Ashley RoyaltyMatthews note form last ov 03/25/18  "Arthritis---Given previous elevation in cardiac enzymes I think meloxicam would be a safer choice compared to ibuprofen or naproxen. New rx for meloxicam She will let me know if symptoms are not controlled with this. "  Please advise, pt requesting something else because Meloxicam 15 mg once daily is making her drowsy.

## 2018-04-04 NOTE — Telephone Encounter (Signed)
Pt is aware. New rx sent and d/c meloxicam.

## 2018-04-17 ENCOUNTER — Other Ambulatory Visit: Payer: Self-pay | Admitting: Nurse Practitioner

## 2018-04-18 ENCOUNTER — Ambulatory Visit (INDEPENDENT_AMBULATORY_CARE_PROVIDER_SITE_OTHER): Payer: Medicare Other

## 2018-04-18 DIAGNOSIS — J4550 Severe persistent asthma, uncomplicated: Secondary | ICD-10-CM

## 2018-04-19 MED ORDER — BENRALIZUMAB 30 MG/ML ~~LOC~~ SOSY
30.0000 mg | PREFILLED_SYRINGE | Freq: Once | SUBCUTANEOUS | Status: AC
  Administered 2018-04-18: 30 mg via SUBCUTANEOUS

## 2018-04-27 ENCOUNTER — Other Ambulatory Visit: Payer: Self-pay | Admitting: Nurse Practitioner

## 2018-04-29 NOTE — Telephone Encounter (Signed)
Per Nche, pt transfer to Dr. Ashley RoyaltyMatthews. Please help.

## 2018-04-29 NOTE — Telephone Encounter (Signed)
Was not aware that she had switched PCP, no note in Epic and we have not discussed at previous visits.  I have only seen her for acute visits.  Has appt with Claris Gowerharlotte next week.

## 2018-04-29 NOTE — Telephone Encounter (Signed)
pls advise

## 2018-05-01 DIAGNOSIS — R945 Abnormal results of liver function studies: Secondary | ICD-10-CM | POA: Diagnosis not present

## 2018-05-07 ENCOUNTER — Encounter: Payer: Self-pay | Admitting: Nurse Practitioner

## 2018-05-07 ENCOUNTER — Ambulatory Visit (INDEPENDENT_AMBULATORY_CARE_PROVIDER_SITE_OTHER): Payer: Medicare Other | Admitting: Nurse Practitioner

## 2018-05-07 VITALS — BP 148/80 | HR 65 | Temp 98.0°F | Ht 66.0 in | Wt 228.4 lb

## 2018-05-07 DIAGNOSIS — E099 Drug or chemical induced diabetes mellitus without complications: Secondary | ICD-10-CM

## 2018-05-07 DIAGNOSIS — E559 Vitamin D deficiency, unspecified: Secondary | ICD-10-CM | POA: Diagnosis not present

## 2018-05-07 DIAGNOSIS — E039 Hypothyroidism, unspecified: Secondary | ICD-10-CM | POA: Diagnosis not present

## 2018-05-07 DIAGNOSIS — T380X5A Adverse effect of glucocorticoids and synthetic analogues, initial encounter: Secondary | ICD-10-CM | POA: Diagnosis not present

## 2018-05-07 DIAGNOSIS — G629 Polyneuropathy, unspecified: Secondary | ICD-10-CM

## 2018-05-07 DIAGNOSIS — E785 Hyperlipidemia, unspecified: Secondary | ICD-10-CM | POA: Diagnosis not present

## 2018-05-07 DIAGNOSIS — I10 Essential (primary) hypertension: Secondary | ICD-10-CM | POA: Diagnosis not present

## 2018-05-07 MED ORDER — LISINOPRIL 5 MG PO TABS: 5.0000 mg | ORAL_TABLET | Freq: Every day | ORAL | 2 refills | Status: DC

## 2018-05-07 MED ORDER — GABAPENTIN 100 MG PO CAPS: 100.0000 mg | ORAL_CAPSULE | Freq: Two times a day (BID) | ORAL | 2 refills | Status: DC

## 2018-05-07 NOTE — Progress Notes (Signed)
Subjective:  Patient ID: Rachael Silva, female    DOB: [birth year]  Age: 72 y.o. MRN: [id number]  CC: Follow-up (6 mo f.u. Pt is not fasting, pt sts she feels good and her blood sugars have been good. Pt sts last foot exam was in 10/2017 with Jonahtan Manseau. )  HPI DM due to chronic prednisone use: Last HgbA1c of 6.0  Home glucose: 96-150. Was unable to tolerate metformin, caused hypoglycemia with glucose in 80s. Complains of persistent numbness in fingers and feet since 01/2018. Constant, no home remedy used. Report difficulty with ambulation due to pain in feet. Denies any muscle weakness  HTN: Waxing and waning, no medication at this time. Current use of furosemide for chronic LE edema. CPAP use?? BP Readings from Last 3 Encounters:  05/07/18 (!) 148/80  03/28/18 (!) 163/95  03/25/18 (!) 152/90   LE edema: Improved significantly with use of furosemide and compression stocking.  Weight loss: Noted she lost 50Lbs in last 6months. She states it was partially intentionally: maintain DASH diet and portion control. exercise 2x/week. Also thinks it was also due to recent illness and hospitalization for 3days due to cellulitis.  Reviewed past Medical, Social and Family history today.  Outpatient Medications Prior to Visit  Medication Sig Dispense Refill  . albuterol (ACCUNEB) 0.63 MG/3ML nebulizer solution Take 3 mLs (0.63 mg total) by nebulization every 6 (six) hours as needed for wheezing or shortness of breath. 75 mL 0  . albuterol (VENTOLIN HFA) 108 (90 Base) MCG/ACT inhaler TAKE 1 TO 2 PUFFS EVERY 6 HOURS AS NEEDED 18 g 11  . B Complex-C-Folic Acid (STRESS B COMPLEX PO) Take 1 tablet by mouth daily.    . Calcium Carbonate-Vitamin D (CALTRATE 600+D PO) Take 1 tablet by mouth 2 (two) times daily.    . cetirizine (ZYRTEC ALLERGY) 10 MG tablet Take 1 tablet (10 mg total) by mouth daily. 30 tablet 6  . co-enzyme Q-10 30 MG capsule Take 200 mg by mouth daily.    Marland Kitchen. docusate sodium (COLACE) 100  MG capsule Take 1 capsule (100 mg total) by mouth 2 (two) times daily. 10 capsule 0  . EPIPEN 2-PAK 0.3 MG/0.3ML SOAJ injection See admin instructions.  99  . fexofenadine (ALLEGRA ALLERGY) 60 MG tablet Take 60 mg by mouth daily.     . Fluticasone-Salmeterol (ADVAIR DISKUS) 250-50 MCG/DOSE AEPB Inhale 1 puff into the lungs 2 (two) times daily.     . furosemide (LASIX) 20 MG tablet Take 0.5 tablets (10 mg total) by mouth daily. Need office visit for additional refills 45 tablet 2  . GARLIC PO Take 1 capsule by mouth daily.    Marland Kitchen. HYDROcodone-acetaminophen (NORCO/VICODIN) 5-325 MG tablet Take 1-2 tablets by mouth every 6 (six) hours as needed for moderate pain or severe pain. 30 tablet 0  . ibuprofen (ADVIL,MOTRIN) 800 MG tablet Take 1 tablet (800 mg total) by mouth every 8 (eight) hours as needed. 30 tablet 0  . levothyroxine (SYNTHROID, LEVOTHROID) 50 MCG tablet TAKE 1 TABLET BY MOUTH EVERY DAY 30 tablet 2  . montelukast (SINGULAIR) 10 MG tablet TAKE 1 TABLET BY MOUTH EVERY DAY 90 tablet 1  . Multiple Vitamins-Minerals (CENTRUM ADULTS PO) Take 1 tablet by mouth daily.    Marland Kitchen. omeprazole (PRILOSEC) 40 MG capsule Take 1 capsule (40 mg total) by mouth daily. (Patient taking differently: Take 20 mg by mouth daily. ) 30 capsule 5  . ONE TOUCH ULTRA TEST test strip USE TO TEST TWICE DAILY DX E09.9  100 each 0  . ONETOUCH DELICA LANCETS 33G MISC USE TO TEST SUGARS TWICE DAILY DX E09.9 100 each 0  . predniSONE (DELTASONE) 5 MG tablet Take 1 tablet (5 mg total) by mouth daily with breakfast. 30 tablet 2  . Tiotropium Bromide Monohydrate (SPIRIVA RESPIMAT) 1.25 MCG/ACT AERS Inhale 2 puffs into the lungs 2 (two) times daily. 1 Inhaler 5  . triamcinolone ointment (KENALOG) 0.1 % Apply 1 application topically 2 (two) times daily. 80 g 0  . amoxicillin-clavulanate (AUGMENTIN) 875-125 MG tablet Take 1 tablet by mouth 2 (two) times daily.    . diphenhydrAMINE (BENADRYL ALLERGY) 25 MG tablet Take 1 tablet (25 mg total)  by mouth every 8 (eight) hours for 1 day, THEN 1 tablet (25 mg total) every 12 (twelve) hours for 2 days, THEN 1 tablet (25 mg total) every 8 (eight) hours as needed for up to 2 days for itching. 30 tablet 0   Facility-Administered Medications Prior to Visit  Medication Dose Route Frequency Provider Last Rate Last Dose  . omalizumab Geoffry Paradise) injection 375 mg  375 mg Subcutaneous Q14 Days Erin Fulling, MD   375 mg at 08/16/17 1249  . omalizumab Geoffry Paradise) injection 375 mg  375 mg Subcutaneous Q14 Days Erin Fulling, MD   375 mg at 09/25/17 1638  . omalizumab Geoffry Paradise) injection 375 mg  375 mg Subcutaneous Q14 Days Erin Fulling, MD   375 mg at 10/11/17 1625    ROS See HPI  Objective:  BP (!) 148/80   Pulse 65   Temp 98 F (36.7 C) (Oral)   Ht 5\' 6"  (1.676 m)   Wt 228 lb 6.4 oz (103.6 kg)   SpO2 95%   BMI 36.86 kg/m   BP Readings from Last 3 Encounters:  05/07/18 (!) 148/80  03/28/18 (!) 163/95  03/25/18 (!) 152/90    Wt Readings from Last 3 Encounters:  05/07/18 228 lb 6.4 oz (103.6 kg)  03/28/18 243 lb 8 oz (110.5 kg)  03/25/18 242 lb (109.8 kg)    Physical Exam  Constitutional: She is oriented to person, place, and time. No distress.  Neck: Normal range of motion. Neck supple. No thyromegaly present.  Cardiovascular: Normal rate, regular rhythm, normal heart sounds and intact distal pulses.  Pulmonary/Chest: Effort normal and breath sounds normal. She has no wheezes. She has no rales.  Abdominal: Soft. Bowel sounds are normal. There is no tenderness.  Musculoskeletal: She exhibits no edema, tenderness or deformity.  Lymphadenopathy:    She has no cervical adenopathy.  Neurological: She is alert and oriented to person, place, and time.  Skin: Skin is warm and dry. No erythema.  Normal diabetic foot exam  Psychiatric: She has a normal mood and affect. Her behavior is normal.  Vitals reviewed.   Lab Results  Component Value Date   WBC 20.5 (H) 03/28/2018   HGB 10.4 (L)  03/28/2018   HCT 31.7 (L) 03/28/2018   PLT 221 03/28/2018   GLUCOSE 102 (H) 02/25/2018   CHOL 193 12/20/2017   TRIG 52 12/20/2017   HDL 72 12/20/2017   LDLDIRECT 138.9 04/07/2011   LDLCALC 111 (H) 12/20/2017   ALT 61 (H) 02/25/2018   AST 60 (H) 02/25/2018   NA 138 02/25/2018   K 3.5 02/25/2018   CL 106 02/25/2018   CREATININE 0.80 02/25/2018   BUN 9 02/25/2018   CO2 24 02/25/2018   TSH 3.171 12/20/2017   HGBA1C 6.0 (H) 12/20/2017   MICROALBUR <0.7 11/22/2016   Assessment &  Plan:   Jersi was seen today for follow-up.  Diagnoses and all orders for this visit:  Steroid-induced diabetes mellitus (HCC) -     Hemoglobin A1c; Future -     Microalbumin / creatinine urine ratio; Future -     Basic metabolic panel; Future  Hypothyroidism, unspecified type -     TSH; Future -     T4, free; Future  Peripheral polyneuropathy -     gabapentin (NEURONTIN) 100 MG capsule; Take 1 capsule (100 mg total) by mouth 2 (two) times daily. -     Ambulatory referral to Neurology  Essential hypertension -     Cancel: Comprehensive metabolic panel; Future -     lisinopril (PRINIVIL,ZESTRIL) 5 MG tablet; Take 1 tablet (5 mg total) by mouth daily. -     Basic metabolic panel; Future  Hyperlipidemia, unspecified hyperlipidemia type -     Cancel: Comprehensive metabolic panel; Future -     Lipid panel; Future  Vitamin D insufficiency -     Vitamin D 1,25 dihydroxy; Future   I have discontinued Lealer Mau's amoxicillin-clavulanate. I am also having her start on gabapentin and lisinopril. Additionally, I am having her maintain her Fluticasone-Salmeterol, fexofenadine, Multiple Vitamins-Minerals (CENTRUM ADULTS PO), Calcium Carbonate-Vitamin D (CALTRATE 600+D PO), co-enzyme Q-10, B Complex-C-Folic Acid (STRESS B COMPLEX PO), GARLIC PO, omeprazole, EPIPEN 2-PAK, ONETOUCH DELICA LANCETS 33G, ONE TOUCH ULTRA TEST, levothyroxine, diphenhydrAMINE, albuterol, albuterol, HYDROcodone-acetaminophen,  docusate sodium, triamcinolone ointment, Tiotropium Bromide Monohydrate, cetirizine, predniSONE, ibuprofen, montelukast, and furosemide. We will continue to administer omalizumab, omalizumab, and omalizumab.  Meds ordered this encounter  Medications  . gabapentin (NEURONTIN) 100 MG capsule    Sig: Take 1 capsule (100 mg total) by mouth 2 (two) times daily.    Dispense:  60 capsule    Refill:  2    Order Specific Question:   Supervising Provider    Answer:   Dianne Dun [[id number]]  . lisinopril (PRINIVIL,ZESTRIL) 5 MG tablet    Sig: Take 1 tablet (5 mg total) by mouth daily.    Dispense:  30 tablet    Refill:  2    Order Specific Question:   Supervising Provider    Answer:   Dianne Dun [[id number]]    Follow-up: Return in about 3 months (around 08/07/2018) for DM and HTN, hyperlipidemia, weight loss, neuropathy ( slot only).  Alysia Penna, NP

## 2018-05-07 NOTE — Patient Instructions (Addendum)
Start gabapentin 1cap at bedtime x3days, then increase to 1cap every 12hrs. You will be contacted to schedule appt with neurology.  Start lisinopril 5mg  once a day.  Continue glucose check 1-2times a day Start BP check 2-3times a week.  Bring BP and glucose reading to next office visit.  Return to lab tomorrow for blood draw and urine collection. You need to be fasting at least 6-8hrs prior to blood draw.  Neuropathic Pain Neuropathic pain is pain caused by damage to the nerves that are responsible for certain sensations in your body (sensory nerves). The pain can be caused by damage to:  The sensory nerves that send signals to your spinal cord and brain (peripheral nervous system).  The sensory nerves in your brain or spinal cord (central nervous system).  Neuropathic pain can make you more sensitive to pain. What would be a minor sensation for most people may feel very painful if you have neuropathic pain. This is usually a long-term condition that can be difficult to treat. The type of pain can differ from person to person. It may start suddenly (acute), or it may develop slowly and last for a long time (chronic). Neuropathic pain may come and go as damaged nerves heal or may stay at the same level for years. It often causes emotional distress, loss of sleep, and a lower quality of life. What are the causes? The most common cause of damage to a sensory nerve is diabetes. Many other diseases and conditions can also cause neuropathic pain. Causes of neuropathic pain can be classified as:  Toxic. Many drugs and chemicals can cause toxic damage. The most common cause of toxic neuropathic pain is damage from drug treatment for cancer (chemotherapy).  Metabolic. This type of pain can happen when a disease causes imbalances that damage nerves. Diabetes is the most common of these diseases. Vitamin B deficiency caused by long-term alcohol abuse is another common cause.  Traumatic. Any injury  that cuts, crushes, or stretches a nerve can cause damage and pain. A common example is feeling pain after losing an arm or leg (phantom limb pain).  Compression-related. If a sensory nerve gets trapped or compressed for a long period of time, the blood supply to the nerve can be cut off.  Vascular. Many blood vessel diseases can cause neuropathic pain by decreasing blood supply and oxygen to nerves.  Autoimmune. This type of pain results from diseases in which the body's defense system mistakenly attacks sensory nerves. Examples of autoimmune diseases that can cause neuropathic pain include lupus and multiple sclerosis.  Infectious. Many types of viral infections can damage sensory nerves and cause pain. Shingles infection is a common cause of this type of pain.  Inherited. Neuropathic pain can be a symptom of many diseases that are passed down through families (genetic).  What are the signs or symptoms? The main symptom is pain. Neuropathic pain is often described as:  Burning.  Shock-like.  Stinging.  Hot or cold.  Itching.  How is this diagnosed? No single test can diagnose neuropathic pain. Your health care provider will do a physical exam and ask you about your pain. You may use a pain scale to describe how bad your pain is. You may also have tests to see if you have a high sensitivity to pain and to help find the cause and location of any sensory nerve damage. These tests may include:  Imaging studies, such as: ? X-rays. ? CT scan. ? MRI.  Nerve conduction  studies to test how well nerve signals travel through your sensory nerves (electrodiagnostic testing).  Stimulating your sensory nerves through electrodes on your skin and measuring the response in your spinal cord and brain (somatosensory evoked potentials).  How is this treated? Treatment for neuropathic pain may change over time. You may need to try different treatment options or a combination of treatments. Some  options include:  Over-the-counter pain relievers.  Prescription medicines. Some medicines used to treat other conditions may also help neuropathic pain. These include medicines to: ? Control seizures (anticonvulsants). ? Relieve depression (antidepressants).  Prescription-strength pain relievers (narcotics). These are usually used when other pain relievers do not help.  Transcutaneous nerve stimulation (TENS). This uses electrical currents to block painful nerve signals. The treatment is painless.  Topical and local anesthetics. These are medicines that numb the nerves. They can be injected as a nerve block or applied to the skin.  Alternative treatments, such as: ? Acupuncture. ? Meditation. ? Massage. ? Physical therapy. ? Pain management programs. ? Counseling.  Follow these instructions at home:  Learn as much as you can about your condition.  Take medicines only as directed by your health care provider.  Work closely with all your health care providers to find what works best for you.  Have a good support system at home.  Consider joining a chronic pain support group. Contact a health care provider if:  Your pain treatments are not helping.  You are having side effects from your medicines.  You are struggling with fatigue, mood changes, depression, or anxiety. This information is not intended to replace advice given to you by your health care provider. Make sure you discuss any questions you have with your health care provider. Document Released: 06/22/2004 Document Revised: 04/14/2016 Document Reviewed: 03/05/2014 Elsevier Interactive Patient Education  [employer]2018 [employer]

## 2018-05-09 ENCOUNTER — Other Ambulatory Visit (INDEPENDENT_AMBULATORY_CARE_PROVIDER_SITE_OTHER): Payer: Medicare Other

## 2018-05-09 DIAGNOSIS — E785 Hyperlipidemia, unspecified: Secondary | ICD-10-CM

## 2018-05-09 DIAGNOSIS — I10 Essential (primary) hypertension: Secondary | ICD-10-CM

## 2018-05-09 DIAGNOSIS — E099 Drug or chemical induced diabetes mellitus without complications: Secondary | ICD-10-CM | POA: Diagnosis not present

## 2018-05-09 DIAGNOSIS — E559 Vitamin D deficiency, unspecified: Secondary | ICD-10-CM

## 2018-05-09 DIAGNOSIS — T380X5A Adverse effect of glucocorticoids and synthetic analogues, initial encounter: Secondary | ICD-10-CM | POA: Diagnosis not present

## 2018-05-09 DIAGNOSIS — E039 Hypothyroidism, unspecified: Secondary | ICD-10-CM | POA: Diagnosis not present

## 2018-05-09 LAB — TSH: TSH: 2.72 u[IU]/mL (ref 0.35–4.50)

## 2018-05-09 LAB — LIPID PANEL
Cholesterol: 208 mg/dL — ABNORMAL HIGH (ref 0–200)
HDL: 71.1 mg/dL (ref >39.00)
LDL CALC: 123 mg/dL — AB (ref 0–99)
NonHDL: 136.64
TRIGLYCERIDES: 66 mg/dL (ref 0.0–149.0)
Total CHOL/HDL Ratio: 3
VLDL: 13.2 mg/dL (ref 0.0–40.0)

## 2018-05-09 LAB — BASIC METABOLIC PANEL
BUN: 12 mg/dL (ref 6–23)
CHLORIDE: 104 meq/L (ref 96–112)
CO2: 26 mEq/L (ref 19–32)
CREATININE: 0.91 mg/dL (ref 0.40–1.20)
Calcium: 9.4 mg/dL (ref 8.4–10.5)
GFR: 78.54 mL/min (ref >60.00)
Glucose, Bld: 98 mg/dL (ref 70–99)
POTASSIUM: 4.1 meq/L (ref 3.5–5.1)
Sodium: 139 mEq/L (ref 135–145)

## 2018-05-09 LAB — MICROALBUMIN / CREATININE URINE RATIO
Creatinine,U: 144.8 mg/dL
Microalb Creat Ratio: 0.4 mg/g (ref 0.0–30.0)
Microalb, Ur: 0.7 mg/dL (ref 0.0–1.9)

## 2018-05-09 LAB — T4, FREE: Free T4: 1.09 ng/dL (ref 0.60–1.60)

## 2018-05-09 LAB — HEMOGLOBIN A1C: HEMOGLOBIN A1C: 5.3 % (ref 4.6–6.5)

## 2018-05-10 LAB — VITAMIN D 25 HYDROXY (VIT D DEFICIENCY, FRACTURES): VITD: 41.46 ng/mL (ref 30.00–100.00)

## 2018-05-13 ENCOUNTER — Telehealth: Payer: Self-pay | Admitting: Internal Medicine

## 2018-05-14 NOTE — Telephone Encounter (Signed)
1 prefilled syringe Ordered date: 05/13/18 Shipping date: 05/13/18

## 2018-05-14 NOTE — Telephone Encounter (Signed)
Arrival Date:05/14/18 Lot #: [id number] Exp date: 04/2019

## 2018-05-16 ENCOUNTER — Ambulatory Visit: Payer: Medicare Other

## 2018-05-17 ENCOUNTER — Telehealth: Payer: Self-pay

## 2018-05-17 ENCOUNTER — Ambulatory Visit: Payer: Self-pay | Admitting: *Deleted

## 2018-05-17 NOTE — Telephone Encounter (Signed)
Pt called with having diarrhea, abd pain and cough since starting on lisinopril. She thinks she is having a reaction to lisinopril. She denies having blood in her stool and has only had nausea once,no vomiting.  States stools are watery.  She has tried Du PontPepto bismol and felt like she was having more diarrhea with that. Advised to try imodium, over the counter. Encouraged her to drink more liquids,esp Gatorade. She checked her b/p and it was 170/103. Pt very agitated now so advised to sit quietly and recheck her  B/p 5 mins later.  rechecked and it is 137/81 HR 94. Home care advice given to patient with verbal understanding. Appointment scheduled for Monday per pt request.  Advised to go to UC or ED if the diarrhea gets worse and she gets weak, having vomiting or any other symptoms. Pt voiced understanding.  . Reason for Disposition . [1] SEVERE diarrhea (e.g., 7 or more times / day more than normal) AND [2] present > 24 hours (1 day)  Answer Assessment - Initial Assessment Questions 1. DIARRHEA SEVERITY: "How bad is the diarrhea?" "How many extra stools have you had in the past 24 hours than normal?"    - NO DIARRHEA (SCALE 0)   - MILD (SCALE 1-3): Few loose or mushy BMs; increase of 1-3 stools over normal daily number of stools; mild increase in ostomy output.   -  MODERATE (SCALE 4-7): Increase of 4-6 stools daily over normal; moderate increase in ostomy output. * SEVERE (SCALE 8-10; OR 'WORST POSSIBLE'): Increase of 7 or more stools daily over normal; moderate increase in ostomy output; incontinence.     moderate 2. ONSET: "When did the diarrhea begin?"      Tuesday 3. BM CONSISTENCY: "How loose or watery is the diarrhea?"      watery 4. VOMITING: "Are you also vomiting?" If so, ask: "How many times in the past 24 hours?"      nauseated once 5. ABDOMINAL PAIN: "Are you having any abdominal pain?" If yes: "What does it feel like?" (e.g., crampy, dull, intermittent, constant)      crampy  only when she needs to go to the bathroom 6. ABDOMINAL PAIN SEVERITY: If present, ask: "How bad is the pain?"  (e.g., Scale 1-10; mild, moderate, or severe)   - MILD (1-3): doesn't interfere with normal activities, abdomen soft and not tender to touch    - MODERATE (4-7): interferes with normal activities or awakens from sleep, tender to touch    - SEVERE (8-10): excruciating pain, doubled over, unable to do any normal activities       Pain # 4-7 7. ORAL INTAKE: If vomiting, "Have you been able to drink liquids?" "How much fluids have you had in the past 24 hours?"     yes 8. HYDRATION: "Any signs of dehydration?" (e.g., dry mouth [not just dry lips], too weak to stand, dizziness, new weight loss) "When did you last urinate?"     Dry mouth, last urinated half hour ago. 9. EXPOSURE: "Have you traveled to a foreign country recently?" "Have you been exposed to anyone with diarrhea?" "Could you have eaten any food that was spoiled?"     no 10. ANTIBIOTIC USE: "Are you taking antibiotics now or have you taken antibiotics in the past 2 months?"       no 11. OTHER SYMPTOMS: "Do you have any other symptoms?" (e.g., fever, blood in stool)       Felt feverish yesterday  Protocols used: DIARRHEA-A-AH

## 2018-05-17 NOTE — Telephone Encounter (Signed)
Copied from CRM [id number]. Topic: Inquiry >> May 17, 2018  3:25 PM Yvonna Alanisobinson, Andra M wrote: Reason for CRM: Patient called stating that she thinks she is having a reaction to lisinopril (PRINIVIL,ZESTRIL) 5 MG tablet. Patient states that she has Diarrhea and Weight Loss. Patient wants Alysia PennaCharlotte Nche to be made aware.Marland Kitchen..Marland Kitchen

## 2018-05-17 NOTE — Telephone Encounter (Signed)
Lisinopril is not known to have that side effect. Hold medication for 2days, if symptoms do not resolve, then we know it is not due to lisinopril. She should then resume medication and return to office for evaluation of diarrhea.

## 2018-05-17 NOTE — Telephone Encounter (Signed)
Noted  

## 2018-05-20 ENCOUNTER — Ambulatory Visit: Payer: Medicare Other | Admitting: Nurse Practitioner

## 2018-05-20 MED ORDER — AMLODIPINE BESYLATE 5 MG PO TABS: 5.0000 mg | ORAL_TABLET | Freq: Every day | ORAL | 1 refills | Status: DC

## 2018-05-20 NOTE — Telephone Encounter (Signed)
LMOVM stating that Rx is being changed to something else and not to take the current one anymore and I am adding it to the allergy list/told to take 1qhs of the new medication and was being sent in for #30 with 1 refill/said to call if has any questions/note to pharmacy stating Deactivate Lisinopril/Start Amlodipine/thx dmf

## 2018-05-20 NOTE — Telephone Encounter (Signed)
Stop lisinopril, add to allergy list. Send amlodipine 5mg  [facility], # 30tabs with 1refill

## 2018-05-20 NOTE — Telephone Encounter (Signed)
CN-I spoke to pt/after not taking Lisinopril for the weekend the diarrhea is gone/Plz advise if you would like for her to try an alternate medication/thx dmf

## 2018-05-23 ENCOUNTER — Ambulatory Visit (INDEPENDENT_AMBULATORY_CARE_PROVIDER_SITE_OTHER): Payer: Medicare Other

## 2018-05-23 DIAGNOSIS — J4550 Severe persistent asthma, uncomplicated: Secondary | ICD-10-CM

## 2018-05-23 MED ORDER — BENRALIZUMAB 30 MG/ML ~~LOC~~ SOSY
30.0000 mg | PREFILLED_SYRINGE | Freq: Once | SUBCUTANEOUS | Status: AC
  Administered 2018-05-23: 30 mg via SUBCUTANEOUS

## 2018-05-23 NOTE — Progress Notes (Signed)
Documentation of medication administration and charges of Fasenra have been completed by Miqueas Whilden, CMA based on the Fasenra documentation sheet completed by Tammy Scott.   

## 2018-05-29 ENCOUNTER — Other Ambulatory Visit: Payer: Self-pay | Admitting: Nurse Practitioner

## 2018-05-29 DIAGNOSIS — I10 Essential (primary) hypertension: Secondary | ICD-10-CM

## 2018-06-14 ENCOUNTER — Other Ambulatory Visit: Payer: Self-pay | Admitting: Nurse Practitioner

## 2018-06-18 ENCOUNTER — Ambulatory Visit (INDEPENDENT_AMBULATORY_CARE_PROVIDER_SITE_OTHER): Payer: Medicare Other | Admitting: Internal Medicine

## 2018-06-18 ENCOUNTER — Other Ambulatory Visit: Payer: Self-pay | Admitting: Internal Medicine

## 2018-06-18 ENCOUNTER — Encounter: Payer: Self-pay | Admitting: Internal Medicine

## 2018-06-18 VITALS — BP 148/90 | HR 78 | Ht 66.0 in | Wt 219.0 lb

## 2018-06-18 DIAGNOSIS — J45909 Unspecified asthma, uncomplicated: Secondary | ICD-10-CM

## 2018-06-18 MED ORDER — ALBUTEROL SULFATE 0.63 MG/3ML IN NEBU: 1.0000 | INHALATION_SOLUTION | Freq: Four times a day (QID) | RESPIRATORY_TRACT | 0 refills | Status: DC | PRN

## 2018-06-18 MED ORDER — ALBUTEROL SULFATE HFA 108 (90 BASE) MCG/ACT IN AERS: INHALATION_SPRAY | RESPIRATORY_TRACT | 11 refills | Status: DC

## 2018-06-18 NOTE — Progress Notes (Signed)
MRN# [id number] Rachael Silva May 27, 1946  synopsis: 72 year old female past medical history of asthma. Being followed by pulmonary for chronic eosinophilic pneumonia, status post bronchoscopy in December 2016 with negative cultures for AFB and fungus. Transbronchial biopsy-positive for eosinophilic infiltrate. Treated with steroids unable to wean since  October 2015. CC: Chief Complaint  Patient presents with  . Follow-up    dry cough:       CC follow up ASTHMA HPI Patient presents today for follow-up visit of her ABPA, asthma, chronic prednisone use secondary to lung disease.  Since her last visit she's not had any further exacerbations of eosinophilic pneumonia or ABPA. started Fesanra Injections 1 month ago Has dry cough ad intermittent wheezing Still surrounded by cats  No acute signs of infection at this time No wheezing no shortness of breath No dyspnea on exertion  +exposure to cats/carpet-she moved to daughters house and noticed a difference in her symptoms  She is constantly being exposed to allergens and cats at home  after further discussion, patient is constantly exposed to cat dander and thick carpet I have advised that she is always being exposed to triggers.  She is on Wixela, spiriva,alb INH and NEB as needed, zyrtec And Singulair  Review of Systems: Gen:  Denies  fever, sweats, chills HEENT: Denies blurred vision, double vision, ear pain, eye pain, hearing loss, nose bleeds, sore throat Cvc:  No dizziness, chest pain or heaviness Resp:  +wheezes -shortness of breath +cough Gi: Denies swallowing difficulty, stomach pain, nausea or vomiting, diarrhea, constipation, bowel incontinence Gu:  Denies bladder incontinence, burning urine Ext:   No Joint pain, stiffness or swelling Skin: No skin rash, easy bruising or bleeding or hives Endoc:  No polyuria, polydipsia , polyphagia or weight change Other:  All other systems negative  Allergies:  Lisinopril;  Atorvastatin; and Erythromycin  Physical Examination:  VS: Ht 5\' 6"  (1.676 m)   Wt 219 lb (99.3 kg)   BMI 35.35 kg/m   Ht 5\' 6"  (1.676 m)   Wt 219 lb (99.3 kg)   BMI 35.35 kg/m  BP (!) 148/90 (BP Location: Left Arm, Cuff Size: Normal)   Pulse 78   Ht 5\' 6"  (1.676 m)   Wt 219 lb (99.3 kg)   SpO2 97%   BMI 35.35 kg/m   General Appearance: No distress  HEENT: PERRLA, no ptosis, no other lesions noticed. No bruits Pulmonary:normal breath sounds., diaphragmatic excursion normal.No wheezing, No rales   Cardiovascular:  Normal S1,S2.  No m/r/g.     Abdomen:Exam: Benign, Soft, non-tender, No masses  Skin:   warm, no rashes, no ecchymosis  Extremities: normal, no cyanosis, clubbing, warm with normal capillary refill.     LABS Results for Rachael, Silva (MRN [id number]) as of 08/01/2016 11:16  Ref. Range 07/02/2014 13:00 08/05/2015 15:12 03/30/2016 15:11  IgE (Immunoglobulin E), Serum Latest Ref Range: 0 - 100 IU/mL 2,401 (H) 1,253 (H) 1,180 (H)   Results for Rachael, Silva (MRN [id number]) as of 08/01/2016 11:16  Ref. Range 08/05/2015 15:12 09/16/2015 12:46 03/30/2016 15:11 05/03/2016 08:42 07/26/2016 15:31  Eosinophil Latest Units: % 10 11 5  5.4 (H) 3        Assessment and Plan:   72 year old female with chronic cough, recurrent URIs, being treated as ABPA/chronic eosinophilic pneumonia, now off of  Xolair therapy due to rash and allergic reaction which was being suppressed by chronic prednisone therapy which the patient wants to restart now at 5 mg daily. My goal was to take away prednisone  but patient wants prednisone therapy it. I have advised the major risk factors of prednisone therapy again and also have strongly suggested to move away from her current living conditions    Asthma, chronic/chronic eosinophilic pneumonia -Patient has severe persistent  chronic asthma which seemed to be better controlled  with Miles Costain and inhlaer therapy  Plan -STOP PREDNISONE AT THIS  TIME -Avoid any allergens-she is constantly around cats and dogs along with carpet I have advised patient that she needs to make choices with her environmental exposures-remove cats and remove carpets - she has reactive airways disease from all dust Continue Advair daily,  Spiriva Continue allergy regiment and allergy shot Avoid sick contacts.   ABPA (allergic bronchopulmonary aspergillosis) Patient educated on side effects of steroids (weight gain, wereretention, increased appetite, muscle weakness, bone degradation, dysfunctional calcium levels, elevated blood glucose levels, anxiety/agitation at night, decreased immune). -will not restart prednisone at this time  Patient educated on the current diagnosis, she understands that this is not an active infection, it is most likely a hyperreactive reaction to antigen exposure.  She does not need/require antifungal treatment at this time. This is her initial treatment course, if she has refractory or recurrent symptoms will consider adding antifungals at that time.  Bronchoscopy 09/2015: All cultures including fungal, bacterial, AFB a currently negative to date. Transbronchial biopsy showed eosinophilic infiltration consistent with eosinophilic pneumonia in 2016 Bronhoscopy with BAL 03/2016: negative cultures.    OSA on CPAP Cont with CPAP nightly CPAP 11cm H20 Compliance report shows 77% at 30 days as well as 67% for more than 4 hours a day Her AHI is 0.3she is on auto CPAP pressure 11-15 7 m of water pressure We will need compliance report at next OV   Patient satisfied with Plan of action and management. All questions answered Follow-up in 3 months for assessment resp status  Lynford Espinoza Rachael Silva, M.D.  [facility]  Medical Director [facility] Medical Director [facility] Cardio-Pulmonary Department

## 2018-06-18 NOTE — Patient Instructions (Signed)
1.stop prednisone 2.continue inhalers as prescribed 3.ALB nebs and INH every 4 hrs as needed for cough/wheezing. 4.continue Fesanra injections   AVOID TRIGGERS

## 2018-06-19 ENCOUNTER — Other Ambulatory Visit: Payer: Self-pay | Admitting: Nurse Practitioner

## 2018-06-20 ENCOUNTER — Telehealth: Payer: Self-pay | Admitting: Internal Medicine

## 2018-06-20 NOTE — Telephone Encounter (Signed)
1 prefilled syringe Ordered date: 06/20/18 Shipping date: 06/20/18

## 2018-06-21 NOTE — Telephone Encounter (Signed)
Arrival Date:06/21/18 Lot #: [id number] Exp date: 03/2020

## 2018-06-27 ENCOUNTER — Ambulatory Visit (INDEPENDENT_AMBULATORY_CARE_PROVIDER_SITE_OTHER): Payer: Medicare Other

## 2018-06-27 DIAGNOSIS — J4550 Severe persistent asthma, uncomplicated: Secondary | ICD-10-CM

## 2018-06-27 MED ORDER — BENRALIZUMAB 30 MG/ML ~~LOC~~ SOSY
30.0000 mg | PREFILLED_SYRINGE | Freq: Once | SUBCUTANEOUS | Status: AC
  Administered 2018-06-27: 30 mg via SUBCUTANEOUS

## 2018-06-27 MED ORDER — BENRALIZUMAB 30 MG/ML ~~LOC~~ SOSY: 30.0000 mg | PREFILLED_SYRINGE | Freq: Once | SUBCUTANEOUS | Status: DC

## 2018-06-27 NOTE — Progress Notes (Signed)
Documentation of medication administration and charges of Fasenra have been completed by Lindsay Lemons, CMA based on the hand written Fasenra documentation sheet completed by Tammy Scott, who administered the medication.  

## 2018-06-30 NOTE — Progress Notes (Signed)
[facility]  [phone] [phone]  ID: Rachael Silva OB: [birth year]  MR#: [id number]  AOZ#:[id number]  Patient Care Team: Anne Ng, NP as PCP - General (Internal Medicine) Dayna Barker, MD as Referring Physician (Internal Medicine) Nche, Bonna Gains, NP as Nurse Practitioner (Internal Medicine)  CHIEF COMPLAINT: Eosinophilia  INTERVAL HISTORY: Patient returns to clinic today for repeat laboratory work and further evaluation.  She continues to feel well and remains asymptomatic.  She has some ongoing neuropathy in her feet, but no other neurologic complaints.  She denies any recent fevers or illnesses.  She has a good appetite and denies weight loss.  She denies any night sweats.  She has no chest pain or shortness of breath.  She denies any nausea, vomiting, constipation, or diarrhea.  She has no urinary complaints.  Patient feels at her baseline offers no specific complaints today.  REVIEW OF SYSTEMS:   Review of Systems  Constitutional: Positive for weight loss. Negative for fever and malaise/fatigue.  Respiratory: Negative.  Negative for cough and shortness of breath.   Cardiovascular: Negative.  Negative for chest pain and leg swelling.  Gastrointestinal: Negative.  Negative for abdominal pain.  Genitourinary: Negative.  Negative for dysuria.  Musculoskeletal: Negative.  Negative for myalgias.  Skin: Negative.  Negative for rash.  Neurological: Positive for sensory change. Negative for focal weakness, weakness and headaches.  Endo/Heme/Allergies: Negative for environmental allergies.  Psychiatric/Behavioral: The patient is nervous/anxious.     As per HPI. Otherwise, a complete review of systems is negative.  PAST MEDICAL HISTORY: Past Medical History:  Diagnosis Date  . Allergy    allergic rhinitis  . Angioedema 02/2006  . Arthritis    OA of knees  . Asthma   . Complication of anesthesia    apnea- sleep  .  Frequent headaches   . Hiatal hernia   . Hypertension   . Hypothyroid   . Labile blood pressure   . Lower extremity edema   . Lung disease   . Pneumonia   . Pneumonia, eosinophilic (HCC)   . Shortness of breath dyspnea   . Sleep apnea   . Urinary incontinence   . Wheezing     PAST SURGICAL HISTORY: Past Surgical History:  Procedure Laterality Date  . BREAST BIOPSY Left 2013   NEG  . BREAST SURGERY  1966   breast biopsy  . CESAREAN SECTION     x2  . FLEXIBLE BRONCHOSCOPY N/A 09/16/2015   Procedure: FLEXIBLE BRONCHOSCOPY WITH FLURO ;  Surgeon: Stephanie Acre, MD;  Location: [facility] ORS;  Service: Pulmonary;  Laterality: N/A;    FAMILY HISTORY: Family History  Problem Relation Age of Onset  . Diabetes Mother        pre-diabetic  . Heart disease Mother        ? heart disease  . Emphysema Mother   . Thyroid disease Mother   . Diabetes Maternal Uncle   . Heart disease Father   . Breast cancer Neg Hx     ADVANCED DIRECTIVES (Y/N):  N  HEALTH MAINTENANCE: Social History   Tobacco Use  . Smoking status: Never Smoker  . Smokeless tobacco: Never Used  Substance Use Topics  . Alcohol use: No    Alcohol/week: 0.0 standard drinks  . Drug use: No     Colonoscopy:  PAP:  Bone density:  Lipid panel:  Allergies  Allergen Reactions  . Lisinopril Diarrhea  . Atorvastatin     Muscle pain   .  Erythromycin     REACTION: feels sick    Current Outpatient Medications  Medication Sig Dispense Refill  . albuterol (ACCUNEB) 0.63 MG/3ML nebulizer solution Take 3 mLs (0.63 mg total) by nebulization every 6 (six) hours as needed for wheezing or shortness of breath. 75 mL 0  . albuterol (VENTOLIN HFA) 108 (90 Base) MCG/ACT inhaler TAKE 1 TO 2 PUFFS EVERY 6 HOURS AS NEEDED 18 g 11  . amLODipine (NORVASC) 5 MG tablet TAKE 1 TABLET BY MOUTH EVERYDAY AT BEDTIME 30 tablet 2  . B Complex-C-Folic Acid (STRESS B COMPLEX PO) Take 1 tablet by mouth daily.    . Calcium Carbonate-Vitamin D  (CALTRATE 600+D PO) Take 1 tablet by mouth 2 (two) times daily.    . cetirizine (ZYRTEC ALLERGY) 10 MG tablet Take 1 tablet (10 mg total) by mouth daily. 30 tablet 6  . co-enzyme Q-10 30 MG capsule Take 200 mg by mouth daily.    Marland Kitchen docusate sodium (COLACE) 100 MG capsule Take 1 capsule (100 mg total) by mouth 2 (two) times daily. 10 capsule 0  . Fluticasone-Salmeterol (ADVAIR DISKUS) 250-50 MCG/DOSE AEPB Inhale 1 puff into the lungs 2 (two) times daily.     . furosemide (LASIX) 20 MG tablet Take 0.5 tablets (10 mg total) by mouth daily. Need office visit for additional refills 45 tablet 2  . gabapentin (NEURONTIN) 100 MG capsule Take 1 capsule (100 mg total) by mouth 2 (two) times daily. 60 capsule 2  . GARLIC PO Take 1 capsule by mouth daily.    Marland Kitchen HYDROcodone-acetaminophen (NORCO/VICODIN) 5-325 MG tablet Take 1-2 tablets by mouth every 6 (six) hours as needed for moderate pain or severe pain. 30 tablet 0  . montelukast (SINGULAIR) 10 MG tablet TAKE 1 TABLET BY MOUTH EVERY DAY 90 tablet 1  . Multiple Vitamins-Minerals (CENTRUM ADULTS PO) Take 1 tablet by mouth daily.    Marland Kitchen omeprazole (PRILOSEC) 40 MG capsule Take 1 capsule (40 mg total) by mouth daily. (Patient taking differently: Take 20 mg by mouth daily. ) 30 capsule 5  . Tiotropium Bromide Monohydrate (SPIRIVA RESPIMAT) 1.25 MCG/ACT AERS Inhale 2 puffs into the lungs 2 (two) times daily. 1 Inhaler 5  . triamcinolone ointment (KENALOG) 0.1 % Apply 1 application topically 2 (two) times daily. 80 g 0  . diphenhydrAMINE (BENADRYL ALLERGY) 25 MG tablet Take 1 tablet (25 mg total) by mouth every 8 (eight) hours for 1 day, THEN 1 tablet (25 mg total) every 12 (twelve) hours for 2 days, THEN 1 tablet (25 mg total) every 8 (eight) hours as needed for up to 2 days for itching. 30 tablet 0  . EPIPEN 2-PAK 0.3 MG/0.3ML SOAJ injection See admin instructions.  99  . ibuprofen (ADVIL,MOTRIN) 800 MG tablet Take 1 tablet (800 mg total) by mouth every 8 (eight)  hours as needed. (Patient not taking: Reported on 07/01/2018) 30 tablet 0  . levothyroxine (SYNTHROID, LEVOTHROID) 50 MCG tablet TAKE 1 TABLET BY MOUTH EVERY DAY (Patient not taking: Reported on 07/01/2018) 30 tablet 2  . ONE TOUCH ULTRA TEST test strip USE TO TEST TWICE DAILY DX E09.9 (Patient not taking: Reported on 07/01/2018) 100 each 0  . ONETOUCH DELICA LANCETS 33G MISC USE TO TEST SUGARS TWICE DAILY DX E09.9 (Patient not taking: Reported on 07/01/2018) 100 each 0   Current Facility-Administered Medications  Medication Dose Route Frequency Provider Last Rate Last Dose  . omalizumab Geoffry Paradise) injection 375 mg  375 mg Subcutaneous Q14 Days Erin Fulling, MD  375 mg at 08/16/17 1249  . omalizumab Geoffry Paradise(XOLAIR) injection 375 mg  375 mg Subcutaneous Q14 Days Erin FullingKasa, Kurian, MD   375 mg at 09/25/17 1638  . omalizumab Geoffry Paradise(XOLAIR) injection 375 mg  375 mg Subcutaneous Q14 Days Erin FullingKasa, Kurian, MD   375 mg at 10/11/17 1625    OBJECTIVE: Vitals:   07/01/18 1458  BP: (!) 171/81  Pulse: 85  Resp: 18  Temp: (!) 97.2 F (36.2 C)     Body mass index is 35.3 kg/m.    ECOG FS:0 - Asymptomatic  General: Well-developed, well-nourished, no acute distress. Eyes: Pink conjunctiva, anicteric sclera. HEENT: Normocephalic, moist mucous membranes. Lungs: Clear to auscultation bilaterally. Heart: Regular rate and rhythm. No rubs, murmurs, or gallops. Abdomen: Soft, nontender, nondistended. No organomegaly noted, normoactive bowel sounds. Musculoskeletal: No edema, cyanosis, or clubbing. Neuro: Alert, answering all questions appropriately. Cranial nerves grossly intact. Skin: No rashes or petechiae noted. Psych: Normal affect.  LAB RESULTS:  Lab Results  Component Value Date   NA 139 05/09/2018   K 4.1 05/09/2018   CL 104 05/09/2018   CO2 26 05/09/2018   GLUCOSE 98 05/09/2018   BUN 12 05/09/2018   CREATININE 0.91 05/09/2018   CALCIUM 9.4 05/09/2018   PROT 7.8 02/25/2018   ALBUMIN 3.1 (L) 02/25/2018   AST 60  (H) 02/25/2018   ALT 61 (H) 02/25/2018   ALKPHOS 277 (H) 02/25/2018   BILITOT 0.7 02/25/2018   GFRNONAA >60 02/25/2018   GFRAA >60 02/25/2018    Lab Results  Component Value Date   WBC 3.8 07/01/2018   NEUTROABS 1.3 (L) 07/01/2018   HGB 13.9 07/01/2018   HCT 41.0 07/01/2018   MCV 84.4 07/01/2018   PLT 209 07/01/2018     STUDIES: No results found.  ASSESSMENT: Eosinophilia  PLAN:    1. Eosinophilia: Patient's white blood cell count as well as her eosinophil count is now within normal limits.  Previously, she was noted to have a positive ANA, but was evaluated by rheumatology and this was thought to be clinically insignificant. She has a mildly elevated IgG level, but IgA and IgM are within normal limits.  IgE levels are elevated, but this is chronic and unchanged.  Tryptase, flow cytometry, Jak 2 mutation, and BCR-ABL are all negative or within normal limits.  No intervention is needed at this time.  After discussion with the patient, it was agreed upon that no further follow-up is necessary.  Please refer patient back if there are any questions or concerns. 2.  Weight loss: Unclear etiology.  Continue monitoring and evaluation per primary care.    I spent a total of 20 minutes face-to-face with the patient of which greater than 50% of the visit was spent in counseling and coordination of care as detailed above.  Patient expressed understanding and was in agreement with this plan. She also understands that She can call clinic at any time with any questions, concerns, or complaints.    Jeralyn Ruthsimothy J Finnegan, MD   07/05/2018 1:09 PM

## 2018-07-01 ENCOUNTER — Inpatient Hospital Stay (HOSPITAL_BASED_OUTPATIENT_CLINIC_OR_DEPARTMENT_OTHER): Payer: Medicare Other | Admitting: Oncology

## 2018-07-01 ENCOUNTER — Inpatient Hospital Stay: Payer: Medicare Other | Attending: Oncology

## 2018-07-01 ENCOUNTER — Other Ambulatory Visit: Payer: Self-pay

## 2018-07-01 VITALS — BP 171/81 | HR 85 | Temp 97.2°F | Resp 18 | Wt 218.7 lb

## 2018-07-01 DIAGNOSIS — R768 Other specified abnormal immunological findings in serum: Secondary | ICD-10-CM | POA: Insufficient documentation

## 2018-07-01 DIAGNOSIS — D721 Eosinophilia, unspecified: Secondary | ICD-10-CM

## 2018-07-01 DIAGNOSIS — G629 Polyneuropathy, unspecified: Secondary | ICD-10-CM

## 2018-07-01 DIAGNOSIS — R634 Abnormal weight loss: Secondary | ICD-10-CM | POA: Insufficient documentation

## 2018-07-01 LAB — CBC WITH DIFFERENTIAL/PLATELET
BASOS ABS: 0 10*3/uL (ref 0–0.1)
BASOS PCT: 0 %
Eosinophils Absolute: 0 10*3/uL (ref 0–0.7)
Eosinophils Relative: 0 %
HEMATOCRIT: 41 % (ref 35.0–47.0)
HEMOGLOBIN: 13.9 g/dL (ref 12.0–16.0)
Lymphocytes Relative: 56 %
Lymphs Abs: 2.1 10*3/uL (ref 1.0–3.6)
MCH: 28.6 pg (ref 26.0–34.0)
MCHC: 33.9 g/dL (ref 32.0–36.0)
MCV: 84.4 fL (ref 80.0–100.0)
Monocytes Absolute: 0.4 10*3/uL (ref 0.2–0.9)
Monocytes Relative: 10 %
NEUTROS ABS: 1.3 10*3/uL — AB (ref 1.4–6.5)
Neutrophils Relative %: 34 %
Platelets: 209 10*3/uL (ref 150–440)
RBC: 4.86 MIL/uL (ref 3.80–5.20)
RDW: 15.1 % — ABNORMAL HIGH (ref 11.5–14.5)
WBC: 3.8 10*3/uL (ref 3.6–11.0)

## 2018-07-01 NOTE — Progress Notes (Signed)
Here for follow up. Overall pt stated she is feeling "ok " c/o foot pain-neuropathy she stated on going.

## 2018-07-03 ENCOUNTER — Ambulatory Visit: Payer: Medicare Other | Admitting: Oncology

## 2018-07-03 ENCOUNTER — Other Ambulatory Visit: Payer: Medicare Other

## 2018-07-09 ENCOUNTER — Ambulatory Visit: Payer: Medicare Other | Admitting: Neurology

## 2018-07-16 ENCOUNTER — Ambulatory Visit (INDEPENDENT_AMBULATORY_CARE_PROVIDER_SITE_OTHER): Payer: Medicare Other | Admitting: Behavioral Health

## 2018-07-16 DIAGNOSIS — Z23 Encounter for immunization: Secondary | ICD-10-CM | POA: Diagnosis not present

## 2018-07-16 NOTE — Progress Notes (Signed)
Patient came in clinic today for influenza vaccination. IM injection was given in the right deltoid. Patient tolerated the injection well. No s/s of a reaction were noted. Vaccine info sheet given to the patient before leaving the visit.

## 2018-07-23 ENCOUNTER — Other Ambulatory Visit: Payer: Self-pay | Admitting: Family Medicine

## 2018-07-23 DIAGNOSIS — L509 Urticaria, unspecified: Secondary | ICD-10-CM

## 2018-07-23 NOTE — Telephone Encounter (Signed)
Forwarding to PCP.

## 2018-07-29 DIAGNOSIS — J309 Allergic rhinitis, unspecified: Secondary | ICD-10-CM | POA: Diagnosis not present

## 2018-07-29 DIAGNOSIS — J45991 Cough variant asthma: Secondary | ICD-10-CM | POA: Diagnosis not present

## 2018-07-31 DIAGNOSIS — J301 Allergic rhinitis due to pollen: Secondary | ICD-10-CM | POA: Diagnosis not present

## 2018-08-01 DIAGNOSIS — J301 Allergic rhinitis due to pollen: Secondary | ICD-10-CM | POA: Diagnosis not present

## 2018-08-05 DIAGNOSIS — J301 Allergic rhinitis due to pollen: Secondary | ICD-10-CM | POA: Diagnosis not present

## 2018-08-07 ENCOUNTER — Ambulatory Visit: Payer: Medicare Other | Admitting: Nurse Practitioner

## 2018-08-08 DIAGNOSIS — J301 Allergic rhinitis due to pollen: Secondary | ICD-10-CM | POA: Diagnosis not present

## 2018-08-12 ENCOUNTER — Encounter: Payer: Self-pay | Admitting: Nurse Practitioner

## 2018-08-12 ENCOUNTER — Ambulatory Visit (INDEPENDENT_AMBULATORY_CARE_PROVIDER_SITE_OTHER): Payer: Medicare Other | Admitting: Nurse Practitioner

## 2018-08-12 ENCOUNTER — Telehealth: Payer: Self-pay | Admitting: Internal Medicine

## 2018-08-12 VITALS — BP 132/80 | HR 75 | Temp 98.6°F | Ht 66.0 in | Wt 226.0 lb

## 2018-08-12 DIAGNOSIS — I10 Essential (primary) hypertension: Secondary | ICD-10-CM

## 2018-08-12 DIAGNOSIS — G629 Polyneuropathy, unspecified: Secondary | ICD-10-CM | POA: Diagnosis not present

## 2018-08-12 DIAGNOSIS — T380X5A Adverse effect of glucocorticoids and synthetic analogues, initial encounter: Secondary | ICD-10-CM

## 2018-08-12 DIAGNOSIS — K219 Gastro-esophageal reflux disease without esophagitis: Secondary | ICD-10-CM

## 2018-08-12 DIAGNOSIS — Z1211 Encounter for screening for malignant neoplasm of colon: Secondary | ICD-10-CM | POA: Diagnosis not present

## 2018-08-12 DIAGNOSIS — E099 Drug or chemical induced diabetes mellitus without complications: Secondary | ICD-10-CM

## 2018-08-12 DIAGNOSIS — J301 Allergic rhinitis due to pollen: Secondary | ICD-10-CM | POA: Diagnosis not present

## 2018-08-12 LAB — POCT GLYCOSYLATED HEMOGLOBIN (HGB A1C): HEMOGLOBIN A1C: 5.7 % — AB (ref 4.0–5.6)

## 2018-08-12 MED ORDER — GABAPENTIN 100 MG PO CAPS: 100.0000 mg | ORAL_CAPSULE | Freq: Two times a day (BID) | ORAL | 2 refills | Status: DC

## 2018-08-12 MED ORDER — FUROSEMIDE 20 MG PO TABS: 10.0000 mg | ORAL_TABLET | Freq: Every day | ORAL | 2 refills | Status: DC

## 2018-08-12 MED ORDER — IBUPROFEN 800 MG PO TABS: 800.0000 mg | ORAL_TABLET | Freq: Three times a day (TID) | ORAL | 0 refills | Status: DC | PRN

## 2018-08-12 MED ORDER — OMEPRAZOLE 20 MG PO CPDR: 20.0000 mg | DELAYED_RELEASE_CAPSULE | Freq: Every day | ORAL | Status: AC

## 2018-08-12 NOTE — Patient Instructions (Addendum)
Please appt for mammogram and ophthalmology appt.  Maintain appt with neurology.  Start gabapentin 1cap at bedtime x7days, then increase to 1cap every 12hrs.

## 2018-08-12 NOTE — Progress Notes (Signed)
Subjective:  Patient ID: Rachael Silva, female    DOB: [birth year]  Age: 72 y.o. MRN: [id number]  CC: Follow-up (3 mo fu/FYi: will make an appt with eye doc. cologuard? MM due in 12/2017)  HPI  HTN: Home BP: 120-110/70s Did not take amlodipine as prescribed. Was unable to tolerate lisinopril (diarrhea). Current use of furosemide 10mg  daily BP Readings from Last 3 Encounters:  08/12/18 132/80  07/01/18 (!) 171/81  06/18/18 (!) 148/90   Neuropathy: Chronic, persistent, mild improvement with ibuprofen 800mg  1-2times a day. Did not take gabapentin as prescribed. Upcoming appt with neurology.  Weight has improved in last 3months. Rachael Silva gained 8lbs. No change in appetite or BM. Has difficulty exercising due to leg pain and neuropathy,  Reviewed past Medical, Social and Family history today.  Outpatient Medications Prior to Visit  Medication Sig Dispense Refill  . albuterol (ACCUNEB) 0.63 MG/3ML nebulizer solution Take 3 mLs (0.63 mg total) by nebulization every 6 (six) hours as needed for wheezing or shortness of breath. 75 mL 0  . albuterol (VENTOLIN HFA) 108 (90 Base) MCG/ACT inhaler TAKE 1 TO 2 PUFFS EVERY 6 HOURS AS NEEDED 18 g 11  . B Complex-C-Folic Acid (STRESS B COMPLEX PO) Take 1 tablet by mouth daily.    . Calcium Carbonate-Vitamin D (CALTRATE 600+D PO) Take 1 tablet by mouth 2 (two) times daily.    . cetirizine (ZYRTEC ALLERGY) 10 MG tablet Take 1 tablet (10 mg total) by mouth daily. 30 tablet 6  . co-enzyme Q-10 30 MG capsule Take 200 mg by mouth daily.    Marland Kitchen EPIPEN 2-PAK 0.3 MG/0.3ML SOAJ injection See admin instructions.  99  . Fluticasone-Salmeterol (ADVAIR DISKUS) 250-50 MCG/DOSE AEPB Inhale 1 puff into the lungs 2 (two) times daily.     Marland Kitchen GARLIC PO Take 1 capsule by mouth daily.    . montelukast (SINGULAIR) 10 MG tablet TAKE 1 TABLET BY MOUTH EVERY DAY 90 tablet 1  . Multiple Vitamins-Minerals (CENTRUM ADULTS PO) Take 1 tablet by mouth daily.    . ONE TOUCH ULTRA  TEST test strip USE TO TEST TWICE DAILY DX E09.9 100 each 0  . ONETOUCH DELICA LANCETS 33G MISC USE TO TEST SUGARS TWICE DAILY DX E09.9 100 each 0  . Tiotropium Bromide Monohydrate (SPIRIVA RESPIMAT) 1.25 MCG/ACT AERS Inhale 2 puffs into the lungs 2 (two) times daily. 1 Inhaler 5  . triamcinolone ointment (KENALOG) 0.1 % APPLY TO AFFECTED AREA TWICE A DAY 80 g 0  . furosemide (LASIX) 20 MG tablet Take 0.5 tablets (10 mg total) by mouth daily. Need office visit for additional refills 45 tablet 2  . ibuprofen (ADVIL,MOTRIN) 800 MG tablet Take 1 tablet (800 mg total) by mouth every 8 (eight) hours as needed. 30 tablet 0  . levothyroxine (SYNTHROID, LEVOTHROID) 50 MCG tablet TAKE 1 TABLET BY MOUTH EVERY DAY 30 tablet 2  . omeprazole (PRILOSEC) 40 MG capsule Take 1 capsule (40 mg total) by mouth daily. (Patient taking differently: Take 20 mg by mouth daily. ) 30 capsule 5  . diphenhydrAMINE (BENADRYL ALLERGY) 25 MG tablet Take 1 tablet (25 mg total) by mouth every 8 (eight) hours for 1 day, THEN 1 tablet (25 mg total) every 12 (twelve) hours for 2 days, THEN 1 tablet (25 mg total) every 8 (eight) hours as needed for up to 2 days for itching. 30 tablet 0  . amLODipine (NORVASC) 5 MG tablet TAKE 1 TABLET BY MOUTH EVERYDAY AT BEDTIME (Patient not taking: Reported  on 08/12/2018) 30 tablet 2  . docusate sodium (COLACE) 100 MG capsule Take 1 capsule (100 mg total) by mouth 2 (two) times daily. (Patient not taking: Reported on 08/12/2018) 10 capsule 0  . gabapentin (NEURONTIN) 100 MG capsule Take 1 capsule (100 mg total) by mouth 2 (two) times daily. (Patient not taking: Reported on 08/12/2018) 60 capsule 2  . HYDROcodone-acetaminophen (NORCO/VICODIN) 5-325 MG tablet Take 1-2 tablets by mouth every 6 (six) hours as needed for moderate pain or severe pain. (Patient not taking: Reported on 08/12/2018) 30 tablet 0   Facility-Administered Medications Prior to Visit  Medication Dose Route Frequency Provider Last Rate Last  Dose  . omalizumab Geoffry Paradise) injection 375 mg  375 mg Subcutaneous Q14 Days Erin Fulling, MD   375 mg at 08/16/17 1249  . omalizumab Geoffry Paradise) injection 375 mg  375 mg Subcutaneous Q14 Days Erin Fulling, MD   375 mg at 09/25/17 1638  . omalizumab Geoffry Paradise) injection 375 mg  375 mg Subcutaneous Q14 Days Erin Fulling, MD   375 mg at 10/11/17 1625    ROS See HPI  Objective:  BP 132/80   Pulse 75   Temp 98.6 F (37 C) (Oral)   Ht 5\' 6"  (1.676 m)   Wt 226 lb (102.5 kg)   SpO2 96%   BMI 36.48 kg/m   BP Readings from Last 3 Encounters:  08/12/18 132/80  07/01/18 (!) 171/81  06/18/18 (!) 148/90    Wt Readings from Last 3 Encounters:  08/12/18 226 lb (102.5 kg)  07/01/18 218 lb 11.2 oz (99.2 kg)  06/18/18 219 lb (99.3 kg)    Physical Exam  Constitutional: Rachael Silva is oriented to person, place, and time.  Cardiovascular: Normal rate, regular rhythm, normal heart sounds and intact distal pulses.  Pulmonary/Chest: Effort normal and breath sounds normal.  Musculoskeletal: Rachael Silva exhibits no edema.  Neurological: Rachael Silva is alert and oriented to person, place, and time.  Psychiatric: Rachael Silva has a normal mood and affect. Rachael Silva behavior is normal. Thought content normal.  Vitals reviewed.   Lab Results  Component Value Date   WBC 3.8 07/01/2018   HGB 13.9 07/01/2018   HCT 41.0 07/01/2018   PLT 209 07/01/2018   GLUCOSE 98 05/09/2018   CHOL 208 (H) 05/09/2018   TRIG 66.0 05/09/2018   HDL 71.10 05/09/2018   LDLDIRECT 138.9 04/07/2011   LDLCALC 123 (H) 05/09/2018   ALT 61 (H) 02/25/2018   AST 60 (H) 02/25/2018   NA 139 05/09/2018   K 4.1 05/09/2018   CL 104 05/09/2018   CREATININE 0.91 05/09/2018   BUN 12 05/09/2018   CO2 26 05/09/2018   TSH 2.72 05/09/2018   HGBA1C 5.7 (A) 08/12/2018   MICROALBUR 0.7 05/09/2018    Dg Hand Complete Right  Result Date: 02/14/2018 CLINICAL DATA:  Swelling.  Pain.  No known injury. EXAM: RIGHT HAND - COMPLETE 3+ VIEW COMPARISON:  None. FINDINGS: There is no  evidence of fracture or dislocation. There is no evidence of erosive arthropathy or other focal bone abnormality. Mild soft tissue swelling. Degenerative change PIP and DIP joints consistent with osteoarthritis. IMPRESSION: No fracture or erosive arthropathy. Electronically Signed   By: Elsie Stain M.D.   On: 02/14/2018 20:22   [place] Rt Upper Extrem Ltd Soft Tissue Non Vascular  Result Date: 02/14/2018 CLINICAL DATA:  Erythema and swelling along the palm of the right hand near the thumb. EXAM: ULTRASOUND RIGHT UPPER EXTREMITY LIMITED TECHNIQUE: Ultrasound examination of the upper extremity soft tissues was  performed in the area of clinical concern. COMPARISON:  Right hand radiographs from 02/14/2017 FINDINGS: Targeted study of the right palm near the thumb was performed over the area of swelling. Mild subcutaneous soft tissue edema is seen along the palmar aspect of the right hand in the area imaged. No drainable fluid collection is seen. IMPRESSION: Subcutaneous soft tissue edema, nonspecific but likely related to cellulitis. Electronically Signed   By: Tollie Eth M.D.   On: 02/14/2018 22:06    Assessment & Plan:   Rachael Silva was seen today for follow-up.  Diagnoses and all orders for this visit:  Steroid-induced diabetes mellitus (HCC) -     POCT glycosylated hemoglobin (Hb A1C)  Colon cancer screening -     Cologuard; Future  Essential hypertension -     furosemide (LASIX) 20 MG tablet; Take 0.5 tablets (10 mg total) by mouth daily. Need office visit for additional refills  Peripheral polyneuropathy -     gabapentin (NEURONTIN) 100 MG capsule; Take 1 capsule (100 mg total) by mouth 2 (two) times daily. -     ibuprofen (ADVIL,MOTRIN) 800 MG tablet; Take 1 tablet (800 mg total) by mouth every 8 (eight) hours as needed.  Gastroesophageal reflux disease, esophagitis presence not specified -     omeprazole (PRILOSEC) 20 MG capsule; Take 1 capsule (20 mg total) by mouth daily.   I have  discontinued Rachael Silva's levothyroxine, HYDROcodone-acetaminophen, docusate sodium, and amLODipine. I have also changed Rachael Silva omeprazole. Additionally, I am having Rachael Silva maintain Rachael Silva Fluticasone-Salmeterol, Multiple Vitamins-Minerals (CENTRUM ADULTS PO), Calcium Carbonate-Vitamin D (CALTRATE 600+D PO), co-enzyme Q-10, B Complex-C-Folic Acid (STRESS B COMPLEX PO), GARLIC PO, EPIPEN 2-PAK, ONETOUCH DELICA LANCETS 33G, ONE TOUCH ULTRA TEST, diphenhydrAMINE, Tiotropium Bromide Monohydrate, cetirizine, montelukast, albuterol, albuterol, triamcinolone ointment, furosemide, gabapentin, and ibuprofen. We will continue to administer omalizumab, omalizumab, and omalizumab.  Meds ordered this encounter  Medications  . omeprazole (PRILOSEC) 20 MG capsule    Sig: Take 1 capsule (20 mg total) by mouth daily.    Order Specific Question:   Supervising Provider    Answer:   Dianne Dun [[id number]]  . furosemide (LASIX) 20 MG tablet    Sig: Take 0.5 tablets (10 mg total) by mouth daily. Need office visit for additional refills    Dispense:  45 tablet    Refill:  2    Order Specific Question:   Supervising Provider    Answer:   Dianne Dun [[id number]]  . gabapentin (NEURONTIN) 100 MG capsule    Sig: Take 1 capsule (100 mg total) by mouth 2 (two) times daily.    Dispense:  60 capsule    Refill:  2    Order Specific Question:   Supervising Provider    Answer:   Dianne Dun [[id number]]  . ibuprofen (ADVIL,MOTRIN) 800 MG tablet    Sig: Take 1 tablet (800 mg total) by mouth every 8 (eight) hours as needed.    Dispense:  30 tablet    Refill:  0    Order Specific Question:   Supervising Provider    Answer:   Dianne Dun [[id number]]    Follow-up: Return in about 6 months (around 02/10/2019) for DM and HTN, hyperlipidemia (fasting).  Alysia Penna, NP

## 2018-08-12 NOTE — Telephone Encounter (Signed)
1 prefilled syringe Ordered date: 08/12/18 Shipping date: 08/12/18

## 2018-08-13 ENCOUNTER — Encounter: Payer: Self-pay | Admitting: Nurse Practitioner

## 2018-08-13 DIAGNOSIS — J301 Allergic rhinitis due to pollen: Secondary | ICD-10-CM | POA: Diagnosis not present

## 2018-08-13 NOTE — Telephone Encounter (Signed)
Arrival Date:08/13/18 Lot #: [id number] Exp date: 03/2020

## 2018-08-15 DIAGNOSIS — J301 Allergic rhinitis due to pollen: Secondary | ICD-10-CM | POA: Diagnosis not present

## 2018-08-19 ENCOUNTER — Ambulatory Visit: Payer: Medicare Other

## 2018-08-19 DIAGNOSIS — J301 Allergic rhinitis due to pollen: Secondary | ICD-10-CM | POA: Diagnosis not present

## 2018-08-21 ENCOUNTER — Ambulatory Visit: Payer: Self-pay | Admitting: *Deleted

## 2018-08-21 NOTE — Telephone Encounter (Signed)
Spoke with the pt. Rachael GowerCharlotte advise to take gabapentin 100 mg 2 tab at bedtime. Pt to call back with other side effects.

## 2018-08-21 NOTE — Telephone Encounter (Signed)
"  The patient is taking gabapentin (NEURONTIN) 100 MG capsule medication and the directions are to take them twice a day. Patient would like to know if she can take both capsules at night since the medication cautions driving while taking it."  Called patient back regarding the above message.  The gabapentin makes her sleepy and she wants to know if this med can be taken all at once ( 2 at bedtime). She would like a call back regarding this. Also she is inquiring about the colo guard. When will she be getting it?

## 2018-08-22 ENCOUNTER — Ambulatory Visit: Payer: Medicare Other

## 2018-08-22 DIAGNOSIS — J301 Allergic rhinitis due to pollen: Secondary | ICD-10-CM | POA: Diagnosis not present

## 2018-08-26 ENCOUNTER — Ambulatory Visit: Payer: Medicare Other

## 2018-08-28 ENCOUNTER — Ambulatory Visit (INDEPENDENT_AMBULATORY_CARE_PROVIDER_SITE_OTHER): Payer: Medicare Other

## 2018-08-28 DIAGNOSIS — J4550 Severe persistent asthma, uncomplicated: Secondary | ICD-10-CM | POA: Diagnosis not present

## 2018-08-28 MED ORDER — BENRALIZUMAB 30 MG/ML ~~LOC~~ SOSY
30.0000 mg | PREFILLED_SYRINGE | Freq: Once | SUBCUTANEOUS | Status: AC
  Administered 2018-08-28: 30 mg via SUBCUTANEOUS

## 2018-08-28 NOTE — Progress Notes (Signed)
Documentation of medication administration and charges of Fasenra have been completed by Lindsay Lemons, CMA based on the hand written Fasenra documentation sheet completed by Tammy Scott, who administered the medication.  

## 2018-08-29 ENCOUNTER — Ambulatory Visit: Payer: Medicare Other

## 2018-08-29 ENCOUNTER — Other Ambulatory Visit: Payer: Self-pay

## 2018-08-29 ENCOUNTER — Ambulatory Visit (INDEPENDENT_AMBULATORY_CARE_PROVIDER_SITE_OTHER): Payer: Medicare Other | Admitting: Neurology

## 2018-08-29 ENCOUNTER — Encounter: Payer: Self-pay | Admitting: Neurology

## 2018-08-29 VITALS — BP 140/82 | HR 67 | Ht 66.0 in | Wt 230.0 lb

## 2018-08-29 DIAGNOSIS — R202 Paresthesia of skin: Secondary | ICD-10-CM | POA: Diagnosis not present

## 2018-08-29 DIAGNOSIS — G629 Polyneuropathy, unspecified: Secondary | ICD-10-CM | POA: Diagnosis not present

## 2018-08-29 MED ORDER — DULOXETINE HCL 60 MG PO CPEP: 60.0000 mg | ORAL_CAPSULE | Freq: Every day | ORAL | 11 refills | Status: DC

## 2018-08-29 MED ORDER — GABAPENTIN 300 MG PO CAPS: 300.0000 mg | ORAL_CAPSULE | Freq: Three times a day (TID) | ORAL | 11 refills | Status: DC

## 2018-08-29 NOTE — Progress Notes (Signed)
PATIENT: Rachael Silva DOB: [birth year]  Chief Complaint  Patient presents with  . Polyneuropathy    New room, alone. She is having pain in her feet and right hand. Sx started this past May. Index finger and thumb on right hand numb. Unable to open things.   Marland Kitchen PCP    Nche, Bonna Gains, NP     HISTORICAL  Rachael Silva is a 72 years old female, seen in request by her primary care nurse practitioner Anne Ng, for evaluation of bilateral feet and right hand pain, initial evaluation was on August 29, 2018.  I have reviewed and summarized the referring note from the referring physician.  She had a past medical history of prednisone induced diabetes,asthma, chronic bronchitis, was previously treated with multiple rounds of prednisone for her respiratory issues, hypertension, obstructive sleep apnea, hypothyroidism,  I was able to review hospital discharge record on Feb 16, 2018, she was admitted to the hospital for acute onset of right hand pain, swelling, gradually getting worse, right hand pain was severe, involving the right second and third knuckle in the base of right thumb, right hand was warm to touch, she was diagnosed with acute right hand cellulitis, improved by IV antibiotic Zosyn and vancomycin, also has elevated troponin secondary to demand ischemia from ongoing infection, troponin level was 0.05,  Echocardiogram June 2019 ejection fraction 65 to 70%, left ventricular hypertrophy,  Ultrasound of right hand showed subcutaneous soft tissue edema, likely related to cellulitis, wbc was elevatec  Extensive laboratory evaluations reviewed, normal TSH, mild elevated LDL 123, cholesterol 208, Z6X 5.3, normal vitamin D,  On Feb 25, 2018, her WBC was 25, with normal neutrophil, most recent repeat CBC on July 01, 2018, WBC was 3.8, hemoglobin of 13.9,  With antibiotic treatment, her right hand swelling has much improved, but she had persistent numbness of right index  and since the incident, next  She also reported a left lateral ankle injury in April 2019, since then, she also had persistent bilateral lower extremity swelling, lateral foot hurt, involving the foot, plantar surface, difficulty bearing weight, numbness tingling   REVIEW OF SYSTEMS: Full 14 system review of systems performed and notable only for itching, cough, feeling hot, joint pain, swelling, aching muscles, allergy, numbness All other review of systems were negative.  ALLERGIES: Allergies  Allergen Reactions  . Lisinopril Diarrhea  . Atorvastatin     Muscle pain   . Erythromycin     REACTION: feels sick    HOME MEDICATIONS: Current Outpatient Medications  Medication Sig Dispense Refill  . albuterol (ACCUNEB) 0.63 MG/3ML nebulizer solution Take 3 mLs (0.63 mg total) by nebulization every 6 (six) hours as needed for wheezing or shortness of breath. 75 mL 0  . albuterol (VENTOLIN HFA) 108 (90 Base) MCG/ACT inhaler TAKE 1 TO 2 PUFFS EVERY 6 HOURS AS NEEDED 18 g 11  . B Complex-C-Folic Acid (STRESS B COMPLEX PO) Take 1 tablet by mouth daily.    . Calcium Carbonate-Vitamin D (CALTRATE 600+D PO) Take 1 tablet by mouth 2 (two) times daily.    . cetirizine (ZYRTEC ALLERGY) 10 MG tablet Take 1 tablet (10 mg total) by mouth daily. 30 tablet 6  . co-enzyme Q-10 30 MG capsule Take 200 mg by mouth daily.    Marland Kitchen EPIPEN 2-PAK 0.3 MG/0.3ML SOAJ injection See admin instructions.  99  . Fluticasone-Salmeterol (ADVAIR DISKUS) 250-50 MCG/DOSE AEPB Inhale 1 puff into the lungs 2 (two) times daily.     Marland Kitchen  furosemide (LASIX) 20 MG tablet Take 0.5 tablets (10 mg total) by mouth daily. Need office visit for additional refills 45 tablet 2  . gabapentin (NEURONTIN) 100 MG capsule Take 1 capsule (100 mg total) by mouth 2 (two) times daily. 60 capsule 2  . GARLIC PO Take 1 capsule by mouth daily.    Marland Kitchen ibuprofen (ADVIL,MOTRIN) 800 MG tablet Take 1 tablet (800 mg total) by mouth every 8 (eight) hours as needed. 30  tablet 0  . montelukast (SINGULAIR) 10 MG tablet TAKE 1 TABLET BY MOUTH EVERY DAY 90 tablet 1  . Multiple Vitamins-Minerals (CENTRUM ADULTS PO) Take 1 tablet by mouth daily.    Marland Kitchen omeprazole (PRILOSEC) 20 MG capsule Take 1 capsule (20 mg total) by mouth daily.    . ONE TOUCH ULTRA TEST test strip USE TO TEST TWICE DAILY DX E09.9 100 each 0  . ONETOUCH DELICA LANCETS 33G MISC USE TO TEST SUGARS TWICE DAILY DX E09.9 100 each 0  . Tiotropium Bromide Monohydrate (SPIRIVA RESPIMAT) 1.25 MCG/ACT AERS Inhale 2 puffs into the lungs 2 (two) times daily. 1 Inhaler 5  . triamcinolone ointment (KENALOG) 0.1 % APPLY TO AFFECTED AREA TWICE A DAY 80 g 0  . diphenhydrAMINE (BENADRYL ALLERGY) 25 MG tablet Take 1 tablet (25 mg total) by mouth every 8 (eight) hours for 1 day, THEN 1 tablet (25 mg total) every 12 (twelve) hours for 2 days, THEN 1 tablet (25 mg total) every 8 (eight) hours as needed for up to 2 days for itching. 30 tablet 0   Current Facility-Administered Medications  Medication Dose Route Frequency Provider Last Rate Last Dose  . omalizumab Geoffry Paradise) injection 375 mg  375 mg Subcutaneous Q14 Days Erin Fulling, MD   375 mg at 08/16/17 1249  . omalizumab Geoffry Paradise) injection 375 mg  375 mg Subcutaneous Q14 Days Erin Fulling, MD   375 mg at 09/25/17 1638  . omalizumab Geoffry Paradise) injection 375 mg  375 mg Subcutaneous Q14 Days Erin Fulling, MD   375 mg at 10/11/17 1625    PAST MEDICAL HISTORY: Past Medical History:  Diagnosis Date  . Allergy    allergic rhinitis  . Angioedema 02/2006  . Arthritis    OA of knees  . Asthma   . Complication of anesthesia    apnea- sleep  . Frequent headaches   . Hiatal hernia   . Hypertension   . Hypothyroid   . Labile blood pressure   . Lower extremity edema   . Lung disease   . Pneumonia   . Pneumonia, eosinophilic (HCC)   . Shortness of breath dyspnea   . Sleep apnea   . Urinary incontinence   . Wheezing     PAST SURGICAL HISTORY: Past Surgical History:   Procedure Laterality Date  . BREAST BIOPSY Left 2013   NEG  . BREAST SURGERY  1966   breast biopsy  . CESAREAN SECTION     x2  . FLEXIBLE BRONCHOSCOPY N/A 09/16/2015   Procedure: FLEXIBLE BRONCHOSCOPY WITH FLURO ;  Surgeon: Stephanie Acre, MD;  Location: [facility] ORS;  Service: Pulmonary;  Laterality: N/A;    FAMILY HISTORY: Family History  Problem Relation Age of Onset  . Diabetes Mother        pre-diabetic  . Heart disease Mother        ? heart disease  . Emphysema Mother   . Thyroid disease Mother   . Diabetes Maternal Uncle   . Heart disease Father   . Breast  cancer Neg Hx     SOCIAL HISTORY: Social History   Socioeconomic History  . Marital status: Single    Spouse name: Not on file  . Number of children: 2  . Years of education: college  . Highest education level: Not on file  Occupational History  . Occupation: retired    Associate Professormployer: RETIRED  Social Needs  . Financial resource strain: Not on file  . Food insecurity:    Worry: Not on file    Inability: Not on file  . Transportation needs:    Medical: Not on file    Non-medical: Not on file  Tobacco Use  . Smoking status: Never Smoker  . Smokeless tobacco: Never Used  Substance and Sexual Activity  . Alcohol use: No    Alcohol/week: 0.0 standard drinks  . Drug use: No  . Sexual activity: Never  Lifestyle  . Physical activity:    Days per week: Not on file    Minutes per session: Not on file  . Stress: Not on file  Relationships  . Social connections:    Talks on phone: Not on file    Gets together: Not on file    Attends religious service: Not on file    Active member of club or organization: Not on file    Attends meetings of clubs or organizations: Not on file    Relationship status: Not on file  . Intimate partner violence:    Fear of current or ex partner: Not on file    Emotionally abused: Not on file    Physically abused: Not on file    Forced sexual activity: Not on file  Other Topics  Concern  . Not on file  Social History Narrative   Lives alone   Left handed    Caffeine use: 1 cup per day     PHYSICAL EXAM   Vitals:   08/29/18 1351  BP: 140/82  Pulse: 67  Weight: 230 lb (104.3 kg)  Height: 5\' 6"  (1.676 m)    Not recorded      Body mass index is 37.12 kg/m.  PHYSICAL EXAMNIATION:  Gen: NAD, conversant, well nourised, obese, well groomed                     Cardiovascular: Regular rate rhythm, no peripheral edema, warm, nontender. Eyes: Conjunctivae clear without exudates or hemorrhage Neck: Supple, no carotid bruits. Pulmonary: Clear to auscultation bilaterally   NEUROLOGICAL EXAM:  MENTAL STATUS: Speech:    Speech is normal; fluent and spontaneous with normal comprehension.  Cognition:     Orientation to time, place and person     Normal recent and remote memory     Normal Attention span and concentration     Normal Language, naming, repeating,spontaneous speech     Fund of knowledge   CRANIAL NERVES: CN II: Visual fields are full to confrontation.  Pupils are round equal and briskly reactive to light. CN III, IV, VI: extraocular movement are normal. No ptosis. CN V: Facial sensation is intact to pinprick in all 3 divisions bilaterally. Corneal responses are intact.  CN VII: Face is symmetric with normal eye closure and smile. CN VIII: Hearing is normal to rubbing fingers CN IX, X: Palate elevates symmetrically. Phonation is normal. CN XI: Head turning and shoulder shrug are intact CN XII: Tongue is midline with normal movements and no atrophy.  MOTOR: There is no pronator drift of out-stretched arms. Muscle bulk and tone are  normal. Muscle strength is normal.  REFLEXES: Reflexes are 2+ and symmetric at the biceps, triceps, knees, and ankles. Plantar responses are flexor.  SENSORY: Mildly length dependent decreased to light touch, pinprick and vibratory sensation at toes.  COORDINATION: Rapid alternating movements and fine finger  movements are intact. There is no dysmetria on finger-to-nose and heel-knee-shin.    GAIT/STANCE: She needs to push up to get up from seated position, mildly antalgic, leaning forward  DIAGNOSTIC DATA (LABS, IMAGING, TESTING) - I reviewed patient records, labs, notes, testing and imaging myself where available.   ASSESSMENT AND PLAN  Lamae Fosco is a 72 y.o. female   Bilateral feet paresthesia and pain, right hand paresthesia  Differentiation diagnosis include peripheral neuropathy  EMG nerve conduction study  She tried gabapentin 100 mg twice a day with limited response, increased to 300 mg 3 times a day  Add on Cymbalta 60 mg daily   Levert Feinstein, M.D. Ph.D.  [facility] [address], [address] [place], [place] [address] Ph: [phone] Fax: [phone]  CC: Anne Ng, NP

## 2018-09-02 DIAGNOSIS — J301 Allergic rhinitis due to pollen: Secondary | ICD-10-CM | POA: Diagnosis not present

## 2018-09-09 ENCOUNTER — Telehealth: Payer: Self-pay | Admitting: Nurse Practitioner

## 2018-09-09 NOTE — Telephone Encounter (Signed)
Pt stated she is taking this Naproxen 500 mg for her arthritis in her foot and neurologist also recommended this dosage as well ( she forgot to ask them for Rx). Please advise.     Copied from CRM [id number]. Topic: Quick Communication - Rx Refill/Question >> Sep 09, 2018 11:35 AM Jilda Rocheemaray, Melissa wrote: Medication: Naproxen  Has the patient contacted their pharmacy? Yes.   (Agent: If no, request that the patient contact the pharmacy for the refill.) (Agent: If yes, when and what did the pharmacy advise?) Pharmacy advised it was denied for a refill, please advise.  Preferred Pharmacy (with phone number or street name): [facility] [phone][id number]- [place] - [address] [phone] (Phone) [phone] (Fax)    Call back is [phone]  Agent: Please be advised that RX refills may take up to 3 business days. We ask that you follow-up with your pharmacy.

## 2018-09-09 NOTE — Telephone Encounter (Signed)
Ibuprofen 800mg  was sent 08/12/2018. I do not see naproxen. When was this prescribed?

## 2018-09-09 NOTE — Telephone Encounter (Signed)
Pt is aware. Advised the pt to call back if she wants to be on ibuprofen or naproxen ( both Rx and high strenghten prescription).

## 2018-09-12 DIAGNOSIS — J301 Allergic rhinitis due to pollen: Secondary | ICD-10-CM | POA: Diagnosis not present

## 2018-09-16 ENCOUNTER — Telehealth: Payer: Self-pay | Admitting: Neurology

## 2018-09-16 DIAGNOSIS — J301 Allergic rhinitis due to pollen: Secondary | ICD-10-CM | POA: Diagnosis not present

## 2018-09-16 NOTE — Telephone Encounter (Signed)
Phone rang without being answered; eventually I received message stating "memory is full; enter remote access code."/fim

## 2018-09-16 NOTE — Telephone Encounter (Signed)
Pt called stating that medication   gabapentin (NEURONTIN) 300 MG capsule  Has caused drowsiness- also stating it hasn't helped much with the pain in her feet. Please advise.

## 2018-09-17 ENCOUNTER — Ambulatory Visit (INDEPENDENT_AMBULATORY_CARE_PROVIDER_SITE_OTHER): Payer: Medicare Other | Admitting: Internal Medicine

## 2018-09-17 ENCOUNTER — Encounter: Payer: Self-pay | Admitting: Internal Medicine

## 2018-09-17 VITALS — BP 170/80 | HR 93 | Ht 66.0 in | Wt 234.0 lb

## 2018-09-17 DIAGNOSIS — J4520 Mild intermittent asthma, uncomplicated: Secondary | ICD-10-CM

## 2018-09-17 MED ORDER — MONTELUKAST SODIUM 10 MG PO TABS: 10.0000 mg | ORAL_TABLET | Freq: Every day | ORAL | 2 refills | Status: DC

## 2018-09-17 MED ORDER — FLUTICASONE FUROATE-VILANTEROL 200-25 MCG/INH IN AEPB: 1.0000 | INHALATION_SPRAY | Freq: Once | RESPIRATORY_TRACT | 10 refills | Status: AC

## 2018-09-17 MED ORDER — FLUTICASONE FUROATE-VILANTEROL 200-25 MCG/INH IN AEPB: 1.0000 | INHALATION_SPRAY | Freq: Every day | RESPIRATORY_TRACT | 0 refills | Status: DC

## 2018-09-17 MED ORDER — FLUTICASONE-SALMETEROL 250-50 MCG/DOSE IN AEPB: 1.0000 | INHALATION_SPRAY | Freq: Two times a day (BID) | RESPIRATORY_TRACT | 5 refills | Status: DC

## 2018-09-17 NOTE — Telephone Encounter (Signed)
Spoke to patient who is agreeable to try a lower dose during the day and slowly titrate up the gabapentin.  She has enough of a supply of gabapentin 100mg  at home to do this.  She has not started the duloxetine yet.  She filled the prescription then lost it.  States her insurance plan will allow coverage on 09/22/18 and she plans to start it at that time.

## 2018-09-17 NOTE — Telephone Encounter (Signed)
If gabapentin 300 mg 3 times a day cause drowsiness, she may back off gabapentin to 100 mg 1 to 2 tablets 3 times a day, we can send  prescription of gabapentin 100 mg capsule if needed

## 2018-09-17 NOTE — Patient Instructions (Addendum)
SPIRIVA RESPIMAT 2 puffs once daily  BREO 1 puff once a day  STOP ADVAIR   Continue CPAP as prescribed

## 2018-09-17 NOTE — Progress Notes (Signed)
MRN# [id number] Rachael Silva 03-14-1946  synopsis: 72 year old female past medical history of asthma. Being followed by pulmonary for chronic eosinophilic pneumonia, status post bronchoscopy in December 2016 with negative cultures for AFB and fungus. Transbronchial biopsy-positive for eosinophilic infiltrate. Treated with steroids unable to wean since  October 2015. CC: Chief Complaint  Patient presents with  . Asthma    better with injections      CC follow up ASTHMA HPI  Patient presents today for follow-up visit for ABPA asthma Her asthma seems to be well-controlled at this time currently on Advair 2 puffs twice daily   Continues to take the center injections Has intermittent wheezing Still surrounded by cats   No acute signs of affection  no wheezing or shortness of breath  no dyspnea on exertion    Compliance report shows 90% for 30 days 87 compliance for greater than 4 hours AHI is down to 0.8   She is constantly being exposed to allergens and cats at home    Review of Systems:  Gen:  Denies  fever, sweats, chills weigh loss  HEENT: Denies blurred vision, double vision, ear pain, eye pain, hearing loss, nose bleeds, sore throat Cardiac:  No dizziness, chest pain or heaviness, chest tightness,edema, No JVD Resp:   No cough, -sputum production, -shortness of breath,-wheezing, -hemoptysis,  Gi: Denies swallowing difficulty, stomach pain, nausea or vomiting, diarrhea, constipation, bowel incontinence Gu:  Denies bladder incontinence, burning urine Ext:   Denies Joint pain, stiffness or swelling Skin: Denies  skin rash, easy bruising or bleeding or hives Endoc:  Denies polyuria, polydipsia , polyphagia or weight change Psych:   Denies depression, insomnia or hallucinations  Other:  All other systems negative   Allergies:  Lisinopril; Atorvastatin; and Erythromycin  Physical Examination:  VS: BP (!) 170/80 (BP Location: Left Arm, Cuff Size: Normal) Comment:  Simultaneous filing. User may not have seen previous data. Comment (BP Location): Simultaneous filing. User may not have seen previous data. Comment (Cuff Size): Simultaneous filing. User may not have seen previous data.  Pulse 93   Ht 5\' 6"  (1.676 m)   Wt 234 lb (106.1 kg)   SpO2 97%   BMI 37.77 kg/m   BP (!) 170/80 (BP Location: Left Arm, Cuff Size: Normal) Comment: Simultaneous filing. User may not have seen previous data. Comment (BP Location): Simultaneous filing. User may not have seen previous data. Comment (Cuff Size): Simultaneous filing. User may not have seen previous data.  Pulse 93   Ht 5\' 6"  (1.676 m)   Wt 234 lb (106.1 kg)   SpO2 97%   BMI 37.77 kg/m  BP (!) 170/80 (BP Location: Left Arm, Cuff Size: Normal) Comment: Simultaneous filing. User may not have seen previous data. Comment (BP Location): Simultaneous filing. User may not have seen previous data. Comment (Cuff Size): Simultaneous filing. User may not have seen previous data.  Pulse 93   Ht 5\' 6"  (1.676 m)   Wt 234 lb (106.1 kg)   SpO2 97%   BMI 37.77 kg/m   Physical Examination:   GENERAL:NAD, no fevers, chills, no weakness no fatigue HEAD: Normocephalic, atraumatic.  EYES: Pupils equal, round, reactive to light. Extraocular muscles intact. No scleral icterus.  MOUTH: Moist mucosal membrane. Dentition intact. No abscess noted.  EAR, NOSE, THROAT: Clear without exudates. No external lesions.  NECK: Supple. No thyromegaly. No nodules. No JVD.  PULMONARY: CTA B/L no wheezing, rhonchi, crackles CARDIOVASCULAR: S1 and S2. Regular rate and rhythm. No murmurs, rubs, or  gallops. No edema. Pedal pulses 2+ bilaterally.  GASTROINTESTINAL: Soft, nontender, nondistended. No masses. Positive bowel sounds. No hepatosplenomegaly.  MUSCULOSKELETAL: No swelling, clubbing, or edema. Range of motion full in all extremities.  NEUROLOGIC: Cranial nerves II through XII are intact. No gross focal neurological deficits. Sensation  intact. Reflexes intact.  SKIN: No ulceration, lesions, rashes, or cyanosis. Skin warm and dry. Turgor intact.  PSYCHIATRIC: Mood, affect within normal limits. The patient is awake, alert and oriented x 3. Insight, judgment intact.  ALL OTHER ROS ARE NEGATIVE      LABS Results for BRITTON, PERKINSON (MRN [id number]) as of 08/01/2016 11:16  Ref. Range 07/02/2014 13:00 08/05/2015 15:12 03/30/2016 15:11  IgE (Immunoglobulin E), Serum Latest Ref Range: 0 - 100 IU/mL 2,401 (H) 1,253 (H) 1,180 (H)   Results for NYCOLE, KAWAHARA (MRN [id number]) as of 08/01/2016 11:16  Ref. Range 08/05/2015 15:12 09/16/2015 12:46 03/30/2016 15:11 05/03/2016 08:42 07/26/2016 15:31  Eosinophil Latest Units: % 10 11 5  5.4 (H) 3        Assessment and Plan:  72 year old pleasant African-American female seen today for recurrent URIs with a history of severe persistent asthma which seems to be controlled with immunological injections as well as inhaler therapy Patient has an underlying diagnosis of obstructive sleep apnea on auto CPAP therapy  Her current living conditions include carpet and cats which she is allergic to I have advised to limit her exposure to avoid asthma exacerbations   Asthma history of chronic eosinophilic pneumonia Severe persistent chronic asthma is controlled with Harrington Challenger and inhaler therapy Patient would like to see if Virgel Bouquet helps Stop Advair  continue Spiriva Respimat Albuterol as needed No indication for prednisone at this time Avoid allergens   Allergic rhinitis continue antihistamine, Singulair and Flonase   History of ABPA No indication for prednisone at this time Bronchoscopy with BAL in 2016 was consistent with eosinophilic pneumonia   OSA on CPAP Continue auto CPAP nightly Compliance report shows improved compliance over the last 30 days Auto CPAP 11 to 15 cm of water pressure AHI down to 0.8    Patient  satisfied with Plan of action and management. All questions  answered Follow up in 6 months   Verlin Uher [name], M.D.  [facility]  Medical Director [facility] Medical Director [facility] Cardio-Pulmonary Department

## 2018-09-17 NOTE — Telephone Encounter (Signed)
Revised. 

## 2018-09-17 NOTE — Telephone Encounter (Addendum)
Rn call patient about the Neurontin causing drowsiness.Pt stated when she was taking it three times a day it made her drowsiness where she could not drive. Rn stated a message will be sent to Dr.Yan for review.

## 2018-09-19 DIAGNOSIS — J301 Allergic rhinitis due to pollen: Secondary | ICD-10-CM | POA: Diagnosis not present

## 2018-09-20 ENCOUNTER — Other Ambulatory Visit: Payer: Self-pay | Admitting: Neurology

## 2018-09-20 DIAGNOSIS — G629 Polyneuropathy, unspecified: Secondary | ICD-10-CM

## 2018-09-23 DIAGNOSIS — J301 Allergic rhinitis due to pollen: Secondary | ICD-10-CM | POA: Diagnosis not present

## 2018-09-30 DIAGNOSIS — J301 Allergic rhinitis due to pollen: Secondary | ICD-10-CM | POA: Diagnosis not present

## 2018-10-10 DIAGNOSIS — J301 Allergic rhinitis due to pollen: Secondary | ICD-10-CM | POA: Diagnosis not present

## 2018-10-18 ENCOUNTER — Ambulatory Visit (INDEPENDENT_AMBULATORY_CARE_PROVIDER_SITE_OTHER): Payer: Medicare Other | Admitting: Neurology

## 2018-10-18 ENCOUNTER — Encounter (INDEPENDENT_AMBULATORY_CARE_PROVIDER_SITE_OTHER): Payer: Medicare Other | Admitting: Neurology

## 2018-10-18 DIAGNOSIS — G629 Polyneuropathy, unspecified: Secondary | ICD-10-CM

## 2018-10-18 DIAGNOSIS — R202 Paresthesia of skin: Secondary | ICD-10-CM | POA: Insufficient documentation

## 2018-10-18 DIAGNOSIS — Z0289 Encounter for other administrative examinations: Secondary | ICD-10-CM

## 2018-10-18 NOTE — Procedures (Signed)
Full Name: Rachael Silva Gender: Female MRN #: [id number] Date of Birth: 1946-01-23    Visit Date: 10/18/2018 11:28 Age: 73 Years 8 Months Old Examining Physician: Levert Feinstein, MD  Referring Physician: Levert Feinstein, MD History: 73 years old female with long history of bilateral feet paresthesia getting worse after she injured her left ankle few months ago history of diabetes, mild gait abnormality, also complains of right hand paresthesia mainly involving right first and second fingers.  Summary of the tests:  Nerve conduction study: Bilateral sural sensory responses were absent.  Left superficial peroneal sensory response was absent, right superficial peroneal sensory response showed normal peak latency with mildly decreased to snap amplitude.  Right median sensory sensory response showed mildly prolonged peak latency, with mildly decreased snap amplitude, right ulnar sensory responses were normal.  Bilateral tibial motor responses showed mildly decreased the C map amplitude, with suboptimal stimulation right proximal side.  Left peroneal to EDB motor response was absent.  Right peroneal to EDB motor response showed significantly decreased the C map amplitude, normal distal latency.  Right median and ulnar motor responses were normal.  Electromyography: Selected needle examinations were performed at right upper and lower extremity muscles, right cervical and lumbosacral paraspinal muscles.  There is evidence of increased insertional activity, mildly enlarged motor unit potential with decreased recruitment at right L4, 5, S1 myotomes.  Conclusion: This is an abnormal study.  There is electrodiagnostic evidence of mild axonal sensorimotor polyneuropathy.  In addition, there is also evidence of mild right lumbosacral radiculopathy.    ------------------------------- Levert Feinstein M.D. PhD  [facility] [address] [place], [place] [address] Tel:  [phone] Fax: [phone]        [facility]    Nerve / Sites Muscle Latency Ref. Amplitude Ref. Rel Amp Segments Distance Velocity Ref. Area    ms ms mV mV %  cm m/s m/s mVms  R Median - APB     Wrist APB 3.8 ?4.4 3.7 ?4.0 100 Wrist - APB 7   18.2     Upper arm APB 8.3  3.5  94.9 Upper arm - Wrist 24 54 ?49 17.1  R Ulnar - ADM     Wrist ADM 2.6 ?3.3 6.1 ?6.0 100 Wrist - ADM 7   25.4     B.Elbow ADM 6.0  4.6  75.9 B.Elbow - Wrist 21 61 ?49 23.1     A.Elbow ADM 8.4  4.5  98.3 A.Elbow - B.Elbow 10 42 ?49 22.7         A.Elbow - Wrist      R Peroneal - EDB     Ankle EDB 5.5 ?6.5 0.8 ?2.0 100 Ankle - EDB 9   2.4     Fib head EDB 13.2  0.6  76.6 Fib head - Ankle 32 42 ?44 1.4     Pop fossa EDB 15.6  0.6  90.3 Pop fossa - Fib head 10 42 ?44 1.5         Pop fossa - Ankle      L Peroneal - EDB     Ankle EDB NR ?6.5 NR ?2.0 NR Ankle - EDB 9   NR     Fib head EDB NR  NR  NR Fib head - Ankle 32 NR ?44 NR  R Tibial - AH     Ankle AH 4.4 ?5.8 2.6 ?4.0 100 Ankle - AH 9   6.8     Pop fossa  AH NR  NR  NR Pop fossa - Ankle 36 NR ?41 NR  L Tibial - AH     Ankle AH 5.8 ?5.8 3.5 ?4.0 100 Ankle - AH 9   8.2     Pop fossa AH 14.8  3.7  107 Pop fossa - Ankle 36 40 ?41 9.2                 SNC    Nerve / Sites Rec. Site Peak Lat Ref.  Amp Ref. Segments Distance    ms ms V V  cm  R Sural - Ankle (Calf)     Calf Ankle NR ?4.4 NR ?6 Calf - Ankle 14  L Sural - Ankle (Calf)     Calf Ankle NR ?4.4 NR ?6 Calf - Ankle 14  R Superficial peroneal - Ankle     Lat leg Ankle 3.8 ?4.4 4 ?6 Lat leg - Ankle 14  L Superficial peroneal - Ankle     Lat leg Ankle NR ?4.4 NR ?6 Lat leg - Ankle 14  R Median - Orthodromic (Dig II, Mid palm)     Dig II Wrist 3.5 ?3.4 7 ?10 Dig II - Wrist 13  R Ulnar - Orthodromic, (Dig V, Mid palm)     Dig V Wrist 2.8 ?3.1 8 ?5 Dig V - Wrist 1711                 F  Wave    Nerve F Lat Ref.   ms ms  R Tibial - AH 60.2 ?56.0  L Tibial - AH 60.2 ?56.0  R Ulnar - ADM 30.9 ?32.0            EMG full       EMG Summary Table    Spontaneous MUAP Recruitment  Muscle IA Fib PSW Fasc Other Amp Dur. Poly Pattern  R. Tibialis anterior  increased  None None None _______ Normal Normal Normal  decreased  R. Gastrocnemius (Medial head)  increased None None None _______ Normal Normal Normal  decreased  R. Tibialis posterior  increased None None None _______ Normal Normal Normal  decreased  R. Vastus lateralis  increased None None None _______  enlarged Normal Normal  decreased  R. Abductor hallucis Normal None None None _______ Normal Normal Normal Normal  R. Lumbar paraspinals (mid) Normal None None None _______ Normal Normal Normal Normal  R. Lumbar paraspinals (low) Normal None None None _______ Normal Normal Normal Normal  R. First dorsal interosseous Normal None None None _______ Normal Normal Normal Normal  R. Biceps brachii Normal None None None _______ Normal Normal Normal Normal  R. Deltoid Normal None None None _______ Normal Normal Normal Normal  R. Triceps brachii Normal None None None _______ Normal Normal Normal Normal  R. Pronator teres Normal None None None _______ Normal Normal Normal Normal  R. Cervical paraspinals Normal None None None _______ Normal Normal Normal Normal

## 2018-10-18 NOTE — Progress Notes (Signed)
PATIENT: Rachael Silva DOB: [birth year]  No chief complaint on file.    HISTORICAL  Rachael Silva is a 73 years old female, seen in request by her primary care nurse practitioner Anne Ng, for evaluation of bilateral feet and right hand pain, initial evaluation was on August 29, 2018.  I have reviewed and summarized the referring note from the referring physician.  She had a past medical history of prednisone induced diabetes,asthma, chronic bronchitis, was previously treated with multiple rounds of prednisone for her respiratory issues, hypertension, obstructive sleep apnea, hypothyroidism,  I was able to review hospital discharge record on Feb 16, 2018, she was admitted to the hospital for acute onset of right hand pain, swelling, gradually getting worse, right hand pain was severe, involving the right second and third knuckle in the base of right thumb, right hand was warm to touch, she was diagnosed with acute right hand cellulitis, improved by IV antibiotic Zosyn and vancomycin, also has elevated troponin secondary to demand ischemia from ongoing infection, troponin level was 0.05,  Echocardiogram June 2019 ejection fraction 65 to 70%, left ventricular hypertrophy,  Ultrasound of right hand showed subcutaneous soft tissue edema, likely related to cellulitis, wbc was elevatec  Extensive laboratory evaluations reviewed, normal TSH, mild elevated LDL 123, cholesterol 208, Z6X 5.3, normal vitamin D,  On Feb 25, 2018, her WBC was 25, with normal neutrophil, most recent repeat CBC on July 01, 2018, WBC was 3.8, hemoglobin of 13.9,  With antibiotic treatment, her right hand swelling has much improved, but she had persistent numbness of right index and since the incident, next  She also reported a left lateral ankle injury in April 2019, since then, she also had persistent bilateral lower extremity swelling, lateral foot hurt, involving the foot, plantar surface,  difficulty bearing weight, numbness tingling  UPDATE Oct 18 2018:  EMG nerve conduction study today showed mild peripheral neuropathy, there is also evidence of chronic neuropathic changes at the right L4-5 S1 myotomes, wrist raise the possibility of lumbar radiculopathy, she has mild low back pain, unsteady gait, will proceed with MRI of lumbar spine, laboratory evaluation for etiology of peripheral neuropathy, she did have a history of diabetes in the past, most recent A1c November 2019 was 5.7, improved after stopping steroid treatment.  She has tried Cymbalta and gabapentin combination, which does help her bilateral lower extremity paresthesia but complains of excessive drowsiness,     REVIEW OF SYSTEMS: Full 14 system review of systems performed and notable only for as above All other review of systems were negative.  ALLERGIES: Allergies  Allergen Reactions  . Lisinopril Diarrhea  . Atorvastatin     Muscle pain   . Erythromycin     REACTION: feels sick    HOME MEDICATIONS: Current Outpatient Medications  Medication Sig Dispense Refill  . albuterol (ACCUNEB) 0.63 MG/3ML nebulizer solution Take 3 mLs (0.63 mg total) by nebulization every 6 (six) hours as needed for wheezing or shortness of breath. 75 mL 0  . albuterol (VENTOLIN HFA) 108 (90 Base) MCG/ACT inhaler TAKE 1 TO 2 PUFFS EVERY 6 HOURS AS NEEDED 18 g 11  . B Complex-C-Folic Acid (STRESS B COMPLEX PO) Take 1 tablet by mouth daily.    . Calcium Carbonate-Vitamin D (CALTRATE 600+D PO) Take 1 tablet by mouth 2 (two) times daily.    . cetirizine (ZYRTEC ALLERGY) 10 MG tablet Take 1 tablet (10 mg total) by mouth daily. 30 tablet 6  . co-enzyme Q-10 30  MG capsule Take 200 mg by mouth daily.    . diphenhydrAMINE (BENADRYL ALLERGY) 25 MG tablet Take 1 tablet (25 mg total) by mouth every 8 (eight) hours for 1 day, THEN 1 tablet (25 mg total) every 12 (twelve) hours for 2 days, THEN 1 tablet (25 mg total) every 8 (eight) hours as  needed for up to 2 days for itching. 30 tablet 0  . DULoxetine (CYMBALTA) 60 MG capsule TAKE 1 CAPSULE BY MOUTH EVERY DAY 90 capsule 4  . EPIPEN 2-PAK 0.3 MG/0.3ML SOAJ injection See admin instructions.  99  . fluticasone furoate-vilanterol (BREO ELLIPTA) 200-25 MCG/INH AEPB Inhale 1 puff into the lungs daily. 1 each 0  . Fluticasone-Salmeterol (WIXELA INHUB) 250-50 MCG/DOSE AEPB Inhale 1 puff into the lungs 2 (two) times daily. 60 each 5  . furosemide (LASIX) 20 MG tablet Take 0.5 tablets (10 mg total) by mouth daily. Need office visit for additional refills 45 tablet 2  . gabapentin (NEURONTIN) 300 MG capsule TAKE 1 CAPSULE BY MOUTH THREE TIMES A DAY 270 capsule 4  . GARLIC PO Take 1 capsule by mouth daily.    Marland Kitchen ibuprofen (ADVIL,MOTRIN) 800 MG tablet Take 1 tablet (800 mg total) by mouth every 8 (eight) hours as needed. 30 tablet 0  . montelukast (SINGULAIR) 10 MG tablet Take 1 tablet (10 mg total) by mouth daily. 90 tablet 2  . Multiple Vitamins-Minerals (CENTRUM ADULTS PO) Take 1 tablet by mouth daily.    Marland Kitchen omeprazole (PRILOSEC) 20 MG capsule Take 1 capsule (20 mg total) by mouth daily.    . ONE TOUCH ULTRA TEST test strip USE TO TEST TWICE DAILY DX E09.9 100 each 0  . ONETOUCH DELICA LANCETS 33G MISC USE TO TEST SUGARS TWICE DAILY DX E09.9 100 each 0  . Tiotropium Bromide Monohydrate (SPIRIVA RESPIMAT) 1.25 MCG/ACT AERS Inhale 2 puffs into the lungs 2 (two) times daily. 1 Inhaler 5  . triamcinolone ointment (KENALOG) 0.1 % APPLY TO AFFECTED AREA TWICE A DAY 80 g 0   Current Facility-Administered Medications  Medication Dose Route Frequency Provider Last Rate Last Dose  . omalizumab Geoffry Paradise) injection 375 mg  375 mg Subcutaneous Q14 Days Erin Fulling, MD   375 mg at 08/16/17 1249  . omalizumab Geoffry Paradise) injection 375 mg  375 mg Subcutaneous Q14 Days Erin Fulling, MD   375 mg at 09/25/17 1638  . omalizumab Geoffry Paradise) injection 375 mg  375 mg Subcutaneous Q14 Days Erin Fulling, MD   375 mg at  10/11/17 1625    PAST MEDICAL HISTORY: Past Medical History:  Diagnosis Date  . Allergy    allergic rhinitis  . Angioedema 02/2006  . Arthritis    OA of knees  . Asthma   . Complication of anesthesia    apnea- sleep  . Frequent headaches   . Hiatal hernia   . Hypertension   . Hypothyroid   . Labile blood pressure   . Lower extremity edema   . Lung disease   . Pneumonia   . Pneumonia, eosinophilic (HCC)   . Shortness of breath dyspnea   . Sleep apnea   . Urinary incontinence   . Wheezing     PAST SURGICAL HISTORY: Past Surgical History:  Procedure Laterality Date  . BREAST BIOPSY Left 2013   NEG  . BREAST SURGERY  1966   breast biopsy  . CESAREAN SECTION     x2  . FLEXIBLE BRONCHOSCOPY N/A 09/16/2015   Procedure: FLEXIBLE BRONCHOSCOPY WITH FLURO ;  Surgeon: Stephanie AcreVishal Mungal, MD;  Location: [facility] ORS;  Service: Pulmonary;  Laterality: N/A;    FAMILY HISTORY: Family History  Problem Relation Age of Onset  . Diabetes Mother        pre-diabetic  . Heart disease Mother        ? heart disease  . Emphysema Mother   . Thyroid disease Mother   . Diabetes Maternal Uncle   . Heart disease Father   . Breast cancer Neg Hx     SOCIAL HISTORY: Social History   Socioeconomic History  . Marital status: Single    Spouse name: Not on file  . Number of children: 2  . Years of education: college  . Highest education level: Not on file  Occupational History  . Occupation: retired    Associate Professormployer: RETIRED  Social Needs  . Financial resource strain: Not on file  . Food insecurity:    Worry: Not on file    Inability: Not on file  . Transportation needs:    Medical: Not on file    Non-medical: Not on file  Tobacco Use  . Smoking status: Never Smoker  . Smokeless tobacco: Never Used  Substance and Sexual Activity  . Alcohol use: No    Alcohol/week: 0.0 standard drinks  . Drug use: No  . Sexual activity: Never  Lifestyle  . Physical activity:    Days per week: Not on  file    Minutes per session: Not on file  . Stress: Not on file  Relationships  . Social connections:    Talks on phone: Not on file    Gets together: Not on file    Attends religious service: Not on file    Active member of club or organization: Not on file    Attends meetings of clubs or organizations: Not on file    Relationship status: Not on file  . Intimate partner violence:    Fear of current or ex partner: Not on file    Emotionally abused: Not on file    Physically abused: Not on file    Forced sexual activity: Not on file  Other Topics Concern  . Not on file  Social History Narrative   Lives alone   Left handed    Caffeine use: 1 cup per day     PHYSICAL EXAM   There were no vitals filed for this visit.  Not recorded      There is no height or weight on file to calculate BMI.  PHYSICAL EXAMNIATION:  Gen: NAD, conversant, well nourised, obese, well groomed                     Cardiovascular: Regular rate rhythm, no peripheral edema, warm, nontender. Eyes: Conjunctivae clear without exudates or hemorrhage Neck: Supple, no carotid bruits. Pulmonary: Clear to auscultation bilaterally   NEUROLOGICAL EXAM:  MENTAL STATUS: Speech:    Speech is normal; fluent and spontaneous with normal comprehension.  Cognition:     Orientation to time, place and person     Normal recent and remote memory     Normal Attention span and concentration     Normal Language, naming, repeating,spontaneous speech     Fund of knowledge   CRANIAL NERVES: CN II: Visual fields are full to confrontation.  Pupils are round equal and briskly reactive to light. CN III, IV, VI: extraocular movement are normal. No ptosis. CN V: Facial sensation is intact to pinprick in all 3 divisions bilaterally.  Corneal responses are intact.  CN VII: Face is symmetric with normal eye closure and smile. CN VIII: Hearing is normal to rubbing fingers CN IX, X: Palate elevates symmetrically. Phonation is  normal. CN XI: Head turning and shoulder shrug are intact CN XII: Tongue is midline with normal movements and no atrophy.  MOTOR: Mild left ankle swelling, no significant weakness noted,  REFLEXES: Reflexes are 2+ and symmetric at the biceps, triceps, knees, and ankles. Plantar responses are flexor.  SENSORY: Mildly length dependent decreased to light touch, pinprick and vibratory sensation at toes.  COORDINATION: Rapid alternating movements and fine finger movements are intact. There is no dysmetria on finger-to-nose and heel-knee-shin.    GAIT/STANCE: She needs to push up to get up from seated position, mildly antalgic, leaning forward  DIAGNOSTIC DATA (LABS, IMAGING, TESTING) - I reviewed patient records, labs, notes, testing and imaging myself where available.   ASSESSMENT AND PLAN  Lavonia DanaDejuanna Ledwith is a 73 y.o. female   Bilateral feet paresthesia and pain, right hand paresthesia     EMG nerve conduction study today confirmed mild axonal peripheral neuropathy, also raised the concern of right lumbosacral radiculopathy,  Laboratory evaluation for treatable causes of peripheral neuropathy she does have a history of diabetes in the past, most recent A1c was 5.7, improved after stopping steroid treatment  MRI of lumbar spine  Complains of excessive drowsiness with combination of gabapentin and Cymbalta, will try Cymbalta 60 mg daily   Levert FeinsteinYijun Arnola Crittendon, M.D. Ph.D.  [facility] [address], [address] [place], [place] [address] Ph: [phone] Fax: [phone]  CC: Anne NgNche, Charlotte Lum, NP

## 2018-10-19 ENCOUNTER — Other Ambulatory Visit: Payer: Self-pay | Admitting: Internal Medicine

## 2018-10-21 ENCOUNTER — Telehealth: Payer: Self-pay | Admitting: Neurology

## 2018-10-21 NOTE — Telephone Encounter (Signed)
lvm for pt to be aware. I gave her GI phone number of [phone] and to give them a call if she has not heard in the next 2-3 business days.  °

## 2018-10-21 NOTE — Telephone Encounter (Signed)
Medicare/BCBS supp order sent to GI. No auth they will reach out to the pt to schedule.  °

## 2018-10-22 LAB — C-REACTIVE PROTEIN: CRP: <1 mg/L (ref 0–10)

## 2018-10-22 LAB — PROTEIN ELECTROPHORESIS
A/G Ratio: 1 (ref 0.7–1.7)
Albumin ELP: 3.8 g/dL (ref 2.9–4.4)
Alpha 1: 0.2 g/dL (ref 0.0–0.4)
Alpha 2: 0.7 g/dL (ref 0.4–1.0)
Beta: 1.2 g/dL (ref 0.7–1.3)
Gamma Globulin: 1.7 g/dL (ref 0.4–1.8)
Globulin, Total: 3.7 g/dL (ref 2.2–3.9)
Total Protein: 7.5 g/dL (ref 6.0–8.5)

## 2018-10-22 LAB — ANA W/REFLEX IF POSITIVE: Anti Nuclear Antibody(ANA): NEGATIVE

## 2018-10-22 LAB — SEDIMENTATION RATE: Sed Rate: 16 mm/hr (ref 0–40)

## 2018-10-22 LAB — CK: Total CK: 58 U/L (ref 24–173)

## 2018-10-22 LAB — RPR: RPR Ser Ql: NONREACTIVE

## 2018-10-22 LAB — VITAMIN B12: VITAMIN B 12: 530 pg/mL (ref 232–1245)

## 2018-10-23 ENCOUNTER — Ambulatory Visit (INDEPENDENT_AMBULATORY_CARE_PROVIDER_SITE_OTHER): Payer: Medicare Other

## 2018-10-23 DIAGNOSIS — J4520 Mild intermittent asthma, uncomplicated: Secondary | ICD-10-CM

## 2018-10-28 MED ORDER — BENRALIZUMAB 30 MG/ML ~~LOC~~ SOSY
30.0000 mg | PREFILLED_SYRINGE | Freq: Once | SUBCUTANEOUS | Status: AC
  Administered 2018-10-23: 30 mg via SUBCUTANEOUS

## 2018-10-31 ENCOUNTER — Other Ambulatory Visit: Payer: Self-pay | Admitting: *Deleted

## 2018-10-31 ENCOUNTER — Other Ambulatory Visit: Payer: Medicare Other

## 2018-10-31 ENCOUNTER — Telehealth: Payer: Self-pay

## 2018-10-31 DIAGNOSIS — G629 Polyneuropathy, unspecified: Secondary | ICD-10-CM

## 2018-10-31 DIAGNOSIS — J301 Allergic rhinitis due to pollen: Secondary | ICD-10-CM | POA: Diagnosis not present

## 2018-10-31 DIAGNOSIS — R200 Anesthesia of skin: Secondary | ICD-10-CM

## 2018-10-31 NOTE — Telephone Encounter (Signed)
Re-order for MRI lumbar w/o placed in Epic with [place] chosen as site.

## 2018-10-31 NOTE — Telephone Encounter (Signed)
Patient is scheduled at [facility] for 11/07/18 arrival time is 1:30 pm patient is aware of time & day. I did give her their number of [phone] incase she needs to r/s for any reason .

## 2018-10-31 NOTE — Telephone Encounter (Signed)
Patient mentioned that she missed her MRI appointment due to her being 5 mins late. She would like her MRI done in [place] instead. Please advise

## 2018-10-31 NOTE — Telephone Encounter (Signed)
Noted thank you

## 2018-11-04 ENCOUNTER — Other Ambulatory Visit: Payer: Self-pay | Admitting: Nurse Practitioner

## 2018-11-04 DIAGNOSIS — Z1231 Encounter for screening mammogram for malignant neoplasm of breast: Secondary | ICD-10-CM

## 2018-11-06 DIAGNOSIS — J301 Allergic rhinitis due to pollen: Secondary | ICD-10-CM | POA: Diagnosis not present

## 2018-11-07 ENCOUNTER — Ambulatory Visit
Admission: RE | Admit: 2018-11-07 | Discharge: 2018-11-07 | Disposition: A | Discharge status: Home / Self Care | Payer: Medicare Other | Source: Ambulatory Visit | Attending: Neurology | Admitting: Neurology

## 2018-11-07 DIAGNOSIS — G629 Polyneuropathy, unspecified: Secondary | ICD-10-CM | POA: Diagnosis not present

## 2018-11-07 DIAGNOSIS — M47817 Spondylosis without myelopathy or radiculopathy, lumbosacral region: Secondary | ICD-10-CM | POA: Diagnosis not present

## 2018-11-07 DIAGNOSIS — R200 Anesthesia of skin: Secondary | ICD-10-CM | POA: Diagnosis not present

## 2018-11-07 DIAGNOSIS — M5126 Other intervertebral disc displacement, lumbar region: Secondary | ICD-10-CM | POA: Diagnosis not present

## 2018-11-07 DIAGNOSIS — M5127 Other intervertebral disc displacement, lumbosacral region: Secondary | ICD-10-CM | POA: Diagnosis not present

## 2018-11-07 DIAGNOSIS — M48061 Spinal stenosis, lumbar region without neurogenic claudication: Secondary | ICD-10-CM | POA: Diagnosis not present

## 2018-11-08 ENCOUNTER — Telehealth: Payer: Self-pay | Admitting: *Deleted

## 2018-11-08 ENCOUNTER — Telehealth: Payer: Self-pay | Admitting: Neurology

## 2018-11-08 NOTE — Telephone Encounter (Signed)
Spoke with pt. and reviewed MRI L-spine results (degen changes at mult. levels but no sig. foraminal or canal stenosis). She verbalized understanding of same/fim

## 2018-11-08 NOTE — Telephone Encounter (Signed)
Please call patient, MRI lumbar spine showed multiple level degenerative changes, there is no evidence of significant canal or foraminal stenosis  IMPRESSION: 1. Transitional S1 vertebral body. Mild lumbar dextrocurvature with apex at L5. No acute osseous abnormality. 2. Normal appearance of cauda equina.  No nerve root thickening. 3. Lumbar spondylosis greatest at L4-5 and L5-S1 levels where predominant left-sided disc and facet degenerative changes result in mild left foraminal stenosis. No high-grade foraminal or canal stenosis.

## 2018-11-15 ENCOUNTER — Ambulatory Visit
Admission: RE | Admit: 2018-11-15 | Discharge: 2018-11-15 | Disposition: A | Discharge status: Home / Self Care | Payer: Medicare Other | Source: Ambulatory Visit | Attending: Nurse Practitioner | Admitting: Nurse Practitioner

## 2018-11-15 DIAGNOSIS — Z1231 Encounter for screening mammogram for malignant neoplasm of breast: Secondary | ICD-10-CM | POA: Diagnosis not present

## 2018-11-20 DIAGNOSIS — J301 Allergic rhinitis due to pollen: Secondary | ICD-10-CM | POA: Diagnosis not present

## 2018-11-20 DIAGNOSIS — M79672 Pain in left foot: Secondary | ICD-10-CM | POA: Diagnosis not present

## 2018-11-20 DIAGNOSIS — L600 Ingrowing nail: Secondary | ICD-10-CM | POA: Diagnosis not present

## 2018-11-20 DIAGNOSIS — M2042 Other hammer toe(s) (acquired), left foot: Secondary | ICD-10-CM | POA: Diagnosis not present

## 2018-11-20 DIAGNOSIS — M79671 Pain in right foot: Secondary | ICD-10-CM | POA: Diagnosis not present

## 2018-11-20 DIAGNOSIS — M7672 Peroneal tendinitis, left leg: Secondary | ICD-10-CM | POA: Diagnosis not present

## 2018-11-20 DIAGNOSIS — M2041 Other hammer toe(s) (acquired), right foot: Secondary | ICD-10-CM | POA: Diagnosis not present

## 2018-11-20 DIAGNOSIS — M7671 Peroneal tendinitis, right leg: Secondary | ICD-10-CM | POA: Diagnosis not present

## 2018-11-20 DIAGNOSIS — B351 Tinea unguium: Secondary | ICD-10-CM | POA: Diagnosis not present

## 2018-11-27 DIAGNOSIS — J301 Allergic rhinitis due to pollen: Secondary | ICD-10-CM | POA: Diagnosis not present

## 2018-12-02 DIAGNOSIS — J301 Allergic rhinitis due to pollen: Secondary | ICD-10-CM | POA: Diagnosis not present

## 2018-12-11 ENCOUNTER — Telehealth: Payer: Self-pay | Admitting: Internal Medicine

## 2018-12-11 DIAGNOSIS — J301 Allergic rhinitis due to pollen: Secondary | ICD-10-CM | POA: Diagnosis not present

## 2018-12-11 NOTE — Telephone Encounter (Signed)
Arrival Date:12/11/2018 Lot #: [id number] Exp date: 06/2020

## 2018-12-16 DIAGNOSIS — J301 Allergic rhinitis due to pollen: Secondary | ICD-10-CM | POA: Diagnosis not present

## 2018-12-17 ENCOUNTER — Other Ambulatory Visit: Payer: Self-pay | Admitting: Nurse Practitioner

## 2018-12-17 DIAGNOSIS — I10 Essential (primary) hypertension: Secondary | ICD-10-CM

## 2018-12-19 ENCOUNTER — Ambulatory Visit (INDEPENDENT_AMBULATORY_CARE_PROVIDER_SITE_OTHER): Payer: Medicare Other

## 2018-12-19 ENCOUNTER — Other Ambulatory Visit: Payer: Self-pay

## 2018-12-19 DIAGNOSIS — J45909 Unspecified asthma, uncomplicated: Secondary | ICD-10-CM | POA: Diagnosis not present

## 2018-12-19 MED ORDER — BENRALIZUMAB 30 MG/ML ~~LOC~~ SOSY
30.0000 mg | PREFILLED_SYRINGE | Freq: Once | SUBCUTANEOUS | Status: AC
  Administered 2018-12-19: 30 mg via SUBCUTANEOUS

## 2018-12-21 ENCOUNTER — Encounter: Payer: Self-pay | Admitting: Nurse Practitioner

## 2019-01-22 ENCOUNTER — Ambulatory Visit: Payer: Medicare Other | Admitting: Neurology

## 2019-01-23 ENCOUNTER — Encounter: Payer: Self-pay | Admitting: Internal Medicine

## 2019-02-04 ENCOUNTER — Telehealth: Payer: Self-pay | Admitting: Internal Medicine

## 2019-02-04 DIAGNOSIS — J301 Allergic rhinitis due to pollen: Secondary | ICD-10-CM | POA: Diagnosis not present

## 2019-02-04 NOTE — Telephone Encounter (Signed)
Harrington Challenger Order: 30mg  #1 prefilled syringe Ordered date: 02/04/2019 Expected shipping date from pharmacy: 02/04/2019 Ordered by: Jaynee Eagles, CMA  Speciality [facility]acy: [facility]

## 2019-02-05 NOTE — Telephone Encounter (Signed)
Fasenra Shipment Received:  30mg  #1 prefilled syringe Medication arrival Date:02/05/2019 Lot #: [id number] Medication exp date: 04/2020 Received by: Jaynee Eagles, CMA

## 2019-02-06 DIAGNOSIS — J301 Allergic rhinitis due to pollen: Secondary | ICD-10-CM | POA: Diagnosis not present

## 2019-02-11 ENCOUNTER — Ambulatory Visit: Payer: Medicare Other | Admitting: Nurse Practitioner

## 2019-02-11 ENCOUNTER — Encounter: Payer: Self-pay | Admitting: Nurse Practitioner

## 2019-02-11 ENCOUNTER — Ambulatory Visit (INDEPENDENT_AMBULATORY_CARE_PROVIDER_SITE_OTHER): Payer: Medicare Other | Admitting: Nurse Practitioner

## 2019-02-11 VITALS — BP 142/89 | HR 67 | Wt 235.0 lb

## 2019-02-11 DIAGNOSIS — I10 Essential (primary) hypertension: Secondary | ICD-10-CM | POA: Diagnosis not present

## 2019-02-11 DIAGNOSIS — K219 Gastro-esophageal reflux disease without esophagitis: Secondary | ICD-10-CM

## 2019-02-11 DIAGNOSIS — E099 Drug or chemical induced diabetes mellitus without complications: Secondary | ICD-10-CM | POA: Diagnosis not present

## 2019-02-11 DIAGNOSIS — T380X5A Adverse effect of glucocorticoids and synthetic analogues, initial encounter: Secondary | ICD-10-CM

## 2019-02-11 DIAGNOSIS — G629 Polyneuropathy, unspecified: Secondary | ICD-10-CM

## 2019-02-11 MED ORDER — FUROSEMIDE 20 MG PO TABS: 10.0000 mg | ORAL_TABLET | Freq: Every day | ORAL | 1 refills | Status: DC

## 2019-02-11 NOTE — Assessment & Plan Note (Signed)
Mild elevation. Current use of furosemide 20mg .  continue to monitor bi weekly. She is to call office if >150/90. F/up in office in 45months

## 2019-02-11 NOTE — Assessment & Plan Note (Signed)
hgbA1c of 5.7 No medication use at the time. Fasting glucose of 81, no hypoglycemia symptoms

## 2019-02-11 NOTE — Assessment & Plan Note (Addendum)
Bilateral foot pain and paresthesia: Unable to tolerate cymbalta and gabapentin prescribed by neurology: Dr. Terrace Arabia, so she discontinued medications, she does not wish to return to neurology at this time. She was evaluated by podiatry, diagnosed with peroneal tendinitis, Ankle brace and naproxen recommended, she reports significant improvement.  NCV with EMG completed 10/2018: This is an abnormal study.  There is electrodiagnostic evidence of mild axonal sensorimotor polyneuropathy.  In addition, there is also evidence of mild right lumbosacral radiculopathy.

## 2019-02-11 NOTE — Progress Notes (Signed)
Virtual Visit via Telephone Note  I connected with Rachael Silva on 02/11/19 at  1:00 PM EDT by telephone and verified that I am speaking with the correct person using two identifiers.  Location: Patient: Home Provider: Office   I discussed the limitations, risks, security and privacy concerns of performing an evaluation and management service by telephone and the availability of in person appointments. I also discussed with the patient that there may be a patient responsible charge related to this service. The patient expressed understanding and agreed to proceed.  CC: 6 mo DM and HTN. Denies any new complaints  History of Present Illness: HTN: Stable with use of furosemide Reports compliance with CPAP machine BP Readings from Last 3 Encounters:  02/11/19 (!) 142/89  09/17/18 (!) 170/80  08/29/18 140/82   DM: Controlled with diet, no hypoglycemia episodes No use of oral prednisone at this time AM fasting: 81  Wt Readings from Last 3 Encounters:  02/11/19 235 lb (106.6 kg)  09/17/18 234 lb (106.1 kg)  08/29/18 230 lb (104.3 kg)   Bilateral foot pain and paresthesia: Unable to tolerate cymbalta and gabapentin prescribed by neurology: Dr. Terrace Arabia, so she discontinued medications, she does not wish to return to neurology at this time. She was evaluated by podiatry, diagnosed with peroneal tendinitis, Ankle brace and naproxen recommended, she reports significant improvement.   GERD: Asymptomatic with use of omeprazole OTC daily.  Review of Systems  Constitutional: Negative.   Respiratory: Negative.   Cardiovascular: Negative.   Gastrointestinal: Negative.   Genitourinary: Negative.   Musculoskeletal: Negative for falls.  Skin: Negative.   Neurological: Negative for dizziness and headaches.  Psychiatric/Behavioral: Negative.    Observations/Objective: Reviewed vital signs provided. Alert and oriented x4, clear speech and normal voice tone  Assessment and Plan: Rachael Silva  was seen today for follow-up.  Diagnoses and all orders for this visit:  Essential hypertension -     furosemide (LASIX) 20 MG tablet; Take 0.5 tablets (10 mg total) by mouth daily.  Gastroesophageal reflux disease, esophagitis presence not specified  Peripheral polyneuropathy  Steroid-induced diabetes mellitus (HCC)   Follow Up Instructions: Monitor Bp 2x/week. Call office if BP >150/90.  F/up in 33months (fasting)   I discussed the assessment and treatment plan with the patient. The patient was provided an opportunity to ask questions and all were answered. The patient agreed with the plan and demonstrated an understanding of the instructions.   The patient was advised to call back or seek an in-person evaluation if the symptoms worsen or if the condition fails to improve as anticipated.  I provided 13 minutes of non-face-to-face time during this encounter.   Alysia Penna, NP

## 2019-02-11 NOTE — Assessment & Plan Note (Signed)
Controlled with omeprazole 20mg daily 

## 2019-02-12 ENCOUNTER — Ambulatory Visit: Payer: Medicare Other | Admitting: Nurse Practitioner

## 2019-02-13 ENCOUNTER — Other Ambulatory Visit: Payer: Self-pay

## 2019-02-13 ENCOUNTER — Ambulatory Visit (INDEPENDENT_AMBULATORY_CARE_PROVIDER_SITE_OTHER): Payer: Medicare Other

## 2019-02-13 DIAGNOSIS — J45909 Unspecified asthma, uncomplicated: Secondary | ICD-10-CM | POA: Diagnosis not present

## 2019-02-13 MED ORDER — BENRALIZUMAB 30 MG/ML ~~LOC~~ SOSY
30.0000 mg | PREFILLED_SYRINGE | Freq: Once | SUBCUTANEOUS | Status: AC
  Administered 2019-02-13: 30 mg via SUBCUTANEOUS

## 2019-02-13 NOTE — Progress Notes (Signed)
Have you been hospitalized within the last 10 days?  No Do you have a fever?  No Do you have a cough?  No Do you have a headache or sore throat? No  

## 2019-02-20 ENCOUNTER — Telehealth: Payer: Self-pay | Admitting: *Deleted

## 2019-02-20 DIAGNOSIS — J301 Allergic rhinitis due to pollen: Secondary | ICD-10-CM | POA: Diagnosis not present

## 2019-02-20 NOTE — Telephone Encounter (Signed)
Called pt to make f/u appt with ss/np and pt did not want to make appt at this time.  She will call back.

## 2019-02-24 DIAGNOSIS — M2041 Other hammer toe(s) (acquired), right foot: Secondary | ICD-10-CM | POA: Diagnosis not present

## 2019-02-24 DIAGNOSIS — M7671 Peroneal tendinitis, right leg: Secondary | ICD-10-CM | POA: Diagnosis not present

## 2019-02-24 DIAGNOSIS — M7672 Peroneal tendinitis, left leg: Secondary | ICD-10-CM | POA: Diagnosis not present

## 2019-02-24 DIAGNOSIS — M2042 Other hammer toe(s) (acquired), left foot: Secondary | ICD-10-CM | POA: Diagnosis not present

## 2019-02-27 DIAGNOSIS — J301 Allergic rhinitis due to pollen: Secondary | ICD-10-CM | POA: Diagnosis not present

## 2019-03-06 DIAGNOSIS — J301 Allergic rhinitis due to pollen: Secondary | ICD-10-CM | POA: Diagnosis not present

## 2019-03-13 DIAGNOSIS — J301 Allergic rhinitis due to pollen: Secondary | ICD-10-CM | POA: Diagnosis not present

## 2019-03-20 DIAGNOSIS — J301 Allergic rhinitis due to pollen: Secondary | ICD-10-CM | POA: Diagnosis not present

## 2019-03-27 DIAGNOSIS — J301 Allergic rhinitis due to pollen: Secondary | ICD-10-CM | POA: Diagnosis not present

## 2019-03-31 ENCOUNTER — Other Ambulatory Visit: Payer: Self-pay | Admitting: Internal Medicine

## 2019-03-31 ENCOUNTER — Telehealth: Payer: Self-pay | Admitting: Internal Medicine

## 2019-03-31 NOTE — Telephone Encounter (Signed)
Fasenra Order: 30mg #1 prefilled syringe Ordered date: 03/31/2019 Expected date of arrival: 04/01/2019 Ordered by: Eziah Negro, CMA  [facility]ity [facility]  

## 2019-04-01 NOTE — Telephone Encounter (Signed)
Fasenra Shipment Received:  30mg #1 prefilled syringe Medication arrival date: 04/01/2019 Lot #: [id number] Exp date: 04/2020 Received by: TBS 

## 2019-04-03 DIAGNOSIS — J301 Allergic rhinitis due to pollen: Secondary | ICD-10-CM | POA: Diagnosis not present

## 2019-04-10 ENCOUNTER — Encounter: Payer: Self-pay | Admitting: Nurse Practitioner

## 2019-04-10 ENCOUNTER — Telehealth: Payer: Self-pay | Admitting: Internal Medicine

## 2019-04-10 ENCOUNTER — Other Ambulatory Visit: Payer: Self-pay

## 2019-04-10 ENCOUNTER — Ambulatory Visit (INDEPENDENT_AMBULATORY_CARE_PROVIDER_SITE_OTHER): Payer: Medicare Other

## 2019-04-10 DIAGNOSIS — J45909 Unspecified asthma, uncomplicated: Secondary | ICD-10-CM

## 2019-04-10 MED ORDER — BENRALIZUMAB 30 MG/ML ~~LOC~~ SOSY
30.0000 mg | PREFILLED_SYRINGE | Freq: Once | SUBCUTANEOUS | Status: AC
  Administered 2019-04-10: 15:00:00 30 mg via SUBCUTANEOUS

## 2019-04-10 NOTE — Progress Notes (Signed)
Have you been hospitalized within the last 10 days?  No Do you have a fever?  No Do you have a cough?  No Do you have a headache or sore throat? No  

## 2019-04-10 NOTE — Telephone Encounter (Signed)

## 2019-04-14 ENCOUNTER — Ambulatory Visit (INDEPENDENT_AMBULATORY_CARE_PROVIDER_SITE_OTHER): Payer: Medicare Other | Admitting: Internal Medicine

## 2019-04-14 ENCOUNTER — Other Ambulatory Visit: Payer: Self-pay

## 2019-04-14 ENCOUNTER — Encounter: Payer: Self-pay | Admitting: Internal Medicine

## 2019-04-14 VITALS — BP 140/86 | HR 74 | Temp 97.3°F | Ht 67.0 in | Wt 240.8 lb

## 2019-04-14 DIAGNOSIS — J4540 Moderate persistent asthma, uncomplicated: Secondary | ICD-10-CM

## 2019-04-14 NOTE — Patient Instructions (Signed)
avoid allergens Continue inhalers as prescribed Continue CPAP as prescribed Follow up with PCP about Bone density

## 2019-04-14 NOTE — Progress Notes (Signed)
MRN# [id number] Rachael Rachael Silva [birth year]  synopsis: 73 year old female past medical history of asthma. Being followed by pulmonary for chronic eosinophilic pneumonia, status post bronchoscopy in December 2016 with negative cultures for AFB and fungus. Transbronchial biopsy-positive for eosinophilic infiltrate. Treated with steroids unable to wean since  October 2015.   CC: Follow up ASTHMA Follow up OSA   HPI  Patient follow up ASTHMA H/o ABPA previous h/o chronic prednisone therapy Well controlled now with Fasenra injections Her asthma seems to be well-controlled at this time currently on BREO 2 puffs twice daily Continue spiriva as prescribed  Has intermittent wheezing Still surrounded by cats  No signs of infection No wheezing Chronic DOE and SOB  Compliance report shows 93% for 30 days 83% compliance for greater than 4 hrs AHI down to 0.9   She is constantly being exposed to allergens and cats at home    Review of Systems:  Gen:  Denies  fever, sweats, chills weigh loss  HEENT: Denies blurred vision, double vision, ear pain, eye pain, hearing loss, nose bleeds, sore throat Cardiac:  No dizziness, chest pain or heaviness, chest tightness,edema, No JVD Resp:   No cough, -sputum production, -shortness of breath,+wheezing, -hemoptysis,  Gi: Denies swallowing difficulty, stomach pain, nausea or vomiting, diarrhea, constipation, bowel incontinence Gu:  Denies bladder incontinence, burning urine Ext:   Denies Joint pain, stiffness or swelling Skin: Denies  skin rash, easy bruising or bleeding or hives Endoc:  Denies polyuria, polydipsia , polyphagia or weight change Psych:   Denies depression, insomnia or hallucinations  Other:  All other systems negative     Allergies:  Lisinopril, Atorvastatin, and Erythromycin  BP 140/86 (BP Location: Left Arm, Cuff Size: Normal)   Pulse 74   Temp (!) 97.3 F (36.3 C) (Skin)   Ht 5\' 7"  (1.702 m)   Wt 240 lb 12.8 oz (109.2 kg)    SpO2 96%   BMI 37.71 kg/m   Physical Examination:   GENERAL:NAD, no fevers, chills, no weakness no fatigue HEAD: Normocephalic, atraumatic.  EYES: PERLA, EOMI No scleral icterus.  NECK: Supple. No thyromegaly.  No JVD.  PULMONARY: CTA B/L no wheezing, rhonchi, crackles CARDIOVASCULAR: S1 and S2. Regular rate and rhythm. No murmurs GASTROINTESTINAL: Soft, nontender, nondistended. Positive bowel sounds.  MUSCULOSKELETAL: No swelling, clubbing, or edema.  NEUROLOGIC: No gross focal neurological deficits. 5/5 strength all extremities SKIN: No ulceration, lesions, rashes, or cyanosis.  PSYCHIATRIC: Insight, judgment intact. -depression -anxiety ALL OTHER ROS ARE NEGATIVE          LABS Results for Rachael DanaMILLS, Rachael Silva (MRN [id number]) as of 08/01/2016 11:16  Ref. Range 07/02/2014 13:00 08/05/2015 15:12 03/30/2016 15:11  IgE (Immunoglobulin E), Serum Latest Ref Range: 0 - 100 IU/mL 2,401 (H) 1,253 (H) 1,180 (H)   Results for Rachael DanaMILLS, Rachael Silva (MRN [id number]) as of 08/01/2016 11:16  Ref. Range 08/05/2015 15:12 09/16/2015 12:46 03/30/2016 15:11 05/03/2016 08:42 07/26/2016 15:31  Eosinophil Latest Units: % 10 11 5  5.4 (H) 3        Assessment and Plan:   73 year old pleasant female seen today for follow-up of your recurrent upper respiratory tract infections with history of severe persistent asthma which seems to be under control with immunological injections as well as inhaler therapy  Patient does have an underlying diagnosis of obstructive sleep apnea on auto CPAP therapy   Her current living conditions include carpet and cats which she is allergic to I have advised to limit her exposure to avoid asthma exacerbations  Asthma chronic eosinophilic  pneumonia Severe persistent chronic asthma Currently controlled with Berna Bue and inhaler therapy Continue inhaled steroids at this time Continue Spiriva Respimat Albuterol as needed No indication for prednisone at this time Avoid  allergens   Allergic rhinitis Continue antihistamine as prescribed Continue Singulair and Flonase   History of ABPA No indication for prednisone at this time Bronchoscopy with BAL in 2016 was consistent with eosinophilic pneumonia   OSA on CPAP Continue auto CPAP as prescribed    COVID-19 EDUCATION: The signs and symptoms of COVID-19 were discussed with the patient and how to seek care for testing.  The importance of social distancing was discussed today. Hand Washing Techniques and avoid touching face was advised.  MEDICATION ADJUSTMENTS/LABS AND TESTS ORDERED:  CURRENT MEDICATIONS REVIEWED AT LENGTH WITH PATIENT TODAY   Patient  satisfied with Plan of action and management. All questions answered Follow up in 6 months   Yann Biehn Patricia Pesa, M.D.  [facility]  Medical Director [place] Director [facility] Cardio-Pulmonary Department

## 2019-04-16 DIAGNOSIS — J301 Allergic rhinitis due to pollen: Secondary | ICD-10-CM | POA: Diagnosis not present

## 2019-04-22 ENCOUNTER — Other Ambulatory Visit: Payer: Self-pay | Admitting: Internal Medicine

## 2019-04-23 DIAGNOSIS — J301 Allergic rhinitis due to pollen: Secondary | ICD-10-CM | POA: Diagnosis not present

## 2019-04-25 ENCOUNTER — Telehealth: Payer: Self-pay | Admitting: Nurse Practitioner

## 2019-04-25 DIAGNOSIS — J301 Allergic rhinitis due to pollen: Secondary | ICD-10-CM | POA: Diagnosis not present

## 2019-04-25 NOTE — Telephone Encounter (Signed)
I called and left message on patient voicemail to call office and reschedule appointment for 05/15/2019 with Nche, provider not in office.

## 2019-04-30 DIAGNOSIS — J301 Allergic rhinitis due to pollen: Secondary | ICD-10-CM | POA: Diagnosis not present

## 2019-05-07 DIAGNOSIS — J301 Allergic rhinitis due to pollen: Secondary | ICD-10-CM | POA: Diagnosis not present

## 2019-05-13 ENCOUNTER — Ambulatory Visit: Payer: Medicare Other | Admitting: Nurse Practitioner

## 2019-05-14 DIAGNOSIS — J301 Allergic rhinitis due to pollen: Secondary | ICD-10-CM | POA: Diagnosis not present

## 2019-05-15 ENCOUNTER — Ambulatory Visit: Payer: Medicare Other | Admitting: Nurse Practitioner

## 2019-05-21 DIAGNOSIS — J301 Allergic rhinitis due to pollen: Secondary | ICD-10-CM | POA: Diagnosis not present

## 2019-05-26 ENCOUNTER — Telehealth: Payer: Self-pay | Admitting: Internal Medicine

## 2019-05-26 NOTE — Telephone Encounter (Signed)
Berna Bue Order: 30mg  #1 prefilled syringe Ordered date: 05/26/19 Expected date of arrival: 05/27/19 Ordered by: Tonna Corner Speciality Pharmacy: [facility]

## 2019-05-27 NOTE — Telephone Encounter (Signed)
Fasenra Shipment Received:  30mg #1 prefilled syringe Medication arrival date: 05/27/19 Lot #: [id number] Exp date: 07/09/2020 Received by: Irvan Tiedt T,LPN 

## 2019-06-04 ENCOUNTER — Other Ambulatory Visit: Payer: Self-pay | Admitting: Internal Medicine

## 2019-06-05 ENCOUNTER — Ambulatory Visit (INDEPENDENT_AMBULATORY_CARE_PROVIDER_SITE_OTHER): Payer: Medicare Other

## 2019-06-05 ENCOUNTER — Other Ambulatory Visit: Payer: Self-pay

## 2019-06-05 DIAGNOSIS — J4540 Moderate persistent asthma, uncomplicated: Secondary | ICD-10-CM

## 2019-06-05 MED ORDER — BENRALIZUMAB 30 MG/ML ~~LOC~~ SOSY
30.0000 mg | PREFILLED_SYRINGE | Freq: Once | SUBCUTANEOUS | Status: AC
  Administered 2019-06-05: 15:00:00 30 mg via SUBCUTANEOUS

## 2019-06-05 MED ORDER — EPINEPHRINE 0.3 MG/0.3ML IJ SOAJ: 0.3000 mg | Freq: Once | INTRAMUSCULAR | 5 refills | Status: AC

## 2019-06-05 NOTE — Progress Notes (Signed)
Have you been hospitalized within the last 10 days?  No Do you have a fever?  No Do you have a cough?  No Do you have a headache or sore throat? No Do you have your Epi Pen visible and is it within date?  Yes  Epipen expires 05/2019.  New Epipen prescription sent to requested [facility].

## 2019-06-07 IMAGING — MG DIGITAL SCREENING BILATERAL MAMMOGRAM WITH TOMO AND CAD
6 of 10 series · 6 of 30 positions shown · non-contrast
Comparison: Previous exam(s).

CLINICAL DATA: Screening.

EXAM:
DIGITAL SCREENING BILATERAL MAMMOGRAM WITH TOMO AND CAD

[R CC synth-2D]
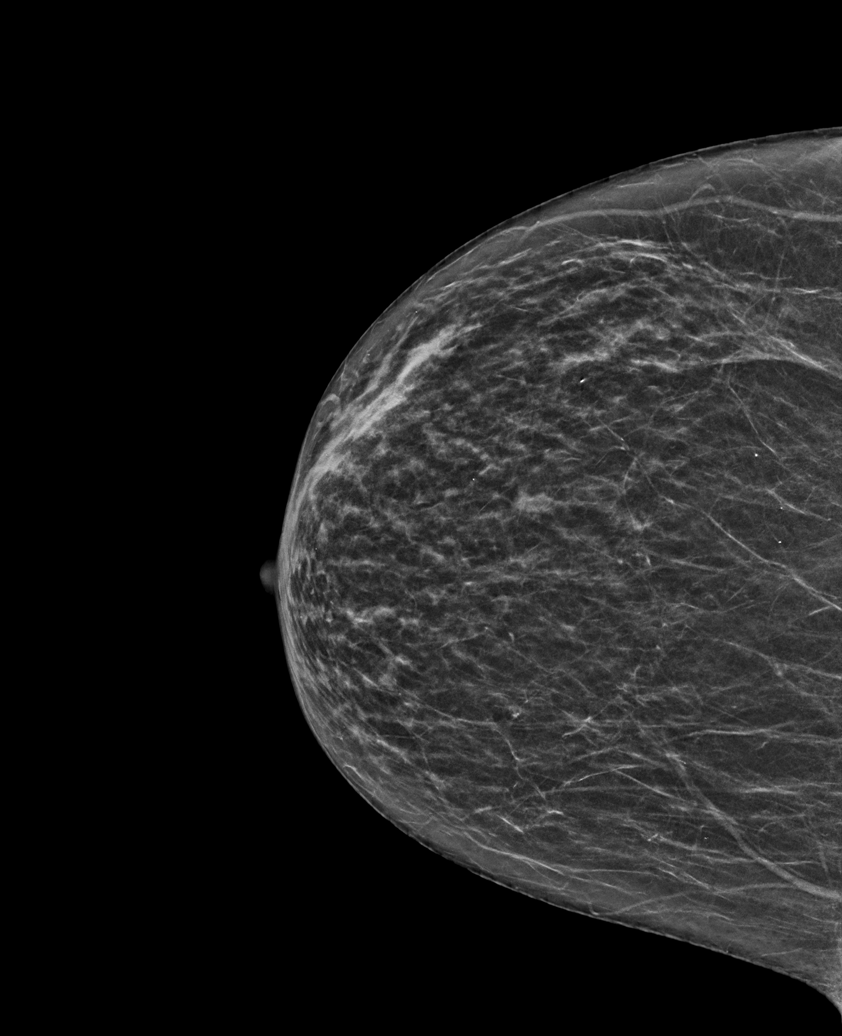

[R MLO synth-2D (1 of 2)]
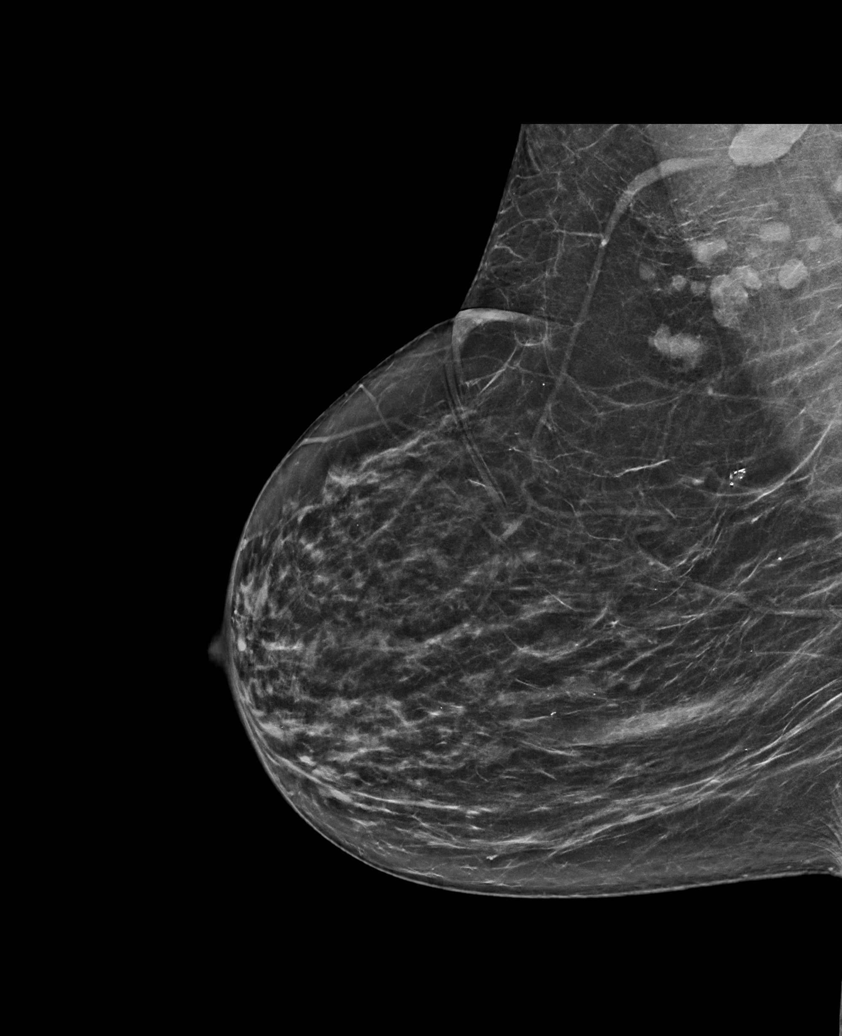

[R MLO synth-2D (2 of 2)]
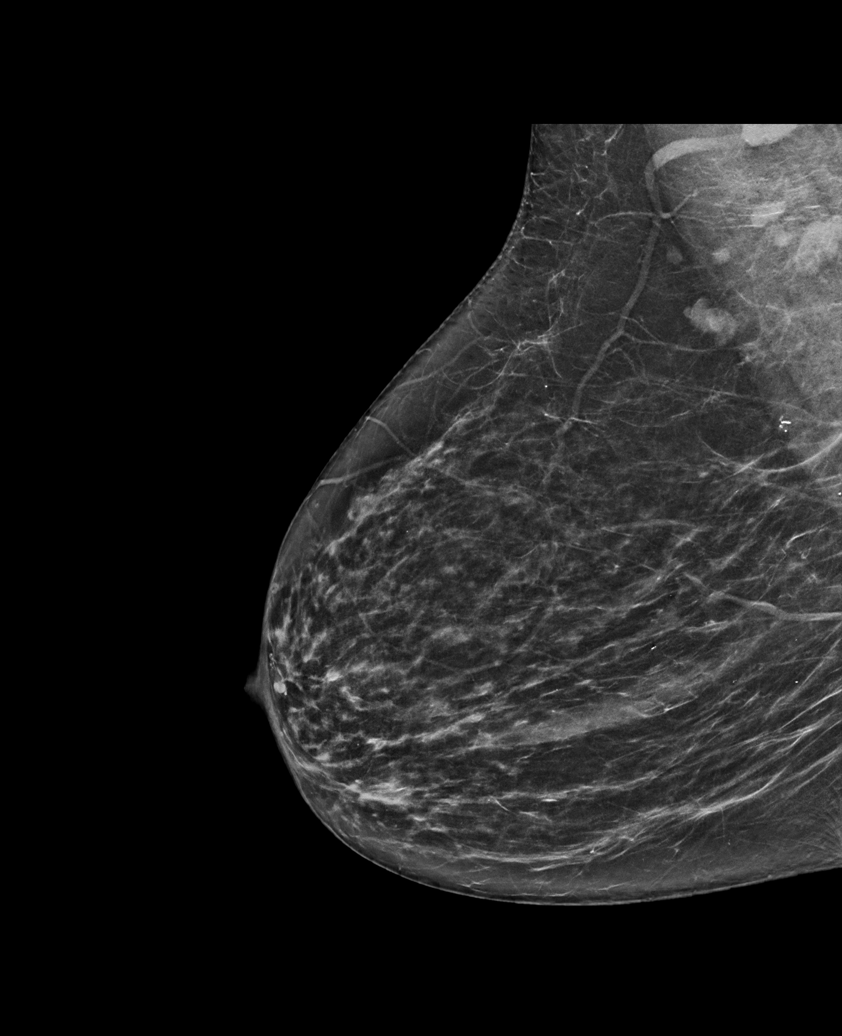

[L MLO synth-2D]
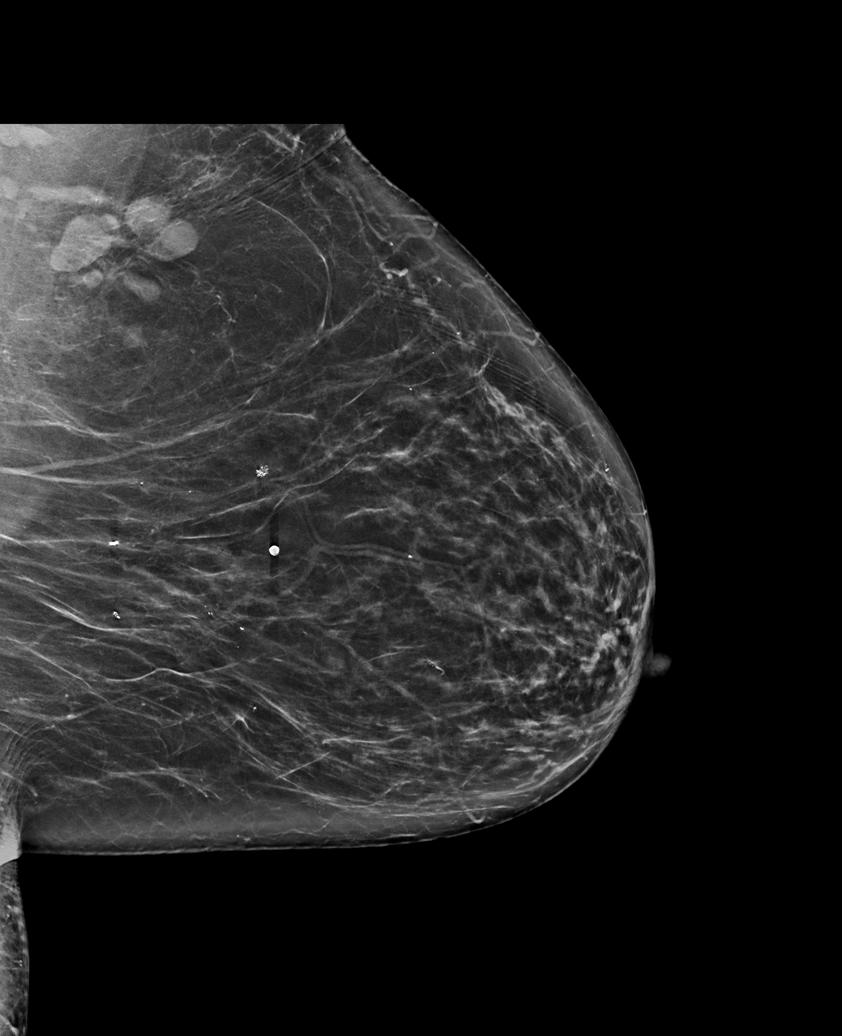

[L CC synth-2D]
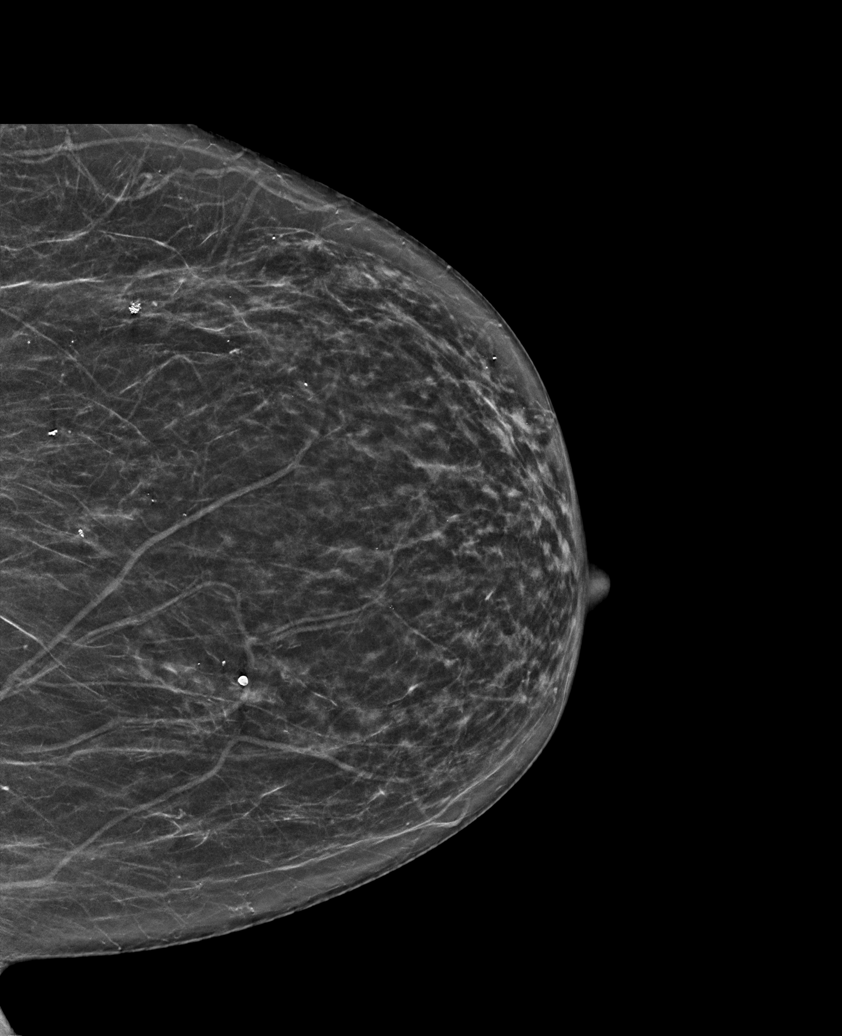

[R MLO tomo · tomo slice 35/69.0]
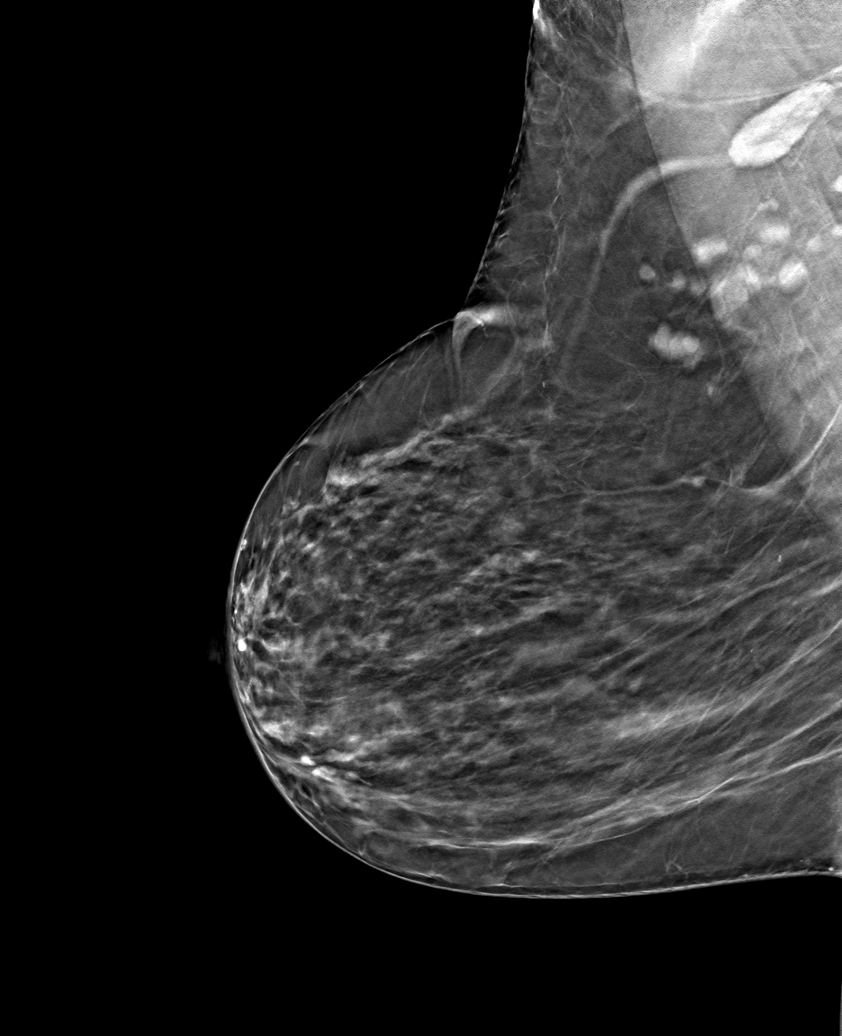

[6 of 30 positions shown; findings below may reference images not displayed]

ACR Breast Density Category b: There are scattered areas of
fibroglandular density.
FINDINGS: There are no findings suspicious for malignancy. Images were
processed with CAD.
IMPRESSION: No mammographic evidence of malignancy. A result letter of this
screening mammogram will be mailed directly to the patient.

RECOMMENDATION:
Screening mammogram in one year. (Code:[id number])

BI-RADS CATEGORY  1: Negative.

## 2019-06-09 ENCOUNTER — Other Ambulatory Visit: Payer: Self-pay

## 2019-06-10 ENCOUNTER — Ambulatory Visit (INDEPENDENT_AMBULATORY_CARE_PROVIDER_SITE_OTHER): Payer: Medicare Other

## 2019-06-10 ENCOUNTER — Encounter: Payer: Self-pay | Admitting: Nurse Practitioner

## 2019-06-10 DIAGNOSIS — Z23 Encounter for immunization: Secondary | ICD-10-CM | POA: Diagnosis not present

## 2019-06-10 NOTE — Progress Notes (Signed)
After obtaining consent, and per orders of Wilfred Lacy, NP, injection of HD Flu given in Left Deltoid by Cincere Zorn Berneta Sages. Patient instructed to remain in clinic for 20 minutes afterwards, and to report any adverse reaction to me immediately.thx dmf

## 2019-06-11 DIAGNOSIS — J301 Allergic rhinitis due to pollen: Secondary | ICD-10-CM | POA: Diagnosis not present

## 2019-06-12 ENCOUNTER — Telehealth: Payer: Self-pay | Admitting: Internal Medicine

## 2019-06-12 NOTE — Telephone Encounter (Signed)
[facility].3. attempted to complete PA via covermymeds and was advised to contact [facility] at [phone]. Spoke to [place] with [phone] and was advised that letter of medical necessity is needed. Fax number is [phone]. Letter has been faxed to provided fax number. Will await determination.

## 2019-06-17 ENCOUNTER — Ambulatory Visit: Payer: Medicare Other | Admitting: Nurse Practitioner

## 2019-06-18 DIAGNOSIS — J301 Allergic rhinitis due to pollen: Secondary | ICD-10-CM | POA: Diagnosis not present

## 2019-06-18 NOTE — Telephone Encounter (Addendum)
Contacted [id number] for update on epinephrine PA. I spoke to Adena, who stated that medical necessity form was not received. I have re faxed this letter per [facility] request.   Pt's ID number is [phone].

## 2019-06-23 ENCOUNTER — Telehealth: Payer: Self-pay | Admitting: Internal Medicine

## 2019-06-23 NOTE — Telephone Encounter (Signed)
Fasenra Order: 30mg #1 prefilled syringe Ordered date: 06/23/2019 Expected date of arrival: 06/24/2019 Ordered by: Mili Piltz, CMA  Specialty Pharm[facility]: [facility]  

## 2019-06-24 ENCOUNTER — Encounter: Payer: Self-pay | Admitting: Nurse Practitioner

## 2019-06-24 ENCOUNTER — Other Ambulatory Visit: Payer: Self-pay

## 2019-06-24 ENCOUNTER — Ambulatory Visit (INDEPENDENT_AMBULATORY_CARE_PROVIDER_SITE_OTHER): Payer: Medicare Other | Admitting: Nurse Practitioner

## 2019-06-24 VITALS — BP 140/64 | HR 79 | Temp 98.2°F | Ht 67.0 in | Wt 250.2 lb

## 2019-06-24 DIAGNOSIS — T380X5A Adverse effect of glucocorticoids and synthetic analogues, initial encounter: Secondary | ICD-10-CM

## 2019-06-24 DIAGNOSIS — E782 Mixed hyperlipidemia: Secondary | ICD-10-CM

## 2019-06-24 DIAGNOSIS — I10 Essential (primary) hypertension: Secondary | ICD-10-CM | POA: Diagnosis not present

## 2019-06-24 DIAGNOSIS — R748 Abnormal levels of other serum enzymes: Secondary | ICD-10-CM

## 2019-06-24 DIAGNOSIS — E099 Drug or chemical induced diabetes mellitus without complications: Secondary | ICD-10-CM

## 2019-06-24 NOTE — Progress Notes (Addendum)
Subjective:  Patient ID: Rachael Silva, female    DOB: 1946-05-24  Age: 73 y.o. MRN: [id number]  CC: Follow-up (follow up DM and BP/fasting for labs, also check TSH)/not fasting--)  HPI  HTN: Stable BP with furosemide Admits to non compliance with DASH diet and regular exercise. Compliant with CPAP machine Denies any worsening SOB or dizziness or LE edema, and no chest pain. BP Readings from Last 3 Encounters:  06/24/19 140/64  04/14/19 140/86  02/11/19 (!) 142/89   DM: steroid induced, does not check glucose at home,  last hgbA1c of 6.0 She is to schedule annual eye exam, up to date with foot exam (neuropathy),  Negative urine microalbumin up to date with pneumonia and influenza vaccine,  LDL not at goal (LDL at 123, decline statin at this time).  Reviewed past Medical, Social and Family history today.  Outpatient Medications Prior to Visit  Medication Sig Dispense Refill  . albuterol (ACCUNEB) 0.63 MG/3ML nebulizer solution Take 3 mLs (0.63 mg total) by nebulization every 6 (six) hours as needed for wheezing or shortness of breath. 75 mL 0  . albuterol (VENTOLIN HFA) 108 (90 Base) MCG/ACT inhaler TAKE 1 TO 2 PUFFS EVERY 6 HOURS AS NEEDED 18 g 11  . B Complex-C-Folic Acid (STRESS B COMPLEX PO) Take 1 tablet by mouth daily.    . Calcium Carbonate-Vitamin D (CALTRATE 600+D PO) Take 1 tablet by mouth 2 (two) times daily.    . cetirizine (ZYRTEC) 10 MG tablet TAKE 1 TABLET BY MOUTH EVERY DAY 90 tablet 2  . co-enzyme Q-10 30 MG capsule Take 200 mg by mouth daily.    . diphenhydrAMINE (BENADRYL ALLERGY) 25 MG tablet Take 1 tablet (25 mg total) by mouth every 8 (eight) hours for 1 day, THEN 1 tablet (25 mg total) every 12 (twelve) hours for 2 days, THEN 1 tablet (25 mg total) every 8 (eight) hours as needed for up to 2 days for itching. 30 tablet 0  . EPIPEN 2-PAK 0.3 MG/0.3ML SOAJ injection See admin instructions.  99  . fluticasone furoate-vilanterol (BREO ELLIPTA) 200-25 MCG/INH  AEPB Inhale 1 puff into the lungs daily. 1 each 0  . Fluticasone-Salmeterol (WIXELA INHUB) 250-50 MCG/DOSE AEPB Inhale 1 puff into the lungs 2 (two) times daily. 60 each 5  . GARLIC PO Take 1 capsule by mouth daily.    . montelukast (SINGULAIR) 10 MG tablet Take 1 tablet (10 mg total) by mouth daily. 90 tablet 2  . Multiple Vitamins-Minerals (CENTRUM ADULTS PO) Take 1 tablet by mouth daily.    . naproxen (NAPROSYN) 500 MG tablet Take by mouth.    Marland Kitchen omeprazole (PRILOSEC) 20 MG capsule Take 1 capsule (20 mg total) by mouth daily.    . ONE TOUCH ULTRA TEST test strip USE TO TEST TWICE DAILY DX E09.9 100 each 0  . ONETOUCH DELICA LANCETS 17G MISC USE TO TEST SUGARS TWICE DAILY DX E09.9 100 each 0  . SPIRIVA RESPIMAT 1.25 MCG/ACT AERS TAKE 2 PUFFS BY MOUTH TWICE A DAY 4 g 1  . triamcinolone ointment (KENALOG) 0.1 % APPLY TO AFFECTED AREA TWICE A DAY 80 g 0  . furosemide (LASIX) 20 MG tablet Take 0.5 tablets (10 mg total) by mouth daily. 45 tablet 1   Facility-Administered Medications Prior to Visit  Medication Dose Route Frequency Provider Last Rate Last Dose  . omalizumab Arvid Right) injection 375 mg  375 mg Subcutaneous Q14 Days Flora Lipps, MD   375 mg at 08/16/17 1249  .  omalizumab Arvid Right) injection 375 mg  375 mg Subcutaneous Q14 Days Flora Lipps, MD   375 mg at 09/25/17 1638  . omalizumab Arvid Right) injection 375 mg  375 mg Subcutaneous Q14 Days Flora Lipps, MD   375 mg at 10/11/17 1625    ROS See HPI  Objective:  BP 140/64   Pulse 79   Temp 98.2 F (36.8 C) (Tympanic)   Ht 5' 7" (1.702 m)   Wt 250 lb 3.2 oz (113.5 kg)   SpO2 98%   BMI 39.19 kg/m   BP Readings from Last 3 Encounters:  06/24/19 140/64  04/14/19 140/86  02/11/19 (!) 142/89   Wt Readings from Last 3 Encounters:  06/24/19 250 lb 3.2 oz (113.5 kg)  04/14/19 240 lb 12.8 oz (109.2 kg)  02/11/19 235 lb (106.6 kg)   Physical Exam Vitals signs reviewed.  Cardiovascular:     Rate and Rhythm: Normal rate and  regular rhythm.     Pulses: Normal pulses.     Heart sounds: Murmur present.  Pulmonary:     Effort: Pulmonary effort is normal.     Breath sounds: Normal breath sounds.  Musculoskeletal:     Right lower leg: No edema.     Left lower leg: No edema.  Skin:    General: Skin is warm and dry.  Neurological:     Mental Status: She is alert and oriented to person, place, and time.  Psychiatric:        Mood and Affect: Mood normal.        Behavior: Behavior normal.    Lab Results  Component Value Date   WBC 3.8 07/01/2018   HGB 13.9 07/01/2018   HCT 41.0 07/01/2018   PLT 209 07/01/2018   GLUCOSE 83 06/26/2019   CHOL 212 (H) 06/26/2019   TRIG 63.0 06/26/2019   HDL 72.90 06/26/2019   LDLDIRECT 138.9 04/07/2011   LDLCALC 127 (H) 06/26/2019   ALT 11 06/26/2019   AST 13 06/26/2019   NA 139 06/26/2019   K 4.0 06/26/2019   CL 103 06/26/2019   CREATININE 0.92 06/26/2019   BUN 18 06/26/2019   CO2 30 06/26/2019   TSH 2.72 05/09/2018   HGBA1C 6.2 06/26/2019   MICROALBUR <0.7 06/26/2019    Mm 3d Screen Breast Bilateral  Result Date: 11/15/2018 CLINICAL DATA:  Screening. EXAM: DIGITAL SCREENING BILATERAL MAMMOGRAM WITH TOMO AND CAD COMPARISON:  Previous exam(s). ACR Breast Density Category b: There are scattered areas of fibroglandular density. FINDINGS: There are no findings suspicious for malignancy. Images were processed with CAD. IMPRESSION: No mammographic evidence of malignancy. A result letter of this screening mammogram will be mailed directly to the patient. RECOMMENDATION: Screening mammogram in one year. (Code:[id number]) BI-RADS CATEGORY  1: Negative. Electronically Signed   By: Fidela Salisbury M.D.   On: 11/15/2018 16:33    Assessment & Plan:   [name] was seen today for follow-up.  Diagnoses and all orders for this visit:  Essential hypertension -     Basic metabolic panel; Future -     furosemide (LASIX) 20 MG tablet; Take 0.5 tablets (10 mg total) by mouth daily.   Steroid-induced diabetes mellitus (HCC) -     Hemoglobin A1c; Future -     Microalbumin / creatinine urine ratio; Future  Mixed hyperlipidemia -     Lipid panel; Future  Elevated liver enzymes -     Hepatic function panel; Future   I am having [name] maintain her Multiple Vitamins-Minerals (CENTRUM  ADULTS PO), Calcium Carbonate-Vitamin D (CALTRATE 600+D PO), co-enzyme H-54, B Complex-C-Folic Acid (STRESS B COMPLEX PO), GARLIC PO, EpiPen 2-Pak, OneTouch Delica Lancets 56Y, ONE TOUCH ULTRA TEST, diphenhydrAMINE, albuterol, albuterol, triamcinolone ointment, omeprazole, Fluticasone-Salmeterol, montelukast, fluticasone furoate-vilanterol, naproxen, cetirizine, Spiriva Respimat, and furosemide. We will continue to administer omalizumab, omalizumab, and omalizumab.  Meds ordered this encounter  Medications  . furosemide (LASIX) 20 MG tablet    Sig: Take 0.5 tablets (10 mg total) by mouth daily.    Dispense:  45 tablet    Refill:  1    Order Specific Question:   Supervising Provider    Answer:   Lucille Passy [[id number]]    Problem List Items Addressed This Visit      Cardiovascular and Mediastinum   Essential hypertension - Primary    stable BP with furosemide Admits to non compliance with DASH diet and regular exercise. Compliant with CPAP machine Denies any worsening SOB or dizziness or LE edema, and no chest pain. BP Readings from Last 3 Encounters:  06/24/19 140/64  04/14/19 140/86  02/11/19 (!) 142/89   Echocardiogram done 03/2018: Left ventricle: The cavity size was normal. Wall thickness was   increased in a pattern of mild LVH. Systolic function was   vigorous. The estimated ejection fraction was in the range of 65%   to 70%. Doppler parameters are consistent with abnormal left   ventricular relaxation (grade 1 diastolic dysfunction). - Right ventricle: The cavity size was normal. Wall thickness was   normal. Systolic function was normal. - Tricuspid valve: There  was mild-moderate regurgitation. - Pulmonary arteries: Systolic pressure was mildly increased, in   the range of 35 mm Hg to 40 mm Hg.      Relevant Medications   furosemide (LASIX) 20 MG tablet   Other Relevant Orders   Basic metabolic panel (Completed)     Endocrine   Steroid-induced diabetes mellitus (HCC)    steroid induced, does not check glucose at home,  last hgbA1c of 6.0 Negative urine microalbumin She is to schedule annual eye exam, up to date with foot exam (neuropathy),  up to date with pneumonia and influenza vaccine,  LDL not at goal (LDL at 123, decline statin at this time).  Stable HgbA1c at 6.2 and normal urine microalbumin. Consider use of zetia to improve LDL?      Relevant Orders   Hemoglobin A1c (Completed)   Microalbumin / creatinine urine ratio (Completed)     Other   Elevated liver enzymes    Hepatic Function Latest Ref Rng & Units 06/26/2019 10/18/2018 02/25/2018  Total Protein 6.0 - 8.3 g/dL 7.6 7.5 7.8  Albumin 3.5 - 5.2 g/dL 4.1 - 3.1(L)  AST 0 - 37 U/L 13 - 60(H)  ALT 0 - 35 U/L 11 - 61(H)  Alk Phosphatase 39 - 117 U/L 101 - 277(H)  Total Bilirubin 0.2 - 1.2 mg/dL 0.4 - 0.7  Bilirubin, Direct 0.0 - 0.3 mg/dL 0.1 - -   normal      Relevant Orders   Hepatic function panel (Completed)   Hyperlipidemia    Abnormal lipid panel with elevated TC and LDL. With hx of statin intolerance. Ask if she is willing to take zetia to decrease risk for cardiovascular disease?  Lipid Panel     Component Value Date/Time   CHOL 212 (H) 06/26/2019 0914   TRIG 63.0 06/26/2019 0914   HDL 72.90 06/26/2019 0914   CHOLHDL 3 06/26/2019 0914   VLDL 12.6 06/26/2019 0914  [place] 127 (H) 06/26/2019 0914   LDLDIRECT 138.9 04/07/2011 0838        Relevant Medications   furosemide (LASIX) 20 MG tablet   Other Relevant Orders   Lipid panel (Completed)      Follow-up: Return in about 6 months (around 12/22/2019) for DM and HTN, hyperlipidemia (fasting).   Wilfred Lacy, NP

## 2019-06-24 NOTE — Assessment & Plan Note (Addendum)
steroid induced, does not check glucose at home,  last hgbA1c of 6.0 Negative urine microalbumin She is to schedule annual eye exam, up to date with foot exam (neuropathy),  up to date with pneumonia and influenza vaccine,  LDL not at goal (LDL at 123, decline statin at this time).  Stable HgbA1c at 6.2 and normal urine microalbumin. Consider use of zetia to improve LDL?

## 2019-06-24 NOTE — Telephone Encounter (Signed)
Spoke to [place] with [phone], who stated that epinephrine was approved.  Pt is aware and voiced her understanding. Nothing further is needed.

## 2019-06-24 NOTE — Patient Instructions (Addendum)
Please resume daily exercise and DASH diet to help maintain BP<140/80 and improve LE edema.  We ordered another Cologuard Kit to be sent to you. Please schedule annual eye exam and have report faxed to me.  Go to lab for blood draw (need to be fasting at least 6hrs prior to blood draw). Ok to drink water. [facility] [employer] [address] [place], [place].  Stable liver, renal, and HgbA1c Abnormal lipid panel with elevated TC and LDL. With hx of statin intolerance. Ask if she is willing to take zetia to decrease risk for cardiovascular disease.

## 2019-06-24 NOTE — Telephone Encounter (Signed)
Fasenra Shipment Received:  30mg  #1 prefilled syringe Medication arrival date: 06/24/19 Lot #: [id number] Exp date: 07/09/2020 Received by: Elliot Dally

## 2019-06-24 NOTE — Assessment & Plan Note (Signed)
stable BP with furosemide Admits to non compliance with DASH diet and regular exercise. Compliant with CPAP machine Denies any worsening SOB or dizziness or LE edema, and no chest pain. BP Readings from Last 3 Encounters:  06/24/19 140/64  04/14/19 140/86  02/11/19 (!) 142/89   Echocardiogram done 03/2018: Left ventricle: The cavity size was normal. Wall thickness was   increased in a pattern of mild LVH. Systolic function was   vigorous. The estimated ejection fraction was in the range of 65%   to 70%. Doppler parameters are consistent with abnormal left   ventricular relaxation (grade 1 diastolic dysfunction). - Right ventricle: The cavity size was normal. Wall thickness was   normal. Systolic function was normal. - Tricuspid valve: There was mild-moderate regurgitation. - Pulmonary arteries: Systolic pressure was mildly increased, in   the range of 35 mm Hg to 40 mm Hg.

## 2019-06-25 ENCOUNTER — Ambulatory Visit: Payer: Medicare Other | Admitting: Nurse Practitioner

## 2019-06-25 DIAGNOSIS — J301 Allergic rhinitis due to pollen: Secondary | ICD-10-CM | POA: Diagnosis not present

## 2019-06-26 ENCOUNTER — Ambulatory Visit: Payer: Medicare Other | Admitting: Nurse Practitioner

## 2019-06-26 ENCOUNTER — Other Ambulatory Visit: Payer: Self-pay

## 2019-06-26 ENCOUNTER — Telehealth: Payer: Self-pay | Admitting: *Deleted

## 2019-06-26 ENCOUNTER — Other Ambulatory Visit (INDEPENDENT_AMBULATORY_CARE_PROVIDER_SITE_OTHER): Payer: Medicare Other

## 2019-06-26 DIAGNOSIS — E782 Mixed hyperlipidemia: Secondary | ICD-10-CM | POA: Diagnosis not present

## 2019-06-26 DIAGNOSIS — E099 Drug or chemical induced diabetes mellitus without complications: Secondary | ICD-10-CM

## 2019-06-26 DIAGNOSIS — R748 Abnormal levels of other serum enzymes: Secondary | ICD-10-CM | POA: Diagnosis not present

## 2019-06-26 DIAGNOSIS — I10 Essential (primary) hypertension: Secondary | ICD-10-CM

## 2019-06-26 DIAGNOSIS — T380X5A Adverse effect of glucocorticoids and synthetic analogues, initial encounter: Secondary | ICD-10-CM

## 2019-06-26 LAB — BASIC METABOLIC PANEL
BUN: 18 mg/dL (ref 6–23)
CO2: 30 mEq/L (ref 19–32)
Calcium: 9.6 mg/dL (ref 8.4–10.5)
Chloride: 103 mEq/L (ref 96–112)
Creatinine, Ser: 0.92 mg/dL (ref 0.40–1.20)
GFR: 72.73 mL/min (ref >60.00)
Glucose, Bld: 83 mg/dL (ref 70–99)
Potassium: 4 mEq/L (ref 3.5–5.1)
Sodium: 139 mEq/L (ref 135–145)

## 2019-06-26 LAB — MICROALBUMIN / CREATININE URINE RATIO
Creatinine,U: 74.7 mg/dL
Microalb Creat Ratio: 0.9 mg/g (ref 0.0–30.0)
Microalb, Ur: <0.7 mg/dL (ref 0.0–1.9)

## 2019-06-26 LAB — HEMOGLOBIN A1C: Hgb A1c MFr Bld: 6.2 % (ref 4.6–6.5)

## 2019-06-26 LAB — LIPID PANEL
Cholesterol: 212 mg/dL — ABNORMAL HIGH (ref 0–200)
HDL: 72.9 mg/dL (ref >39.00)
LDL Cholesterol: 127 mg/dL — ABNORMAL HIGH (ref 0–99)
NonHDL: 139.34
Total CHOL/HDL Ratio: 3
Triglycerides: 63 mg/dL (ref 0.0–149.0)
VLDL: 12.6 mg/dL (ref 0.0–40.0)

## 2019-06-26 LAB — HEPATIC FUNCTION PANEL
ALT: 11 U/L (ref 0–35)
AST: 13 U/L (ref 0–37)
Albumin: 4.1 g/dL (ref 3.5–5.2)
Alkaline Phosphatase: 101 U/L (ref 39–117)
Bilirubin, Direct: 0.1 mg/dL (ref 0.0–0.3)
Total Bilirubin: 0.4 mg/dL (ref 0.2–1.2)
Total Protein: 7.6 g/dL (ref 6.0–8.3)

## 2019-06-26 NOTE — Telephone Encounter (Signed)
I am aware she is not a patient of your practice, but she is a Financial controller patient. Her appointment was made after talking to your Engineer, building services. I will have her scheduled at another practice for another attempt. Thank you

## 2019-06-26 NOTE — Telephone Encounter (Signed)
Pt was scheduled for labs at [facility]. She is not a patient in our office & she is a very tough stick that took at least three attempts. Our office is currently short staffed in the lab & I would recommend that she is scheduled at her primary care office, [facility], or the hospital for labs in the future.

## 2019-06-30 MED ORDER — FUROSEMIDE 20 MG PO TABS: 10.0000 mg | ORAL_TABLET | Freq: Every day | ORAL | 1 refills | Status: DC

## 2019-06-30 NOTE — Addendum Note (Signed)
Addended by: Wilfred Lacy L on: 06/30/2019 01:25 PM   Modules accepted: Orders

## 2019-06-30 NOTE — Assessment & Plan Note (Signed)
Abnormal lipid panel with elevated TC and LDL. With hx of statin intolerance. Ask if she is willing to take zetia to decrease risk for cardiovascular disease?  Lipid Panel     Component Value Date/Time   CHOL 212 (H) 06/26/2019 0914   TRIG 63.0 06/26/2019 0914   HDL 72.90 06/26/2019 0914   CHOLHDL 3 06/26/2019 0914   VLDL 12.6 06/26/2019 0914   LDLCALC 127 (H) 06/26/2019 0914   LDLDIRECT 138.9 04/07/2011 8138

## 2019-06-30 NOTE — Assessment & Plan Note (Signed)
Hepatic Function Latest Ref Rng & Units 06/26/2019 10/18/2018 02/25/2018  Total Protein 6.0 - 8.3 g/dL 7.6 7.5 7.8  Albumin 3.5 - 5.2 g/dL 4.1 - 3.1(L)  AST 0 - 37 U/L 13 - 60(H)  ALT 0 - 35 U/L 11 - 61(H)  Alk Phosphatase 39 - 117 U/L 101 - 277(H)  Total Bilirubin 0.2 - 1.2 mg/dL 0.4 - 0.7  Bilirubin, Direct 0.0 - 0.3 mg/dL 0.1 - -   normal

## 2019-07-03 ENCOUNTER — Other Ambulatory Visit: Payer: Self-pay

## 2019-07-03 ENCOUNTER — Ambulatory Visit (INDEPENDENT_AMBULATORY_CARE_PROVIDER_SITE_OTHER): Payer: Medicare Other

## 2019-07-03 DIAGNOSIS — J4540 Moderate persistent asthma, uncomplicated: Secondary | ICD-10-CM

## 2019-07-03 MED ORDER — BENRALIZUMAB 30 MG/ML ~~LOC~~ SOSY
30.0000 mg | PREFILLED_SYRINGE | Freq: Once | SUBCUTANEOUS | Status: AC
  Administered 2019-07-03: 30 mg via SUBCUTANEOUS

## 2019-07-03 NOTE — Progress Notes (Signed)
Have you been hospitalized within the last 10 days?  No Do you have a fever?  No Do you have a cough?  No Do you have a headache or sore throat? No Do you have your Epi Pen visible and is it within date?  Yes 

## 2019-07-09 DIAGNOSIS — J301 Allergic rhinitis due to pollen: Secondary | ICD-10-CM | POA: Diagnosis not present

## 2019-07-11 DIAGNOSIS — J301 Allergic rhinitis due to pollen: Secondary | ICD-10-CM | POA: Diagnosis not present

## 2019-07-16 DIAGNOSIS — J301 Allergic rhinitis due to pollen: Secondary | ICD-10-CM | POA: Diagnosis not present

## 2019-07-23 DIAGNOSIS — J301 Allergic rhinitis due to pollen: Secondary | ICD-10-CM | POA: Diagnosis not present

## 2019-07-28 DIAGNOSIS — J301 Allergic rhinitis due to pollen: Secondary | ICD-10-CM | POA: Diagnosis not present

## 2019-07-30 DIAGNOSIS — J309 Allergic rhinitis, unspecified: Secondary | ICD-10-CM | POA: Diagnosis not present

## 2019-08-05 NOTE — Progress Notes (Signed)
Virtual Visit via Video Note  I connected with patient on 08/06/19 at  2:30 PM EDT by audio enabled telemedicine application and verified that I am speaking with the correct person using two identifiers.   THIS ENCOUNTER IS A VIRTUAL VISIT DUE TO COVID-19 - PATIENT WAS NOT SEEN IN THE OFFICE. PATIENT HAS CONSENTED TO VIRTUAL VISIT / TELEMEDICINE VISIT   Location of patient: home  Location of provider: office  I discussed the limitations of evaluation and management by telemedicine and the availability of in person appointments. The patient expressed understanding and agreed to proceed.   Subjective:   Lavonia DanaDejuanna Fedorko is a 73 y.o. female who presents for Medicare Annual (Subsequent) preventive examination.  Review of Systems:   Home Safety/Smoke Alarms: Feels safe in home. Smoke alarms in place.  Lives alone w/ cat. 2 story townhouse. Pt reports her daughter checks on her regularly.    Female:      Mammo- 11/15/18       Dexa scan-  Ordered.      CCS- last reported 2009. Declines today  Objective:     Vitals: Unable to assess. This visit is enabled though telemedicine due to Covid 19.   Advanced Directives 08/06/2019 07/01/2018 02/25/2018 02/15/2018 11/07/2017 05/31/2016 03/02/2015  Does Patient Have a Medical Advance Directive? No No No No No No No  Would patient like information on creating a medical advance directive? No - Patient declined No - Patient declined No - Patient declined No - Patient declined No - Patient declined Yes - Educational materials given No - patient declined information    Tobacco Social History   Tobacco Use  Smoking Status Never Smoker  Smokeless Tobacco Never Used     Counseling given: Not Answered   Clinical Intake: Pain : No/denies pain     Past Medical History:  Diagnosis Date  . Allergy    allergic rhinitis  . Angioedema 02/2006  . Arthritis    OA of knees  . Asthma   . Complication of anesthesia    apnea- sleep  . Frequent  headaches   . Hiatal hernia   . Hypertension   . Hypothyroid   . Labile blood pressure   . Lower extremity edema   . Lung disease   . Pneumonia   . Pneumonia, eosinophilic   . Shortness of breath dyspnea   . Sleep apnea   . Urinary incontinence   . Wheezing    Past Surgical History:  Procedure Laterality Date  . BREAST BIOPSY Left 2013   NEG  . BREAST SURGERY  1966   breast biopsy  . CESAREAN SECTION     x2  . FLEXIBLE BRONCHOSCOPY N/A 09/16/2015   Procedure: FLEXIBLE BRONCHOSCOPY WITH FLURO ;  Surgeon: Stephanie AcreVishal Mungal, MD;  Location: [facility] ORS;  Service: Pulmonary;  Laterality: N/A;   Family History  Problem Relation Age of Onset  . Diabetes Mother        pre-diabetic  . Heart disease Mother        ? heart disease  . Emphysema Mother   . Thyroid disease Mother   . Diabetes Maternal Uncle   . Heart disease Father   . Breast cancer Neg Hx    Social History   Socioeconomic History  . Marital status: Single    Spouse name: Not on file  . Number of children: 2  . Years of education: college  . Highest education level: Not on file  Occupational History  . Occupation: retired  Employer: RETIRED  Social Needs  . Financial resource strain: Not on file  . Food insecurity    Worry: Not on file    Inability: Not on file  . Transportation needs    Medical: Not on file    Non-medical: Not on file  Tobacco Use  . Smoking status: Never Smoker  . Smokeless tobacco: Never Used  Substance and Sexual Activity  . Alcohol use: No    Alcohol/week: 0.0 standard drinks  . Drug use: No  . Sexual activity: Never  Lifestyle  . Physical activity    Days per week: Not on file    Minutes per session: Not on file  . Stress: Not on file  Relationships  . Social Herbalist on phone: Not on file    Gets together: Not on file    Attends religious service: Not on file    Active member of club or organization: Not on file    Attends meetings of clubs or organizations:  Not on file    Relationship status: Not on file  Other Topics Concern  . Not on file  Social History Narrative   Lives alone   Left handed    Caffeine use: 1 cup per day    Outpatient Encounter Medications as of 08/06/2019  Medication Sig  . albuterol (ACCUNEB) 0.63 MG/3ML nebulizer solution Take 3 mLs (0.63 mg total) by nebulization every 6 (six) hours as needed for wheezing or shortness of breath.  Marland Kitchen albuterol (VENTOLIN HFA) 108 (90 Base) MCG/ACT inhaler TAKE 1 TO 2 PUFFS EVERY 6 HOURS AS NEEDED  . B Complex-C-Folic Acid (STRESS B COMPLEX PO) Take 1 tablet by mouth daily.  . Calcium Carbonate-Vitamin D (CALTRATE 600+D PO) Take 1 tablet by mouth 2 (two) times daily.  . cetirizine (ZYRTEC) 10 MG tablet TAKE 1 TABLET BY MOUTH EVERY DAY  . co-enzyme Q-10 30 MG capsule Take 200 mg by mouth daily.  Marland Kitchen EPIPEN 2-PAK 0.3 MG/0.3ML SOAJ injection See admin instructions.  . fluticasone furoate-vilanterol (BREO ELLIPTA) 200-25 MCG/INH AEPB Inhale 1 puff into the lungs daily.  . Fluticasone-Salmeterol (WIXELA INHUB) 250-50 MCG/DOSE AEPB Inhale 1 puff into the lungs 2 (two) times daily.  . furosemide (LASIX) 20 MG tablet Take 0.5 tablets (10 mg total) by mouth daily.  Marland Kitchen GARLIC PO Take 1 capsule by mouth daily.  . montelukast (SINGULAIR) 10 MG tablet Take 1 tablet (10 mg total) by mouth daily.  . Multiple Vitamins-Minerals (CENTRUM ADULTS PO) Take 1 tablet by mouth daily.  . naproxen (NAPROSYN) 500 MG tablet Take by mouth.  Marland Kitchen omeprazole (PRILOSEC) 20 MG capsule Take 1 capsule (20 mg total) by mouth daily.  . ONE TOUCH ULTRA TEST test strip USE TO TEST TWICE DAILY DX E09.9  . ONETOUCH DELICA LANCETS 02H MISC USE TO TEST SUGARS TWICE DAILY DX E09.9  . SPIRIVA RESPIMAT 1.25 MCG/ACT AERS TAKE 2 PUFFS BY MOUTH TWICE A DAY  . triamcinolone ointment (KENALOG) 0.1 % APPLY TO AFFECTED AREA TWICE A DAY  . diphenhydrAMINE (BENADRYL ALLERGY) 25 MG tablet Take 1 tablet (25 mg total) by mouth every 8 (eight) hours  for 1 day, THEN 1 tablet (25 mg total) every 12 (twelve) hours for 2 days, THEN 1 tablet (25 mg total) every 8 (eight) hours as needed for up to 2 days for itching.   Facility-Administered Encounter Medications as of 08/06/2019  Medication  . omalizumab Arvid Right) injection 375 mg  . omalizumab Arvid Right) injection 375 mg  .  omalizumab Geoffry [place]) injection 375 mg    Activities of Daily Living In your present state of health, do you have any difficulty performing the following activities: 08/06/2019  Hearing? N  Vision? N  Difficulty concentrating or making decisions? N  Walking or climbing stairs? N  Dressing or bathing? N  Doing errands, shopping? N  Preparing Food and eating ? N  Using the Toilet? N  In the past six months, have you accidently leaked urine? N  Do you have problems with loss of bowel control? N  Managing your Medications? N  Managing your Finances? N  Housekeeping or managing your Housekeeping? N  Some recent data might be hidden    Patient Care Team: Nche, Bonna Gains, NP as PCP - General (Internal Medicine) Dayna Barker, MD as Referring Physician (Internal Medicine) Nche, Bonna Gains, NP as Nurse Practitioner (Internal Medicine)    Assessment:   This is a routine wellness examination for Brandi. Physical assessment deferred to PCP.   Exercise Activities and Dietary recommendations Current Exercise Habits: The patient does not participate in regular exercise at present, Exercise limited by: None identified   Diet (meal preparation, eat out, water intake, caffeinated beverages, dairy products, fruits and vegetables): 24 hr recall Breakfast: cereal  Lunch: fruit and sandwich Dinner:  [place] burgers and peas and carrots  Goals    . Increase physical activity     Starting 05/31/2016, I will continue to exercise for 30 min 3 days per week.     . Weight (lb) < 250 lb (113.4 kg)       Fall Risk Fall Risk  08/06/2019 06/24/2019 11/07/2017  10/19/2017 05/31/2016  Falls in the past year? 0 0 No No No  Number falls in past yr: 0 - - - -  Injury with Fall? 0 - - - -  Follow up Education provided;Falls prevention discussed - - - -    Depression Screen PHQ 2/9 Scores 08/06/2019 06/24/2019 11/07/2017 10/19/2017  PHQ - 2 Score 0 0 0 0  Exception Documentation - - - -     Cognitive Function Ad8 score reviewed for issues:  Issues making decisions:no  Less interest in hobbies / activities:no  Repeats questions, stories (family complaining):no  Trouble using ordinary gadgets (microwave, computer, phone):no  Forgets the month or year: no  Mismanaging finances: no  Remembering appts:no  Daily problems with thinking and/or memory:no Ad8 score is=0     MMSE - Mini Mental State Exam 11/07/2017 05/31/2016  Orientation to time 5 5  Orientation to Place 5 5  Registration 3 3  Attention/ Calculation 5 0  Recall 3 2  Recall-comments - pt was unable to recall 1 of 3 words  Language- name 2 objects 2 0  Language- repeat 1 1  Language- follow 3 step command 3 3  Language- read & follow direction 1 0  Write a sentence 1 0  Copy design 1 0  Total score 30 19        Immunization History  Administered Date(s) Administered  . Fluad Quad(high Dose 65+) 06/10/2019  . Influenza Split 07/27/2011, 08/19/2012  . Influenza Whole 08/01/2007, 07/13/2008, 07/20/2009, 07/20/2010  . Influenza, High Dose Seasonal PF 07/16/2018  . Influenza,inj,Quad PF,6+ Mos 07/29/2013, 07/15/2014, 07/09/2015, 07/14/2016, 06/19/2017  . Pneumococcal Conjugate-13 05/31/2016  . Pneumococcal Polysaccharide-23 10/09/2001, 01/13/2014  . Td 10/10/2003    Screening Tests Health Maintenance  Topic Date Due  . OPHTHALMOLOGY EXAM  05/15/2018  . COLONOSCOPY  07/20/2018  . TETANUS/TDAP  06/23/2020 (  Originally 10/09/2013)  . FOOT EXAM  11/21/2019  . HEMOGLOBIN A1C  12/24/2019  . URINE MICROALBUMIN  06/25/2020  . MAMMOGRAM  11/15/2020  . INFLUENZA VACCINE   Completed  . DEXA SCAN  Completed  . Hepatitis C Screening  Completed  . PNA vac Low Risk Adult  Completed       Plan:    Please schedule your next medicare wellness visit with me in 1 yr.  Continue to eat heart healthy diet (full of fruits, vegetables, whole grains, lean protein, water--limit salt, fat, and sugar intake) and increase physical activity as tolerated.  Continue doing brain stimulating activities (puzzles, reading, adult coloring books, staying active) to keep memory sharp.   Bring a copy of your living will and/or healthcare power of attorney to your next office visit.    I have personally reviewed and noted the following in the patient's chart:   . Medical and social history . Use of alcohol, tobacco or illicit drugs  . Current medications and supplements . Functional ability and status . Nutritional status . Physical activity . Advanced directives . List of other physicians . Hospitalizations, surgeries, and ER visits in previous 12 months . Vitals . Screenings to include cognitive, depression, and falls . Referrals and appointments  In addition, I have reviewed and discussed with patient certain preventive protocols, quality metrics, and best practice recommendations. A written personalized care plan for preventive services as well as general preventive health recommendations were provided to patient.     [place], [place]  08/06/2019

## 2019-08-06 ENCOUNTER — Other Ambulatory Visit: Payer: Self-pay

## 2019-08-06 ENCOUNTER — Other Ambulatory Visit: Payer: Self-pay | Admitting: Internal Medicine

## 2019-08-06 ENCOUNTER — Ambulatory Visit (INDEPENDENT_AMBULATORY_CARE_PROVIDER_SITE_OTHER): Payer: Medicare Other | Admitting: *Deleted

## 2019-08-06 ENCOUNTER — Encounter: Payer: Self-pay | Admitting: *Deleted

## 2019-08-06 VITALS — BP 135/81 | Wt 249.0 lb

## 2019-08-06 DIAGNOSIS — Z780 Asymptomatic menopausal state: Secondary | ICD-10-CM

## 2019-08-06 DIAGNOSIS — Z0000 Encounter for general adult medical examination without abnormal findings: Secondary | ICD-10-CM | POA: Diagnosis not present

## 2019-08-06 DIAGNOSIS — J301 Allergic rhinitis due to pollen: Secondary | ICD-10-CM | POA: Diagnosis not present

## 2019-08-06 NOTE — Patient Instructions (Signed)
Please schedule your next medicare wellness visit with me in 1 yr.  Continue to eat heart healthy diet (full of fruits, vegetables, whole grains, lean protein, water--limit salt, fat, and sugar intake) and increase physical activity as tolerated.  Continue doing brain stimulating activities (puzzles, reading, adult coloring books, staying active) to keep memory sharp.   Bring a copy of your living will and/or healthcare power of attorney to your next office visit.   Rachael Silva , Thank you for taking time to come for your Medicare Wellness Visit. I appreciate your ongoing commitment to your health goals. Please review the following plan we discussed and let me know if I can assist you in the future.   These are the goals we discussed: Goals    . Increase physical activity     Starting 05/31/2016, I will continue to exercise for 30 min 3 days per week.     . Weight (lb) < 250 lb (113.4 kg)       This is a list of the screening recommended for you and due dates:  Health Maintenance  Topic Date Due  . Eye exam for diabetics  05/15/2018  . Colon Cancer Screening  07/20/2018  . Tetanus Vaccine  06/23/2020*  . Complete foot exam   11/21/2019  . Hemoglobin A1C  12/24/2019  . Urine Protein Check  06/25/2020  . Mammogram  11/15/2020  . Flu Shot  Completed  . DEXA scan (bone density measurement)  Completed  .  Hepatitis C: One time screening is recommended by Center for Disease Control  (CDC) for  adults born from [birth year] through 1963.   Completed  . Pneumonia vaccines  Completed  *Topic was postponed. The date shown is not the original due date.    Preventive Care 31 Years and Older, Female Preventive care refers to lifestyle choices and visits with your health care provider that can promote health and wellness. This includes:  A yearly physical exam. This is also called an annual well check.  Regular dental and eye exams.  Immunizations.  Screening for certain conditions.  Healthy  lifestyle choices, such as diet and exercise. What can I expect for my preventive care visit? Physical exam Your health care provider will check:  Height and weight. These may be used to calculate body mass index (BMI), which is a measurement that tells if you are at a healthy weight.  Heart rate and blood pressure.  Your skin for abnormal spots. Counseling Your health care provider may ask you questions about:  Alcohol, tobacco, and drug use.  Emotional well-being.  Home and relationship well-being.  Sexual activity.  Eating habits.  History of falls.  Memory and ability to understand (cognition).  Work and work Statistician.  Pregnancy and menstrual history. What immunizations do I need?  Influenza (flu) vaccine  This is recommended every year. Tetanus, diphtheria, and pertussis (Tdap) vaccine  You may need a Td booster every 10 years. Varicella (chickenpox) vaccine  You may need this vaccine if you have not already been vaccinated. Zoster (shingles) vaccine  You may need this after age 24. Pneumococcal conjugate (PCV13) vaccine  One dose is recommended after age 62. Pneumococcal polysaccharide (PPSV23) vaccine  One dose is recommended after age 84. Measles, mumps, and rubella (MMR) vaccine  You may need at least one dose of MMR if you were born in 1957 or later. You may also need a second dose. Meningococcal conjugate (MenACWY) vaccine  You may need this if you have  certain conditions. Hepatitis A vaccine  You may need this if you have certain conditions or if you travel or work in places where you may be exposed to hepatitis A. Hepatitis B vaccine  You may need this if you have certain conditions or if you travel or work in places where you may be exposed to hepatitis B. Haemophilus influenzae type b (Hib) vaccine  You may need this if you have certain conditions. You may receive vaccines as individual doses or as more than one vaccine together in  one shot (combination vaccines). Talk with your health care provider about the risks and benefits of combination vaccines. What tests do I need? Blood tests  Lipid and cholesterol levels. These may be checked every 5 years, or more frequently depending on your overall health.  Hepatitis C test.  Hepatitis B test. Screening  Lung cancer screening. You may have this screening every year starting at age 2 if you have a 30-pack-year history of smoking and currently smoke or have quit within the past 15 years.  Colorectal cancer screening. All adults should have this screening starting at age 76 and continuing until age 35. Your health care provider may recommend screening at age 23 if you are at increased risk. You will have tests every 1-10 years, depending on your results and the type of screening test.  Diabetes screening. This is done by checking your blood sugar (glucose) after you have not eaten for a while (fasting). You may have this done every 1-3 years.  Mammogram. This may be done every 1-2 years. Talk with your health care provider about how often you should have regular mammograms.  BRCA-related cancer screening. This may be done if you have a family history of breast, ovarian, tubal, or peritoneal cancers. Other tests  Sexually transmitted disease (STD) testing.  Bone density scan. This is done to screen for osteoporosis. You may have this done starting at age 74. Follow these instructions at home: Eating and drinking  Eat a diet that includes fresh fruits and vegetables, whole grains, lean protein, and low-fat dairy products. Limit your intake of foods with high amounts of sugar, saturated fats, and salt.  Take vitamin and mineral supplements as recommended by your health care provider.  Do not drink alcohol if your health care provider tells you not to drink.  If you drink alcohol: ? Limit how much you have to 0-1 drink a day. ? Be aware of how much alcohol is in your  drink. In the [place] one drink equals one 12 oz bottle of beer (355 mL), one 5 oz glass of wine (148 mL), or one 1 oz glass of hard liquor (44 mL). Lifestyle  Take daily care of your teeth and gums.  Stay active. Exercise for at least 30 minutes on 5 or more days each week.  Do not use any products that contain nicotine or tobacco, such as cigarettes, e-cigarettes, and chewing tobacco. If you need help quitting, ask your health care provider.  If you are sexually active, practice safe sex. Use a condom or other form of protection in order to prevent STIs (sexually transmitted infections).  Talk with your health care provider about taking a low-dose aspirin or statin. What's next?  Go to your health care provider once a year for a well check visit.  Ask your health care provider how often you should have your eyes and teeth checked.  Stay up to date on all vaccines. This information is not intended  to replace advice given to you by your health care provider. Make sure you discuss any questions you have with your health care provider. Document Released: 10/22/2015 Document Revised: 09/19/2018 Document Reviewed: 09/19/2018 Elsevier Patient Education  2020 [employer].

## 2019-08-13 DIAGNOSIS — J301 Allergic rhinitis due to pollen: Secondary | ICD-10-CM | POA: Diagnosis not present

## 2019-08-18 ENCOUNTER — Telehealth: Payer: Self-pay | Admitting: Internal Medicine

## 2019-08-18 NOTE — Telephone Encounter (Signed)
Fasenra Order: 30mg #1 prefilled syringe Ordered date: 08/18/2019 Expected date of arrival: 08/19/2019 Ordered by: Kamarie Veno, CMA  Specialty Pharmacy: [facility]  

## 2019-08-19 NOTE — Telephone Encounter (Signed)
Fasenra Shipment Received:  30mg #1 prefilled syringe Medication arrival date: 08/19/2019 Lot #: [id number] Exp date: 09/2020 Received by: Kyndall Chaplin, CMA   

## 2019-08-20 DIAGNOSIS — J301 Allergic rhinitis due to pollen: Secondary | ICD-10-CM | POA: Diagnosis not present

## 2019-08-28 ENCOUNTER — Ambulatory Visit (INDEPENDENT_AMBULATORY_CARE_PROVIDER_SITE_OTHER): Payer: Medicare Other

## 2019-08-28 ENCOUNTER — Other Ambulatory Visit: Payer: Self-pay

## 2019-08-28 DIAGNOSIS — J4540 Moderate persistent asthma, uncomplicated: Secondary | ICD-10-CM | POA: Diagnosis not present

## 2019-08-28 MED ORDER — BENRALIZUMAB 30 MG/ML ~~LOC~~ SOSY
30.0000 mg | PREFILLED_SYRINGE | Freq: Once | SUBCUTANEOUS | Status: AC
  Administered 2019-08-28: 30 mg via SUBCUTANEOUS

## 2019-08-28 NOTE — Progress Notes (Signed)
Have you been hospitalized within the last 10 days?  No Do you have a fever?  No Do you have a cough?  No Do you have a headache or sore throat? No Do you have your Epi Pen visible and is it within date?  Yes 

## 2019-09-03 DIAGNOSIS — J301 Allergic rhinitis due to pollen: Secondary | ICD-10-CM | POA: Diagnosis not present

## 2019-09-08 ENCOUNTER — Ambulatory Visit
Admission: RE | Admit: 2019-09-08 | Discharge: 2019-09-08 | Disposition: A | Discharge status: Home / Self Care | Payer: Medicare Other | Source: Ambulatory Visit | Attending: Nurse Practitioner | Admitting: Nurse Practitioner

## 2019-09-08 DIAGNOSIS — Z780 Asymptomatic menopausal state: Secondary | ICD-10-CM | POA: Insufficient documentation

## 2019-09-10 DIAGNOSIS — J301 Allergic rhinitis due to pollen: Secondary | ICD-10-CM | POA: Diagnosis not present

## 2019-09-17 DIAGNOSIS — J301 Allergic rhinitis due to pollen: Secondary | ICD-10-CM | POA: Diagnosis not present

## 2019-10-13 ENCOUNTER — Telehealth: Payer: Self-pay | Admitting: Internal Medicine

## 2019-10-13 NOTE — Telephone Encounter (Signed)
Fasenra Order: 30mg #1 prefilled syringe Ordered date: 10/13/2019 Expected date of arrival: 10/14/2019 Ordered by: Lindsay Lemons, CMA  Specialty Pharmacy: [facility]   

## 2019-10-14 NOTE — Telephone Encounter (Signed)
Fasenra Shipment Received:  30mg #1 prefilled syringe Medication arrival date: 10/14/2019 Lot #: [id number] Exp date: 10/2020 Received by: Ayren Zumbro, CMA   

## 2019-10-23 ENCOUNTER — Other Ambulatory Visit: Payer: Self-pay

## 2019-10-23 ENCOUNTER — Ambulatory Visit (INDEPENDENT_AMBULATORY_CARE_PROVIDER_SITE_OTHER): Payer: Medicare Other

## 2019-10-23 DIAGNOSIS — J4540 Moderate persistent asthma, uncomplicated: Secondary | ICD-10-CM

## 2019-10-23 MED ORDER — BENRALIZUMAB 30 MG/ML ~~LOC~~ SOSY
30.0000 mg | PREFILLED_SYRINGE | Freq: Once | SUBCUTANEOUS | Status: AC
  Administered 2019-10-23: 15:00:00 30 mg via SUBCUTANEOUS

## 2019-10-23 NOTE — Progress Notes (Signed)
Have you been hospitalized within the last 10 days?  No Do you have a fever?  No Do you have a cough?  No Do you have a headache or sore throat? No Do you have your Epi Pen visible and is it within date?  Yes 

## 2019-10-29 DIAGNOSIS — J301 Allergic rhinitis due to pollen: Secondary | ICD-10-CM | POA: Diagnosis not present

## 2019-10-30 ENCOUNTER — Encounter: Payer: Self-pay | Admitting: Internal Medicine

## 2019-10-30 ENCOUNTER — Ambulatory Visit (INDEPENDENT_AMBULATORY_CARE_PROVIDER_SITE_OTHER): Payer: Medicare Other | Admitting: Internal Medicine

## 2019-10-30 DIAGNOSIS — J45909 Unspecified asthma, uncomplicated: Secondary | ICD-10-CM

## 2019-10-30 DIAGNOSIS — G4733 Obstructive sleep apnea (adult) (pediatric): Secondary | ICD-10-CM | POA: Diagnosis not present

## 2019-10-30 NOTE — Progress Notes (Signed)
MRN# [id number] Rachael Silva 11-Feb-1946    I connected with the patient by telephone enabled telemedicine visit and verified that I am speaking with the correct person using two identifiers.    I discussed the limitations, risks, security and privacy concerns of performing an evaluation and management service by telemedicine and the availability of in-person appointments. I also discussed with the patient that there may be a patient responsible charge related to this service. The patient expressed understanding and agreed to proceed.  PATIENT AGREES AND CONFIRMS -YES   Other persons participating in the visit and their role in the encounter: Patient, nursing  This visit type was conducted due to national recommendations for restrictions regarding the COVID-19 Pandemic (e.g. social distancing).  This format is felt to be most appropriate for this patient at this time.  All issues noted in this document were discussed and addressed.      synopsis: 74 year old female past medical history of asthma. Being followed by pulmonary for chronic eosinophilic pneumonia, status post bronchoscopy in December 2016 with negative cultures for AFB and fungus. Transbronchial biopsy-positive for eosinophilic infiltrate. Treated with steroids unable to wean since  October 2015.    CC: Follow-up asthma Follow-up OSA   HPI Asthma seems to be well-controlled at this time History of ABPA previous history of chronic prednisone therapy well controlled now with Harrington Challenger immunotherapy Currently on Breo 2 puffs twice daily Continue Spiriva as prescribed  Has intermittent wheezing Still surrounded by one cat  No exacerbation at this time No evidence of heart failure at this time No evidence or signs of infection at this time No respiratory distress No fevers, chills, nausea, vomiting, diarrhea No evidence of lower extremity edema No evidence hemoptysis   Chronic shortness of breath and dyspnea exertion  seems to be stable  Compliance report reviewed in detail with the patient And he 3% compliance for days 90% compliance for greater than 4 hours AHI is down to 0.5 Patient is on auto CPAP 11 to 15 cm of water pressure Patient is doing excellent with her compliance     Review of Systems:  Gen:  Denies  fever, sweats, chills weight loss  HEENT: Denies blurred vision, double vision, ear pain, eye pain, hearing loss, nose bleeds, sore throat Cardiac:  No dizziness, chest pain or heaviness, chest tightness,edema, No JVD Resp:   No cough, -sputum production, -shortness of breath,-wheezing, -hemoptysis,  Gi: Denies swallowing difficulty, stomach pain, nausea or vomiting, diarrhea, constipation, bowel incontinence Gu:  Denies bladder incontinence, burning urine Ext:   Denies Joint pain, stiffness or swelling Skin: Denies  skin rash, easy bruising or bleeding or hives Endoc:  Denies polyuria, polydipsia , polyphagia or weight change Psych:   Denies depression, insomnia or hallucinations  Other:  All other systems negative      Allergies:  Lisinopril, Atorvastatin, and Erythromycin           LABS Results for Rachael Silva (MRN [id number]) as of 08/01/2016 11:16  Ref. Range 07/02/2014 13:00 08/05/2015 15:12 03/30/2016 15:11  IgE (Immunoglobulin E), Serum Latest Ref Range: 0 - 100 IU/mL 2,401 (H) 1,253 (H) 1,180 (H)   Results for Rachael Silva (MRN [id number]) as of 08/01/2016 11:16  Ref. Range 08/05/2015 15:12 09/16/2015 12:46 03/30/2016 15:11 05/03/2016 08:42 07/26/2016 15:31  Eosinophil Latest Units: % 10 11 5  5.4 (H) 3        Assessment and Plan:   74 year old pleasant African-American female follow-up today for history of severe persistent asthma and underlying sleep  apnea, history of chronic eosinophilic pneumonia and ABPA  At this time her asthma is moderately persistent But is well controlled with immunological therapy with Berna Bue She is also on inhaled  steroids Continue Spiriva Respimat Albuterol as needed Patient advised to avoid allergens Avoid cats   Diagnosis of sleep apnea Continue CPAP therapy as prescribed Patient uses and benefits from auto CPAP therapy Patient has excellent compliance report Continue auto CPAP as prescribed   Allergic rhinitis Continue antihistamines as prescribed Continue Singulair and Flonase  History of ABPA No indication for prednisone at this time Bronchoscopy with BAL in 2016 was consistent with eosinophilic pneumonia   Recommend Covid vaccination  [id number] EDUCATION: The signs and symptoms of COVID-19 were discussed with the patient and how to seek care for testing.  The importance of social distancing was discussed today. Hand Washing Techniques and avoid touching face was advised.  MEDICATION ADJUSTMENTS/LABS AND TESTS ORDERED: Continue inhalers as prescribed Continue CPAP as prescribed   avoid allergens and cats    CURRENT MEDICATIONS REVIEWED AT LENGTH WITH PATIENT TODAY   Patient  satisfied with Plan of action and management. All questions answered Follow-up in 6 months   Rachael Silva, M.D.  [facility]  Medical Director [place] Director [facility] Cardio-Pulmonary Department

## 2019-10-30 NOTE — Patient Instructions (Signed)
MEDICATION ADJUSTMENTS/LABS AND TESTS ORDERED: Continue inhalers as prescribed Continue CPAP as prescribed   avoid allergens and cats

## 2019-11-05 ENCOUNTER — Other Ambulatory Visit: Payer: Self-pay | Admitting: Internal Medicine

## 2019-11-05 DIAGNOSIS — J301 Allergic rhinitis due to pollen: Secondary | ICD-10-CM | POA: Diagnosis not present

## 2019-11-05 MED ORDER — ALBUTEROL SULFATE HFA 108 (90 BASE) MCG/ACT IN AERS: 2.0000 | INHALATION_SPRAY | Freq: Four times a day (QID) | RESPIRATORY_TRACT | 2 refills | Status: DC | PRN

## 2019-11-05 NOTE — Telephone Encounter (Signed)
Rx for proair has been sent to preferred pharmacy.  Pt is aware and voiced her understanding. Nothing further is needed.  

## 2019-11-05 NOTE — Telephone Encounter (Signed)
Pt needs a med refill of her albuterol inhaler. She said that she was given a pale blue one and prefers the shorter red one, she doesn't know the brand name of it, but she said she gets a better response from the red one. Preferred pharmacy is [facility] on [address] in [place].

## 2019-11-12 DIAGNOSIS — J301 Allergic rhinitis due to pollen: Secondary | ICD-10-CM | POA: Diagnosis not present

## 2019-11-19 DIAGNOSIS — J301 Allergic rhinitis due to pollen: Secondary | ICD-10-CM | POA: Diagnosis not present

## 2019-11-30 ENCOUNTER — Telehealth: Payer: Self-pay | Admitting: Nurse Practitioner

## 2019-11-30 NOTE — Telephone Encounter (Signed)
Left VM for pt to CB. Message left on cell, could not connect to number provided; "Call can not be completed at this time."

## 2019-11-30 NOTE — Telephone Encounter (Signed)
Pt has a tooth pulled and she has one day of abx left and would like to know if she can still get her covid vaccine tomorrow

## 2019-11-30 NOTE — Telephone Encounter (Signed)
Pt had question regarding antibiotics and covid vaccine. Answered to pt's satisfaction.

## 2019-12-01 ENCOUNTER — Ambulatory Visit: Payer: Medicare Other | Attending: Internal Medicine

## 2019-12-01 ENCOUNTER — Other Ambulatory Visit: Payer: Self-pay

## 2019-12-01 DIAGNOSIS — Z23 Encounter for immunization: Secondary | ICD-10-CM

## 2019-12-01 NOTE — Progress Notes (Signed)
   Covid-19 Vaccination Clinic  Name:  Rachael Silva    MRN: [id number] DOB: 1946/02/20  12/01/2019  Rachael Silva was observed post Covid-19 immunization for 15 minutes without incidence. She was provided with Vaccine Information Sheet and instruction to access the V-Safe system.   Rachael Silva was instructed to call 911 with any severe reactions post vaccine: Marland Kitchen Difficulty breathing  . Swelling of your face and throat  . A fast heartbeat  . A bad rash all over your body  . Dizziness and weakness    Immunizations Administered    Name Date Dose VIS Date Route   Pfizer COVID-19 Vaccine 12/01/2019 10:15 AM 0.3 mL 09/19/2019 Intramuscular   Manufacturer: [employer], [employer]   Lot: [id number]   NDC: [address]-2

## 2019-12-03 DIAGNOSIS — J301 Allergic rhinitis due to pollen: Secondary | ICD-10-CM | POA: Diagnosis not present

## 2019-12-08 ENCOUNTER — Telehealth: Payer: Self-pay | Admitting: Internal Medicine

## 2019-12-08 NOTE — Telephone Encounter (Signed)
Fasenra Order: 30mg #1 prefilled syringe Ordered date: 12/08/2019 Expected date of arrival: 12/09/2019 Ordered by: Elyse Prevo, CMA  Specialty Pharmacy: [facility]  

## 2019-12-09 NOTE — Telephone Encounter (Signed)
Fasenra Shipment Received:  30mg #1 prefilled syringe Medication arrival date: 12/09/2019 Lot #: [id number] Exp date: 01/2021 Received by: Kaylani Fromme, CMA   

## 2019-12-10 DIAGNOSIS — J301 Allergic rhinitis due to pollen: Secondary | ICD-10-CM | POA: Diagnosis not present

## 2019-12-15 DIAGNOSIS — J301 Allergic rhinitis due to pollen: Secondary | ICD-10-CM | POA: Diagnosis not present

## 2019-12-18 ENCOUNTER — Other Ambulatory Visit: Payer: Self-pay

## 2019-12-18 ENCOUNTER — Ambulatory Visit (INDEPENDENT_AMBULATORY_CARE_PROVIDER_SITE_OTHER): Payer: Medicare Other

## 2019-12-18 DIAGNOSIS — J45909 Unspecified asthma, uncomplicated: Secondary | ICD-10-CM

## 2019-12-18 MED ORDER — BENRALIZUMAB 30 MG/ML ~~LOC~~ SOSY
30.0000 mg | PREFILLED_SYRINGE | Freq: Once | SUBCUTANEOUS | Status: AC
  Administered 2019-12-18: 15:00:00 30 mg via SUBCUTANEOUS

## 2019-12-18 NOTE — Progress Notes (Signed)
All questions were answered by the patient before medication was administered. Have you been hospitalized in the last 10 days? No Do you have a fever? No Do you have a cough? No Do you have a headache or sore throat? No  

## 2019-12-19 DIAGNOSIS — J301 Allergic rhinitis due to pollen: Secondary | ICD-10-CM | POA: Diagnosis not present

## 2019-12-22 ENCOUNTER — Other Ambulatory Visit: Payer: Self-pay

## 2019-12-22 DIAGNOSIS — J301 Allergic rhinitis due to pollen: Secondary | ICD-10-CM | POA: Diagnosis not present

## 2019-12-23 ENCOUNTER — Encounter: Payer: Self-pay | Admitting: Nurse Practitioner

## 2019-12-23 ENCOUNTER — Ambulatory Visit (INDEPENDENT_AMBULATORY_CARE_PROVIDER_SITE_OTHER): Payer: Medicare Other | Admitting: Nurse Practitioner

## 2019-12-23 ENCOUNTER — Other Ambulatory Visit (INDEPENDENT_AMBULATORY_CARE_PROVIDER_SITE_OTHER): Payer: Medicare Other

## 2019-12-23 VITALS — BP 126/84 | HR 84 | Temp 96.1°F | Ht 67.0 in | Wt 258.8 lb

## 2019-12-23 DIAGNOSIS — I10 Essential (primary) hypertension: Secondary | ICD-10-CM

## 2019-12-23 DIAGNOSIS — E079 Disorder of thyroid, unspecified: Secondary | ICD-10-CM

## 2019-12-23 DIAGNOSIS — T380X5D Adverse effect of glucocorticoids and synthetic analogues, subsequent encounter: Secondary | ICD-10-CM

## 2019-12-23 DIAGNOSIS — E099 Drug or chemical induced diabetes mellitus without complications: Secondary | ICD-10-CM | POA: Diagnosis not present

## 2019-12-23 DIAGNOSIS — G629 Polyneuropathy, unspecified: Secondary | ICD-10-CM | POA: Diagnosis not present

## 2019-12-23 DIAGNOSIS — E039 Hypothyroidism, unspecified: Secondary | ICD-10-CM | POA: Diagnosis not present

## 2019-12-23 LAB — BASIC METABOLIC PANEL
BUN: 15 mg/dL (ref 6–23)
CO2: 29 mEq/L (ref 19–32)
Calcium: 9.5 mg/dL (ref 8.4–10.5)
Chloride: 105 mEq/L (ref 96–112)
Creatinine, Ser: 0.94 mg/dL (ref 0.40–1.20)
GFR: 70.85 mL/min (ref >60.00)
Glucose, Bld: 92 mg/dL (ref 70–99)
Potassium: 4.2 mEq/L (ref 3.5–5.1)
Sodium: 140 mEq/L (ref 135–145)

## 2019-12-23 LAB — CBC WITH DIFFERENTIAL/PLATELET
Basophils Absolute: 0 10*3/uL (ref 0.0–0.1)
Basophils Relative: 0.6 % (ref 0.0–3.0)
Eosinophils Absolute: 0 10*3/uL (ref 0.0–0.7)
Eosinophils Relative: 0 % (ref 0.0–5.0)
HCT: 42.7 % (ref 36.0–46.0)
Hemoglobin: 14.4 g/dL (ref 12.0–15.0)
Lymphocytes Relative: 54.1 % — ABNORMAL HIGH (ref 12.0–46.0)
Lymphs Abs: 3.6 10*3/uL (ref 0.7–4.0)
MCHC: 33.6 g/dL (ref 30.0–36.0)
MCV: 88.6 fl (ref 78.0–100.0)
Monocytes Absolute: 0.5 10*3/uL (ref 0.1–1.0)
Monocytes Relative: 7.8 % (ref 3.0–12.0)
Neutro Abs: 2.5 10*3/uL (ref 1.4–7.7)
Neutrophils Relative %: 37.5 % — ABNORMAL LOW (ref 43.0–77.0)
Platelets: 202 10*3/uL (ref 150.0–400.0)
RBC: 4.82 Mil/uL (ref 3.87–5.11)
RDW: 13.4 % (ref 11.5–15.5)
WBC: 6.7 10*3/uL (ref 4.0–10.5)

## 2019-12-23 LAB — HEMOGLOBIN A1C: Hgb A1c MFr Bld: 6.1 % (ref 4.6–6.5)

## 2019-12-23 LAB — T3, FREE: T3, Free: 3.2 pg/mL (ref 2.3–4.2)

## 2019-12-23 LAB — TSH: TSH: 3.36 u[IU]/mL (ref 0.35–4.50)

## 2019-12-23 LAB — T4, FREE: Free T4: 1.19 ng/dL (ref 0.60–1.60)

## 2019-12-23 MED ORDER — PREGABALIN 25 MG PO CAPS: ORAL_CAPSULE | ORAL | 5 refills | Status: DC

## 2019-12-23 NOTE — Patient Instructions (Signed)
Start lyrica as directed for neuropathy.  Go to [address] for blood draw.

## 2019-12-23 NOTE — Progress Notes (Signed)
Subjective:  Patient ID: Rachael Silva, female    DOB: August 16, 1946  Age: 74 y.o. MRN: [id number]  CC: Follow-up (6 mo follow up for DM, BP-pt is fasting/pt reports last BP reading 130/87...//pt states blood sugar stable but no readings//no recent colonoscopy or eye exam due to covid)  HPI DM, Hyperlipidemia, and Neuropathy: Controlled with diet BP at goal Needs referral to ophthalmology. Reports persistent neuropathic pain, unable to tolerate Cymbalta and gabapentin. Evaluated by neurology, EMG completed. LDL not at goal, unable to tolerate statin even at low dose(myalgia) BP Readings from Last 3 Encounters:  12/23/19 126/84  08/06/19 135/81  06/24/19 140/64   Reviewed past Medical, Social and Family history today.  Outpatient Medications Prior to Visit  Medication Sig Dispense Refill  . albuterol (PROAIR HFA) 108 (90 Base) MCG/ACT inhaler Inhale 2 puffs into the lungs every 6 (six) hours as needed for wheezing or shortness of breath. 18 g 2  . B Complex-C-Folic Acid (STRESS B COMPLEX PO) Take 1 tablet by mouth daily.    . Calcium Carbonate-Vitamin D (CALTRATE 600+D PO) Take 1 tablet by mouth 2 (two) times daily.    . cetirizine (ZYRTEC) 10 MG tablet TAKE 1 TABLET BY MOUTH EVERY DAY 90 tablet 2  . co-enzyme Q-10 30 MG capsule Take 200 mg by mouth daily.    . diphenhydrAMINE (BENADRYL ALLERGY) 25 MG tablet Take 1 tablet (25 mg total) by mouth every 8 (eight) hours for 1 day, THEN 1 tablet (25 mg total) every 12 (twelve) hours for 2 days, THEN 1 tablet (25 mg total) every 8 (eight) hours as needed for up to 2 days for itching. 30 tablet 0  . EPIPEN 2-PAK 0.3 MG/0.3ML SOAJ injection See admin instructions.  99  . fluticasone furoate-vilanterol (BREO ELLIPTA) 200-25 MCG/INH AEPB Inhale 1 puff into the lungs daily. 1 each 0  . Fluticasone-Salmeterol (WIXELA INHUB) 250-50 MCG/DOSE AEPB Inhale 1 puff into the lungs 2 (two) times daily. 60 each 5  . GARLIC PO Take 1 capsule by mouth daily.      . montelukast (SINGULAIR) 10 MG tablet Take 1 tablet (10 mg total) by mouth daily. 90 tablet 2  . Multiple Vitamins-Minerals (CENTRUM ADULTS PO) Take 1 tablet by mouth daily.    . naproxen (NAPROSYN) 500 MG tablet Take by mouth.    Marland Kitchen omeprazole (PRILOSEC) 20 MG capsule Take 1 capsule (20 mg total) by mouth daily.    . ONE TOUCH ULTRA TEST test strip USE TO TEST TWICE DAILY DX E09.9 100 each 0  . ONETOUCH DELICA LANCETS 33G MISC USE TO TEST SUGARS TWICE DAILY DX E09.9 100 each 0  . SPIRIVA RESPIMAT 1.25 MCG/ACT AERS TAKE 2 PUFFS BY MOUTH TWICE A DAY 4 g 1  . triamcinolone ointment (KENALOG) 0.1 % APPLY TO AFFECTED AREA TWICE A DAY 80 g 0  . furosemide (LASIX) 20 MG tablet Take 0.5 tablets (10 mg total) by mouth daily. 45 tablet 1  . albuterol (ACCUNEB) 0.63 MG/3ML nebulizer solution Take 3 mLs (0.63 mg total) by nebulization every 6 (six) hours as needed for wheezing or shortness of breath. (Patient not taking: Reported on 12/23/2019) 75 mL 0   Facility-Administered Medications Prior to Visit  Medication Dose Route Frequency Provider Last Rate Last Admin  . omalizumab Geoffry Paradise) injection 375 mg  375 mg Subcutaneous Q14 Days Erin Fulling, MD   375 mg at 08/16/17 1249  . omalizumab Geoffry Paradise) injection 375 mg  375 mg Subcutaneous Q14 Days Kasa, Kurian,  MD   375 mg at 09/25/17 1638  . omalizumab Arvid Right) injection 375 mg  375 mg Subcutaneous Q14 Days Flora Lipps, MD   375 mg at 10/11/17 1625    ROS See HPI  Objective:  BP 126/84   Pulse 84   Temp (!) 96.1 F (35.6 C) (Tympanic)   Ht 5\' 7"  (1.702 m)   Wt 258 lb 12.8 oz (117.4 kg)   SpO2 97%   BMI 40.53 kg/m   BP Readings from Last 3 Encounters:  12/23/19 126/84  08/06/19 135/81  06/24/19 140/64    Wt Readings from Last 3 Encounters:  12/23/19 258 lb 12.8 oz (117.4 kg)  08/06/19 249 lb (112.9 kg)  06/24/19 250 lb 3.2 oz (113.5 kg)    Physical Exam Cardiovascular:     Rate and Rhythm: Normal rate and regular rhythm.     Pulses:  Normal pulses.     Heart sounds: Normal heart sounds.  Pulmonary:     Effort: Pulmonary effort is normal.     Breath sounds: Normal breath sounds.  Musculoskeletal:     Cervical back: Normal range of motion and neck supple.     Right lower leg: No edema.     Left lower leg: No edema.  Skin:    Comments: Normal diabetic foot exam  Neurological:     Mental Status: She is alert and oriented to person, place, and time.  Psychiatric:        Mood and Affect: Mood normal.        Behavior: Behavior normal.        Thought Content: Thought content normal.    Lab Results  Component Value Date   WBC 6.7 12/23/2019   HGB 14.4 12/23/2019   HCT 42.7 12/23/2019   PLT 202.0 12/23/2019   GLUCOSE 92 12/23/2019   CHOL 212 (H) 06/26/2019   TRIG 63.0 06/26/2019   HDL 72.90 06/26/2019   LDLDIRECT 138.9 04/07/2011   LDLCALC 127 (H) 06/26/2019   ALT 11 06/26/2019   AST 13 06/26/2019   NA 140 12/23/2019   K 4.2 12/23/2019   CL 105 12/23/2019   CREATININE 0.94 12/23/2019   BUN 15 12/23/2019   CO2 29 12/23/2019   TSH 3.36 12/23/2019   HGBA1C 6.1 12/23/2019   MICROALBUR <0.7 06/26/2019     Assessment & Plan:  This visit occurred during the SARS-CoV-2 public health emergency.  Safety protocols were in place, including screening questions prior to the visit, additional usage of staff PPE, and extensive cleaning of exam room while observing appropriate contact time as indicated for disinfecting solutions.   [name] was seen today for follow-up.  Diagnoses and all orders for this visit:  Hypothyroidism, unspecified type  Essential hypertension -     Basic metabolic panel; Future -     CBC w/Diff; Future -     Ambulatory referral to Ophthalmology -     furosemide (LASIX) 20 MG tablet; Take 0.5 tablets (10 mg total) by mouth daily.  Steroid-induced diabetes mellitus, subsequent encounter ([place]) -     Hemoglobin A1c; Future -     Ambulatory referral to Ophthalmology  Peripheral  polyneuropathy -     pregabalin (LYRICA) 25 MG capsule; I cap at hs x 2weeks, then 1cap in AM and PM continuously  Thyroid dysfunction -     TSH; Future -     T4, free; Future -     T3, free; Future   I am having [name] start  on pregabalin. I am also having her maintain her Multiple Vitamins-Minerals (CENTRUM ADULTS PO), Calcium Carbonate-Vitamin D (CALTRATE 600+D PO), co-enzyme Q-10, B Complex-C-Folic Acid (STRESS B COMPLEX PO), GARLIC PO, EpiPen 2-Pak, OneTouch Delica Lancets 33G, ONE TOUCH ULTRA TEST, diphenhydrAMINE, triamcinolone ointment, omeprazole, Fluticasone-Salmeterol, montelukast, fluticasone furoate-vilanterol, naproxen, cetirizine, Spiriva Respimat, albuterol, and furosemide. We will continue to administer omalizumab, omalizumab, and omalizumab.  Meds ordered this encounter  Medications  . pregabalin (LYRICA) 25 MG capsule    Sig: I cap at hs x 2weeks, then 1cap in AM and PM continuously    Dispense:  30 capsule    Refill:  5    Order Specific Question:   Supervising Provider    Answer:   Overton Mam [[id number]]  . furosemide (LASIX) 20 MG tablet    Sig: Take 0.5 tablets (10 mg total) by mouth daily.    Dispense:  45 tablet    Refill:  1    Order Specific Question:   Supervising Provider    Answer:   Overton Mam [[id number]]    Problem List Items Addressed This Visit      Cardiovascular and Mediastinum   Essential hypertension   Relevant Medications   furosemide (LASIX) 20 MG tablet   Other Relevant Orders   Basic metabolic panel (Completed)   CBC w/Diff (Completed)   Ambulatory referral to Ophthalmology     Endocrine   Steroid-induced diabetes mellitus (HCC)   Relevant Orders   Hemoglobin A1c (Completed)   Ambulatory referral to Ophthalmology     Nervous and Auditory   Peripheral polyneuropathy    Will try lyrica. New rx sent F/up in 11month      Relevant Medications   pregabalin (LYRICA) 25 MG capsule    Other Visit Diagnoses     Hypothyroidism, unspecified type    -  Primary   Thyroid dysfunction       Relevant Orders   TSH (Completed)   T4, free (Completed)   T3, free (Completed)      Follow-up: Return in about 4 weeks (around 01/20/2020) for neuropathy (video, ).  Alysia Penna, NP

## 2019-12-24 ENCOUNTER — Ambulatory Visit: Payer: Medicare Other | Attending: Internal Medicine

## 2019-12-24 DIAGNOSIS — Z23 Encounter for immunization: Secondary | ICD-10-CM

## 2019-12-24 NOTE — Progress Notes (Signed)
   Covid-19 Vaccination Clinic  Name:  Rachael Silva    MRN: [id number] DOB: [birth year]  12/24/2019  [name] was observed post Covid-19 immunization for 15 minutes without incident. She was provided with Vaccine Information Sheet and instruction to access the V-Safe system.   [name] was instructed to call 911 with any severe reactions post vaccine: Marland Kitchen Difficulty breathing  . Swelling of face and throat  . A fast heartbeat  . A bad rash all over body  . Dizziness and weakness   Immunizations Administered    Name Date Dose VIS Date Route   Pfizer COVID-19 Vaccine 12/24/2019 11:45 AM 0.3 mL 09/19/2019 Intramuscular   Manufacturer: [employer], [employer]   Lot: [id number]   NDC: [address]-3

## 2019-12-25 ENCOUNTER — Telehealth: Payer: Self-pay | Admitting: Nurse Practitioner

## 2019-12-25 DIAGNOSIS — E782 Mixed hyperlipidemia: Secondary | ICD-10-CM

## 2019-12-25 MED ORDER — EZETIMIBE 10 MG PO TABS: 10.0000 mg | ORAL_TABLET | Freq: Every day | ORAL | 1 refills | Status: DC

## 2019-12-25 MED ORDER — FUROSEMIDE 20 MG PO TABS: 10.0000 mg | ORAL_TABLET | Freq: Every day | ORAL | 1 refills | Status: DC

## 2019-12-25 NOTE — Assessment & Plan Note (Signed)
Will try lyrica. New rx sent F/up in 56month

## 2019-12-25 NOTE — Telephone Encounter (Signed)
zetia sent

## 2019-12-25 NOTE — Telephone Encounter (Signed)
-----   Message from Livingston Diones, LPN sent at 12/26/377  3:14 PM EDT ----- Pt verbalize understand of test result.   Pt was wondering if you can send in Zetia to help lower cholesterol down--due to the lab result that was done in 06/2019.

## 2019-12-25 NOTE — Telephone Encounter (Signed)
Left detail message inform the pt.  

## 2019-12-31 DIAGNOSIS — J301 Allergic rhinitis due to pollen: Secondary | ICD-10-CM | POA: Diagnosis not present

## 2020-01-01 ENCOUNTER — Telehealth: Payer: Self-pay | Admitting: Nurse Practitioner

## 2020-01-01 NOTE — Telephone Encounter (Signed)
Hold zetia Continue lyrica If no improvement in 2days, hold lyrica and resume zetia. Call office with update on Monday

## 2020-01-01 NOTE — Telephone Encounter (Signed)
Patient is calling and requesting a call back regarding medication that was recently prescribed. CB is [phone]

## 2020-01-01 NOTE — Telephone Encounter (Signed)
Pt call stating she is experiencing back aching--hard to stand up in the morning since she started to take Lyrica and Zetia. Pt is not sure if this is a side effect of medication or which med because she is taking it 30 min apart at night for right now.   Please advise

## 2020-01-01 NOTE — Telephone Encounter (Signed)
Pt notified of directions an verbally understood what to do.

## 2020-01-08 DIAGNOSIS — J301 Allergic rhinitis due to pollen: Secondary | ICD-10-CM | POA: Diagnosis not present

## 2020-01-14 DIAGNOSIS — J301 Allergic rhinitis due to pollen: Secondary | ICD-10-CM | POA: Diagnosis not present

## 2020-01-21 DIAGNOSIS — J301 Allergic rhinitis due to pollen: Secondary | ICD-10-CM | POA: Diagnosis not present

## 2020-01-22 ENCOUNTER — Telehealth: Payer: Medicare Other | Admitting: Nurse Practitioner

## 2020-01-27 ENCOUNTER — Encounter: Payer: Self-pay | Admitting: Nurse Practitioner

## 2020-01-27 DIAGNOSIS — H35372 Puckering of macula, left eye: Secondary | ICD-10-CM | POA: Diagnosis not present

## 2020-01-27 DIAGNOSIS — H5203 Hypermetropia, bilateral: Secondary | ICD-10-CM | POA: Diagnosis not present

## 2020-01-27 DIAGNOSIS — H52223 Regular astigmatism, bilateral: Secondary | ICD-10-CM | POA: Diagnosis not present

## 2020-01-27 DIAGNOSIS — H524 Presbyopia: Secondary | ICD-10-CM | POA: Diagnosis not present

## 2020-01-27 LAB — HM DIABETES EYE EXAM

## 2020-01-28 DIAGNOSIS — J301 Allergic rhinitis due to pollen: Secondary | ICD-10-CM | POA: Diagnosis not present

## 2020-02-02 ENCOUNTER — Telehealth: Payer: Self-pay | Admitting: Internal Medicine

## 2020-02-02 NOTE — Telephone Encounter (Signed)
Harrington Challenger Order: 30mg  #1 prefilled syringe Ordered date: 02/02/2020 Expected date of arrival: 02/03/2020 Ordered by: 02/05/2020, CMA  [facility]ty [facility]

## 2020-02-03 ENCOUNTER — Telehealth: Payer: Self-pay | Admitting: Nurse Practitioner

## 2020-02-03 NOTE — Telephone Encounter (Signed)
Attempted to call patient to reschedule cancelled appt from 01/22/20. No answer/no voicemail.

## 2020-02-03 NOTE — Telephone Encounter (Signed)
Fasenra Shipment Received:  30mg  #1 prefilled syringe Medication arrival date: 02/03/2020 Lot #: [id number] Exp date: 04/2021 Received by: 05/2021, CMA

## 2020-02-04 DIAGNOSIS — J301 Allergic rhinitis due to pollen: Secondary | ICD-10-CM | POA: Diagnosis not present

## 2020-02-06 ENCOUNTER — Other Ambulatory Visit: Payer: Self-pay

## 2020-02-06 ENCOUNTER — Encounter: Payer: Self-pay | Admitting: Nurse Practitioner

## 2020-02-06 ENCOUNTER — Telehealth (INDEPENDENT_AMBULATORY_CARE_PROVIDER_SITE_OTHER): Payer: Medicare Other | Admitting: Nurse Practitioner

## 2020-02-06 VITALS — Ht 67.0 in

## 2020-02-06 DIAGNOSIS — G629 Polyneuropathy, unspecified: Secondary | ICD-10-CM | POA: Diagnosis not present

## 2020-02-06 DIAGNOSIS — B029 Zoster without complications: Secondary | ICD-10-CM | POA: Diagnosis not present

## 2020-02-06 MED ORDER — VALACYCLOVIR HCL 1 G PO TABS: 1000.0000 mg | ORAL_TABLET | Freq: Three times a day (TID) | ORAL | 0 refills | Status: DC

## 2020-02-06 MED ORDER — PREGABALIN 25 MG PO CAPS: ORAL_CAPSULE | ORAL | 5 refills | Status: DC

## 2020-02-06 NOTE — Progress Notes (Signed)
Virtual Visit via Video Note  I connected with@ on 02/06/20 at  1:00 PM EDT by a video enabled telemedicine application and verified that I am speaking with the correct person using two identifiers.  Location: Patient:Home Provider: Office Participants: patient, daughter and provider   I discussed the limitations of evaluation and management by telemedicine and the availability of in person appointments. I also discussed with the patient that there may be a patient responsible charge related to this service. The patient expressed understanding and agreed to proceed.  CC:f/up neuropathy and rash eval  History of Present Illness: Rash This is a new problem. The current episode started in the past 7 days. The problem has been gradually worsening since onset. The affected locations include the torso. The rash is characterized by blistering, burning, pain and redness. She was exposed to nothing. Pertinent negatives include no fatigue, fever or shortness of breath. Past treatments include topical steroids. The treatment provided no relief. Her past medical history is significant for allergies, asthma and varicella. There is no history of eczema.  localized along right T7-T9 dermatome.  Neuropathy: Did not start lyrica as prescribed due to concerns about side effects.  Observations/Objective: Physical Exam  Constitutional: She is oriented to person, place, and time. No distress.  Pulmonary/Chest: Effort normal.  Neurological: She is alert and oriented to person, place, and time.  Skin: Rash noted. Rash is vesicular. There is erythema.     T7-T9 dermatome  Psychiatric: She has a normal mood and affect. Her behavior is normal. Thought content normal.   Assessment and Plan: Prisma was seen today for follow-up and rash.  Diagnoses and all orders for this visit:  Herpes zoster without complication -     valACYclovir (VALTREX) 1000 MG tablet; Take 1 tablet (1,000 mg total) by mouth 3  (three) times daily. -     pregabalin (LYRICA) 25 MG capsule; I cap at hs x 2weeks, then 1cap in AM and PM continuously  Peripheral polyneuropathy -     pregabalin (LYRICA) 25 MG capsule; I cap at hs x 2weeks, then 1cap in AM and PM continuously   Follow Up Instructions: Start lyrica and valtrex Use cold compress as needed for pain May also use calamine lotion or aloe vera gel to soothe skin Call office if no improvement in 1week   I discussed the assessment and treatment plan with the patient. The patient was provided an opportunity to ask questions and all were answered. The patient agreed with the plan and demonstrated an understanding of the instructions.   The patient was advised to call back or seek an in-person evaluation if the symptoms worsen or if the condition fails to improve as anticipated.   Alysia Penna, NP

## 2020-02-06 NOTE — Patient Instructions (Addendum)
Start lyrica and valtrex Use cold compress as needed for pain May also use calamine lotion or aloe vera gel to soothe skin Call office if no improvement in 1week Shingles  Shingles is an infection. It gives you a painful skin rash and blisters that have fluid in them. Shingles is caused by the same germ (virus) that causes chickenpox. Shingles only happens in people who:  Have had chickenpox.  Have been given a shot of medicine (vaccine) to protect against chickenpox. Shingles is rare in this group. The first symptoms of shingles may be itching, tingling, or pain in an area on your skin. A rash will show on your skin a few days or weeks later. The rash is likely to be on one side of your body. The rash usually has a shape like a belt or a band. Over time, the rash turns into fluid-filled blisters. The blisters will break open, change into scabs, and dry up. Medicines may:  Help with pain and itching.  Help you get better sooner.  Help to prevent long-term problems. Follow these instructions at home: Medicines  Take over-the-counter and prescription medicines only as told by your doctor.  Put on an anti-itch cream or numbing cream where you have a rash, blisters, or scabs. Do this as told by your doctor. Helping with itching and discomfort   Put cold, wet cloths (cold compresses) on the area of the rash or blisters as told by your doctor.  Cool baths can help you feel better. Try adding baking soda or dry oatmeal to the water to lessen itching. Do not bathe in hot water. Blister and rash care  Keep your rash covered with a loose bandage (dressing).  Wear loose clothing that does not rub on your rash.  Keep your rash and blisters clean. To do this, wash the area with mild soap and cool water as told by your doctor.  Check your rash every day for signs of infection. Check for: ? More redness, swelling, or pain. ? Fluid or blood. ? Warmth. ? Pus or a bad smell.  Do not  scratch your rash. Do not pick at your blisters. To help you to not scratch: ? Keep your fingernails clean and cut short. ? Wear gloves or mittens when you sleep, if scratching is a problem. General instructions  Rest as told by your doctor.  Keep all follow-up visits as told by your doctor. This is important.  Wash your hands often with soap and water. If soap and water are not available, use hand sanitizer. Doing this lowers your chance of getting a skin infection caused by germs (bacteria).  Your infection can cause chickenpox in people who have never had chickenpox or never got a shot of chickenpox vaccine. If you have blisters that did not change into scabs yet, try not to touch other people or be around other people, especially: ? Babies. ? Pregnant women. ? Children who have areas of red, itchy, or rough skin (eczema). ? Very old people who have transplants. ? People who have a long-term (chronic) sickness, like cancer or AIDS. Contact a doctor if:  Your pain does not get better with medicine.  Your pain does not get better after the rash heals.  You have any signs of infection in the rash area. These signs include: ? More redness, swelling, or pain around the rash. ? Fluid or blood coming from the rash. ? The rash area feeling warm to the touch. ? Pus or a  bad smell coming from the rash. Get help right away if:  The rash is on your face or nose.  You have pain in your face or pain by your eye.  You lose feeling on one side of your face.  You have trouble seeing.  You have ear pain, or you have ringing in your ear.  You have a loss of taste.  Your condition gets worse. Summary  Shingles gives you a painful skin rash and blisters that have fluid in them.  Shingles is an infection. It is caused by the same germ (virus) that causes chickenpox.  Keep your rash covered with a loose bandage (dressing). Wear loose clothing that does not rub on your rash.  If you  have blisters that did not change into scabs yet, try not to touch other people or be around people. This information is not intended to replace advice given to you by your health care provider. Make sure you discuss any questions you have with your health care provider. Document Revised: 01/17/2019 Document Reviewed: 05/30/2017 Elsevier Patient Education  2020 [employer].

## 2020-02-12 ENCOUNTER — Ambulatory Visit: Payer: Medicare Other

## 2020-02-12 ENCOUNTER — Ambulatory Visit (INDEPENDENT_AMBULATORY_CARE_PROVIDER_SITE_OTHER): Payer: Medicare Other

## 2020-02-12 ENCOUNTER — Other Ambulatory Visit: Payer: Self-pay

## 2020-02-12 DIAGNOSIS — J45909 Unspecified asthma, uncomplicated: Secondary | ICD-10-CM

## 2020-02-12 MED ORDER — BENRALIZUMAB 30 MG/ML ~~LOC~~ SOSY
30.0000 mg | PREFILLED_SYRINGE | Freq: Once | SUBCUTANEOUS | Status: AC
  Administered 2020-02-12: 30 mg via SUBCUTANEOUS

## 2020-02-12 NOTE — Progress Notes (Signed)
All questions were answered by the patient before medication was administered. Have you been hospitalized in the last 10 days? No Do you have a fever? No Do you have a cough? No Do you have a headache or sore throat? No  

## 2020-02-16 ENCOUNTER — Telehealth: Payer: Self-pay | Admitting: Nurse Practitioner

## 2020-02-16 NOTE — Telephone Encounter (Signed)
Rachael Silva please advise pt message below.  Pt asking how soon she can take 2 pills of the pregabalin instead of 1. She is still having pain from her shingles.

## 2020-02-16 NOTE — Telephone Encounter (Signed)
Patient is calling in asking about how soon she can take pregabalin (LYRICA) 25 MG capsule 2 pills instead of one, states she  was told it would be 10 days to wait but on prescription bottle it says 14 days but she is still having pain from shingles.

## 2020-02-17 NOTE — Telephone Encounter (Signed)
Pt was notified and verbally understood to take 1 capsule in morning and one at night.

## 2020-02-17 NOTE — Telephone Encounter (Signed)
Ok to increase dose to 1cap in AM and PM

## 2020-02-18 DIAGNOSIS — J301 Allergic rhinitis due to pollen: Secondary | ICD-10-CM | POA: Diagnosis not present

## 2020-02-25 DIAGNOSIS — J301 Allergic rhinitis due to pollen: Secondary | ICD-10-CM | POA: Diagnosis not present

## 2020-03-07 ENCOUNTER — Other Ambulatory Visit: Payer: Self-pay | Admitting: Internal Medicine

## 2020-03-10 DIAGNOSIS — J301 Allergic rhinitis due to pollen: Secondary | ICD-10-CM | POA: Diagnosis not present

## 2020-03-17 DIAGNOSIS — J301 Allergic rhinitis due to pollen: Secondary | ICD-10-CM | POA: Diagnosis not present

## 2020-03-19 DIAGNOSIS — J301 Allergic rhinitis due to pollen: Secondary | ICD-10-CM | POA: Diagnosis not present

## 2020-03-24 DIAGNOSIS — J301 Allergic rhinitis due to pollen: Secondary | ICD-10-CM | POA: Diagnosis not present

## 2020-03-29 ENCOUNTER — Telehealth: Payer: Self-pay | Admitting: Internal Medicine

## 2020-03-29 NOTE — Telephone Encounter (Signed)
[facility] Order: 30mg #1 prefilled syringe Ordered date: 03/29/20 Expected date of arrival: 03/30/20 Ordered by: Lawanda Holzheimer,LPN [facility]ty [facility]: [facility] 

## 2020-03-30 NOTE — Telephone Encounter (Signed)
Fasenra Shipment Received:  30mg #1 prefilled syringe Medication arrival date: 03/30/2020 Lot #: [id number] Exp date: 06/2021 Received by: Inaya Gillham, CMA   

## 2020-04-09 ENCOUNTER — Ambulatory Visit: Payer: Medicare Other

## 2020-04-13 ENCOUNTER — Ambulatory Visit (INDEPENDENT_AMBULATORY_CARE_PROVIDER_SITE_OTHER): Payer: Medicare Other

## 2020-04-13 ENCOUNTER — Other Ambulatory Visit: Payer: Self-pay | Admitting: Internal Medicine

## 2020-04-13 ENCOUNTER — Other Ambulatory Visit: Payer: Self-pay

## 2020-04-13 DIAGNOSIS — J45909 Unspecified asthma, uncomplicated: Secondary | ICD-10-CM | POA: Diagnosis not present

## 2020-04-13 MED ORDER — BENRALIZUMAB 30 MG/ML ~~LOC~~ SOSY
30.0000 mg | PREFILLED_SYRINGE | Freq: Once | SUBCUTANEOUS | Status: AC
  Administered 2020-04-13: 30 mg via SUBCUTANEOUS

## 2020-04-13 NOTE — Progress Notes (Signed)
Have you been hospitalized within the last 10 days?  No Do you have a fever?  No Do you have a cough?  No Do you have a headache or sore throat? No Do you have your Epi Pen visible and is it within date?  Yes 

## 2020-04-28 DIAGNOSIS — J301 Allergic rhinitis due to pollen: Secondary | ICD-10-CM | POA: Diagnosis not present

## 2020-05-05 DIAGNOSIS — J301 Allergic rhinitis due to pollen: Secondary | ICD-10-CM | POA: Diagnosis not present

## 2020-05-06 ENCOUNTER — Telehealth: Payer: Self-pay | Admitting: Internal Medicine

## 2020-05-07 ENCOUNTER — Encounter: Payer: Self-pay | Admitting: Nurse Practitioner

## 2020-05-07 ENCOUNTER — Ambulatory Visit (INDEPENDENT_AMBULATORY_CARE_PROVIDER_SITE_OTHER): Payer: Medicare Other | Admitting: Nurse Practitioner

## 2020-05-07 ENCOUNTER — Other Ambulatory Visit: Payer: Self-pay

## 2020-05-07 VITALS — BP 140/76 | HR 71 | Temp 97.4°F | Ht 67.0 in | Wt 255.4 lb

## 2020-05-07 DIAGNOSIS — Z8619 Personal history of other infectious and parasitic diseases: Secondary | ICD-10-CM | POA: Insufficient documentation

## 2020-05-07 DIAGNOSIS — Z23 Encounter for immunization: Secondary | ICD-10-CM | POA: Diagnosis not present

## 2020-05-07 DIAGNOSIS — I10 Essential (primary) hypertension: Secondary | ICD-10-CM | POA: Diagnosis not present

## 2020-05-07 DIAGNOSIS — B029 Zoster without complications: Secondary | ICD-10-CM | POA: Diagnosis not present

## 2020-05-07 DIAGNOSIS — E782 Mixed hyperlipidemia: Secondary | ICD-10-CM

## 2020-05-07 DIAGNOSIS — G629 Polyneuropathy, unspecified: Secondary | ICD-10-CM | POA: Diagnosis not present

## 2020-05-07 MED ORDER — FUROSEMIDE 20 MG PO TABS: 10.0000 mg | ORAL_TABLET | Freq: Every day | ORAL | 1 refills | Status: DC

## 2020-05-07 MED ORDER — PREGABALIN 25 MG PO CAPS: ORAL_CAPSULE | ORAL | 5 refills | Status: DC

## 2020-05-07 MED ORDER — ZOSTER VAC RECOMB ADJUVANTED 50 MCG/0.5ML IM SUSR: 0.5000 mL | Freq: Once | INTRAMUSCULAR | 0 refills | Status: AC

## 2020-05-07 NOTE — Progress Notes (Signed)
Subjective:  Patient ID: Rachael Silva, female    DOB: [birth year]  Age: 74 y.o. MRN: [id number]  CC: Follow-up (follow up on shingles, no concerns. Patient would like shingles vaccine. )   HPI  Hyperlipidemia ASCVD 15yrs risk of 12%. Unable to tolerate statin even at decreased dose. Did not start zetia as prescribed 76months ago  Advised about her risk for CVD and possible side effect of medication. She agreed to take medication. F/up in 41months for repeat lipid panel and CK  BP Readings from Last 3 Encounters:  05/07/20 (!) 140/76  12/23/19 126/84  08/06/19 135/81   Resolved herpes rash with residual neuropathic pain. Will like shingrix rx.  Reviewed past Medical, Social and Family history today.  Outpatient Medications Prior to Visit  Medication Sig Dispense Refill  . albuterol (VENTOLIN HFA) 108 (90 Base) MCG/ACT inhaler TAKE 2 PUFFS BY MOUTH EVERY 6 HOURS AS NEEDED FOR WHEEZE OR SHORTNESS OF BREATH 18 g 2  . B Complex-C-Folic Acid (STRESS B COMPLEX PO) Take 1 tablet by mouth daily.    . Calcium Carbonate-Vitamin D (CALTRATE 600+D PO) Take 1 tablet by mouth 2 (two) times daily.    . cetirizine (ZYRTEC) 10 MG tablet TAKE 1 TABLET BY MOUTH EVERY DAY 90 tablet 2  . co-enzyme Q-10 30 MG capsule Take 200 mg by mouth daily.    Marland Kitchen EPIPEN 2-PAK 0.3 MG/0.3ML SOAJ injection See admin instructions.  99  . fluticasone furoate-vilanterol (BREO ELLIPTA) 200-25 MCG/INH AEPB Inhale 1 puff into the lungs daily. 1 each 0  . GARLIC PO Take 1 capsule by mouth daily.    . montelukast (SINGULAIR) 10 MG tablet Take 1 tablet (10 mg total) by mouth daily. 90 tablet 2  . Multiple Vitamins-Minerals (CENTRUM ADULTS PO) Take 1 tablet by mouth daily.    . naproxen (NAPROSYN) 500 MG tablet Take by mouth.    Marland Kitchen omeprazole (PRILOSEC) 20 MG capsule Take 1 capsule (20 mg total) by mouth daily.    . ONE TOUCH ULTRA TEST test strip USE TO TEST TWICE DAILY DX E09.9 100 each 0  . ONETOUCH DELICA LANCETS 33G  MISC USE TO TEST SUGARS TWICE DAILY DX E09.9 100 each 0  . SPIRIVA RESPIMAT 1.25 MCG/ACT AERS TAKE 2 PUFFS BY MOUTH TWICE A DAY 4 g 1  . triamcinolone ointment (KENALOG) 0.1 % APPLY TO AFFECTED AREA TWICE A DAY 80 g 0  . furosemide (LASIX) 20 MG tablet Take 0.5 tablets (10 mg total) by mouth daily. 45 tablet 1  . pregabalin (LYRICA) 25 MG capsule I cap at hs x 2weeks, then 1cap in AM and PM continuously 30 capsule 5  . diphenhydrAMINE (BENADRYL ALLERGY) 25 MG tablet Take 1 tablet (25 mg total) by mouth every 8 (eight) hours for 1 day, THEN 1 tablet (25 mg total) every 12 (twelve) hours for 2 days, THEN 1 tablet (25 mg total) every 8 (eight) hours as needed for up to 2 days for itching. 30 tablet 0  . ezetimibe (ZETIA) 10 MG tablet Take 1 tablet (10 mg total) by mouth daily. (Patient not taking: Reported on 02/06/2020) 90 tablet 1  . Fluticasone-Salmeterol (WIXELA INHUB) 250-50 MCG/DOSE AEPB Inhale 1 puff into the lungs 2 (two) times daily. (Patient not taking: Reported on 05/07/2020) 60 each 5  . valACYclovir (VALTREX) 1000 MG tablet Take 1 tablet (1,000 mg total) by mouth 3 (three) times daily. (Patient not taking: Reported on 05/07/2020) 30 tablet 0   Facility-Administered Medications Prior to  Visit  Medication Dose Route Frequency Provider Last Rate Last Admin  . omalizumab Geoffry Paradise) injection 375 mg  375 mg Subcutaneous Q14 Days Erin Fulling, MD   375 mg at 08/16/17 1249  . omalizumab Geoffry Paradise) injection 375 mg  375 mg Subcutaneous Q14 Days Erin Fulling, MD   375 mg at 09/25/17 1638  . omalizumab Geoffry Paradise) injection 375 mg  375 mg Subcutaneous Q14 Days Erin Fulling, MD   375 mg at 10/11/17 1625    ROS See HPI  Objective:  BP (!) 140/76   Pulse 71   Temp (!) 97.4 F (36.3 C) (Tympanic)   Ht 5\' 7"  (1.702 m)   Wt (!) 255 lb 6.4 oz (115.8 kg)   SpO2 95%   BMI 40.00 kg/m   Physical Exam Vitals reviewed.  Constitutional:      Appearance: She is obese.  Cardiovascular:     Rate and Rhythm:  Normal rate.     Pulses: Normal pulses.  Pulmonary:     Effort: Pulmonary effort is normal.  Neurological:     Mental Status: She is alert and oriented to person, place, and time.     Assessment & Plan:  This visit occurred during the SARS-CoV-2 public health emergency.  Safety protocols were in place, including screening questions prior to the visit, additional usage of staff PPE, and extensive cleaning of exam room while observing appropriate contact time as indicated for disinfecting solutions.   Rachael Silva was seen today for follow-up.  Diagnoses and all orders for this visit:  Mixed hyperlipidemia  Encounter for herpes zoster vaccination -     Zoster Vaccine Adjuvanted [facility]) injection; Inject 0.5 mLs into the muscle once for 1 dose.  Hx of herpes zoster  Essential hypertension -     furosemide (LASIX) 20 MG tablet; Take 0.5 tablets (10 mg total) by mouth daily.  Peripheral polyneuropathy -     pregabalin (LYRICA) 25 MG capsule; I cap at hs x 2weeks, then 1cap in AM and PM continuously  Herpes zoster without complication -     pregabalin (LYRICA) 25 MG capsule; I cap at hs x 2weeks, then 1cap in AM and PM continuously    Problem List Items Addressed This Visit      Cardiovascular and Mediastinum   Essential hypertension   Relevant Medications   furosemide (LASIX) 20 MG tablet     Nervous and Auditory   Peripheral polyneuropathy   Relevant Medications   pregabalin (LYRICA) 25 MG capsule     Other   Hx of herpes zoster   Hyperlipidemia - Primary    ASCVD 80yrs risk of 12%. Unable to tolerate statin even at decreased dose. Did not start zetia as prescribed 45months ago  Advised about her risk for CVD and possible side effect of medication. She agreed to take medication. F/up in 75months for repeat lipid panel and CK      Relevant Medications   furosemide (LASIX) 20 MG tablet    Other Visit Diagnoses    Encounter for herpes zoster vaccination        Relevant Medications   Zoster Vaccine Adjuvanted [facility]) injection   Herpes zoster without complication       Relevant Medications   Zoster Vaccine Adjuvanted [facility]) injection   pregabalin (LYRICA) 25 MG capsule      Follow-up: Return in about 3 months (around 08/07/2020) for HTN and , hyperlipidemia (fasting, 08/09/2020).  , NP

## 2020-05-07 NOTE — Telephone Encounter (Signed)
Spoke with pt. Her appointment has been rescheduled to 06/09/2020 at 1500. Nothing further was needed.

## 2020-05-07 NOTE — Assessment & Plan Note (Signed)
ASCVD 56yrs risk of 12%. Unable to tolerate statin even at decreased dose. Did not start zetia as prescribed 60months ago  Advised about her risk for CVD and possible side effect of medication. She agreed to take medication. F/up in 43months for repeat lipid panel and CK

## 2020-05-12 ENCOUNTER — Other Ambulatory Visit: Payer: Self-pay | Admitting: Nurse Practitioner

## 2020-05-12 DIAGNOSIS — E782 Mixed hyperlipidemia: Secondary | ICD-10-CM

## 2020-05-12 DIAGNOSIS — J301 Allergic rhinitis due to pollen: Secondary | ICD-10-CM | POA: Diagnosis not present

## 2020-05-14 ENCOUNTER — Telehealth: Payer: Self-pay | Admitting: Nurse Practitioner

## 2020-05-14 NOTE — Telephone Encounter (Signed)
Patient is calling to schedule shingles vaccine. She states she was waiting to get it from the pharmacy but the pharmacy is waiting to get prior authorization. Patient would like to get the vaccine asap and stated she is willing to pay for it instead of waiting for insurance authorization. Please let me know if it is okay to go ahead and schedule her for shingles vaccine.

## 2020-05-15 NOTE — Telephone Encounter (Signed)
Ok to schedule?  Do you know what she'll pay to have it done here?

## 2020-05-17 NOTE — Telephone Encounter (Signed)
I am not sure about her copay. If she is willing to pay out of pocket, she can ask pharmacy to the cost or she can call her insurance to inquire about cost in administered in our office vs at the pharmacy.

## 2020-05-17 NOTE — Telephone Encounter (Signed)
Left message on voicemail to call office.  

## 2020-05-17 NOTE — Telephone Encounter (Signed)
Caller Name: Rachael Silva Call back phone #: [phone]  Pt said that the shingles shot is $200 per shot to get at the pharmacy unless we obtain the prior authorization. She is wanting [place] to get the authorization asap and notify her when completed so she can go get the vaccination.

## 2020-05-18 NOTE — Telephone Encounter (Signed)
No PA needed. We already know this will not be covered if administered by our office.

## 2020-05-18 NOTE — Telephone Encounter (Signed)
[place] please advise.  Pt is asking for a PA on the shingles shot. Do we do a PA for this the same as medication?

## 2020-05-19 DIAGNOSIS — J301 Allergic rhinitis due to pollen: Secondary | ICD-10-CM | POA: Diagnosis not present

## 2020-05-19 NOTE — Telephone Encounter (Signed)
Please refer to Rachael Silva's response below

## 2020-05-26 DIAGNOSIS — J301 Allergic rhinitis due to pollen: Secondary | ICD-10-CM | POA: Diagnosis not present

## 2020-06-02 DIAGNOSIS — J301 Allergic rhinitis due to pollen: Secondary | ICD-10-CM | POA: Diagnosis not present

## 2020-06-08 ENCOUNTER — Ambulatory Visit: Payer: Medicare Other

## 2020-06-09 ENCOUNTER — Ambulatory Visit (INDEPENDENT_AMBULATORY_CARE_PROVIDER_SITE_OTHER): Payer: Medicare Other

## 2020-06-09 ENCOUNTER — Other Ambulatory Visit: Payer: Self-pay

## 2020-06-09 DIAGNOSIS — J45909 Unspecified asthma, uncomplicated: Secondary | ICD-10-CM | POA: Diagnosis not present

## 2020-06-09 MED ORDER — BENRALIZUMAB 30 MG/ML ~~LOC~~ SOSY
30.0000 mg | PREFILLED_SYRINGE | Freq: Once | SUBCUTANEOUS | Status: AC
  Administered 2020-06-09: 30 mg via SUBCUTANEOUS

## 2020-06-09 NOTE — Progress Notes (Signed)
Have you been hospitalized within the last 10 days?  No Do you have a fever?  No Do you have a cough?  No Do you have a headache or sore throat? No Do you have your Epi Pen visible and is it within date?  Yes 

## 2020-06-16 DIAGNOSIS — J301 Allergic rhinitis due to pollen: Secondary | ICD-10-CM | POA: Diagnosis not present

## 2020-06-18 DIAGNOSIS — J301 Allergic rhinitis due to pollen: Secondary | ICD-10-CM | POA: Diagnosis not present

## 2020-06-23 DIAGNOSIS — J301 Allergic rhinitis due to pollen: Secondary | ICD-10-CM | POA: Diagnosis not present

## 2020-07-05 ENCOUNTER — Other Ambulatory Visit: Payer: Self-pay | Admitting: Internal Medicine

## 2020-07-08 DIAGNOSIS — J301 Allergic rhinitis due to pollen: Secondary | ICD-10-CM | POA: Diagnosis not present

## 2020-07-14 DIAGNOSIS — J301 Allergic rhinitis due to pollen: Secondary | ICD-10-CM | POA: Diagnosis not present

## 2020-07-21 DIAGNOSIS — J301 Allergic rhinitis due to pollen: Secondary | ICD-10-CM | POA: Diagnosis not present

## 2020-07-26 ENCOUNTER — Telehealth: Payer: Self-pay | Admitting: Internal Medicine

## 2020-07-26 NOTE — Telephone Encounter (Signed)
[facility] Order: 30mg  #1 prefilled syringe Ordered date: 07/26/20 Expected date of arrival: 07/27/20 Ordered by: Ferdie Bakken,LPN Specialty Pharmacy: [facility]

## 2020-07-27 NOTE — Telephone Encounter (Signed)
Fasenra Shipment Received:  30mg  #1 prefilled syringe Medication arrival date: 07/27/20 Lot #: [id number] Exp date: 09/07/2021 Received by: 09/09/2021

## 2020-07-28 DIAGNOSIS — J301 Allergic rhinitis due to pollen: Secondary | ICD-10-CM | POA: Diagnosis not present

## 2020-07-29 DIAGNOSIS — J45991 Cough variant asthma: Secondary | ICD-10-CM | POA: Diagnosis not present

## 2020-07-29 DIAGNOSIS — J309 Allergic rhinitis, unspecified: Secondary | ICD-10-CM | POA: Diagnosis not present

## 2020-08-04 ENCOUNTER — Other Ambulatory Visit: Payer: Self-pay

## 2020-08-04 ENCOUNTER — Ambulatory Visit (INDEPENDENT_AMBULATORY_CARE_PROVIDER_SITE_OTHER): Payer: Medicare Other

## 2020-08-04 DIAGNOSIS — J45909 Unspecified asthma, uncomplicated: Secondary | ICD-10-CM | POA: Diagnosis not present

## 2020-08-04 MED ORDER — BENRALIZUMAB 30 MG/ML ~~LOC~~ SOSY
30.0000 mg | PREFILLED_SYRINGE | Freq: Once | SUBCUTANEOUS | Status: AC
  Administered 2020-08-04: 30 mg via SUBCUTANEOUS

## 2020-08-04 NOTE — Progress Notes (Signed)
Have you been hospitalized within the last 10 days?  No Do you have a fever?  No Do you have a cough?  No Do you have a headache or sore throat? No Do you have your Epi Pen visible and is it within date?  Yes 

## 2020-08-09 ENCOUNTER — Encounter: Payer: Self-pay | Admitting: Nurse Practitioner

## 2020-08-09 ENCOUNTER — Ambulatory Visit (INDEPENDENT_AMBULATORY_CARE_PROVIDER_SITE_OTHER): Payer: Medicare Other | Admitting: Nurse Practitioner

## 2020-08-09 ENCOUNTER — Other Ambulatory Visit: Payer: Self-pay

## 2020-08-09 VITALS — BP 140/78 | HR 65 | Temp 96.0°F | Ht 67.0 in | Wt 255.8 lb

## 2020-08-09 DIAGNOSIS — G629 Polyneuropathy, unspecified: Secondary | ICD-10-CM | POA: Diagnosis not present

## 2020-08-09 DIAGNOSIS — Z23 Encounter for immunization: Secondary | ICD-10-CM

## 2020-08-09 DIAGNOSIS — E782 Mixed hyperlipidemia: Secondary | ICD-10-CM

## 2020-08-09 DIAGNOSIS — I10 Essential (primary) hypertension: Secondary | ICD-10-CM

## 2020-08-09 DIAGNOSIS — T380X5S Adverse effect of glucocorticoids and synthetic analogues, sequela: Secondary | ICD-10-CM

## 2020-08-09 DIAGNOSIS — E099 Drug or chemical induced diabetes mellitus without complications: Secondary | ICD-10-CM | POA: Diagnosis not present

## 2020-08-09 LAB — BASIC METABOLIC PANEL
BUN: 13 mg/dL (ref 6–23)
CO2: 27 mEq/L (ref 19–32)
Calcium: 8.9 mg/dL (ref 8.4–10.5)
Chloride: 105 mEq/L (ref 96–112)
Creatinine, Ser: 0.97 mg/dL (ref 0.40–1.20)
GFR: 58.37 mL/min — ABNORMAL LOW (ref >60.00)
Glucose, Bld: 81 mg/dL (ref 70–99)
Potassium: 3.9 mEq/L (ref 3.5–5.1)
Sodium: 139 mEq/L (ref 135–145)

## 2020-08-09 LAB — HEPATIC FUNCTION PANEL
ALT: 9 U/L (ref 0–35)
AST: 12 U/L (ref 0–37)
Albumin: 3.8 g/dL (ref 3.5–5.2)
Alkaline Phosphatase: 85 U/L (ref 39–117)
Bilirubin, Direct: 0.1 mg/dL (ref 0.0–0.3)
Total Bilirubin: 0.6 mg/dL (ref 0.2–1.2)
Total Protein: 7.2 g/dL (ref 6.0–8.3)

## 2020-08-09 LAB — LIPID PANEL
Cholesterol: 184 mg/dL (ref 0–200)
HDL: 64.5 mg/dL (ref >39.00)
LDL Cholesterol: 110 mg/dL — ABNORMAL HIGH (ref 0–99)
NonHDL: 119.88
Total CHOL/HDL Ratio: 3
Triglycerides: 51 mg/dL (ref 0.0–149.0)
VLDL: 10.2 mg/dL (ref 0.0–40.0)

## 2020-08-09 LAB — MICROALBUMIN / CREATININE URINE RATIO
Creatinine,U: 75.5 mg/dL
Microalb Creat Ratio: 0.9 mg/g (ref 0.0–30.0)
Microalb, Ur: <0.7 mg/dL (ref 0.0–1.9)

## 2020-08-09 LAB — HEMOGLOBIN A1C: Hgb A1c MFr Bld: 6.2 % (ref 4.6–6.5)

## 2020-08-09 MED ORDER — EZETIMIBE 10 MG PO TABS: 10.0000 mg | ORAL_TABLET | Freq: Every day | ORAL | 3 refills | Status: DC

## 2020-08-09 MED ORDER — PREGABALIN 25 MG PO CAPS: 25.0000 mg | ORAL_CAPSULE | Freq: Two times a day (BID) | ORAL | 1 refills | Status: DC

## 2020-08-09 MED ORDER — FUROSEMIDE 20 MG PO TABS: 10.0000 mg | ORAL_TABLET | Freq: Every day | ORAL | 1 refills | Status: DC

## 2020-08-09 NOTE — Addendum Note (Signed)
Addended by: Elyn Peers on: 08/09/2020 12:43 PM   Modules accepted: Orders

## 2020-08-09 NOTE — Assessment & Plan Note (Addendum)
No glucose check at goal, last HgbA1c of 6.1 (12/2019) Reports hypoglycemia with metformin No medication at this time Chronic LE neuropathy, stable with lyrica Up to date with eye exam.  Repeat urine microalbumin and hgbA1c today

## 2020-08-09 NOTE — Patient Instructions (Signed)
We will order cologuard.  Go to lab for blood draw and urine collection

## 2020-08-09 NOTE — Assessment & Plan Note (Signed)
BP at goal with furosemide Resolve LE edema with compression stocking and duiretic Repeat BMP today BP Readings from Last 3 Encounters:  08/09/20 140/78  05/07/20 (!) 140/76  12/23/19 126/84

## 2020-08-09 NOTE — Assessment & Plan Note (Signed)
No adverse effects with zetia. Repeat lipid panel today

## 2020-08-09 NOTE — Progress Notes (Signed)
Subjective:  Patient ID: Rachael Silva, female    DOB: Feb 06, 1946  Age: 74 y.o. MRN: [id number]  CC: Follow-up (3 month f/u on HTN, and cholesterol/ Pt is fasting)  HPI  Essential hypertension BP at goal with furosemide Resolve LE edema with compression stocking and duiretic Repeat BMP today BP Readings from Last 3 Encounters:  08/09/20 140/78  05/07/20 (!) 140/76  12/23/19 126/84    Steroid-induced diabetes mellitus No glucose check at goal, last HgbA1c of 6.1 (12/2019) Reports hypoglycemia with metformin No medication at this time Chronic LE neuropathy, stable with lyrica Up to date with eye exam.  Repeat urine microalbumin and hgbA1c today   Hyperlipidemia No adverse effects with zetia. Repeat lipid panel today  BP Readings from Last 3 Encounters:  08/09/20 140/78  05/07/20 (!) 140/76  12/23/19 126/84   Reviewed past Medical, Social and Family history today.  Outpatient Medications Prior to Visit  Medication Sig Dispense Refill  . albuterol (VENTOLIN HFA) 108 (90 Base) MCG/ACT inhaler TAKE 2 PUFFS BY MOUTH EVERY 6 HOURS AS NEEDED FOR WHEEZE OR SHORTNESS OF BREATH 8.5 each 2  . B Complex-C-Folic Acid (STRESS B COMPLEX PO) Take 1 tablet by mouth daily.    . Calcium Carbonate-Vitamin D (CALTRATE 600+D PO) Take 1 tablet by mouth 2 (two) times daily.    . cetirizine (ZYRTEC) 10 MG tablet TAKE 1 TABLET BY MOUTH EVERY DAY 90 tablet 2  . co-enzyme Q-10 30 MG capsule Take 200 mg by mouth daily.    Marland Kitchen EPIPEN 2-PAK 0.3 MG/0.3ML SOAJ injection See admin instructions.  99  . fluticasone furoate-vilanterol (BREO ELLIPTA) 200-25 MCG/INH AEPB Inhale 1 puff into the lungs daily. 1 each 0  . GARLIC PO Take 1 capsule by mouth daily.    . montelukast (SINGULAIR) 10 MG tablet Take 1 tablet (10 mg total) by mouth daily. 90 tablet 2  . Multiple Vitamins-Minerals (CENTRUM ADULTS PO) Take 1 tablet by mouth daily.    . naproxen (NAPROSYN) 500 MG tablet Take by mouth.    Marland Kitchen omeprazole  (PRILOSEC) 20 MG capsule Take 1 capsule (20 mg total) by mouth daily.    . ONE TOUCH ULTRA TEST test strip USE TO TEST TWICE DAILY DX E09.9 100 each 0  . ONETOUCH DELICA LANCETS 33G MISC USE TO TEST SUGARS TWICE DAILY DX E09.9 100 each 0  . SPIRIVA RESPIMAT 1.25 MCG/ACT AERS TAKE 2 PUFFS BY MOUTH TWICE A DAY 4 g 1  . ezetimibe (ZETIA) 10 MG tablet TAKE 1 TABLET BY MOUTH EVERY DAY 90 tablet 0  . furosemide (LASIX) 20 MG tablet Take 0.5 tablets (10 mg total) by mouth daily. 45 tablet 1  . pregabalin (LYRICA) 25 MG capsule I cap at hs x 2weeks, then 1cap in AM and PM continuously 30 capsule 5  . triamcinolone ointment (KENALOG) 0.1 % APPLY TO AFFECTED AREA TWICE A DAY 80 g 0  . diphenhydrAMINE (BENADRYL ALLERGY) 25 MG tablet Take 1 tablet (25 mg total) by mouth every 8 (eight) hours for 1 day, THEN 1 tablet (25 mg total) every 12 (twelve) hours for 2 days, THEN 1 tablet (25 mg total) every 8 (eight) hours as needed for up to 2 days for itching. 30 tablet 0  . Fluticasone-Salmeterol (WIXELA INHUB) 250-50 MCG/DOSE AEPB Inhale 1 puff into the lungs 2 (two) times daily. (Patient not taking: Reported on 05/07/2020) 60 each 5  . valACYclovir (VALTREX) 1000 MG tablet Take 1 tablet (1,000 mg total) by mouth 3 (  three) times daily. (Patient not taking: Reported on 05/07/2020) 30 tablet 0   Facility-Administered Medications Prior to Visit  Medication Dose Route Frequency Provider Last Rate Last Admin  . omalizumab Geoffry Paradise) injection 375 mg  375 mg Subcutaneous Q14 Days Erin Fulling, MD   375 mg at 08/16/17 1249  . omalizumab Geoffry Paradise) injection 375 mg  375 mg Subcutaneous Q14 Days Erin Fulling, MD   375 mg at 09/25/17 1638  . omalizumab Geoffry Paradise) injection 375 mg  375 mg Subcutaneous Q14 Days Erin Fulling, MD   375 mg at 10/11/17 1625    ROS See HPI  Objective:  BP 140/78 (BP Location: Right Arm, Patient Position: Sitting, Cuff Size: Large)   Pulse 65   Temp (!) 96 F (35.6 C) (Temporal)   Ht 5\' 7"  (1.702  m)   Wt 255 lb 12.8 oz (116 kg)   SpO2 97%   BMI 40.06 kg/m   Physical Exam Vitals reviewed.  Constitutional:      Appearance: She is obese.  Cardiovascular:     Rate and Rhythm: Normal rate and regular rhythm.     Pulses: Normal pulses.     Heart sounds: Murmur heard.   Pulmonary:     Effort: Pulmonary effort is normal.     Breath sounds: Normal breath sounds.  Musculoskeletal:     Right lower leg: No edema.     Left lower leg: No edema.  Skin:    General: Skin is warm and dry.  Neurological:     Mental Status: She is alert and oriented to person, place, and time.  Psychiatric:        Mood and Affect: Mood normal.        Behavior: Behavior normal.     Assessment & Plan:  This visit occurred during the SARS-CoV-2 public health emergency.  Safety protocols were in place, including screening questions prior to the visit, additional usage of staff PPE, and extensive cleaning of exam room while observing appropriate contact time as indicated for disinfecting solutions.   Rachael Silva was seen today for follow-up.  Diagnoses and all orders for this visit:  Essential hypertension -     Basic metabolic panel -     furosemide (LASIX) 20 MG tablet; Take 0.5 tablets (10 mg total) by mouth daily.  Steroid-induced diabetes mellitus, sequela (HCC) -     Hemoglobin A1c -     Microalbumin / creatinine urine ratio -     Basic metabolic panel  Mixed hyperlipidemia -     Hepatic function panel -     Lipid panel -     ezetimibe (ZETIA) 10 MG tablet; Take 1 tablet (10 mg total) by mouth daily.  Peripheral polyneuropathy -     pregabalin (LYRICA) 25 MG capsule; Take 1 capsule (25 mg total) by mouth 2 (two) times daily.   Problem List Items Addressed This Visit      Cardiovascular and Mediastinum   Essential hypertension - Primary    BP at goal with furosemide Resolve LE edema with compression stocking and duiretic Repeat BMP today BP Readings from Last 3 Encounters:  08/09/20  140/78  05/07/20 (!) 140/76  12/23/19 126/84        Relevant Medications   ezetimibe (ZETIA) 10 MG tablet   furosemide (LASIX) 20 MG tablet   Other Relevant Orders   Basic metabolic panel     Endocrine   Steroid-induced diabetes mellitus (HCC)    No glucose check at goal, last HgbA1c  of 6.1 (12/2019) Reports hypoglycemia with metformin No medication at this time Chronic LE neuropathy, stable with lyrica Up to date with eye exam.  Repeat urine microalbumin and hgbA1c today       Relevant Orders   Hemoglobin A1c   Microalbumin / creatinine urine ratio   Basic metabolic panel     Nervous and Auditory   Peripheral polyneuropathy   Relevant Medications   pregabalin (LYRICA) 25 MG capsule     Other   Hyperlipidemia    No adverse effects with zetia. Repeat lipid panel today      Relevant Medications   ezetimibe (ZETIA) 10 MG tablet   furosemide (LASIX) 20 MG tablet   Other Relevant Orders   Hepatic function panel   Lipid panel      Follow-up: Return in about 6 months (around 02/06/2021) for DM and HTN, hyperlipidemia (F2F, ).  Alysia Penna, NP

## 2020-08-11 DIAGNOSIS — J301 Allergic rhinitis due to pollen: Secondary | ICD-10-CM | POA: Diagnosis not present

## 2020-08-18 DIAGNOSIS — J301 Allergic rhinitis due to pollen: Secondary | ICD-10-CM | POA: Diagnosis not present

## 2020-08-19 ENCOUNTER — Ambulatory Visit (INDEPENDENT_AMBULATORY_CARE_PROVIDER_SITE_OTHER): Payer: Medicare Other

## 2020-08-19 VITALS — Ht 67.0 in | Wt 255.0 lb

## 2020-08-19 DIAGNOSIS — Z1211 Encounter for screening for malignant neoplasm of colon: Secondary | ICD-10-CM

## 2020-08-19 DIAGNOSIS — Z1231 Encounter for screening mammogram for malignant neoplasm of breast: Secondary | ICD-10-CM

## 2020-08-19 DIAGNOSIS — Z0000 Encounter for general adult medical examination without abnormal findings: Secondary | ICD-10-CM

## 2020-08-19 NOTE — Patient Instructions (Signed)
[name] , Thank you for taking time to complete your Medicare Wellness Visit. I appreciate your ongoing commitment to your health goals. Please review the following plan we discussed and let me know if I can assist you in the future.   Screening recommendations/referrals: Colonoscopy: Cologuard ordered today. You will receive a kit in the mail with instructions. Mammogram: Ordered today. Someone will be calling you to schedule. Bone Density: Completed 09/08/2019- Due- 09/07/2021 Recommended yearly ophthalmology/optometry visit for glaucoma screening and checkup Recommended yearly dental visit for hygiene and checkup  Vaccinations: Influenza vaccine: Up to date Pneumococcal vaccine: Completed vaccines Tdap vaccine:  Discuss with pharmacy Shingles vaccine: Discuss with pharmacy   Covid-19:Completed vaccines  Advanced directives: Declined information  Conditions/risks identified: See problem list  Next appointment: Follow up in one year for your annual wellness visit    Preventive Care 65 Years and Older, Female Preventive care refers to lifestyle choices and visits with your health care provider that can promote health and wellness. What does preventive care include?  A yearly physical exam. This is also called an annual well check.  Dental exams once or twice a year.  Routine eye exams. Ask your health care provider how often you should have your eyes checked.  Personal lifestyle choices, including:  Daily care of your teeth and gums.  Regular physical activity.  Eating a healthy diet.  Avoiding tobacco and drug use.  Limiting alcohol use.  Practicing safe sex.  Taking low-dose aspirin every day.  Taking vitamin and mineral supplements as recommended by your health care provider. What happens during an annual well check? The services and screenings done by your health care provider during your annual well check will depend on your age, overall health, lifestyle  risk factors, and family history of disease. Counseling  Your health care provider may ask you questions about your:  Alcohol use.  Tobacco use.  Drug use.  Emotional well-being.  Home and relationship well-being.  Sexual activity.  Eating habits.  History of falls.  Memory and ability to understand (cognition).  Work and work Statistician.  Reproductive health. Screening  You may have the following tests or measurements:  Height, weight, and BMI.  Blood pressure.  Lipid and cholesterol levels. These may be checked every 5 years, or more frequently if you are over 110 years old.  Skin check.  Lung cancer screening. You may have this screening every year starting at age 27 if you have a 30-pack-year history of smoking and currently smoke or have quit within the past 15 years.  Fecal occult blood test (FOBT) of the stool. You may have this test every year starting at age 58.  Flexible sigmoidoscopy or colonoscopy. You may have a sigmoidoscopy every 5 years or a colonoscopy every 10 years starting at age 42.  Hepatitis C blood test.  Hepatitis B blood test.  Sexually transmitted disease (STD) testing.  Diabetes screening. This is done by checking your blood sugar (glucose) after you have not eaten for a while (fasting). You may have this done every 1-3 years.  Bone density scan. This is done to screen for osteoporosis. You may have this done starting at age 63.  Mammogram. This may be done every 1-2 years. Talk to your health care provider about how often you should have regular mammograms. Talk with your health care provider about your test results, treatment options, and if necessary, the need for more tests. Vaccines  Your health care provider may recommend certain vaccines,  such as:  Influenza vaccine. This is recommended every year.  Tetanus, diphtheria, and acellular pertussis (Tdap, Td) vaccine. You may need a Td booster every 10 years.  Zoster vaccine.  You may need this after age 18.  Pneumococcal 13-valent conjugate (PCV13) vaccine. One dose is recommended after age 89.  Pneumococcal polysaccharide (PPSV23) vaccine. One dose is recommended after age 76. Talk to your health care provider about which screenings and vaccines you need and how often you need them. This information is not intended to replace advice given to you by your health care provider. Make sure you discuss any questions you have with your health care provider. Document Released: 10/22/2015 Document Revised: 06/14/2016 Document Reviewed: 07/27/2015 Elsevier Interactive Patient Education  2017 [place] Prevention in the Home Falls can cause injuries. They can happen to people of all ages. There are many things you can do to make your home safe and to help prevent falls. What can I do on the outside of my home?  Regularly fix the edges of walkways and driveways and fix any cracks.  Remove anything that might make you trip as you walk through a door, such as a raised step or threshold.  Trim any bushes or trees on the path to your home.  Use bright outdoor lighting.  Clear any walking paths of anything that might make someone trip, such as rocks or tools.  Regularly check to see if handrails are loose or broken. Make sure that both sides of any steps have handrails.  Any raised decks and porches should have guardrails on the edges.  Have any leaves, snow, or ice cleared regularly.  Use sand or salt on walking paths during winter.  Clean up any spills in your garage right away. This includes oil or grease spills. What can I do in the bathroom?  Use night lights.  Install grab bars by the toilet and in the tub and shower. Do not use towel bars as grab bars.  Use non-skid mats or decals in the tub or shower.  If you need to sit down in the shower, use a plastic, non-slip stool.  Keep the floor dry. Clean up any water that spills on the floor as soon  as it happens.  Remove soap buildup in the tub or shower regularly.  Attach bath mats securely with double-sided non-slip rug tape.  Do not have throw rugs and other things on the floor that can make you trip. What can I do in the bedroom?  Use night lights.  Make sure that you have a light by your bed that is easy to reach.  Do not use any sheets or blankets that are too big for your bed. They should not hang down onto the floor.  Have a firm chair that has side arms. You can use this for support while you get dressed.  Do not have throw rugs and other things on the floor that can make you trip. What can I do in the kitchen?  Clean up any spills right away.  Avoid walking on wet floors.  Keep items that you use a lot in easy-to-reach places.  If you need to reach something above you, use a strong step stool that has a grab bar.  Keep electrical cords out of the way.  Do not use floor polish or wax that makes floors slippery. If you must use wax, use non-skid floor wax.  Do not have throw rugs and other things on  the floor that can make you trip. What can I do with my stairs?  Do not leave any items on the stairs.  Make sure that there are handrails on both sides of the stairs and use them. Fix handrails that are broken or loose. Make sure that handrails are as long as the stairways.  Check any carpeting to make sure that it is firmly attached to the stairs. Fix any carpet that is loose or worn.  Avoid having throw rugs at the top or bottom of the stairs. If you do have throw rugs, attach them to the floor with carpet tape.  Make sure that you have a light switch at the top of the stairs and the bottom of the stairs. If you do not have them, ask someone to add them for you. What else can I do to help prevent falls?  Wear shoes that:  Do not have high heels.  Have rubber bottoms.  Are comfortable and fit you well.  Are closed at the toe. Do not wear sandals.  If  you use a stepladder:  Make sure that it is fully opened. Do not climb a closed stepladder.  Make sure that both sides of the stepladder are locked into place.  Ask someone to hold it for you, if possible.  Clearly mark and make sure that you can see:  Any grab bars or handrails.  First and last steps.  Where the edge of each step is.  Use tools that help you move around (mobility aids) if they are needed. These include:  Canes.  Walkers.  Scooters.  Crutches.  Turn on the lights when you go into a dark area. Replace any light bulbs as soon as they burn out.  Set up your furniture so you have a clear path. Avoid moving your furniture around.  If any of your floors are uneven, fix them.  If there are any pets around you, be aware of where they are.  Review your medicines with your doctor. Some medicines can make you feel dizzy. This can increase your chance of falling. Ask your doctor what other things that you can do to help prevent falls. This information is not intended to replace advice given to you by your health care provider. Make sure you discuss any questions you have with your health care provider. Document Released: 07/22/2009 Document Revised: 03/02/2016 Document Reviewed: 10/30/2014 Elsevier Interactive Patient Education  2017 [employer].

## 2020-08-19 NOTE — Progress Notes (Signed)
Subjective:   Rachael Silva is a 74 y.o. female who presents for Medicare Annual (Subsequent) preventive examination.   I connected with Elza today by telephone and verified that I am speaking with the correct person using two identifiers. Location patient: home Location provider: work Persons participating in the virtual visit: patient, Engineer, civil (consulting).    I discussed the limitations, risks, security and privacy concerns of performing an evaluation and management service by telephone and the availability of in person appointments. I also discussed with the patient that there may be a patient responsible charge related to this service. The patient expressed understanding and verbally consented to this telephonic visit.    Interactive audio and video telecommunications were attempted between this provider and patient, however failed, due to patient having technical difficulties OR patient did not have access to video capability.  We continued and completed visit with audio only.  Some vital signs may be absent or patient reported.   Time Spent with patient on telephone encounter: 20 minutes   Review of Systems     Cardiac Risk Factors include: advanced age (>43men, >41 women);dyslipidemia;hypertension;diabetes mellitus;obesity (BMI >30kg/m2)     Objective:    Today's Vitals   08/19/20 1116  Weight: 255 lb (115.7 kg)  Height: 5\' 7"  (1.702 m)   Body mass index is 39.94 kg/m.  Advanced Directives 08/19/2020 08/06/2019 07/01/2018 02/25/2018 02/15/2018 11/07/2017 05/31/2016  Does Patient Have a Medical Advance Directive? No No No No No No No  Would patient like information on creating a medical advance directive? No - Patient declined No - Patient declined No - Patient declined No - Patient declined No - Patient declined No - Patient declined Yes - Educational materials given    Current Medications (verified) Outpatient Encounter Medications as of 08/19/2020  Medication Sig  . albuterol  (VENTOLIN HFA) 108 (90 Base) MCG/ACT inhaler TAKE 2 PUFFS BY MOUTH EVERY 6 HOURS AS NEEDED FOR WHEEZE OR SHORTNESS OF BREATH  . B Complex-C-Folic Acid (STRESS B COMPLEX PO) Take 1 tablet by mouth daily.  . Calcium Carbonate-Vitamin D (CALTRATE 600+D PO) Take 1 tablet by mouth 2 (two) times daily.  . cetirizine (ZYRTEC) 10 MG tablet TAKE 1 TABLET BY MOUTH EVERY DAY  . co-enzyme Q-10 30 MG capsule Take 200 mg by mouth daily.  13/08/2020 EPIPEN 2-PAK 0.3 MG/0.3ML SOAJ injection See admin instructions.  Marland Kitchen ezetimibe (ZETIA) 10 MG tablet Take 1 tablet (10 mg total) by mouth daily.  . fluticasone furoate-vilanterol (BREO ELLIPTA) 200-25 MCG/INH AEPB Inhale 1 puff into the lungs daily.  . furosemide (LASIX) 20 MG tablet Take 0.5 tablets (10 mg total) by mouth daily.  Marland Kitchen GARLIC PO Take 1 capsule by mouth daily.  . montelukast (SINGULAIR) 10 MG tablet Take 1 tablet (10 mg total) by mouth daily.  . Multiple Vitamins-Minerals (CENTRUM ADULTS PO) Take 1 tablet by mouth daily.  . naproxen (NAPROSYN) 500 MG tablet Take by mouth.  Marland Kitchen omeprazole (PRILOSEC) 20 MG capsule Take 1 capsule (20 mg total) by mouth daily.  . ONE TOUCH ULTRA TEST test strip USE TO TEST TWICE DAILY DX E09.9  . ONETOUCH DELICA LANCETS 33G MISC USE TO TEST SUGARS TWICE DAILY DX E09.9  . pregabalin (LYRICA) 25 MG capsule Take 1 capsule (25 mg total) by mouth 2 (two) times daily.  Marland Kitchen SPIRIVA RESPIMAT 1.25 MCG/ACT AERS TAKE 2 PUFFS BY MOUTH TWICE A DAY   Facility-Administered Encounter Medications as of 08/19/2020  Medication  . omalizumab 13/08/2020) injection 375 mg  .  omalizumab Geoffry Paradise) injection 375 mg  . omalizumab Geoffry Paradise) injection 375 mg    Allergies (verified) Lisinopril, Atorvastatin, and Erythromycin   History: Past Medical History:  Diagnosis Date  . Allergy    allergic rhinitis  . Angioedema 02/2006  . Arthritis    OA of knees  . Asthma   . Complication of anesthesia    apnea- sleep  . Frequent headaches   . Hiatal hernia     . Hypertension   . Hypothyroid   . Labile blood pressure   . Lower extremity edema   . Lung disease   . Pneumonia   . Pneumonia, eosinophilic (HCC)   . Shortness of breath dyspnea   . Sleep apnea   . Urinary incontinence   . Wheezing    Past Surgical History:  Procedure Laterality Date  . BREAST BIOPSY Left 2013   NEG  . BREAST SURGERY  1966   breast biopsy  . CESAREAN SECTION     x2  . FLEXIBLE BRONCHOSCOPY N/A 09/16/2015   Procedure: FLEXIBLE BRONCHOSCOPY WITH FLURO ;  Surgeon: Stephanie Acre, MD;  Location: [facility] ORS;  Service: Pulmonary;  Laterality: N/A;   Family History  Problem Relation Age of Onset  . Diabetes Mother        pre-diabetic  . Heart disease Mother        ? heart disease  . Emphysema Mother   . Thyroid disease Mother   . Diabetes Maternal Uncle   . Heart disease Father   . Breast cancer Neg Hx    Social History   Socioeconomic History  . Marital status: Single    Spouse name: Not on file  . Number of children: 2  . Years of education: college  . Highest education level: Not on file  Occupational History  . Occupation: retired    Associate Professor: RETIRED  Tobacco Use  . Smoking status: Never Smoker  . Smokeless tobacco: Never Used  Vaping Use  . Vaping Use: Never used  Substance and Sexual Activity  . Alcohol use: No    Alcohol/week: 0.0 standard drinks  . Drug use: No  . Sexual activity: Never  Other Topics Concern  . Not on file  Social History Narrative   Lives alone   Left handed    Caffeine use: 1 cup per day   Social Determinants of Health   Financial Resource Strain: Low Risk   . Difficulty of Paying Living Expenses: Not hard at all  Food Insecurity: No Food Insecurity  . Worried About Programme researcher, broadcasting/film/video in the Last Year: Never true  . Ran Out of Food in the Last Year: Never true  Transportation Needs: No Transportation Needs  . Lack of Transportation (Medical): No  . Lack of Transportation (Non-Medical): No  Physical  Activity: Inactive  . Days of Exercise per Week: 0 days  . Minutes of Exercise per Session: 0 min  Stress: No Stress Concern Present  . Feeling of Stress : Not at all  Social Connections: Socially Isolated  . Frequency of Communication with Friends and Family: More than three times a week  . Frequency of Social Gatherings with Friends and Family: More than three times a week  . Attends Religious Services: Never  . Active Member of Clubs or Organizations: No  . Attends Banker Meetings: Never  . Marital Status: Never married    Tobacco Counseling Counseling given: Not Answered   Clinical Intake:  Pre-visit preparation completed: Yes  Pain :  No/denies pain     Nutritional Status: BMI > 30  Obese Nutritional Risks: None Diabetes: Yes CBG done?: No Did pt. bring in CBG monitor from home?: No (phone visit)  How often do you need to have someone help you when you read instructions, pamphlets, or other written materials from your doctor or pharmacy?: 1 - Never What is the last grade level you completed in school?: college  Diabetes:  Is the patient diabetic?  Yes  If diabetic, was a CBG obtained today?  No  Did the patient bring in their glucometer from home?  No phone visit How often do you monitor your CBG's? occasionally.   Financial Strains and Diabetes Management:  Are you having any financial strains with the device, your supplies or your medication? No .  Does the patient want to be seen by Chronic Care Management for management of their diabetes?  No  Would the patient like to be referred to a Nutritionist or for Diabetic Management?  No   Diabetic Exams:  Diabetic Eye Exam: Completed 04/2020-per patient.Requested patient have report sent to PCP  Diabetic Foot Exam: Completed 12/23/2019.    Interpreter Needed?: No  Information entered by :: Thomasenia Sales LPN   Activities of Daily Living In your present state of health, do you have any  difficulty performing the following activities: 08/19/2020  Hearing? Y  Comment hearing aids  Vision? N  Difficulty concentrating or making decisions? N  Walking or climbing stairs? N  Dressing or bathing? N  Doing errands, shopping? N  Preparing Food and eating ? N  Using the Toilet? N  In the past six months, have you accidently leaked urine? N  Do you have problems with loss of bowel control? N  Managing your Medications? N  Managing your Finances? N  Housekeeping or managing your Housekeeping? N  Some recent data might be hidden    Patient Care Team: Nche, Bonna Gains, NP as PCP - General (Internal Medicine) Dayna Barker, MD as Referring Physician (Internal Medicine) Nche, Bonna Gains, NP as Nurse Practitioner (Internal Medicine)  Indicate any recent Medical Services you may have received from other than [facility] providers in the past year (date may be approximate).     Assessment:   This is a routine wellness examination for Arieana.  Hearing/Vision screen  Hearing Screening             Right ear:           Left ear:           Comments: Bilateral hearing aids  Vision Screening Comments: Wears glasses Last eye exam-04/2020-Dr. Hyacinth Meeker  Dietary issues and exercise activities discussed: Current Exercise Habits: The patient does not participate in regular exercise at present, Exercise limited by: None identified  Goals    . Weight (lb) < 250 lb (113.4 kg)      Depression Screen PHQ 2/9 Scores 08/19/2020 12/23/2019 08/06/2019 06/24/2019 11/07/2017 10/19/2017 05/31/2016  PHQ - 2 Score 0 0 0 0 0 0 0  Exception Documentation - - - - - - -    Fall Risk Fall Risk  08/19/2020 08/09/2020 12/23/2019 08/06/2019 06/24/2019  Falls in the past year? 0 0 0 0 0  Number falls in past yr: 0 - 0 0 -  Injury with Fall? 0 - 0 0 -  Follow up Falls prevention discussed - - Education provided;Falls prevention discussed -    Any  stairs in or around the home? Yes  If so,  are there any without handrails? No  Home free of loose throw rugs in walkways, pet beds, electrical cords, etc? Yes  Adequate lighting in your home to reduce risk of falls? Yes   ASSISTIVE DEVICES UTILIZED TO PREVENT FALLS:  Life alert? No  Use of a cane, walker or w/c? Yes cane Grab bars in the bathroom? Yes  Shower chair or bench in shower? No  Elevated toilet seat or a handicapped toilet? No   TIMED UP AND GO:  Was the test performed? No . Phone visit   Cognitive Function:No cognitive impairment noted MMSE - Mini Mental State Exam 11/07/2017 05/31/2016  Orientation to time 5 5  Orientation to Place 5 5  Registration 3 3  Attention/ Calculation 5 0  Recall 3 2  Recall-comments - pt was unable to recall 1 of 3 words  Language- name 2 objects 2 0  Language- repeat 1 1  Language- follow 3 step command 3 3  Language- read & follow direction 1 0  Write a sentence 1 0  Copy design 1 0  Total score 30 19        Immunizations Immunization History  Administered Date(s) Administered  . Fluad Quad(high Dose 65+) 06/10/2019, 08/09/2020  . Influenza Split 07/27/2011, 08/19/2012  . Influenza Whole 08/01/2007, 07/13/2008, 07/20/2009, 07/20/2010  . Influenza, High Dose Seasonal PF 07/16/2018  . Influenza,inj,Quad PF,6+ Mos 07/29/2013, 07/15/2014, 07/09/2015, 07/14/2016, 06/19/2017  . PFIZER SARS-COV-2 Vaccination 12/01/2019, 12/24/2019  . Pneumococcal Conjugate-13 05/31/2016  . Pneumococcal Polysaccharide-23 10/09/2001, 01/13/2014  . Td 10/10/2003    TDAP status: Due, Education has been provided regarding the importance of this vaccine. Advised may receive this vaccine at local pharmacy or Health Dept. Aware to provide a copy of the vaccination record if obtained from local pharmacy or Health Dept. Verbalized acceptance and understanding.   Flu Vaccine status: Up to date   Pneumococcal vaccine status: Up to date   Covid-19 vaccine  status: Completed vaccines  Qualifies for Shingles Vaccine? Yes   Zostavax completed No   Shingrix Completed?: No.    Education has been provided regarding the importance of this vaccine. Patient has been advised to call insurance company to determine out of pocket expense if they have not yet received this vaccine. Advised may also receive vaccine at local pharmacy or Health Dept. Verbalized acceptance and understanding.  Screening Tests Health Maintenance  Topic Date Due  . TETANUS/TDAP  10/09/2013  . OPHTHALMOLOGY EXAM  05/15/2018  . COLONOSCOPY  08/09/2021 (Originally 07/20/2018)  . MAMMOGRAM  11/15/2020  . FOOT EXAM  12/22/2020  . HEMOGLOBIN A1C  02/06/2021  . URINE MICROALBUMIN  08/09/2021  . INFLUENZA VACCINE  Completed  . DEXA SCAN  Completed  . COVID-19 Vaccine  Completed  . Hepatitis C Screening  Completed  . PNA vac Low Risk Adult  Completed    Health Maintenance  Health Maintenance Due  Topic Date Due  . TETANUS/TDAP  10/09/2013  . OPHTHALMOLOGY EXAM  05/15/2018    Colorectal Cancer screening: Cologuard ordered today  Mammogram status: Ordered today. Pt provided with contact info and advised to call to schedule appt.    Bone Density status: Completed 09/08/2019. Results reflect: Bone density results: NORMAL. Repeat every 2 years.  Lung Cancer Screening: (Low Dose CT Chest recommended if Age 57-80 years, 30 pack-year currently smoking OR have quit w/in 15years.) does not qualify.     Additional Screening:  Hepatitis C Screening:  Completed 05/31/2016  Vision Screening: Recommended annual ophthalmology exams  for early detection of glaucoma and other disorders of the eye. Is the patient up to date with their annual eye exam?  Yes  Who is the provider or what is the name of the office in which the patient attends annual eye exams? Dr. Hyacinth MeekerMiller   Dental Screening: Recommended annual dental exams for proper oral hygiene  Community Resource Referral / Chronic  Care Management: CRR required this visit?  No   CCM required this visit?  No      Plan:     I have personally reviewed and noted the following in the patient's chart:   . Medical and social history . Use of alcohol, tobacco or illicit drugs  . Current medications and supplements . Functional ability and status . Nutritional status . Physical activity . Advanced directives . List of other physicians . Hospitalizations, surgeries, and ER visits in previous 12 months . Vitals . Screenings to include cognitive, depression, and falls . Referrals and appointments  In addition, I have reviewed and discussed with patient certain preventive protocols, quality metrics, and best practice recommendations. A written personalized care plan for preventive services as well as general preventive health recommendations were provided to patient.   Due to this being a telephonic visit, the after visit summary with patients personalized plan was offered to patient via mail or my-chart.  Patient would like to access on my-chart.   Roanna RaiderMartha A Esgar Barnick, LPN   [id number]/10/[id number]/08/2020  Nurse Health Advisor  Nurse Notes: None

## 2020-08-20 ENCOUNTER — Telehealth: Payer: Self-pay | Admitting: Internal Medicine

## 2020-08-20 DIAGNOSIS — G4733 Obstructive sleep apnea (adult) (pediatric): Secondary | ICD-10-CM

## 2020-08-20 NOTE — Telephone Encounter (Signed)
Called and spoke to patient, who is requesting order for cpap supplies.  Order has been placed to [facility] per patient request. Nothing further needed.

## 2020-08-30 DIAGNOSIS — J301 Allergic rhinitis due to pollen: Secondary | ICD-10-CM | POA: Diagnosis not present

## 2020-09-08 DIAGNOSIS — J301 Allergic rhinitis due to pollen: Secondary | ICD-10-CM | POA: Diagnosis not present

## 2020-09-10 DIAGNOSIS — J301 Allergic rhinitis due to pollen: Secondary | ICD-10-CM | POA: Diagnosis not present

## 2020-09-15 DIAGNOSIS — J301 Allergic rhinitis due to pollen: Secondary | ICD-10-CM | POA: Diagnosis not present

## 2020-09-20 ENCOUNTER — Telehealth: Payer: Self-pay | Admitting: Internal Medicine

## 2020-09-20 NOTE — Telephone Encounter (Signed)
[facility] Order: 30mg  #1 prefilled syringe Ordered date: 09/20/20 Expected date of arrival: 09/21/20 Ordered by: Athanasios Heldman,LPN Specialty Pharmacy: 09/23/20

## 2020-09-21 NOTE — Telephone Encounter (Signed)
Fasenra Shipment Received:  30mg  #1 prefilled syringe Medication arrival date: 09/21/20 Lot #: [id number] Exp date: 11/08/2021 Received by: 11/10/2021

## 2020-09-22 DIAGNOSIS — J301 Allergic rhinitis due to pollen: Secondary | ICD-10-CM | POA: Diagnosis not present

## 2020-09-29 ENCOUNTER — Other Ambulatory Visit: Payer: Self-pay

## 2020-09-29 ENCOUNTER — Ambulatory Visit (INDEPENDENT_AMBULATORY_CARE_PROVIDER_SITE_OTHER): Payer: Medicare Other

## 2020-09-29 DIAGNOSIS — J45909 Unspecified asthma, uncomplicated: Secondary | ICD-10-CM

## 2020-09-29 MED ORDER — BENRALIZUMAB 30 MG/ML ~~LOC~~ SOSY
30.0000 mg | PREFILLED_SYRINGE | Freq: Once | SUBCUTANEOUS | Status: AC
  Administered 2020-09-29: 30 mg via SUBCUTANEOUS

## 2020-09-29 NOTE — Progress Notes (Signed)
Have you been hospitalized within the last 10 days?  No Do you have a fever?  No Do you have a cough?  No Do you have a headache or sore throat? No Do you have your Epi Pen visible and is it within date?  Yes 

## 2020-10-06 DIAGNOSIS — J301 Allergic rhinitis due to pollen: Secondary | ICD-10-CM | POA: Diagnosis not present

## 2020-10-13 DIAGNOSIS — J301 Allergic rhinitis due to pollen: Secondary | ICD-10-CM | POA: Diagnosis not present

## 2020-11-03 ENCOUNTER — Other Ambulatory Visit: Payer: Self-pay | Admitting: Internal Medicine

## 2020-11-03 DIAGNOSIS — J301 Allergic rhinitis due to pollen: Secondary | ICD-10-CM | POA: Diagnosis not present

## 2020-11-04 ENCOUNTER — Telehealth: Payer: Self-pay | Admitting: Internal Medicine

## 2020-11-04 MED ORDER — SPIRIVA RESPIMAT 1.25 MCG/ACT IN AERS: INHALATION_SPRAY | RESPIRATORY_TRACT | 1 refills | Status: DC

## 2020-11-04 MED ORDER — MONTELUKAST SODIUM 10 MG PO TABS
10.0000 mg | ORAL_TABLET | Freq: Every day | ORAL | 1 refills | Status: DC
Start: 2020-11-04 — End: 2023-07-19

## 2020-11-04 MED ORDER — BREO ELLIPTA 200-25 MCG/INH IN AEPB
1.0000 | INHALATION_SPRAY | Freq: Every day | RESPIRATORY_TRACT | 1 refills | Status: DC
Start: 2020-11-04 — End: 2020-11-04

## 2020-11-04 MED ORDER — BREO ELLIPTA 200-25 MCG/INH IN AEPB: 1.0000 | INHALATION_SPRAY | Freq: Every day | RESPIRATORY_TRACT | 1 refills | Status: DC

## 2020-11-04 NOTE — Telephone Encounter (Signed)
Rx for Rachael Silva and Singulair has been sent to preferred pharmacy.  Left detailed message for patient.  Nothing further needed.

## 2020-11-10 DIAGNOSIS — J301 Allergic rhinitis due to pollen: Secondary | ICD-10-CM | POA: Diagnosis not present

## 2020-11-17 DIAGNOSIS — J301 Allergic rhinitis due to pollen: Secondary | ICD-10-CM | POA: Diagnosis not present

## 2020-11-23 DIAGNOSIS — J301 Allergic rhinitis due to pollen: Secondary | ICD-10-CM | POA: Diagnosis not present

## 2020-11-24 ENCOUNTER — Ambulatory Visit: Payer: Medicare Other

## 2020-11-24 ENCOUNTER — Telehealth: Payer: Self-pay | Admitting: Internal Medicine

## 2020-11-24 NOTE — Telephone Encounter (Signed)
Patient rescheduled for fasenra injection 11/26/20 at 3pm.

## 2020-11-25 ENCOUNTER — Other Ambulatory Visit: Payer: Self-pay

## 2020-11-25 ENCOUNTER — Ambulatory Visit (INDEPENDENT_AMBULATORY_CARE_PROVIDER_SITE_OTHER): Payer: Medicare Other | Admitting: Nurse Practitioner

## 2020-11-25 ENCOUNTER — Encounter: Payer: Self-pay | Admitting: Nurse Practitioner

## 2020-11-25 VITALS — BP 148/60 | HR 76 | Temp 96.0°F | Ht 67.0 in | Wt 251.2 lb

## 2020-11-25 DIAGNOSIS — I10 Essential (primary) hypertension: Secondary | ICD-10-CM | POA: Diagnosis not present

## 2020-11-25 DIAGNOSIS — Z23 Encounter for immunization: Secondary | ICD-10-CM | POA: Diagnosis not present

## 2020-11-25 NOTE — Patient Instructions (Signed)
Monitor BP every morning and record Call office with BP readings on Monday. Maintain DASH diet and adequate oral hydration.  DASH Eating Plan DASH stands for Dietary Approaches to Stop Hypertension. The DASH eating plan is a healthy eating plan that has been shown to:  Reduce high blood pressure (hypertension).  Reduce your risk for type 2 diabetes, heart disease, and stroke.  Help with weight loss. What are tips for following this plan? Reading food labels  Check food labels for the amount of salt (sodium) per serving. Choose foods with less than 5 percent of the Daily Value of sodium. Generally, foods with less than 300 milligrams (mg) of sodium per serving fit into this eating plan.  To find whole grains, look for the word "whole" as the first word in the ingredient list. Shopping  Buy products labeled as "low-sodium" or "no salt added."  Buy fresh foods. Avoid canned foods and pre-made or frozen meals. Cooking  Avoid adding salt when cooking. Use salt-free seasonings or herbs instead of table salt or sea salt. Check with your health care provider or pharmacist before using salt substitutes.  Do not fry foods. Cook foods using healthy methods such as baking, boiling, grilling, roasting, and broiling instead.  Cook with heart-healthy oils, such as olive, canola, avocado, soybean, or sunflower oil. Meal planning  Eat a balanced diet that includes: ? 4 or more servings of fruits and 4 or more servings of vegetables each day. Try to fill one-half of your plate with fruits and vegetables. ? 6-8 servings of whole grains each day. ? Less than 6 oz (170 g) of lean meat, poultry, or fish each day. A 3-oz (85-g) serving of meat is about the same size as a deck of cards. One egg equals 1 oz (28 g). ? 2-3 servings of low-fat dairy each day. One serving is 1 cup (237 mL). ? 1 serving of nuts, seeds, or beans 5 times each week. ? 2-3 servings of heart-healthy fats. Healthy fats called  omega-3 fatty acids are found in foods such as walnuts, flaxseeds, fortified milks, and eggs. These fats are also found in cold-water fish, such as sardines, salmon, and mackerel.  Limit how much you eat of: ? Canned or prepackaged foods. ? Food that is high in trans fat, such as some fried foods. ? Food that is high in saturated fat, such as fatty meat. ? Desserts and other sweets, sugary drinks, and other foods with added sugar. ? Full-fat dairy products.  Do not salt foods before eating.  Do not eat more than 4 egg yolks a week.  Try to eat at least 2 vegetarian meals a week.  Eat more home-cooked food and less restaurant, buffet, and fast food.   Lifestyle  When eating at a restaurant, ask that your food be prepared with less salt or no salt, if possible.  If you drink alcohol: ? Limit how much you use to:  0-1 drink a day for women who are not pregnant.  0-2 drinks a day for men. ? Be aware of how much alcohol is in your drink. In the [place] one drink equals one 12 oz bottle of beer (355 mL), one 5 oz glass of wine (148 mL), or one 1 oz glass of hard liquor (44 mL). General information  Avoid eating more than 2,300 mg of salt a day. If you have hypertension, you may need to reduce your sodium intake to 1,500 mg a day.  Work with your health  care provider to maintain a healthy body weight or to lose weight. Ask what an ideal weight is for you.  Get at least 30 minutes of exercise that causes your heart to beat faster (aerobic exercise) most days of the week. Activities may include walking, swimming, or biking.  Work with your health care provider or dietitian to adjust your eating plan to your individual calorie needs. What foods should I eat? Fruits All fresh, dried, or frozen fruit. Canned fruit in natural juice (without added sugar). Vegetables Fresh or frozen vegetables (raw, steamed, roasted, or grilled). Low-sodium or reduced-sodium tomato and vegetable juice.  Low-sodium or reduced-sodium tomato sauce and tomato paste. Low-sodium or reduced-sodium canned vegetables. Grains Whole-grain or whole-wheat bread. Whole-grain or whole-wheat pasta. Brown rice. Orpah Cobb. Bulgur. Whole-grain and low-sodium cereals. Pita bread. Low-fat, low-sodium crackers. Whole-wheat flour tortillas. Meats and other proteins Skinless chicken or [place]. Ground chicken or [place]. Pork with fat trimmed off. Fish and seafood. Egg whites. Dried beans, peas, or lentils. Unsalted nuts, nut butters, and seeds. Unsalted canned beans. Lean cuts of beef with fat trimmed off. Low-sodium, lean precooked or cured meat, such as sausages or meat loaves. Dairy Low-fat (1%) or fat-free (skim) milk. Reduced-fat, low-fat, or fat-free cheeses. Nonfat, low-sodium ricotta or cottage cheese. Low-fat or nonfat yogurt. Low-fat, low-sodium cheese. Fats and oils Soft margarine without trans fats. Vegetable oil. Reduced-fat, low-fat, or light mayonnaise and salad dressings (reduced-sodium). Canola, safflower, olive, avocado, soybean, and sunflower oils. Avocado. Seasonings and condiments Herbs. Spices. Seasoning mixes without salt. Other foods Unsalted popcorn and pretzels. Fat-free sweets. The items listed above may not be a complete list of foods and beverages you can eat. Contact a dietitian for more information. What foods should I avoid? Fruits Canned fruit in a light or heavy syrup. Fried fruit. Fruit in cream or butter sauce. Vegetables Creamed or fried vegetables. Vegetables in a cheese sauce. Regular canned vegetables (not low-sodium or reduced-sodium). Regular canned tomato sauce and paste (not low-sodium or reduced-sodium). Regular tomato and vegetable juice (not low-sodium or reduced-sodium). Rosita Fire. Olives. Grains Baked goods made with fat, such as croissants, muffins, or some breads. Dry pasta or rice meal packs. Meats and other proteins Fatty cuts of meat. Ribs. Fried meat. Tomasa Blase.  Bologna, salami, and other precooked or cured meats, such as sausages or meat loaves. Fat from the back of a pig (fatback). Bratwurst. Salted nuts and seeds. Canned beans with added salt. Canned or smoked fish. Whole eggs or egg yolks. Chicken or [place] with skin. Dairy Whole or 2% milk, cream, and half-and-half. Whole or full-fat cream cheese. Whole-fat or sweetened yogurt. Full-fat cheese. Nondairy creamers. Whipped toppings. Processed cheese and cheese spreads. Fats and oils Butter. Stick margarine. Lard. Shortening. Ghee. Bacon fat. Tropical oils, such as coconut, palm kernel, or palm oil. Seasonings and condiments Onion salt, garlic salt, seasoned salt, table salt, and sea salt. Worcestershire sauce. Tartar sauce. Barbecue sauce. Teriyaki sauce. Soy sauce, including reduced-sodium. Steak sauce. Canned and packaged gravies. Fish sauce. Oyster sauce. Cocktail sauce. Store-bought horseradish. Ketchup. Mustard. Meat flavorings and tenderizers. Bouillon cubes. Hot sauces. Pre-made or packaged marinades. Pre-made or packaged taco seasonings. Relishes. Regular salad dressings. Other foods Salted popcorn and pretzels. The items listed above may not be a complete list of foods and beverages you should avoid. Contact a dietitian for more information. Where to find more information  National Heart, Lung, and Blood Institute: PopSteam.is  American Heart Association: www.heart.org  Academy of Nutrition and Dietetics: www.eatright.org  National  Kidney Foundation: www.kidney.org Summary  The DASH eating plan is a healthy eating plan that has been shown to reduce high blood pressure (hypertension). It may also reduce your risk for type 2 diabetes, heart disease, and stroke.  When on the DASH eating plan, aim to eat more fresh fruits and vegetables, whole grains, lean proteins, low-fat dairy, and heart-healthy fats.  With the DASH eating plan, you should limit salt (sodium) intake to 2,300 mg a  day. If you have hypertension, you may need to reduce your sodium intake to 1,500 mg a day.  Work with your health care provider or dietitian to adjust your eating plan to your individual calorie needs. This information is not intended to replace advice given to you by your health care provider. Make sure you discuss any questions you have with your health care provider. Document Revised: 08/29/2019 Document Reviewed: 08/29/2019 Elsevier Patient Education  2021 [employer].

## 2020-11-25 NOTE — Progress Notes (Signed)
Subjective:  Patient ID: Rachael Silva, female    DOB: 05/02/1946  Age: 75 y.o. MRN: [id number]  CC: Acute Visit (Pt c/o elevated BP (210/139) and would like to discuss. )  HPI  Unaccompanied: Elevated BP: Home BP reading 160/100 yesterday with headache. Daughter administered 10mg  of propanolol yesterday. Reports increased stressor at home, taking care of elderly mother. She did not take furosemide for last 3days. Headache has resolved with tylenol and propanolol.  BP Readings from Last 3 Encounters:  11/25/20 (!) 148/60  08/09/20 140/78  05/07/20 (!) 140/76   She is aware Shingrix will not to covered if administered by this office. She opted to have vaccine administered in this office regardless.  Reviewed past Medical, Social and Family history today.  Outpatient Medications Prior to Visit  Medication Sig Dispense Refill  . albuterol (VENTOLIN HFA) 108 (90 Base) MCG/ACT inhaler TAKE 2 PUFFS BY MOUTH EVERY 6 HOURS AS NEEDED FOR WHEEZE OR SHORTNESS OF BREATH 8.5 each 2  . B Complex-C-Folic Acid (STRESS B COMPLEX PO) Take 1 tablet by mouth daily.    . Calcium Carbonate-Vitamin D (CALTRATE 600+D PO) Take 1 tablet by mouth 2 (two) times daily.    . cetirizine (ZYRTEC) 10 MG tablet TAKE 1 TABLET BY MOUTH EVERY DAY 90 tablet 2  . co-enzyme Q-10 30 MG capsule Take 200 mg by mouth daily.    05/09/20 EPIPEN 2-PAK 0.3 MG/0.3ML SOAJ injection See admin instructions.  99  . ezetimibe (ZETIA) 10 MG tablet Take 1 tablet (10 mg total) by mouth daily. 90 tablet 3  . fluticasone furoate-vilanterol (BREO ELLIPTA) 200-25 MCG/INH AEPB Inhale 1 puff into the lungs daily. 1 each 1  . furosemide (LASIX) 20 MG tablet Take 0.5 tablets (10 mg total) by mouth daily. 45 tablet 1  . GARLIC PO Take 1 capsule by mouth daily.    . montelukast (SINGULAIR) 10 MG tablet Take 1 tablet (10 mg total) by mouth daily. 90 tablet 1  . Multiple Vitamins-Minerals (CENTRUM ADULTS PO) Take 1 tablet by mouth daily.    .  naproxen (NAPROSYN) 500 MG tablet Take by mouth.    Marland Kitchen omeprazole (PRILOSEC) 20 MG capsule Take 1 capsule (20 mg total) by mouth daily.    . ONE TOUCH ULTRA TEST test strip USE TO TEST TWICE DAILY DX E09.9 100 each 0  . ONETOUCH DELICA LANCETS 33G MISC USE TO TEST SUGARS TWICE DAILY DX E09.9 100 each 0  . Tiotropium Bromide Monohydrate (SPIRIVA RESPIMAT) 1.25 MCG/ACT AERS TAKE 2 PUFFS BY MOUTH TWICE A DAY 4 g 1  . pregabalin (LYRICA) 25 MG capsule Take 1 capsule (25 mg total) by mouth 2 (two) times daily. (Patient not taking: Reported on 11/25/2020) 180 capsule 1   Facility-Administered Medications Prior to Visit  Medication Dose Route Frequency Provider Last Rate Last Admin  . omalizumab 11/27/2020) injection 375 mg  375 mg Subcutaneous Q14 Days Geoffry Paradise, MD   375 mg at 08/16/17 1249  . omalizumab 13/08/18) injection 375 mg  375 mg Subcutaneous Q14 Days Geoffry Paradise, MD   375 mg at 09/25/17 1638  . omalizumab 09/27/17) injection 375 mg  375 mg Subcutaneous Q14 Days Geoffry Paradise, MD   375 mg at 10/11/17 1625    ROS See HPI  Objective:  BP (!) 148/60 (BP Location: Left Arm, Patient Position: Sitting, Cuff Size: Large)   Pulse 76   Temp (!) 96 F (35.6 C) (Temporal)   Ht 5\' 7"  (1.702 m)  Wt 251 lb 3.2 oz (113.9 kg)   SpO2 97%   BMI 39.34 kg/m   Physical Exam Constitutional:      Appearance: She is obese.  Cardiovascular:     Rate and Rhythm: Normal rate.     Pulses: Normal pulses.  Pulmonary:     Effort: Pulmonary effort is normal.  Neurological:     Mental Status: She is alert and oriented to person, place, and time.    Assessment & Plan:  This visit occurred during the SARS-CoV-2 public health emergency.  Safety protocols were in place, including screening questions prior to the visit, additional usage of staff PPE, and extensive cleaning of exam room while observing appropriate contact time as indicated for disinfecting solutions.   Jarrod was seen today for acute  visit.  Diagnoses and all orders for this visit:  Essential hypertension  Need for shingles vaccine -     Varicella-zoster vaccine IM  Advised to resume furosemide. Monitor BP every morning and record Call office with BP readings on Monday. Maintain DASH diet and adequate oral hydration.  Problem List Items Addressed This Visit      Cardiovascular and Mediastinum   Essential hypertension - Primary    Other Visit Diagnoses    Need for shingles vaccine       Relevant Orders   Varicella-zoster vaccine IM (Completed)      Follow-up: Return if symptoms worsen or fail to improve.  Alysia Penna, NP

## 2020-11-26 ENCOUNTER — Ambulatory Visit (INDEPENDENT_AMBULATORY_CARE_PROVIDER_SITE_OTHER): Payer: Medicare Other

## 2020-11-26 DIAGNOSIS — J45909 Unspecified asthma, uncomplicated: Secondary | ICD-10-CM | POA: Diagnosis not present

## 2020-11-26 MED ORDER — BENRALIZUMAB 30 MG/ML ~~LOC~~ SOSY
30.0000 mg | PREFILLED_SYRINGE | Freq: Once | SUBCUTANEOUS | Status: AC
  Administered 2020-11-26: 30 mg via SUBCUTANEOUS

## 2020-11-26 NOTE — Progress Notes (Signed)
Have you been hospitalized within the last 10 days?  No Do you have a fever?  No Do you have a cough?  No Do you have a headache or sore throat? No  

## 2020-11-29 ENCOUNTER — Telehealth: Payer: Self-pay

## 2020-11-29 NOTE — Telephone Encounter (Signed)
Patient calling to give nurse Bp readings. 11/26/20 AM 119/74 PM 129/77   11/27/20 Middle of the night, pt feeling dizzy/95/67  11/27/20  AM 131/83  PM 121/75  11/28/20 AM 121/79  PM 115/71  11/29/20 AM 121/73 Please advise. CB# [phone]

## 2020-11-29 NOTE — Telephone Encounter (Signed)
She should hold furosemide if BP<110/70.

## 2020-11-30 NOTE — Telephone Encounter (Signed)
Patient notified and verbalized understanding. 

## 2020-12-01 ENCOUNTER — Encounter: Payer: Self-pay | Admitting: Internal Medicine

## 2020-12-01 ENCOUNTER — Other Ambulatory Visit: Payer: Self-pay

## 2020-12-01 ENCOUNTER — Ambulatory Visit (INDEPENDENT_AMBULATORY_CARE_PROVIDER_SITE_OTHER): Payer: Medicare Other | Admitting: Internal Medicine

## 2020-12-01 VITALS — BP 144/82 | HR 75 | Temp 97.3°F | Ht 67.0 in | Wt 248.6 lb

## 2020-12-01 DIAGNOSIS — J4540 Moderate persistent asthma, uncomplicated: Secondary | ICD-10-CM | POA: Diagnosis not present

## 2020-12-01 DIAGNOSIS — Z9989 Dependence on other enabling machines and devices: Secondary | ICD-10-CM | POA: Diagnosis not present

## 2020-12-01 DIAGNOSIS — G4733 Obstructive sleep apnea (adult) (pediatric): Secondary | ICD-10-CM | POA: Diagnosis not present

## 2020-12-01 DIAGNOSIS — J301 Allergic rhinitis due to pollen: Secondary | ICD-10-CM | POA: Diagnosis not present

## 2020-12-01 NOTE — Progress Notes (Signed)
MRN# [id number] Rachael Silva 14-Oct-1945     SYNOPSIS 75 -year-old female past medical history of asthma. Being followed by pulmonary for chronic eosinophilic pneumonia, status post bronchoscopy in December 2016 with negative cultures for AFB and fungus. Transbronchial biopsy-positive for eosinophilic infiltrate. Treated with steroids unable to wean since  October 2015.    CC: Follow up ASTHMA Follow up OSA  HPI   Asthma seems to be well-controlled at this time  History of ABPA previous history of chronic prednisone therapy   Patient has been on Fasenra immunotherapy  This seems to be helping her asthma  Patient currently on Breo 2 puffs twice daily as well as Spiriva  Has intermittent wheezing Patient is still surrounded by cats  No exacerbation at this time No evidence of heart failure at this time No evidence or signs of infection at this time No respiratory distress No fevers, chills, nausea, vomiting, diarrhea No evidence of lower extremity edema No evidence hemoptysis  Chronic shortness of breath and dyspnea exertion seems to be stable at this time  Regarding her obstructive sleep apnea Auto CPAP download February 2022 Compliance report reviewed in detail with patient 83% compliance for days 57% compliance for greater than 4 hours AHI reduced to 0.4 Auto CPAP 11-15      Review of Systems:  Gen:  Denies  fever, sweats, chills weight loss  HEENT: Denies blurred vision, double vision, ear pain, eye pain, hearing loss, nose bleeds, sore throat Cardiac:  No dizziness, chest pain or heaviness, chest tightness,edema, No JVD Resp:   No cough, -sputum production, -shortness of breath,-wheezing, -hemoptysis,  Gi: Denies swallowing difficulty, stomach pain, nausea or vomiting, diarrhea, constipation, bowel incontinence Gu:  Denies bladder incontinence, burning urine Ext:   Denies Joint pain, stiffness or swelling Skin: Denies  skin rash, easy bruising or bleeding  or hives Endoc:  Denies polyuria, polydipsia , polyphagia or weight change Psych:   Denies depression, insomnia or hallucinations  Other:  All other systems negative  BP (!) 144/82 (BP Location: Left Arm, Cuff Size: Normal)   Pulse 75   Temp (!) 97.3 F (36.3 C) (Temporal)   Ht 5\' 7"  (1.702 m)   Wt 248 lb 9.6 oz (112.8 kg)   SpO2 98%   BMI 38.94 kg/m   Physical Examination:   General Appearance: No distress  Neuro:without focal findings,  speech normal,  HEENT: PERRLA, EOM intact.   Pulmonary: normal breath sounds, No wheezing.  CardiovascularNormal S1,S2.  No m/r/g.   Abdomen: Benign, Soft, non-tender. Renal:  No costovertebral tenderness  GU:  Not performed at this time. Endoc: No evident thyromegaly Skin:   warm, no rashes, no ecchymosis  Extremities: normal, no cyanosis, clubbing. PSYCHIATRIC: Mood, affect within normal limits.   ALL OTHER ROS ARE NEGATIVE   Allergies:  Lisinopril, Atorvastatin, and Erythromycin           LABS Results for Rachael Silva, Rachael Silva (MRN [name]) as of 08/01/2016 11:16  Ref. Range 07/02/2014 13:00 08/05/2015 15:12 03/30/2016 15:11  IgE (Immunoglobulin E), Serum Latest Ref Range: 0 - 100 IU/mL 2,401 (H) 1,253 (H) 1,180 (H)   Results for Rachael Silva, Rachael Silva (MRN [name]) as of 08/01/2016 11:16  Ref. Range 08/05/2015 15:12 09/16/2015 12:46 03/30/2016 15:11 05/03/2016 08:42 07/26/2016 15:31  Eosinophil Latest Units: % 10 11 5  5.4 (H) 3        Assessment and Plan:   75 year old pleasant African-American female with follow-up today for severe persistent asthma and underlying obstructive sleep apnea with a history  of chronic eosinophilic pneumonia and ABPA \   Asthma is moderately persistent at this time however it is well controlled  Patient continues to take immunosuppressive therapy with Hassell Halim as prescribed  I have advised to see if Spiriva Respimat is needed and asked to hold this inhaler and assess her asthma  symptoms  Continue albuterol as needed  Patient advised to avoid allergens and secondhand smoke  Recommend avoiding cats   Has diagnosis of sleep apnea I have reviewed her compliance report in detail Over the last several months patient has had a stressful situation with the family and with her mother being sick Her AHI is significantly reduced to 0.4 Patient uses and benefits from auto CPAP therapy  Allergic rhinitis Continue antihistamines Continue Singulair Continue Flonase   History of ABPA No indication for prednisone at this time Bronchoscopy with BAL in 2016 was consistent with eosinophilic pneumonia     MEDICATION ADJUSTMENTS/LABS AND TESTS ORDERED: Continue inhalers as prescribed-plan to hold Spiriva Respimat and assess asthma-like symptoms Continue CPAP as prescribed   avoid allergens and cats    CURRENT MEDICATIONS REVIEWED AT LENGTH WITH PATIENT TODAY   Patient satisfied with Plan of action and management. All questions answered  Follow up in 6 months  Total time spent 32 minutes   Lucie Leather, M.D.  [facility]  Medical Director [facility] Medical Director [facility] Cardio-Pulmonary Department

## 2020-12-01 NOTE — Patient Instructions (Signed)
Continue CPAP as prescribed Continue immunotherapy as prescribed Avoid cats Continue inhalers as prescribed  Consider holding Spiriva and assessing asthma symptoms

## 2020-12-05 ENCOUNTER — Other Ambulatory Visit (HOSPITAL_COMMUNITY): Payer: Self-pay | Admitting: Pharmacy Technician

## 2020-12-05 DIAGNOSIS — J4550 Severe persistent asthma, uncomplicated: Secondary | ICD-10-CM | POA: Insufficient documentation

## 2020-12-08 DIAGNOSIS — J301 Allergic rhinitis due to pollen: Secondary | ICD-10-CM | POA: Diagnosis not present

## 2020-12-15 DIAGNOSIS — J301 Allergic rhinitis due to pollen: Secondary | ICD-10-CM | POA: Diagnosis not present

## 2020-12-22 DIAGNOSIS — J301 Allergic rhinitis due to pollen: Secondary | ICD-10-CM | POA: Diagnosis not present

## 2020-12-28 ENCOUNTER — Other Ambulatory Visit: Payer: Self-pay | Admitting: Internal Medicine

## 2020-12-29 DIAGNOSIS — J301 Allergic rhinitis due to pollen: Secondary | ICD-10-CM | POA: Diagnosis not present

## 2021-01-05 DIAGNOSIS — J301 Allergic rhinitis due to pollen: Secondary | ICD-10-CM | POA: Diagnosis not present

## 2021-01-12 DIAGNOSIS — J301 Allergic rhinitis due to pollen: Secondary | ICD-10-CM | POA: Diagnosis not present

## 2021-01-20 ENCOUNTER — Ambulatory Visit (INDEPENDENT_AMBULATORY_CARE_PROVIDER_SITE_OTHER): Payer: Medicare Other

## 2021-01-20 ENCOUNTER — Other Ambulatory Visit: Payer: Self-pay

## 2021-01-20 VITALS — BP 175/73 | HR 74 | Temp 98.1°F | Resp 16

## 2021-01-20 DIAGNOSIS — J4550 Severe persistent asthma, uncomplicated: Secondary | ICD-10-CM

## 2021-01-20 MED ORDER — SODIUM CHLORIDE 0.9 % IV SOLN: Freq: Once | INTRAVENOUS | Status: DC | PRN

## 2021-01-20 MED ORDER — FAMOTIDINE IN NACL 20-0.9 MG/50ML-% IV SOLN: 20.0000 mg | Freq: Once | INTRAVENOUS | Status: DC | PRN

## 2021-01-20 MED ORDER — METHYLPREDNISOLONE SODIUM SUCC 125 MG IJ SOLR: 125.0000 mg | Freq: Once | INTRAMUSCULAR | Status: DC | PRN

## 2021-01-20 MED ORDER — DIPHENHYDRAMINE HCL 50 MG/ML IJ SOLN: 50.0000 mg | Freq: Once | INTRAMUSCULAR | Status: DC | PRN

## 2021-01-20 MED ORDER — BENRALIZUMAB 30 MG/ML ~~LOC~~ SOSY
30.0000 mg | PREFILLED_SYRINGE | Freq: Once | SUBCUTANEOUS | Status: AC
Start: 2021-01-20 — End: 2021-01-20
  Administered 2021-01-20: 30 mg via SUBCUTANEOUS
  Filled 2021-01-20: qty 1

## 2021-01-20 MED ORDER — ALBUTEROL SULFATE HFA 108 (90 BASE) MCG/ACT IN AERS: 2.0000 | INHALATION_SPRAY | Freq: Once | RESPIRATORY_TRACT | Status: DC | PRN

## 2021-01-20 MED ORDER — EPINEPHRINE 0.3 MG/0.3ML IJ SOAJ: 0.3000 mg | Freq: Once | INTRAMUSCULAR | Status: DC | PRN

## 2021-01-20 NOTE — Progress Notes (Signed)
Diagnosis: Asthma  Provider:  Chilton Greathouse, MD  Procedure: Injection  Fasenra (Benralizumab), Dose: 30 mg, Site: subcutaneous  Discharge: Condition: Good, Destination: Home . AVS provided to patient.   Performed by:  Marilynn Rail, RN

## 2021-01-26 DIAGNOSIS — J301 Allergic rhinitis due to pollen: Secondary | ICD-10-CM | POA: Diagnosis not present

## 2021-01-31 DIAGNOSIS — J301 Allergic rhinitis due to pollen: Secondary | ICD-10-CM | POA: Diagnosis not present

## 2021-02-07 ENCOUNTER — Other Ambulatory Visit: Payer: Self-pay

## 2021-02-08 ENCOUNTER — Encounter: Payer: Self-pay | Admitting: Nurse Practitioner

## 2021-02-08 ENCOUNTER — Ambulatory Visit (INDEPENDENT_AMBULATORY_CARE_PROVIDER_SITE_OTHER): Payer: Medicare Other | Admitting: Nurse Practitioner

## 2021-02-08 VITALS — BP 118/80 | HR 60 | Temp 97.0°F | Ht 66.0 in | Wt 247.6 lb

## 2021-02-08 DIAGNOSIS — E099 Drug or chemical induced diabetes mellitus without complications: Secondary | ICD-10-CM

## 2021-02-08 DIAGNOSIS — Z23 Encounter for immunization: Secondary | ICD-10-CM

## 2021-02-08 DIAGNOSIS — E782 Mixed hyperlipidemia: Secondary | ICD-10-CM | POA: Diagnosis not present

## 2021-02-08 DIAGNOSIS — I10 Essential (primary) hypertension: Secondary | ICD-10-CM | POA: Diagnosis not present

## 2021-02-08 DIAGNOSIS — T380X5S Adverse effect of glucocorticoids and synthetic analogues, sequela: Secondary | ICD-10-CM

## 2021-02-08 NOTE — Assessment & Plan Note (Signed)
BP at goal BP Readings from Last 3 Encounters:  02/08/21 118/80  01/20/21 (!) 175/73  12/01/20 (!) 144/82   Continue current medications Repeat BMP

## 2021-02-08 NOTE — Progress Notes (Signed)
Subjective:  Patient ID: Rachael Silva, female    DOB: July 05, 1946  Age: 75 y.o. MRN: [id number]  CC: Follow-up (6 month f/u on DM, HTN, and Hyperlipidemia. /Pt is not fasting. )  HPI  Essential hypertension BP at goal BP Readings from Last 3 Encounters:  02/08/21 118/80  01/20/21 (!) 175/73  12/01/20 (!) 144/82   Continue current medications Repeat BMP  Steroid-induced diabetes mellitus Repeat hgbA1c Advised to schedule annual diabetic eye exam  Wt Readings from Last 3 Encounters:  02/08/21 247 lb 9.6 oz (112.3 kg)  12/01/20 248 lb 9.6 oz (112.8 kg)  11/25/20 251 lb 3.2 oz (113.9 kg)   Reviewed past Medical, Social and Family history today.  Outpatient Medications Prior to Visit  Medication Sig Dispense Refill  . albuterol (VENTOLIN HFA) 108 (90 Base) MCG/ACT inhaler TAKE 2 PUFFS BY MOUTH EVERY 6 HOURS AS NEEDED FOR WHEEZE OR SHORTNESS OF BREATH 8.5 each 2  . B Complex-C-Folic Acid (STRESS B COMPLEX PO) Take 1 tablet by mouth daily.    . Calcium Carbonate-Vitamin D (CALTRATE 600+D PO) Take 1 tablet by mouth 2 (two) times daily.    . cetirizine (ZYRTEC) 10 MG tablet TAKE 1 TABLET BY MOUTH EVERY DAY 90 tablet 2  . co-enzyme Q-10 30 MG capsule Take 200 mg by mouth daily.    Marland Kitchen EPIPEN 2-PAK 0.3 MG/0.3ML SOAJ injection See admin instructions.  99  . ezetimibe (ZETIA) 10 MG tablet Take 1 tablet (10 mg total) by mouth daily. 90 tablet 3  . fluticasone furoate-vilanterol (BREO ELLIPTA) 200-25 MCG/INH AEPB Inhale 1 puff into the lungs daily. 1 each 1  . furosemide (LASIX) 20 MG tablet Take 0.5 tablets (10 mg total) by mouth daily. 45 tablet 1  . GARLIC PO Take 1 capsule by mouth daily.    . montelukast (SINGULAIR) 10 MG tablet Take 1 tablet (10 mg total) by mouth daily. 90 tablet 1  . Multiple Vitamins-Minerals (CENTRUM ADULTS PO) Take 1 tablet by mouth daily.    Marland Kitchen omeprazole (PRILOSEC) 20 MG capsule Take 1 capsule (20 mg total) by mouth daily.    . ONE TOUCH ULTRA TEST test strip  USE TO TEST TWICE DAILY DX E09.9 100 each 0  . ONETOUCH DELICA LANCETS 33G MISC USE TO TEST SUGARS TWICE DAILY DX E09.9 100 each 0  . pregabalin (LYRICA) 25 MG capsule Take 1 capsule (25 mg total) by mouth 2 (two) times daily. 180 capsule 1  . Tiotropium Bromide Monohydrate (SPIRIVA RESPIMAT) 1.25 MCG/ACT AERS TAKE 2 PUFFS BY MOUTH TWICE A DAY (Patient not taking: Reported on 02/08/2021) 4 g 1   Facility-Administered Medications Prior to Visit  Medication Dose Route Frequency Provider Last Rate Last Admin  . omalizumab Geoffry Paradise) injection 375 mg  375 mg Subcutaneous Q14 Days Erin Fulling, MD   375 mg at 08/16/17 1249  . omalizumab Geoffry Paradise) injection 375 mg  375 mg Subcutaneous Q14 Days Erin Fulling, MD   375 mg at 09/25/17 1638  . omalizumab Geoffry Paradise) injection 375 mg  375 mg Subcutaneous Q14 Days Erin Fulling, MD   375 mg at 10/11/17 1625    ROS See HPI  Objective:  BP 118/80 (BP Location: Left Arm, Patient Position: Sitting, Cuff Size: Large)   Pulse 60   Temp (!) 97 F (36.1 C) (Temporal)   Ht 5\' 6"  (1.676 m)   Wt 247 lb 9.6 oz (112.3 kg)   SpO2 97%   BMI 39.96 kg/m   Physical Exam Constitutional:  Appearance: She is obese.  Cardiovascular:     Rate and Rhythm: Normal rate and regular rhythm.     Pulses: Normal pulses.     Heart sounds: Normal heart sounds.  Pulmonary:     Effort: Pulmonary effort is normal.     Breath sounds: Normal breath sounds.  Musculoskeletal:     Right lower leg: Edema present.     Left lower leg: Edema present.  Neurological:     Mental Status: She is alert and oriented to person, place, and time.    Assessment & Plan:  This visit occurred during the SARS-CoV-2 public health emergency.  Safety protocols were in place, including screening questions prior to the visit, additional usage of staff PPE, and extensive cleaning of exam room while observing appropriate contact time as indicated for disinfecting solutions.   [name] was seen today for  follow-up.  Diagnoses and all orders for this visit:  Essential hypertension -     Basic metabolic panel; Future  Steroid-induced diabetes mellitus, sequela (HCC) -     Hemoglobin A1c; Future  Mixed hyperlipidemia -     Lipid panel; Future  Need for shingles vaccine -     Varicella-zoster vaccine IM    Problem List Items Addressed This Visit      Cardiovascular and Mediastinum   Essential hypertension - Primary    BP at goal BP Readings from Last 3 Encounters:  02/08/21 118/80  01/20/21 (!) 175/73  12/01/20 (!) 144/82   Continue current medications Repeat BMP      Relevant Orders   Basic metabolic panel     Endocrine   Steroid-induced diabetes mellitus (HCC)    Repeat hgbA1c Advised to schedule annual diabetic eye exam      Relevant Orders   Hemoglobin A1c     Other   Hyperlipidemia   Relevant Orders   Lipid panel    Other Visit Diagnoses    Need for shingles vaccine       Relevant Orders   Varicella-zoster vaccine IM (Completed)      Follow-up: Return in about 6 months (around 08/11/2021) for DM and HTN, hyperlipidemia (fasting).  Alysia Penna, NP

## 2021-02-08 NOTE — Patient Instructions (Addendum)
Go to [address] for fasting blood draw You will need to be fasting 6-8hrs prior to blood draw. Ok to drink water.  Maintain current medications  Sign medical release to get records from opthalmology

## 2021-02-08 NOTE — Assessment & Plan Note (Addendum)
Repeat hgbA1c Advised to schedule annual diabetic eye exam

## 2021-02-16 DIAGNOSIS — J301 Allergic rhinitis due to pollen: Secondary | ICD-10-CM | POA: Diagnosis not present

## 2021-02-18 DIAGNOSIS — J301 Allergic rhinitis due to pollen: Secondary | ICD-10-CM | POA: Diagnosis not present

## 2021-02-23 DIAGNOSIS — J301 Allergic rhinitis due to pollen: Secondary | ICD-10-CM | POA: Diagnosis not present

## 2021-02-24 ENCOUNTER — Ambulatory Visit: Payer: Medicare Other

## 2021-03-01 ENCOUNTER — Other Ambulatory Visit (INDEPENDENT_AMBULATORY_CARE_PROVIDER_SITE_OTHER): Payer: Medicare Other

## 2021-03-01 DIAGNOSIS — E099 Drug or chemical induced diabetes mellitus without complications: Secondary | ICD-10-CM | POA: Diagnosis not present

## 2021-03-01 DIAGNOSIS — I10 Essential (primary) hypertension: Secondary | ICD-10-CM

## 2021-03-01 DIAGNOSIS — T380X5S Adverse effect of glucocorticoids and synthetic analogues, sequela: Secondary | ICD-10-CM | POA: Diagnosis not present

## 2021-03-01 DIAGNOSIS — E782 Mixed hyperlipidemia: Secondary | ICD-10-CM

## 2021-03-01 LAB — BASIC METABOLIC PANEL
BUN: 15 mg/dL (ref 6–23)
CO2: 29 mEq/L (ref 19–32)
Calcium: 9.6 mg/dL (ref 8.4–10.5)
Chloride: 104 mEq/L (ref 96–112)
Creatinine, Ser: 0.96 mg/dL (ref 0.40–1.20)
GFR: 58.87 mL/min — ABNORMAL LOW (ref >60.00)
Glucose, Bld: 95 mg/dL (ref 70–99)
Potassium: 3.6 mEq/L (ref 3.5–5.1)
Sodium: 141 mEq/L (ref 135–145)

## 2021-03-01 LAB — LIPID PANEL
Cholesterol: 202 mg/dL — ABNORMAL HIGH (ref 0–200)
HDL: 71.5 mg/dL (ref >39.00)
LDL Cholesterol: 119 mg/dL — ABNORMAL HIGH (ref 0–99)
NonHDL: 130.53
Total CHOL/HDL Ratio: 3
Triglycerides: 58 mg/dL (ref 0.0–149.0)
VLDL: 11.6 mg/dL (ref 0.0–40.0)

## 2021-03-01 LAB — HEMOGLOBIN A1C: Hgb A1c MFr Bld: 6.2 % (ref 4.6–6.5)

## 2021-03-02 DIAGNOSIS — J301 Allergic rhinitis due to pollen: Secondary | ICD-10-CM | POA: Diagnosis not present

## 2021-03-09 DIAGNOSIS — J301 Allergic rhinitis due to pollen: Secondary | ICD-10-CM | POA: Diagnosis not present

## 2021-03-17 ENCOUNTER — Other Ambulatory Visit: Payer: Self-pay

## 2021-03-17 ENCOUNTER — Ambulatory Visit (INDEPENDENT_AMBULATORY_CARE_PROVIDER_SITE_OTHER): Payer: Medicare Other | Admitting: *Deleted

## 2021-03-17 VITALS — BP 153/83 | HR 78 | Temp 98.6°F | Resp 18

## 2021-03-17 DIAGNOSIS — J4550 Severe persistent asthma, uncomplicated: Secondary | ICD-10-CM | POA: Diagnosis not present

## 2021-03-17 MED ORDER — METHYLPREDNISOLONE SODIUM SUCC 125 MG IJ SOLR: 125.0000 mg | Freq: Once | INTRAMUSCULAR | Status: DC | PRN

## 2021-03-17 MED ORDER — ALBUTEROL SULFATE HFA 108 (90 BASE) MCG/ACT IN AERS: 2.0000 | INHALATION_SPRAY | Freq: Once | RESPIRATORY_TRACT | Status: DC | PRN

## 2021-03-17 MED ORDER — BENRALIZUMAB 30 MG/ML ~~LOC~~ SOSY
30.0000 mg | PREFILLED_SYRINGE | Freq: Once | SUBCUTANEOUS | Status: AC
Start: 2021-03-17 — End: 2021-03-17
  Administered 2021-03-17: 30 mg via SUBCUTANEOUS

## 2021-03-17 MED ORDER — FAMOTIDINE IN NACL 20-0.9 MG/50ML-% IV SOLN: 20.0000 mg | Freq: Once | INTRAVENOUS | Status: DC | PRN

## 2021-03-17 MED ORDER — EPINEPHRINE 0.3 MG/0.3ML IJ SOAJ: 0.3000 mg | Freq: Once | INTRAMUSCULAR | Status: DC | PRN

## 2021-03-17 MED ORDER — DIPHENHYDRAMINE HCL 50 MG/ML IJ SOLN: 50.0000 mg | Freq: Once | INTRAMUSCULAR | Status: DC | PRN

## 2021-03-17 MED ORDER — SODIUM CHLORIDE 0.9 % IV SOLN: Freq: Once | INTRAVENOUS | Status: DC | PRN

## 2021-03-17 NOTE — Progress Notes (Signed)
Diagnosis: Asthma  Provider:  Chilton Greathouse, MD  Procedure: Injection  Fasenra (Benralizumab), Dose: 30 mg, Site: Left arm  subcutaneous  Discharge: Condition: Good, Destination: Home . AVS provided to patient.   Performed by:  Forrest Moron, RN

## 2021-03-30 DIAGNOSIS — J301 Allergic rhinitis due to pollen: Secondary | ICD-10-CM | POA: Diagnosis not present

## 2021-04-06 DIAGNOSIS — J301 Allergic rhinitis due to pollen: Secondary | ICD-10-CM | POA: Diagnosis not present

## 2021-04-13 DIAGNOSIS — J301 Allergic rhinitis due to pollen: Secondary | ICD-10-CM | POA: Diagnosis not present

## 2021-04-14 ENCOUNTER — Telehealth: Payer: Self-pay | Admitting: Internal Medicine

## 2021-04-14 DIAGNOSIS — G4733 Obstructive sleep apnea (adult) (pediatric): Secondary | ICD-10-CM

## 2021-04-14 NOTE — Telephone Encounter (Signed)
Spoke to patient, who is requesting new cpap machine. DME: [employer]. According to our records cpap settings are 11-15cm.  Dr. Belia Heman, please advise if okay to order?

## 2021-04-18 NOTE — Telephone Encounter (Signed)
Dr. Kasa, please advise. thanks 

## 2021-04-19 NOTE — Telephone Encounter (Signed)
Order has been placed to [facility] for replacement cpap. Patient is aware and voiced her understanding.  Nothing further needed.

## 2021-04-21 DIAGNOSIS — J301 Allergic rhinitis due to pollen: Secondary | ICD-10-CM | POA: Diagnosis not present

## 2021-04-27 DIAGNOSIS — J301 Allergic rhinitis due to pollen: Secondary | ICD-10-CM | POA: Diagnosis not present

## 2021-05-04 DIAGNOSIS — J301 Allergic rhinitis due to pollen: Secondary | ICD-10-CM | POA: Diagnosis not present

## 2021-05-11 DIAGNOSIS — Z20822 Contact with and (suspected) exposure to covid-19: Secondary | ICD-10-CM | POA: Diagnosis not present

## 2021-05-12 ENCOUNTER — Other Ambulatory Visit: Payer: Self-pay

## 2021-05-12 ENCOUNTER — Ambulatory Visit (INDEPENDENT_AMBULATORY_CARE_PROVIDER_SITE_OTHER): Payer: Medicare Other

## 2021-05-12 VITALS — BP 161/84 | HR 92 | Temp 98.2°F | Resp 18

## 2021-05-12 DIAGNOSIS — J4550 Severe persistent asthma, uncomplicated: Secondary | ICD-10-CM

## 2021-05-12 MED ORDER — EPINEPHRINE 0.3 MG/0.3ML IJ SOAJ: 0.3000 mg | Freq: Once | INTRAMUSCULAR | Status: DC | PRN

## 2021-05-12 MED ORDER — DIPHENHYDRAMINE HCL 50 MG/ML IJ SOLN: 50.0000 mg | Freq: Once | INTRAMUSCULAR | Status: DC | PRN

## 2021-05-12 MED ORDER — SODIUM CHLORIDE 0.9 % IV SOLN: Freq: Once | INTRAVENOUS | Status: DC | PRN

## 2021-05-12 MED ORDER — FAMOTIDINE IN NACL 20-0.9 MG/50ML-% IV SOLN: 20.0000 mg | Freq: Once | INTRAVENOUS | Status: DC | PRN

## 2021-05-12 MED ORDER — ALBUTEROL SULFATE HFA 108 (90 BASE) MCG/ACT IN AERS: 2.0000 | INHALATION_SPRAY | Freq: Once | RESPIRATORY_TRACT | Status: DC | PRN

## 2021-05-12 MED ORDER — BENRALIZUMAB 30 MG/ML ~~LOC~~ SOSY
30.0000 mg | PREFILLED_SYRINGE | Freq: Once | SUBCUTANEOUS | Status: AC
  Administered 2021-05-12: 30 mg via SUBCUTANEOUS
  Filled 2021-05-12: qty 1

## 2021-05-12 MED ORDER — METHYLPREDNISOLONE SODIUM SUCC 125 MG IJ SOLR: 125.0000 mg | Freq: Once | INTRAMUSCULAR | Status: DC | PRN

## 2021-05-12 NOTE — Progress Notes (Signed)
Diagnosis: Asthma  Provider:  Praveen Mannam, MD  Procedure: Injection  Fasenra (Benralizumab), Dose: 30 mg, Site: subcutaneous left arm  Discharge: Condition: Good, Destination: Home . AVS provided to patient.   Performed by:  Callee Rohrig E, LPN        

## 2021-05-13 DIAGNOSIS — J301 Allergic rhinitis due to pollen: Secondary | ICD-10-CM | POA: Diagnosis not present

## 2021-05-18 DIAGNOSIS — J301 Allergic rhinitis due to pollen: Secondary | ICD-10-CM | POA: Diagnosis not present

## 2021-05-25 DIAGNOSIS — J301 Allergic rhinitis due to pollen: Secondary | ICD-10-CM | POA: Diagnosis not present

## 2021-06-01 DIAGNOSIS — J301 Allergic rhinitis due to pollen: Secondary | ICD-10-CM | POA: Diagnosis not present

## 2021-06-08 DIAGNOSIS — J301 Allergic rhinitis due to pollen: Secondary | ICD-10-CM | POA: Diagnosis not present

## 2021-06-15 DIAGNOSIS — J301 Allergic rhinitis due to pollen: Secondary | ICD-10-CM | POA: Diagnosis not present

## 2021-06-22 DIAGNOSIS — J301 Allergic rhinitis due to pollen: Secondary | ICD-10-CM | POA: Diagnosis not present

## 2021-06-24 NOTE — Telephone Encounter (Signed)
Erroneous encounter

## 2021-06-28 ENCOUNTER — Telehealth: Payer: Self-pay | Admitting: Internal Medicine

## 2021-06-28 NOTE — Telephone Encounter (Signed)
Lm for patient to request that she bring SD card to OV.

## 2021-06-29 ENCOUNTER — Ambulatory Visit (INDEPENDENT_AMBULATORY_CARE_PROVIDER_SITE_OTHER): Payer: Medicare Other | Admitting: Internal Medicine

## 2021-06-29 ENCOUNTER — Other Ambulatory Visit: Payer: Self-pay

## 2021-06-29 ENCOUNTER — Encounter: Payer: Self-pay | Admitting: Internal Medicine

## 2021-06-29 VITALS — BP 118/62 | HR 86 | Temp 98.2°F | Ht 67.0 in | Wt 250.8 lb

## 2021-06-29 DIAGNOSIS — G4733 Obstructive sleep apnea (adult) (pediatric): Secondary | ICD-10-CM

## 2021-06-29 DIAGNOSIS — J301 Allergic rhinitis due to pollen: Secondary | ICD-10-CM | POA: Diagnosis not present

## 2021-06-29 DIAGNOSIS — J4520 Mild intermittent asthma, uncomplicated: Secondary | ICD-10-CM | POA: Diagnosis not present

## 2021-06-29 NOTE — Patient Instructions (Signed)
EXCELLENT WORK A+  Avoid allergens(CATS) BREO AS NEEDED ALBUTEROL AS NEEDED  CONTINUE AUTOCPAP as PRESCRIBED

## 2021-06-29 NOTE — Progress Notes (Signed)
MRN# [id number] Rachael Silva [birth year]     SYNOPSIS 75 -year-old female past medical history of asthma. Being followed by pulmonary for chronic eosinophilic pneumonia, status post bronchoscopy in December 2016 with negative cultures for AFB and fungus. Transbronchial biopsy-positive for eosinophilic infiltrate. Treated with steroids unable to wean since  October 2015.    CC: Follow-up asthma Follow-up sleep apnea    HPI  Asthma seems to be well-controlled at this time History of ABPA history of chronic prednisone therapy  Patient patient has been on immune O therapy biological therapy with Katina Dung to be helping her asthma Also on Breo 2 puffs daily with Spiriva Intermittent wheezing Intermittent cough    Has intermittent wheezing Patient is still surrounded by cats   No exacerbation at this time No evidence of heart failure at this time No evidence or signs of infection at this time No respiratory distress No fevers, chills, nausea, vomiting, diarrhea No evidence of lower extremity edema No evidence hemoptysis   Regarding her obstructive sleep apnea Compliance report reviewed in detail with patient Download from April 2022 Excellent compliance report 100% for days 93% for greater than 4 hours  AHI reduced to 0.5 Auto CPAP 11-15       Review of Systems:  Gen:  Denies  fever, sweats, chills weight loss  HEENT: Denies blurred vision, double vision, ear pain, eye pain, hearing loss, nose bleeds, sore throat Cardiac:  No dizziness, chest pain or heaviness, chest tightness,edema, No JVD Resp:   No cough, -sputum production, -shortness of breath,-wheezing, -hemoptysis,  Other:  All other systems negative  BP 118/62 (BP Location: Left Arm, Patient Position: Sitting, Cuff Size: Normal)   Pulse 86   Temp 98.2 F (36.8 C) (Oral)   Ht 5\' 7"  (1.702 m)   Wt 250 lb 12.8 oz (113.8 kg)   SpO2 97%   BMI 39.28 kg/m   Physical Examination:   General  Appearance: No distress  Neuro:without focal findings,  speech normal,  HEENT: PERRLA, EOM intact.   Pulmonary: normal breath sounds, No wheezing.  ALL OTHER ROS ARE NEGATIVE   Allergies:  Lisinopril, Atorvastatin, and Erythromycin           LABS Results for Rachael, Silva (MRN Rachael Silva) as of 08/01/2016 11:16  Ref. Range 07/02/2014 13:00 08/05/2015 15:12 03/30/2016 15:11  IgE (Immunoglobulin E), Serum Latest Ref Range: 0 - 100 IU/mL 2,401 (H) 1,253 (H) 1,180 (H)   Results for Rachael, Silva (MRN Rachael Silva) as of 08/01/2016 11:16  Ref. Range 08/05/2015 15:12 09/16/2015 12:46 03/30/2016 15:11 05/03/2016 08:42 07/26/2016 15:31  Eosinophil Latest Units: % 10 11 5  5.4 (H) 3        Assessment and Plan:   75 year old pleasant African-American female seen today for follow-up for persistent asthma which is now mild in the setting of immunological and biological therapy with a history of chronic eosinophilic pneumonia and ABPA with underlying sleep apnea    Asthma mild to moderate persistent  Well controlled with as needed  Patient stopped respiratory 65  Continue albuterol as needed  Advised to avoid allergens and secondhand smoke   recommend avoiding cats   Sleep apnea  Excellent compliance report  Patient use and benefits from therapy  Continue auto CPAP 11-15  AHI reduced to 0.5   Obesity -recommend significant weight loss -recommend changing diet  Deconditioned state -Recommend increased daily activity and exercise  Allergic rhinitis Continue antihistamines Continue Singulair Continue Flonase   History of ABPA No  indication for prednisone at this time Bronchoscopy with BAL in 2016 was consistent with eosinophilic pneumonia     MEDICATION ADJUSTMENTS/LABS AND TESTS ORDERED: Continue Breo as needed Continue CPAP Avoid cats     Patient  satisfied with Plan of action and management. All questions answered  Follow up  1  year  Total Time Spent 24 mins   Rachael Silva [name], M.D.  [facility]  Medical Director [facility] Medical Director [facility] Cardio-Pulmonary Department

## 2021-07-06 ENCOUNTER — Other Ambulatory Visit: Payer: Self-pay | Admitting: Nurse Practitioner

## 2021-07-06 DIAGNOSIS — E782 Mixed hyperlipidemia: Secondary | ICD-10-CM

## 2021-07-07 ENCOUNTER — Ambulatory Visit (INDEPENDENT_AMBULATORY_CARE_PROVIDER_SITE_OTHER): Payer: Medicare Other

## 2021-07-07 ENCOUNTER — Other Ambulatory Visit: Payer: Self-pay

## 2021-07-07 VITALS — BP 157/79 | HR 79 | Temp 98.8°F | Resp 18 | Ht 67.0 in | Wt 258.0 lb

## 2021-07-07 DIAGNOSIS — J4550 Severe persistent asthma, uncomplicated: Secondary | ICD-10-CM

## 2021-07-07 MED ORDER — FAMOTIDINE IN NACL 20-0.9 MG/50ML-% IV SOLN: 20.0000 mg | Freq: Once | INTRAVENOUS | Status: DC | PRN

## 2021-07-07 MED ORDER — SODIUM CHLORIDE 0.9 % IV SOLN: Freq: Once | INTRAVENOUS | Status: DC | PRN

## 2021-07-07 MED ORDER — METHYLPREDNISOLONE SODIUM SUCC 125 MG IJ SOLR: 125.0000 mg | Freq: Once | INTRAMUSCULAR | Status: DC | PRN

## 2021-07-07 MED ORDER — BENRALIZUMAB 30 MG/ML ~~LOC~~ SOSY
30.0000 mg | PREFILLED_SYRINGE | Freq: Once | SUBCUTANEOUS | Status: AC
  Administered 2021-07-07: 30 mg via SUBCUTANEOUS

## 2021-07-07 MED ORDER — DIPHENHYDRAMINE HCL 50 MG/ML IJ SOLN: 50.0000 mg | Freq: Once | INTRAMUSCULAR | Status: DC | PRN

## 2021-07-07 MED ORDER — EPINEPHRINE 0.3 MG/0.3ML IJ SOAJ: 0.3000 mg | Freq: Once | INTRAMUSCULAR | Status: DC | PRN

## 2021-07-07 MED ORDER — ALBUTEROL SULFATE HFA 108 (90 BASE) MCG/ACT IN AERS: 2.0000 | INHALATION_SPRAY | Freq: Once | RESPIRATORY_TRACT | Status: DC | PRN

## 2021-07-07 NOTE — Progress Notes (Addendum)
Diagnosis: Asthma  Provider:  Praveen Mannam, MD  Procedure: Injection  Fasenra (Benralizumab), Dose: 30 mg, Site: subcutaneous  Discharge: Condition: Good, Destination: Home . AVS provided to patient.   Performed by:  Sherrelle Prochazka E, LPN       

## 2021-07-13 DIAGNOSIS — J301 Allergic rhinitis due to pollen: Secondary | ICD-10-CM | POA: Diagnosis not present

## 2021-07-20 DIAGNOSIS — J301 Allergic rhinitis due to pollen: Secondary | ICD-10-CM | POA: Diagnosis not present

## 2021-07-27 DIAGNOSIS — J301 Allergic rhinitis due to pollen: Secondary | ICD-10-CM | POA: Diagnosis not present

## 2021-08-03 DIAGNOSIS — J309 Allergic rhinitis, unspecified: Secondary | ICD-10-CM | POA: Diagnosis not present

## 2021-08-03 DIAGNOSIS — J45991 Cough variant asthma: Secondary | ICD-10-CM | POA: Diagnosis not present

## 2021-08-04 DIAGNOSIS — J301 Allergic rhinitis due to pollen: Secondary | ICD-10-CM | POA: Diagnosis not present

## 2021-08-09 ENCOUNTER — Other Ambulatory Visit: Payer: Self-pay

## 2021-08-09 ENCOUNTER — Ambulatory Visit (INDEPENDENT_AMBULATORY_CARE_PROVIDER_SITE_OTHER): Payer: Medicare Other

## 2021-08-09 DIAGNOSIS — Z23 Encounter for immunization: Secondary | ICD-10-CM | POA: Diagnosis not present

## 2021-08-09 NOTE — Progress Notes (Signed)
After obtaining informed consent, the influenza immunization was given IM in the left deltoid by Rayhan Groleau W, CMA. Pt tolerated injection well. Pt instructed to report any adverse reactions to me immediately.  

## 2021-08-17 DIAGNOSIS — J301 Allergic rhinitis due to pollen: Secondary | ICD-10-CM | POA: Diagnosis not present

## 2021-08-24 DIAGNOSIS — J301 Allergic rhinitis due to pollen: Secondary | ICD-10-CM | POA: Diagnosis not present

## 2021-08-31 DIAGNOSIS — J301 Allergic rhinitis due to pollen: Secondary | ICD-10-CM | POA: Diagnosis not present

## 2021-09-01 ENCOUNTER — Other Ambulatory Visit: Payer: Self-pay | Admitting: Nurse Practitioner

## 2021-09-01 DIAGNOSIS — I10 Essential (primary) hypertension: Secondary | ICD-10-CM

## 2021-09-06 ENCOUNTER — Telehealth: Payer: Self-pay | Admitting: Nurse Practitioner

## 2021-09-06 NOTE — Telephone Encounter (Signed)
Rx sent to pharmacy   

## 2021-09-06 NOTE — Telephone Encounter (Signed)
Chart supports Rx Last OV 02/08/21 No f/u appointment scheduled.

## 2021-09-06 NOTE — Telephone Encounter (Signed)
What is the name of the medication? furosemide (LASIX) 20 MG tablet [Pharmacy Med Name: FUROSEMIDE 20 MG TABLET] [[id number]]   Have you contacted your pharmacy to request a refill? Yes, she was told to call our office.   Which pharmacy would you like this sent to? [id number], [place] - [address] [place] [address]  Phone:  [phone]  Fax:  [phone]  DEA #:  [id number]   Patient notified that their request is being sent to the clinical staff for review and that they should receive a call once it is complete. If they do not receive a call within 72 hours they can check with their pharmacy or our office.

## 2021-09-07 ENCOUNTER — Ambulatory Visit (INDEPENDENT_AMBULATORY_CARE_PROVIDER_SITE_OTHER): Payer: Medicare Other

## 2021-09-07 ENCOUNTER — Other Ambulatory Visit: Payer: Self-pay

## 2021-09-07 VITALS — BP 181/88 | HR 66 | Temp 98.8°F | Resp 18 | Ht 67.0 in | Wt 254.0 lb

## 2021-09-07 DIAGNOSIS — J4550 Severe persistent asthma, uncomplicated: Secondary | ICD-10-CM | POA: Diagnosis not present

## 2021-09-07 MED ORDER — ALBUTEROL SULFATE HFA 108 (90 BASE) MCG/ACT IN AERS: 2.0000 | INHALATION_SPRAY | Freq: Once | RESPIRATORY_TRACT | Status: DC | PRN

## 2021-09-07 MED ORDER — DIPHENHYDRAMINE HCL 50 MG/ML IJ SOLN: 50.0000 mg | Freq: Once | INTRAMUSCULAR | Status: DC | PRN

## 2021-09-07 MED ORDER — SODIUM CHLORIDE 0.9 % IV SOLN: Freq: Once | INTRAVENOUS | Status: DC | PRN

## 2021-09-07 MED ORDER — EPINEPHRINE 0.3 MG/0.3ML IJ SOAJ: 0.3000 mg | Freq: Once | INTRAMUSCULAR | Status: DC | PRN

## 2021-09-07 MED ORDER — BENRALIZUMAB 30 MG/ML ~~LOC~~ SOSY
30.0000 mg | PREFILLED_SYRINGE | Freq: Once | SUBCUTANEOUS | Status: AC
  Administered 2021-09-07: 30 mg via SUBCUTANEOUS
  Filled 2021-09-07: qty 1

## 2021-09-07 MED ORDER — FAMOTIDINE IN NACL 20-0.9 MG/50ML-% IV SOLN: 20.0000 mg | Freq: Once | INTRAVENOUS | Status: DC | PRN

## 2021-09-07 MED ORDER — METHYLPREDNISOLONE SODIUM SUCC 125 MG IJ SOLR: 125.0000 mg | Freq: Once | INTRAMUSCULAR | Status: DC | PRN

## 2021-09-07 NOTE — Progress Notes (Signed)
Diagnosis: Asthma  Provider:  Praveen Mannam, MD  Procedure: Injection  Fasenra (Benralizumab), Dose: 30 mg, Site: subcutaneous  Discharge: Condition: Good, Destination: Home . AVS provided to patient.   Performed by:  Keyleen Cerrato E, LPN       

## 2021-09-08 ENCOUNTER — Other Ambulatory Visit: Payer: Self-pay | Admitting: Nurse Practitioner

## 2021-09-08 DIAGNOSIS — E782 Mixed hyperlipidemia: Secondary | ICD-10-CM

## 2021-09-14 DIAGNOSIS — J301 Allergic rhinitis due to pollen: Secondary | ICD-10-CM | POA: Diagnosis not present

## 2021-09-21 DIAGNOSIS — J301 Allergic rhinitis due to pollen: Secondary | ICD-10-CM | POA: Diagnosis not present

## 2021-09-23 ENCOUNTER — Encounter: Payer: Self-pay | Admitting: Nurse Practitioner

## 2021-09-23 ENCOUNTER — Telehealth (INDEPENDENT_AMBULATORY_CARE_PROVIDER_SITE_OTHER): Payer: Medicare Other | Admitting: Nurse Practitioner

## 2021-09-23 VITALS — BP 141/84 | HR 93 | Ht 67.0 in | Wt 250.0 lb

## 2021-09-23 DIAGNOSIS — J4541 Moderate persistent asthma with (acute) exacerbation: Secondary | ICD-10-CM

## 2021-09-23 DIAGNOSIS — J0140 Acute pansinusitis, unspecified: Secondary | ICD-10-CM | POA: Diagnosis not present

## 2021-09-23 MED ORDER — AZITHROMYCIN 250 MG PO TABS: 250.0000 mg | ORAL_TABLET | Freq: Every day | ORAL | 0 refills | Status: DC

## 2021-09-23 MED ORDER — PREDNISONE 20 MG PO TABS: ORAL_TABLET | ORAL | 0 refills | Status: AC

## 2021-09-23 NOTE — Progress Notes (Signed)
Virtual Visit via Video Note  I connected withNAME@ on 09/23/21 at 11:30 AM EST by a video enabled telemedicine application and verified that I am speaking with the correct person using two identifiers.  Location: Patient:Home Provider: Office Participants: patient and provider  I discussed the limitations of evaluation and management by telemedicine and the availability of in person appointments. I also discussed with the patient that there may be a patient responsible charge related to this service. The patient expressed understanding and agreed to proceed.  CC:Pt c/o cough, runny nose, and sneezing x 2 weeks. Pt has tried otc medication (Robitussin) and states it has not helped much.   History of Present Illness: Cough This is a new problem. The current episode started 1 to 4 weeks ago. The problem has been gradually worsening. The problem occurs constantly. The cough is Productive of purulent sputum. Associated symptoms include myalgias, nasal congestion, postnasal drip, rhinorrhea, shortness of breath and wheezing. Pertinent negatives include no chest pain, chills, fever, sore throat, sweats or weight loss. The symptoms are aggravated by cold air and exercise. Rachael Silva has tried a beta-agonist inhaler, OTC cough suppressant, steroid inhaler and leukotriene antagonists for the symptoms. The treatment provided mild relief. Her past medical history is significant for asthma.     Observations/Objective: Physical Exam Constitutional:      General: Rachael Silva is not in acute distress. Pulmonary:     Effort: Pulmonary effort is normal.  Neurological:     Mental Status: Rachael Silva is alert and oriented to person, place, and time.    Assessment and Plan: Rachael Silva was seen today for acute visit.  Diagnoses and all orders for this visit:  Moderate persistent chronic asthma with acute exacerbation -     azithromycin (ZITHROMAX Z-PAK) 250 MG tablet; Take 1 tablet (250 mg total) by mouth daily. Take 2tabs on  first day, then 1tab once a day till complete -     predniSONE (DELTASONE) 20 MG tablet; Take 1.5 tablets (30 mg total) by mouth daily with breakfast for 1 day, THEN 1 tablet (20 mg total) daily with breakfast for 2 days, THEN 0.5 tablets (10 mg total) daily with breakfast for 1 day.  Acute non-recurrent pansinusitis -     azithromycin (ZITHROMAX Z-PAK) 250 MG tablet; Take 1 tablet (250 mg total) by mouth daily. Take 2tabs on first day, then 1tab once a day till complete -     predniSONE (DELTASONE) 20 MG tablet; Take 1.5 tablets (30 mg total) by mouth daily with breakfast for 1 day, THEN 1 tablet (20 mg total) daily with breakfast for 2 days, THEN 0.5 tablets (10 mg total) daily with breakfast for 1 day.   Follow Up Instructions: Continue robitussin as needed for cough Start azithromycin and oral prednisone as prescribed Call office if no improvement in 1week.  I discussed the assessment and treatment plan with the patient. The patient was provided an opportunity to ask questions and all were answered. The patient agreed with the plan and demonstrated an understanding of the instructions.   The patient was advised to call back or seek an in-person evaluation if the symptoms worsen or if the condition fails to improve as anticipated.  Alysia Penna, NP

## 2021-09-28 DIAGNOSIS — J301 Allergic rhinitis due to pollen: Secondary | ICD-10-CM | POA: Diagnosis not present

## 2021-10-05 DIAGNOSIS — J301 Allergic rhinitis due to pollen: Secondary | ICD-10-CM | POA: Diagnosis not present

## 2021-10-11 ENCOUNTER — Telehealth: Payer: Self-pay | Admitting: Nurse Practitioner

## 2021-10-11 NOTE — Telephone Encounter (Signed)
Left message for patient to call back and schedule Medicare Annual Wellness Visit (AWV) in office.   If not able to come in office, please offer to do virtually or by telephone.  Left office number and my jabber [phone].  Last AWV:08/19/2020  Please schedule at anytime with Nurse Health Advisor.

## 2021-10-19 DIAGNOSIS — J301 Allergic rhinitis due to pollen: Secondary | ICD-10-CM | POA: Diagnosis not present

## 2021-10-26 DIAGNOSIS — J301 Allergic rhinitis due to pollen: Secondary | ICD-10-CM | POA: Diagnosis not present

## 2021-11-02 ENCOUNTER — Ambulatory Visit: Payer: Medicare Other

## 2021-11-03 ENCOUNTER — Ambulatory Visit (INDEPENDENT_AMBULATORY_CARE_PROVIDER_SITE_OTHER): Payer: Medicare Other

## 2021-11-03 ENCOUNTER — Other Ambulatory Visit: Payer: Self-pay

## 2021-11-03 VITALS — BP 175/80 | HR 69 | Temp 98.8°F | Resp 20 | Ht 67.0 in | Wt 252.6 lb

## 2021-11-03 DIAGNOSIS — J4550 Severe persistent asthma, uncomplicated: Secondary | ICD-10-CM

## 2021-11-03 DIAGNOSIS — J301 Allergic rhinitis due to pollen: Secondary | ICD-10-CM | POA: Diagnosis not present

## 2021-11-03 MED ORDER — BENRALIZUMAB 30 MG/ML ~~LOC~~ SOSY
30.0000 mg | PREFILLED_SYRINGE | Freq: Once | SUBCUTANEOUS | Status: AC
  Administered 2021-11-03: 30 mg via SUBCUTANEOUS
  Filled 2021-11-03: qty 1

## 2021-11-03 MED ORDER — METHYLPREDNISOLONE SODIUM SUCC 125 MG IJ SOLR: 125.0000 mg | Freq: Once | INTRAMUSCULAR | Status: DC | PRN

## 2021-11-03 MED ORDER — EPINEPHRINE 0.3 MG/0.3ML IJ SOAJ: 0.3000 mg | Freq: Once | INTRAMUSCULAR | Status: DC | PRN

## 2021-11-03 MED ORDER — ALBUTEROL SULFATE HFA 108 (90 BASE) MCG/ACT IN AERS: 2.0000 | INHALATION_SPRAY | Freq: Once | RESPIRATORY_TRACT | Status: DC | PRN

## 2021-11-03 MED ORDER — SODIUM CHLORIDE 0.9 % IV SOLN: Freq: Once | INTRAVENOUS | Status: DC | PRN

## 2021-11-03 MED ORDER — DIPHENHYDRAMINE HCL 50 MG/ML IJ SOLN: 50.0000 mg | Freq: Once | INTRAMUSCULAR | Status: DC | PRN

## 2021-11-03 MED ORDER — FAMOTIDINE IN NACL 20-0.9 MG/50ML-% IV SOLN: 20.0000 mg | Freq: Once | INTRAVENOUS | Status: DC | PRN

## 2021-11-03 NOTE — Progress Notes (Signed)
Diagnosis: Asthma  Provider:  Chilton Greathouse, MD  Procedure: Infusion  Benralizumab Harrington Challenger), Dose: 30 mg, Site: subcutaneous, Number of injections: 1  Discharge: Condition: Good, Destination: Home . AVS provided to patient.   Performed by:  Garnette Czech, RN

## 2021-11-07 ENCOUNTER — Other Ambulatory Visit: Payer: Self-pay | Admitting: Pharmacy Technician

## 2021-11-08 ENCOUNTER — Telehealth: Payer: Self-pay | Admitting: Nurse Practitioner

## 2021-11-08 NOTE — Telephone Encounter (Signed)
Left message for patient to call back and schedule Medicare Annual Wellness Visit (AWV) in office.   If not able to come in office, please offer to do virtually or by telephone.  Left office number and my jabber [phone].  Last AWV:08/19/2020  Please schedule at anytime with Nurse Health Advisor.

## 2021-11-09 ENCOUNTER — Other Ambulatory Visit: Payer: Self-pay | Admitting: Internal Medicine

## 2021-11-09 DIAGNOSIS — J301 Allergic rhinitis due to pollen: Secondary | ICD-10-CM | POA: Diagnosis not present

## 2021-11-16 DIAGNOSIS — J301 Allergic rhinitis due to pollen: Secondary | ICD-10-CM | POA: Diagnosis not present

## 2021-11-23 ENCOUNTER — Ambulatory Visit (INDEPENDENT_AMBULATORY_CARE_PROVIDER_SITE_OTHER): Payer: Medicare Other

## 2021-11-23 VITALS — Ht 67.0 in | Wt 250.0 lb

## 2021-11-23 DIAGNOSIS — Z0000 Encounter for general adult medical examination without abnormal findings: Secondary | ICD-10-CM

## 2021-11-23 DIAGNOSIS — Z1211 Encounter for screening for malignant neoplasm of colon: Secondary | ICD-10-CM

## 2021-11-23 DIAGNOSIS — J301 Allergic rhinitis due to pollen: Secondary | ICD-10-CM | POA: Diagnosis not present

## 2021-11-23 NOTE — Patient Instructions (Signed)
Rachael Silva , Thank you for taking time to come for your Medicare Wellness Visit. I appreciate your ongoing commitment to your health goals. Please review the following plan we discussed and let me know if I can assist you in the future.   Screening recommendations/referrals: Colonoscopy: cologuard ordered today Mammogram: patient to schedule Bone Density: completed 09/08/2019 Recommended yearly ophthalmology/optometry visit for glaucoma screening and checkup Recommended yearly dental visit for hygiene and checkup  Vaccinations: Influenza vaccine: completed 08/09/2021, due next flu season Pneumococcal vaccine: completed 05/31/2016 Tdap vaccine: due Shingles vaccine: completed   Covid-19: 12/24/2019, 12/01/2019  Advanced directives: Advance directive discussed with you today.   Conditions/risks identified: none  Next appointment: Follow up in one year for your annual wellness visit    Preventive Care 65 Years and Older, Female Preventive care refers to lifestyle choices and visits with your health care provider that can promote health and wellness. What does preventive care include? A yearly physical exam. This is also called an annual well check. Dental exams once or twice a year. Routine eye exams. Ask your health care provider how often you should have your eyes checked. Personal lifestyle choices, including: Daily care of your teeth and gums. Regular physical activity. Eating a healthy diet. Avoiding tobacco and drug use. Limiting alcohol use. Practicing safe sex. Taking low-dose aspirin every day. Taking vitamin and mineral supplements as recommended by your health care provider. What happens during an annual well check? The services and screenings done by your health care provider during your annual well check will depend on your age, overall health, lifestyle risk factors, and family history of disease. Counseling  Your health care provider may ask you questions about  your: Alcohol use. Tobacco use. Drug use. Emotional well-being. Home and relationship well-being. Sexual activity. Eating habits. History of falls. Memory and ability to understand (cognition). Work and work Statistician. Reproductive health. Screening  You may have the following tests or measurements: Height, weight, and BMI. Blood pressure. Lipid and cholesterol levels. These may be checked every 5 years, or more frequently if you are over 36 years old. Skin check. Lung cancer screening. You may have this screening every year starting at age 74 if you have a 30-pack-year history of smoking and currently smoke or have quit within the past 15 years. Fecal occult blood test (FOBT) of the stool. You may have this test every year starting at age 74. Flexible sigmoidoscopy or colonoscopy. You may have a sigmoidoscopy every 5 years or a colonoscopy every 10 years starting at age 74. Hepatitis C blood test. Hepatitis B blood test. Sexually transmitted disease (STD) testing. Diabetes screening. This is done by checking your blood sugar (glucose) after you have not eaten for a while (fasting). You may have this done every 1-3 years. Bone density scan. This is done to screen for osteoporosis. You may have this done starting at age 74. Mammogram. This may be done every 1-2 years. Talk to your health care provider about how often you should have regular mammograms. Talk with your health care provider about your test results, treatment options, and if necessary, the need for more tests. Talk to your health care provider about how often you should have regular mammograms. Talk with your health care provider about your test results, treatment options, and if necessary, the need for more tests. Vaccines  Your health care provider may recommend certain vaccines, such as: Influenza vaccine. This is recommended every year. Tetanus, diphtheria, and acellular pertussis (Tdap, Td) vaccine. You may need a Td booster every 10 years. Zoster vaccine. You may need this after age 59. Pneumococcal 13-valent conjugate (PCV13) vaccine. One dose is recommended after age 61. Pneumococcal polysaccharide (PPSV23) vaccine.  One dose is recommended after age 2. Talk to your health care provider about which screenings and vaccines you need and how often you need them. This information is not intended to replace advice given to you by your health care provider. Make sure you discuss any questions you have with your health care provider. Document Released: 10/22/2015 Document Revised: 06/14/2016 Document Reviewed: 07/27/2015 Elsevier Interactive Patient Education  2017 [employer] Prevention in the Home Falls can cause injuries. They can happen to people of all ages. There are many things you can do to make your home safe and to help prevent falls. What can I do on the outside of my home? Regularly fix the edges of walkways and driveways and fix any cracks. Remove anything that might make you trip as you walk through a door, such as a raised step or threshold. Trim any bushes or trees on the path to your home. Use bright outdoor lighting. Clear any walking paths of anything that might make someone trip, such as rocks or tools. Regularly check to see if handrails are loose or broken. Make sure that both sides of any steps have handrails. Any raised decks and porches should have guardrails on the edges. Have any leaves, snow, or ice cleared regularly. Use sand or salt on walking paths during winter. Clean up any spills in your garage right away. This includes oil or grease spills. What can I do in the bathroom? Use night lights. Install grab bars by the toilet and in the tub and shower. Do not use towel bars as grab bars. Use non-skid mats or decals in the tub or shower. If you need to sit down in the shower, use a plastic, non-slip stool. Keep the floor dry. Clean up any water that spills on the floor as soon as it happens. Remove soap buildup in the tub or shower regularly. Attach bath mats securely with double-sided non-slip rug tape. Do not have throw rugs and other things on the floor that can make  you trip. What can I do in the bedroom? Use night lights. Make sure that you have a light by your bed that is easy to reach. Do not use any sheets or blankets that are too big for your bed. They should not hang down onto the floor. Have a firm chair that has side arms. You can use this for support while you get dressed. Do not have throw rugs and other things on the floor that can make you trip. What can I do in the kitchen? Clean up any spills right away. Avoid walking on wet floors. Keep items that you use a lot in easy-to-reach places. If you need to reach something above you, use a strong step stool that has a grab bar. Keep electrical cords out of the way. Do not use floor polish or wax that makes floors slippery. If you must use wax, use non-skid floor wax. Do not have throw rugs and other things on the floor that can make you trip. What can I do with my stairs? Do not leave any items on the stairs. Make sure that there are handrails on both sides of the stairs and use them. Fix handrails that are broken or loose. Make sure that handrails are as long as the stairways. Check any carpeting to make sure that it is firmly attached to the stairs. Fix any carpet that is loose or worn. Avoid having throw rugs at the top or bottom of the  stairs. If you do have throw rugs, attach them to the floor with carpet tape. Make sure that you have a light switch at the top of the stairs and the bottom of the stairs. If you do not have them, ask someone to add them for you. What else can I do to help prevent falls? Wear shoes that: Do not have high heels. Have rubber bottoms. Are comfortable and fit you well. Are closed at the toe. Do not wear sandals. If you use a stepladder: Make sure that it is fully opened. Do not climb a closed stepladder. Make sure that both sides of the stepladder are locked into place. Ask someone to hold it for you, if possible. Clearly mark and make sure that you can  see: Any grab bars or handrails. First and last steps. Where the edge of each step is. Use tools that help you move around (mobility aids) if they are needed. These include: Canes. Walkers. Scooters. Crutches. Turn on the lights when you go into a dark area. Replace any light bulbs as soon as they burn out. Set up your furniture so you have a clear path. Avoid moving your furniture around. If any of your floors are uneven, fix them. If there are any pets around you, be aware of where they are. Review your medicines with your doctor. Some medicines can make you feel dizzy. This can increase your chance of falling. Ask your doctor what other things that you can do to help prevent falls. This information is not intended to replace advice given to you by your health care provider. Make sure you discuss any questions you have with your health care provider. Document Released: 07/22/2009 Document Revised: 03/02/2016 Document Reviewed: 10/30/2014 Elsevier Interactive Patient Education  2017 [employer].

## 2021-11-23 NOTE — Progress Notes (Signed)
I connected with Rachael Silva today by telephone and verified that I am speaking with the correct person using two identifiers. Location patient: home Location provider: work Persons participating in the virtual visit: Alisa, Krein LPN.   I discussed the limitations, risks, security and privacy concerns of performing an evaluation and management service by telephone and the availability of in person appointments. I also discussed with the patient that there may be a patient responsible charge related to this service. The patient expressed understanding and verbally consented to this telephonic visit.    Interactive audio and video telecommunications were attempted between this provider and patient, however failed, due to patient having technical difficulties OR patient did not have access to video capability.  We continued and completed visit with audio only.     Vital signs may be patient reported or missing.  Subjective:   Rachael Silva is a 76 y.o. female who presents for Medicare Annual (Subsequent) preventive examination.  Review of Systems     Cardiac Risk Factors include: advanced age (>77men, >47 women);hypertension;obesity (BMI >30kg/m2)     Objective:    Today's Vitals   11/23/21 1516  Weight: 250 lb (113.4 kg)  Height: 5\' 7"  (1.702 m)   Body mass index is 39.16 kg/m.  Advanced Directives 11/23/2021 08/19/2020 08/06/2019 07/01/2018 02/25/2018 02/15/2018 11/07/2017  Does Patient Have a Medical Advance Directive? No No No No No No No  Would patient like information on creating a medical advance directive? - No - Patient declined No - Patient declined No - Patient declined No - Patient declined No - Patient declined No - Patient declined    Current Medications (verified) Outpatient Encounter Medications as of 11/23/2021  Medication Sig   albuterol (VENTOLIN HFA) 108 (90 Base) MCG/ACT inhaler TAKE 2 PUFFS BY MOUTH EVERY 6 HOURS AS NEEDED FOR WHEEZE OR  SHORTNESS OF BREATH   B Complex-C-Folic Acid (STRESS B COMPLEX PO) Take 1 tablet by mouth daily.   BREO ELLIPTA 200-25 MCG/ACT AEPB TAKE 1 PUFF BY MOUTH EVERY DAY   Calcium Carbonate-Vitamin D (CALTRATE 600+D PO) Take 1 tablet by mouth 2 (two) times daily.   cetirizine (ZYRTEC) 10 MG tablet TAKE 1 TABLET BY MOUTH EVERY DAY   co-enzyme Q-10 30 MG capsule Take 200 mg by mouth daily.   EPIPEN 2-PAK 0.3 MG/0.3ML SOAJ injection See admin instructions.   ezetimibe (ZETIA) 10 MG tablet TAKE 1 TABLET BY MOUTH EVERY DAY   furosemide (LASIX) 20 MG tablet TAKE 1/2 TABLET BY MOUTH DAILY   GARLIC PO Take 1 capsule by mouth daily.   montelukast (SINGULAIR) 10 MG tablet Take 1 tablet (10 mg total) by mouth daily.   Multiple Vitamins-Minerals (CENTRUM ADULTS PO) Take 1 tablet by mouth daily.   omeprazole (PRILOSEC) 20 MG capsule Take 1 capsule (20 mg total) by mouth daily.   azithromycin (ZITHROMAX Z-PAK) 250 MG tablet Take 1 tablet (250 mg total) by mouth daily. Take 2tabs on first day, then 1tab once a day till complete (Patient not taking: Reported on 11/23/2021)   ONE TOUCH ULTRA TEST test strip USE TO TEST TWICE DAILY DX E09.9 (Patient not taking: Reported on [id number])   ONETOUCH DELICA LANCETS [id number] MISC USE TO TEST SUGARS TWICE DAILY DX E09.9 (Patient not taking: Reported on 11/23/2021)   pregabalin (LYRICA) 25 MG capsule Take 1 capsule (25 mg total) by mouth 2 (two) times daily. (Patient not taking: Reported on 11/23/2021)   Tiotropium Bromide Monohydrate (SPIRIVA RESPIMAT) 1.25 MCG/ACT AERS  TAKE 2 PUFFS BY MOUTH TWICE A DAY (Patient not taking: Reported on 11/23/2021)   Facility-Administered Encounter Medications as of 11/23/2021  Medication   omalizumab Arvid Right) injection 375 mg   omalizumab Arvid Right) injection 375 mg   omalizumab Arvid Right) injection 375 mg    Allergies (verified) Lisinopril, Atorvastatin, and Erythromycin   History: Past Medical History:  Diagnosis Date   Allergy    allergic  rhinitis   Angioedema 02/2006   Arthritis    OA of knees   Asthma    Complication of anesthesia    apnea- sleep   Frequent headaches    Hiatal hernia    Hypertension    Hypothyroid    Labile blood pressure    Lower extremity edema    Lung disease    Pneumonia    Pneumonia, eosinophilic (HCC)    Shortness of breath dyspnea    Sleep apnea    Urinary incontinence    Wheezing    Past Surgical History:  Procedure Laterality Date   BREAST BIOPSY Left 2013   NEG   BREAST SURGERY  1966   breast biopsy   CESAREAN SECTION     x2   FLEXIBLE BRONCHOSCOPY N/A 09/16/2015   Procedure: FLEXIBLE BRONCHOSCOPY WITH FLURO ;  Surgeon: Vilinda Boehringer, MD;  Location: [facility] ORS;  Service: Pulmonary;  Laterality: N/A;   Family History  Problem Relation Age of Onset   Diabetes Mother        pre-diabetic   Heart disease Mother        ? heart disease   Emphysema Mother    Thyroid disease Mother    Diabetes Maternal Uncle    Heart disease Father    Breast cancer Neg Hx    Social History   Socioeconomic History   Marital status: Single    Spouse name: Not on file   Number of children: 2   Years of education: college   Highest education level: Not on file  Occupational History   Occupation: retired    Fish farm manager: RETIRED  Tobacco Use   Smoking status: Never   Smokeless tobacco: Never  Vaping Use   Vaping Use: Never used  Substance and Sexual Activity   Alcohol use: No    Alcohol/week: 0.0 standard drinks   Drug use: No   Sexual activity: Never  Other Topics Concern   Not on file  Social History Narrative   Lives alone   Left handed    Caffeine use: 1 cup per day   Social Determinants of Health   Financial Resource Strain: Low Risk    Difficulty of Paying Living Expenses: Not hard at all  Food Insecurity: No Food Insecurity   Worried About Charity fundraiser in the Last Year: Never true   [place] in the Last Year: Never true  Transportation Needs: No Transportation  Needs   Lack of Transportation (Medical): No   Lack of Transportation (Non-Medical): No  Physical Activity: Inactive   Days of Exercise per Week: 0 days   Minutes of Exercise per Session: 0 min  Stress: No Stress Concern Present   Feeling of Stress : Not at all  Social Connections: Not on file    Tobacco Counseling Counseling given: Not Answered   Clinical Intake:  Pre-visit preparation completed: Yes  Pain : No/denies pain     Nutritional Status: BMI > 30  Obese Nutritional Risks: None Diabetes: No  How often do you need to have someone help  you when you read instructions, pamphlets, or other written materials from your doctor or pharmacy?: 1 - Never What is the last grade level you completed in school?: college  Diabetic? no  Interpreter Needed?: No  Information entered by :: NAllen LPN   Activities of Daily Living In your present state of health, do you have any difficulty performing the following activities: 11/23/2021  Hearing? N  Comment has hearing aide  Vision? N  Difficulty concentrating or making decisions? N  Walking or climbing stairs? N  Dressing or bathing? N  Doing errands, shopping? N  Preparing Food and eating ? N  Using the Toilet? N  In the past six months, have you accidently leaked urine? N  Do you have problems with loss of bowel control? N  Managing your Medications? N  Managing your Finances? N  Housekeeping or managing your Housekeeping? N  Some recent data might be hidden    Patient Care Team: Nche, Charlene Brooke, NP as PCP - General (Internal Medicine) Marlowe Sax, MD as Referring Physician (Internal Medicine) Nche, Charlene Brooke, NP as Nurse Practitioner (Internal Medicine)  Indicate any recent Medical Services you may have received from other than [facility] providers in the past year (date may be approximate).     Assessment:   This is a routine wellness examination for Rachael Silva.  Hearing/Vision screen Vision  Screening - Comments:: Regular eye exams, [employer]  Dietary issues and exercise activities discussed: Current Exercise Habits: The patient does not participate in regular exercise at present   Goals Addressed             This Visit's Progress    Patient Stated       11/23/2021, wants to lose 20 pounds by next year       Depression Screen PHQ 2/9 Scores 11/23/2021 08/19/2020 12/23/2019 08/06/2019 06/24/2019 11/07/2017 10/19/2017  PHQ - 2 Score 0 0 0 0 0 0 0  Exception Documentation - - - - - - -    Fall Risk Fall Risk  11/23/2021 09/23/2021 08/19/2020 08/09/2020 12/23/2019  Falls in the past year? 0 0 0 0 0  Number falls in past yr: - 0 0 - 0  Injury with Fall? - 0 0 - 0  Risk for fall due to : Medication side effect No Fall Risks - - -  Follow up Falls evaluation completed;Education provided;Falls prevention discussed Falls evaluation completed Falls prevention discussed - -    FALL RISK PREVENTION PERTAINING TO THE HOME:  Any stairs in or around the home? Yes  If so, are there any without handrails? No  Home free of loose throw rugs in walkways, pet beds, electrical cords, etc? Yes  Adequate lighting in your home to reduce risk of falls? Yes   ASSISTIVE DEVICES UTILIZED TO PREVENT FALLS:  Life alert? No  Use of a cane, walker or w/c? Yes  Grab bars in the bathroom? No  Shower chair or bench in shower? No  Elevated toilet seat or a handicapped toilet? Yes   TIMED UP AND GO:  Was the test performed? No .       Cognitive Function: MMSE - Mini Mental State Exam 11/07/2017 05/31/2016  Orientation to time 5 5  Orientation to Place 5 5  Registration 3 3  Attention/ Calculation 5 0  Recall 3 2  Recall-comments - pt was unable to recall 1 of 3 words  Language- name 2 objects 2 0  Language- repeat 1 1  Language- follow 3  step command 3 3  Language- read & follow direction 1 0  Write a sentence 1 0  Copy design 1 0  Total score 30 19     6CIT Screen 11/23/2021   What Year? 0 points  What month? 0 points  What time? 0 points  Count back from 20 0 points  Months in reverse 0 points  Repeat phrase 4 points  Total Score 4    Immunizations Immunization History  Administered Date(s) Administered   Fluad Quad(high Dose 65+) 06/10/2019, 08/09/2020, 08/09/2021   Influenza Split 07/27/2011, 08/19/2012   Influenza Whole 08/01/2007, 07/13/2008, 07/20/2009, 07/20/2010   Influenza, High Dose Seasonal PF 07/16/2018   Influenza,inj,Quad PF,6+ Mos 07/29/2013, 07/15/2014, 07/09/2015, 07/14/2016, 06/19/2017   PFIZER(Purple Top)SARS-COV-2 Vaccination 12/01/2019, 12/24/2019   Pneumococcal Conjugate-13 05/31/2016   Pneumococcal Polysaccharide-23 10/09/2001, 01/13/2014   Td 10/10/2003   Zoster Recombinat (Shingrix) 11/25/2020, 02/08/2021    TDAP status: Due, Education has been provided regarding the importance of this vaccine. Advised may receive this vaccine at local pharmacy or Health Dept. Aware to provide a copy of the vaccination record if obtained from local pharmacy or Health Dept. Verbalized acceptance and understanding.  Flu Vaccine status: Up to date  Pneumococcal vaccine status: Up to date  Covid-19 vaccine status: Completed vaccines  Qualifies for Shingles Vaccine? Yes   Zostavax completed No   Shingrix Completed?: Yes  Screening Tests Health Maintenance  Topic Date Due   TETANUS/TDAP  10/09/2013   COLONOSCOPY (Pts 45-35yrs Insurance coverage will need to be confirmed)  07/20/2018   MAMMOGRAM  11/15/2020   FOOT EXAM  12/22/2020   OPHTHALMOLOGY EXAM  01/26/2021   URINE MICROALBUMIN  08/09/2021   HEMOGLOBIN A1C  09/01/2021   COVID-19 Vaccine (3 - Booster for Pfizer series) 12/09/2021 (Originally 02/18/2020)   Pneumonia Vaccine 80+ Years old  Completed   INFLUENZA VACCINE  Completed   DEXA SCAN  Completed   Hepatitis C Screening  Completed   Zoster Vaccines- Shingrix  Completed   HPV VACCINES  Aged Out    Health  Maintenance  Health Maintenance Due  Topic Date Due   TETANUS/TDAP  10/09/2013   COLONOSCOPY (Pts 45-77yrs Insurance coverage will need to be confirmed)  07/20/2018   MAMMOGRAM  11/15/2020   FOOT EXAM  12/22/2020   OPHTHALMOLOGY EXAM  01/26/2021   URINE MICROALBUMIN  08/09/2021   HEMOGLOBIN A1C  09/01/2021    Colorectal cancer screening: cologuard ordered today  Mammogram status: patient to schedule  Bone Density status: Completed 09/08/2019.   Lung Cancer Screening: (Low Dose CT Chest recommended if Age 53-80 years, 30 pack-year currently smoking OR have quit w/in 15years.) does not qualify.   Lung Cancer Screening Referral: no  Additional Screening:  Hepatitis C Screening: does qualify; Completed 05/31/2016  Vision Screening: Recommended annual ophthalmology exams for early detection of glaucoma and other disorders of the eye. Is the patient up to date with their annual eye exam?  Yes  Who is the provider or what is the name of the office in which the patient attends annual eye exams? [place] If pt is not established with a provider, would they like to be referred to a provider to establish care? No .   Dental Screening: Recommended annual dental exams for proper oral hygiene  Community Resource Referral / Chronic Care Management: CRR required this visit?  No   CCM required this visit?  No      Plan:     I have personally reviewed  and noted the following in the patients chart:   Medical and social history Use of alcohol, tobacco or illicit drugs  Current medications and supplements including opioid prescriptions.  Functional ability and status Nutritional status Physical activity Advanced directives List of other physicians Hospitalizations, surgeries, and ER visits in previous 12 months Vitals Screenings to include cognitive, depression, and falls Referrals and appointments  In addition, I have reviewed and discussed with patient certain preventive  protocols, quality metrics, and best practice recommendations. A written personalized care plan for preventive services as well as general preventive health recommendations were provided to patient.     Kellie Simmering, LPN   [id number]   Nurse Notes: none  Due to this being a virtual visit, the after visit summary with patients personalized plan was offered to patient via mail or my-chart. Patient would like to access on my-chart

## 2021-11-30 DIAGNOSIS — J301 Allergic rhinitis due to pollen: Secondary | ICD-10-CM | POA: Diagnosis not present

## 2021-12-07 DIAGNOSIS — J301 Allergic rhinitis due to pollen: Secondary | ICD-10-CM | POA: Diagnosis not present

## 2021-12-08 ENCOUNTER — Other Ambulatory Visit: Payer: Self-pay | Admitting: Nurse Practitioner

## 2021-12-08 DIAGNOSIS — Z1231 Encounter for screening mammogram for malignant neoplasm of breast: Secondary | ICD-10-CM

## 2021-12-14 DIAGNOSIS — J301 Allergic rhinitis due to pollen: Secondary | ICD-10-CM | POA: Diagnosis not present

## 2021-12-14 DIAGNOSIS — Z1211 Encounter for screening for malignant neoplasm of colon: Secondary | ICD-10-CM | POA: Diagnosis not present

## 2021-12-22 LAB — COLOGUARD: COLOGUARD: NEGATIVE

## 2021-12-28 ENCOUNTER — Other Ambulatory Visit: Payer: Self-pay

## 2021-12-28 ENCOUNTER — Ambulatory Visit (INDEPENDENT_AMBULATORY_CARE_PROVIDER_SITE_OTHER): Payer: Medicare Other

## 2021-12-28 VITALS — BP 180/84 | HR 72 | Temp 98.2°F | Resp 18 | Ht 67.0 in | Wt 253.2 lb

## 2021-12-28 DIAGNOSIS — J4550 Severe persistent asthma, uncomplicated: Secondary | ICD-10-CM | POA: Diagnosis not present

## 2021-12-28 MED ORDER — BENRALIZUMAB 30 MG/ML ~~LOC~~ SOSY
30.0000 mg | PREFILLED_SYRINGE | Freq: Once | SUBCUTANEOUS | Status: AC
  Administered 2021-12-28: 30 mg via SUBCUTANEOUS

## 2021-12-28 NOTE — Progress Notes (Signed)
Diagnosis: Asthma  Provider:  Praveen Mannam, MD  Procedure: Injection  Fasenra (Benralizumab), Dose: 30 mg, Site: subcutaneous, Number of injections: 1  Discharge: Condition: Good, Destination: Home . AVS provided to patient.   Performed by:  Dunbar Buras, RN       

## 2022-01-04 DIAGNOSIS — J301 Allergic rhinitis due to pollen: Secondary | ICD-10-CM | POA: Diagnosis not present

## 2022-01-04 DIAGNOSIS — Z20822 Contact with and (suspected) exposure to covid-19: Secondary | ICD-10-CM | POA: Diagnosis not present

## 2022-01-11 DIAGNOSIS — J301 Allergic rhinitis due to pollen: Secondary | ICD-10-CM | POA: Diagnosis not present

## 2022-01-13 ENCOUNTER — Other Ambulatory Visit: Payer: Self-pay | Admitting: Internal Medicine

## 2022-01-16 ENCOUNTER — Ambulatory Visit
Admission: RE | Admit: 2022-01-16 | Discharge: 2022-01-16 | Disposition: A | Discharge status: Home / Self Care | Payer: Medicare Other | Source: Ambulatory Visit | Attending: Nurse Practitioner | Admitting: Nurse Practitioner

## 2022-01-16 DIAGNOSIS — Z1231 Encounter for screening mammogram for malignant neoplasm of breast: Secondary | ICD-10-CM | POA: Diagnosis not present

## 2022-01-18 DIAGNOSIS — J301 Allergic rhinitis due to pollen: Secondary | ICD-10-CM | POA: Diagnosis not present

## 2022-01-24 DIAGNOSIS — J301 Allergic rhinitis due to pollen: Secondary | ICD-10-CM | POA: Diagnosis not present

## 2022-01-25 ENCOUNTER — Other Ambulatory Visit: Payer: Self-pay | Admitting: Internal Medicine

## 2022-01-25 ENCOUNTER — Other Ambulatory Visit: Payer: Self-pay | Admitting: Nurse Practitioner

## 2022-01-25 DIAGNOSIS — J301 Allergic rhinitis due to pollen: Secondary | ICD-10-CM | POA: Diagnosis not present

## 2022-01-25 DIAGNOSIS — I10 Essential (primary) hypertension: Secondary | ICD-10-CM

## 2022-02-01 DIAGNOSIS — J301 Allergic rhinitis due to pollen: Secondary | ICD-10-CM | POA: Diagnosis not present

## 2022-02-06 DIAGNOSIS — Z20822 Contact with and (suspected) exposure to covid-19: Secondary | ICD-10-CM | POA: Diagnosis not present

## 2022-02-08 DIAGNOSIS — J301 Allergic rhinitis due to pollen: Secondary | ICD-10-CM | POA: Diagnosis not present

## 2022-02-15 DIAGNOSIS — J301 Allergic rhinitis due to pollen: Secondary | ICD-10-CM | POA: Diagnosis not present

## 2022-02-22 ENCOUNTER — Ambulatory Visit (INDEPENDENT_AMBULATORY_CARE_PROVIDER_SITE_OTHER): Payer: Medicare Other

## 2022-02-22 VITALS — BP 157/87 | HR 85 | Temp 98.0°F | Resp 18 | Ht 67.0 in | Wt 253.2 lb

## 2022-02-22 DIAGNOSIS — J4550 Severe persistent asthma, uncomplicated: Secondary | ICD-10-CM | POA: Diagnosis not present

## 2022-02-22 MED ORDER — BENRALIZUMAB 30 MG/ML ~~LOC~~ SOSY
30.0000 mg | PREFILLED_SYRINGE | Freq: Once | SUBCUTANEOUS | Status: AC
  Administered 2022-02-22: 30 mg via SUBCUTANEOUS
  Filled 2022-02-22: qty 1

## 2022-02-22 NOTE — Progress Notes (Signed)
Diagnosis: Asthma  Provider:  Praveen Mannam, MD  Procedure: Injection  Fasenra (Benralizumab), Dose: 30 mg, Site: subcutaneous, Number of injections: 1  Discharge: Condition: Good, Destination: Home . AVS provided to patient.   Performed by:  Lilyannah Zuelke, RN       

## 2022-03-01 DIAGNOSIS — J301 Allergic rhinitis due to pollen: Secondary | ICD-10-CM | POA: Diagnosis not present

## 2022-03-08 DIAGNOSIS — J301 Allergic rhinitis due to pollen: Secondary | ICD-10-CM | POA: Diagnosis not present

## 2022-03-15 DIAGNOSIS — J301 Allergic rhinitis due to pollen: Secondary | ICD-10-CM | POA: Diagnosis not present

## 2022-03-22 DIAGNOSIS — J301 Allergic rhinitis due to pollen: Secondary | ICD-10-CM | POA: Diagnosis not present

## 2022-03-29 DIAGNOSIS — J301 Allergic rhinitis due to pollen: Secondary | ICD-10-CM | POA: Diagnosis not present

## 2022-04-05 DIAGNOSIS — J301 Allergic rhinitis due to pollen: Secondary | ICD-10-CM | POA: Diagnosis not present

## 2022-04-12 DIAGNOSIS — J301 Allergic rhinitis due to pollen: Secondary | ICD-10-CM | POA: Diagnosis not present

## 2022-04-17 DIAGNOSIS — J301 Allergic rhinitis due to pollen: Secondary | ICD-10-CM | POA: Diagnosis not present

## 2022-04-19 ENCOUNTER — Ambulatory Visit (INDEPENDENT_AMBULATORY_CARE_PROVIDER_SITE_OTHER): Payer: Medicare Other

## 2022-04-19 VITALS — BP 154/85 | HR 84 | Temp 98.4°F | Resp 20 | Ht 66.0 in | Wt 252.6 lb

## 2022-04-19 DIAGNOSIS — J4550 Severe persistent asthma, uncomplicated: Secondary | ICD-10-CM

## 2022-04-19 MED ORDER — BENRALIZUMAB 30 MG/ML ~~LOC~~ SOSY
30.0000 mg | PREFILLED_SYRINGE | Freq: Once | SUBCUTANEOUS | Status: AC
  Administered 2022-04-19: 30 mg via SUBCUTANEOUS
  Filled 2022-04-19: qty 1

## 2022-04-19 NOTE — Progress Notes (Addendum)
Diagnosis: Asthma  Provider:  Praveen Mannam, MD  Procedure: Injection  Fasenra (Benralizumab), Dose: 30 mg, Site: subcutaneous, Number of injections: 1  Discharge: Condition: Good, Destination: Home . AVS provided to patient.   Performed by:  Jeyren Danowski, RN       

## 2022-04-24 DIAGNOSIS — J301 Allergic rhinitis due to pollen: Secondary | ICD-10-CM | POA: Diagnosis not present

## 2022-05-01 DIAGNOSIS — J301 Allergic rhinitis due to pollen: Secondary | ICD-10-CM | POA: Diagnosis not present

## 2022-05-08 DIAGNOSIS — J301 Allergic rhinitis due to pollen: Secondary | ICD-10-CM | POA: Diagnosis not present

## 2022-05-13 DIAGNOSIS — S8392XA Sprain of unspecified site of left knee, initial encounter: Secondary | ICD-10-CM | POA: Diagnosis not present

## 2022-05-13 DIAGNOSIS — Y33XXXA Other specified events, undetermined intent, initial encounter: Secondary | ICD-10-CM | POA: Diagnosis not present

## 2022-05-13 DIAGNOSIS — Y9289 Other specified places as the place of occurrence of the external cause: Secondary | ICD-10-CM | POA: Diagnosis not present

## 2022-05-13 DIAGNOSIS — M79605 Pain in left leg: Secondary | ICD-10-CM | POA: Diagnosis not present

## 2022-05-13 DIAGNOSIS — W19XXXA Unspecified fall, initial encounter: Secondary | ICD-10-CM | POA: Diagnosis not present

## 2022-05-13 DIAGNOSIS — M79604 Pain in right leg: Secondary | ICD-10-CM | POA: Diagnosis not present

## 2022-05-13 DIAGNOSIS — J45909 Unspecified asthma, uncomplicated: Secondary | ICD-10-CM | POA: Diagnosis not present

## 2022-05-13 DIAGNOSIS — K219 Gastro-esophageal reflux disease without esophagitis: Secondary | ICD-10-CM | POA: Diagnosis not present

## 2022-05-13 DIAGNOSIS — M79662 Pain in left lower leg: Secondary | ICD-10-CM | POA: Diagnosis not present

## 2022-05-15 ENCOUNTER — Other Ambulatory Visit: Payer: Self-pay | Admitting: Pulmonary Disease

## 2022-05-24 DIAGNOSIS — J301 Allergic rhinitis due to pollen: Secondary | ICD-10-CM | POA: Diagnosis not present

## 2022-05-29 DIAGNOSIS — J301 Allergic rhinitis due to pollen: Secondary | ICD-10-CM | POA: Diagnosis not present

## 2022-06-05 DIAGNOSIS — J301 Allergic rhinitis due to pollen: Secondary | ICD-10-CM | POA: Diagnosis not present

## 2022-06-08 ENCOUNTER — Other Ambulatory Visit: Payer: Self-pay | Admitting: Nurse Practitioner

## 2022-06-08 DIAGNOSIS — I10 Essential (primary) hypertension: Secondary | ICD-10-CM

## 2022-06-08 NOTE — Telephone Encounter (Signed)
Chart supports Rx Last OV: VV 09/2021 Next OV: 06/2022

## 2022-06-13 ENCOUNTER — Ambulatory Visit: Payer: Medicare Other | Admitting: Nurse Practitioner

## 2022-06-14 ENCOUNTER — Ambulatory Visit (INDEPENDENT_AMBULATORY_CARE_PROVIDER_SITE_OTHER): Payer: Medicare Other

## 2022-06-14 VITALS — BP 144/84 | HR 60 | Temp 98.7°F | Resp 18 | Ht 67.0 in | Wt 254.2 lb

## 2022-06-14 DIAGNOSIS — J4550 Severe persistent asthma, uncomplicated: Secondary | ICD-10-CM

## 2022-06-14 MED ORDER — BENRALIZUMAB 30 MG/ML ~~LOC~~ SOSY
30.0000 mg | PREFILLED_SYRINGE | Freq: Once | SUBCUTANEOUS | Status: AC
  Administered 2022-06-14: 30 mg via SUBCUTANEOUS

## 2022-06-14 NOTE — Progress Notes (Signed)
Diagnosis: Asthma  Provider:  Praveen Mannam MD  Procedure: Injection  Fasenra (Benralizumab), Dose: 30 mg, Site: subcutaneous, Number of injections: 1   Discharge: Condition: Good, Destination: Home . AVS provided to patient.   Performed by:  Kilo Eshelman E Lilia Letterman, LPN       

## 2022-06-19 DIAGNOSIS — J301 Allergic rhinitis due to pollen: Secondary | ICD-10-CM | POA: Diagnosis not present

## 2022-06-20 ENCOUNTER — Encounter: Payer: Self-pay | Admitting: Nurse Practitioner

## 2022-06-20 ENCOUNTER — Ambulatory Visit (INDEPENDENT_AMBULATORY_CARE_PROVIDER_SITE_OTHER): Payer: Medicare Other | Admitting: Nurse Practitioner

## 2022-06-20 VITALS — BP 128/78 | HR 69 | Temp 97.0°F | Ht 67.0 in | Wt 254.2 lb

## 2022-06-20 DIAGNOSIS — E099 Drug or chemical induced diabetes mellitus without complications: Secondary | ICD-10-CM | POA: Diagnosis not present

## 2022-06-20 DIAGNOSIS — T380X5S Adverse effect of glucocorticoids and synthetic analogues, sequela: Secondary | ICD-10-CM

## 2022-06-20 DIAGNOSIS — S8992XD Unspecified injury of left lower leg, subsequent encounter: Secondary | ICD-10-CM | POA: Diagnosis not present

## 2022-06-20 DIAGNOSIS — M25562 Pain in left knee: Secondary | ICD-10-CM

## 2022-06-20 LAB — MICROALBUMIN / CREATININE URINE RATIO
Creatinine,U: 13.2 mg/dL
Microalb Creat Ratio: 5.3 mg/g (ref 0.0–30.0)
Microalb, Ur: <0.7 mg/dL (ref 0.0–1.9)

## 2022-06-20 LAB — RENAL FUNCTION PANEL
Albumin: 3.7 g/dL (ref 3.5–5.2)
BUN: 16 mg/dL (ref 6–23)
CO2: 28 mEq/L (ref 19–32)
Calcium: 9.5 mg/dL (ref 8.4–10.5)
Chloride: 105 mEq/L (ref 96–112)
Creatinine, Ser: 0.98 mg/dL (ref 0.40–1.20)
GFR: 56.91 mL/min — ABNORMAL LOW (ref >60.00)
Glucose, Bld: 83 mg/dL (ref 70–99)
Phosphorus: 3.4 mg/dL (ref 2.3–4.6)
Potassium: 4.2 mEq/L (ref 3.5–5.1)
Sodium: 140 mEq/L (ref 135–145)

## 2022-06-20 LAB — HEMOGLOBIN A1C: Hgb A1c MFr Bld: 6.2 % (ref 4.6–6.5)

## 2022-06-20 LAB — LDL CHOLESTEROL, DIRECT: Direct LDL: 89 mg/dL

## 2022-06-20 NOTE — Patient Instructions (Addendum)
Go to lab You will be contacted to schedule appt with ortho and for MRI. Schedule appt for annual diabetes eye exam.

## 2022-06-20 NOTE — Assessment & Plan Note (Signed)
No medication, no glucose check Repeat hgbA1c, urine microalbumin and renal function Advised to schedule DM eye exam chronic neuropathy but denies need for lyrica

## 2022-06-20 NOTE — Progress Notes (Unsigned)
Established Patient Visit  Patient: Rachael Silva   DOB: 1946-01-04   76 y.o. Female  MRN: [id number] Visit Date: 06/20/2022  Subjective:    Chief Complaint  Patient presents with  . Acute Visit    Had a fall in July, has been having left knee pain since  No other concerns    Knee Pain  Incident onset: 4weeks. Incident location: at [facility]. The injury mechanism was a fall. The pain is present in the left knee. The quality of the pain is described as aching. The pain is severe. The pain has been Constant since onset. Associated symptoms include an inability to bear weight. Pertinent negatives include no loss of motion, loss of sensation, muscle weakness, numbness or tingling. She reports no foreign bodies present. The symptoms are aggravated by movement, weight bearing and palpation. She has tried immobilization and acetaminophen for the symptoms. The treatment provided mild relief.  X-ray of bilateral knees completed at [facility] center: no fracture noted.  Steroid-induced diabetes mellitus (HCC) No medication, no glucose check Repeat hgbA1c, urine microalbumin and renal function Advised to schedule DM eye exam chronic neuropathy but denies need for lyrica  Reviewed medical, surgical, and social history today  Medications: Outpatient Medications Prior to Visit  Medication Sig  . albuterol (VENTOLIN HFA) 108 (90 Base) MCG/ACT inhaler TAKE 2 PUFFS BY MOUTH EVERY 6 HOURS AS NEEDED FOR WHEEZE OR SHORTNESS OF BREATH  . B Complex-C-Folic Acid (STRESS B COMPLEX PO) Take 1 tablet by mouth daily.  Marland Kitchen BREO ELLIPTA 200-25 MCG/ACT AEPB INHALE 1 PUFF BY MOUTH EVERY DAY  . Calcium Carbonate-Vitamin D (CALTRATE 600+D PO) Take 1 tablet by mouth 2 (two) times daily.  . cetirizine (ZYRTEC) 10 MG tablet TAKE 1 TABLET BY MOUTH EVERY DAY  . co-enzyme Q-10 30 MG capsule Take 200 mg by mouth daily.  Marland Kitchen EPIPEN 2-PAK 0.3 MG/0.3ML SOAJ injection See admin instructions.  Marland Kitchen  ezetimibe (ZETIA) 10 MG tablet TAKE 1 TABLET BY MOUTH EVERY DAY  . furosemide (LASIX) 20 MG tablet TAKE 1/2 TABLET BY MOUTH EVERY DAY  . GARLIC PO Take 1 capsule by mouth daily.  . montelukast (SINGULAIR) 10 MG tablet Take 1 tablet (10 mg total) by mouth daily.  Marland Kitchen omeprazole (PRILOSEC) 20 MG capsule Take 1 capsule (20 mg total) by mouth daily.  . [DISCONTINUED] Multiple Vitamins-Minerals (CENTRUM ADULTS PO) Take 1 tablet by mouth daily.  . Tiotropium Bromide Monohydrate (SPIRIVA RESPIMAT) 1.25 MCG/ACT AERS TAKE 2 PUFFS BY MOUTH TWICE A DAY (Patient not taking: Reported on 11/23/2021)  . [DISCONTINUED] azithromycin (ZITHROMAX Z-PAK) 250 MG tablet Take 1 tablet (250 mg total) by mouth daily. Take 2tabs on first day, then 1tab once a day till complete (Patient not taking: Reported on 11/23/2021)  . [DISCONTINUED] ONE TOUCH ULTRA TEST test strip USE TO TEST TWICE DAILY DX E09.9 (Patient not taking: Reported on 11/23/2021)  . [DISCONTINUED] ONETOUCH DELICA LANCETS 78I MISC USE TO TEST SUGARS TWICE DAILY DX E09.9 (Patient not taking: Reported on 11/23/2021)  . [DISCONTINUED] pregabalin (LYRICA) 25 MG capsule Take 1 capsule (25 mg total) by mouth 2 (two) times daily. (Patient not taking: Reported on 11/23/2021)   Facility-Administered Medications Prior to Visit  Medication Dose Route Frequency Provider  . omalizumab Arvid Right) injection 375 mg  375 mg Subcutaneous Q14 Days Flora Lipps, MD  . omalizumab Arvid Right) injection 375 mg  375 mg Subcutaneous Q14 Days  Flora Lipps, MD  . omalizumab Arvid Right) injection 375 mg  375 mg Subcutaneous Q14 Days Flora Lipps, MD   Reviewed past medical and social history.   ROS per HPI above  {Show previous labs (optional):23779}    Objective:  BP 128/78 (BP Location: Right Arm, Patient Position: Sitting, Cuff Size: Normal)   Pulse 69   Temp (!) 97 F (36.1 C) (Temporal)   Ht '5\' 7"'  (1.702 m)   Wt 254 lb 3.2 oz (115.3 kg)   SpO2 97%   BMI 39.81 kg/m      Physical  Exam Constitutional:      Appearance: She is not diaphoretic.  Cardiovascular:     Rate and Rhythm: Normal rate.     Pulses: Normal pulses.  Pulmonary:     Effort: Pulmonary effort is normal.  Musculoskeletal:        General: Swelling, tenderness and signs of injury present.     Right knee: Normal.     Left knee: Bony tenderness and crepitus present. Normal range of motion. Tenderness present over the medial joint line. No lateral joint line tenderness.     Right lower leg: Normal. No edema.     Left lower leg: Normal. No edema.  Skin:    General: Skin is warm and dry.     Findings: No erythema.  Neurological:     Mental Status: She is alert and oriented to person, place, and time.    No results found for any visits on 06/20/22.    Assessment & Plan:    Problem List Items Addressed This Visit       Endocrine   Steroid-induced diabetes mellitus ([place])    No medication, no glucose check Repeat hgbA1c, urine microalbumin and renal function Advised to schedule DM eye exam chronic neuropathy but denies need for lyrica      Relevant Orders   Hemoglobin A1c   Urine microalbumin-creatinine with uACR   Renal function panel with eGFR   Direct LDL     Other   Morbid obesity ([place])   Other Visit Diagnoses     Left medial knee pain    -  Primary   Relevant Orders   MR Knee Left  Wo Contrast   Ambulatory referral to Orthopedic Surgery   Injury of left knee, subsequent encounter       Relevant Orders   MR Knee Left  Wo Contrast   Ambulatory referral to Orthopedic Surgery      Return in about 3 months (around 09/19/2022) for DM, HTN, hyperlipidemia (fasting).     Wilfred Lacy, NP

## 2022-06-26 DIAGNOSIS — J301 Allergic rhinitis due to pollen: Secondary | ICD-10-CM | POA: Diagnosis not present

## 2022-06-29 ENCOUNTER — Ambulatory Visit: Payer: Medicare Other | Admitting: Orthopedic Surgery

## 2022-07-06 ENCOUNTER — Ambulatory Visit (INDEPENDENT_AMBULATORY_CARE_PROVIDER_SITE_OTHER): Payer: Medicare Other | Admitting: Orthopedic Surgery

## 2022-07-06 ENCOUNTER — Ambulatory Visit (INDEPENDENT_AMBULATORY_CARE_PROVIDER_SITE_OTHER): Payer: Medicare Other

## 2022-07-06 DIAGNOSIS — M25562 Pain in left knee: Secondary | ICD-10-CM | POA: Diagnosis not present

## 2022-07-06 DIAGNOSIS — J301 Allergic rhinitis due to pollen: Secondary | ICD-10-CM | POA: Diagnosis not present

## 2022-07-06 DIAGNOSIS — G8929 Other chronic pain: Secondary | ICD-10-CM | POA: Diagnosis not present

## 2022-07-13 DIAGNOSIS — J301 Allergic rhinitis due to pollen: Secondary | ICD-10-CM | POA: Diagnosis not present

## 2022-07-18 ENCOUNTER — Encounter: Payer: Self-pay | Admitting: Orthopedic Surgery

## 2022-07-18 NOTE — Progress Notes (Signed)
Office Visit Note   Patient: Rachael Silva           Date of Birth: [birth year]           MRN: [id number] Visit Date: 07/06/2022              Requested by: Flossie Buffy, NP [address] [place],  [place] [address] PCP: Flossie Buffy, NP  Chief Complaint  Patient presents with   Left Knee - Pain      HPI: Patient is a 76 year old woman who is seen for initial evaluation for left knee pain patellofemoral joint.  Patient states she had a fall in July she has been using compression complains of increasing pain with changes in weather.  Assessment & Plan: Visit Diagnoses:  1. Chronic pain of left knee     Plan: Patient would like to proceed with Voltaren gel at this time 3 times a day reevaluate in 4 weeks for the possibility of an injection at follow-up.  Follow-Up Instructions: Return in about 4 weeks (around 08/03/2022).   Ortho Exam  Patient is alert, oriented, no adenopathy, well-dressed, normal affect, normal respiratory effort. Examination patient has crepitation with range of motion of the left knee in the patellofemoral joint.  Pain reproduced with palpation medial lateral facet of the patella.  Medial lateral joint line minimally tender to palpation collaterals cruciates are stable.  Imaging: No results found. No images are attached to the encounter.  Labs: Lab Results  Component Value Date   HGBA1C 6.2 06/20/2022   HGBA1C 6.2 03/01/2021   HGBA1C 6.2 08/09/2020   ESRSEDRATE 16 10/18/2018   ESRSEDRATE 6 02/14/2018   CRP <1 10/18/2018   CRP 9.3 (H) 02/14/2018   LABURIC 3.8 02/14/2018   REPTSTATUS 02/19/2018 FINAL 02/14/2018   REPTSTATUS 02/19/2018 FINAL 02/14/2018   GRAMSTAIN  04/07/2016    ABUNDANT WBC PRESENT,BOTH PMN AND MONONUCLEAR ABUNDANT GRAM POSITIVE COCCI IN CHAINS IN PAIRS MODERATE GRAM VARIABLE ROD RARE YEAST    CULT  02/14/2018    NO GROWTH 5 DAYS Performed at [facility], [address] [place], [place]  [id number]    CULT  02/14/2018    NO GROWTH 5 DAYS Performed at [facility], [address] [place], [place] [address]    [facility] No Acid Fast Bacilli Isolated in 6 Weeks 06/24/2014     Lab Results  Component Value Date   ALBUMIN 3.7 06/20/2022   ALBUMIN 3.8 08/09/2020   ALBUMIN 4.1 06/26/2019    No results found for: "MG" Lab Results  Component Value Date   VD25OH 41.46 05/09/2018    No results found for: "PREALBUMIN"    Latest Ref Rng & Units 12/23/2019   12:05 PM 07/01/2018    2:37 PM 03/28/2018   10:30 AM  CBC EXTENDED  WBC 4.0 - 10.5 K/uL 6.7  3.8  20.5   RBC 3.87 - 5.11 Mil/uL 4.82  4.86  3.63   Hemoglobin 12.0 - 15.0 g/dL 14.4  13.9  10.4   HCT 36.0 - 46.0 % 42.7  41.0  31.7   Platelets 150.0 - 400.0 K/uL 202.0  209  221   NEUT# 1.4 - 7.7 K/uL 2.5  1.3  3.9   Lymph# 0.7 - 4.0 K/uL 3.6  2.1  2.7      There is no height or weight on file to calculate BMI.  Orders:  Orders Placed This Encounter  Procedures   XR Knee 1-2 Views Left  No orders of the defined types were placed in this encounter.    Procedures: No procedures performed  Clinical Data: No additional findings.  ROS:  All other systems negative, except as noted in the HPI. Review of Systems  Objective: Vital Signs: There were no vitals taken for this visit.  Specialty Comments:  No specialty comments available.  PMFS History: Patient Active Problem List   Diagnosis Date Noted   Severe persistent asthma 12/05/2020   Hx of herpes zoster 05/07/2020   Paresthesia 10/18/2018   Peripheral polyneuropathy 05/07/2018   Essential hypertension 05/07/2018   Elevated liver enzymes 03/20/2018   Lymphedema of both lower extremities 03/20/2018   Positive ANA (antinuclear antibody) 03/20/2018   Eosinophilia 02/21/2018   Swelling of right hand 02/14/2018   Cellulitis of finger of right hand 02/14/2018   Dizziness 02/26/2017   Right shoulder pain 02/26/2017   Hyperlipidemia 12/03/2016    Night sweats 12/01/2016   Encounter for screening mammogram for breast cancer 05/17/2016   OSA on CPAP 05/11/2016   Syncopal episodes 05/11/2016   Chronic cough    Memory impairment 05/11/2015   Excessive daytime sleepiness 05/11/2015   Snoring 05/05/2015   Morbid obesity ([place]) 05/05/2015   Steroid-induced diabetes mellitus ([place]) 11/11/2014   ABPA (allergic bronchopulmonary aspergillosis) ([place]) 07/08/2014   Cough 06/18/2014   Asthma, chronic 06/18/2014   Bursitis, shoulder 12/09/2013   Abnormal mammogram with microcalcification 06/06/2011   EDEMA 03/03/2009   SINUSITIS, CHRONIC 08/18/2008   Arthritis 04/15/2008   Weight gain due to medication 02/18/2008   HOT FLASHES 02/18/2008   GERD (gastroesophageal reflux disease) 08/01/2007   ALLERGIC RHINITIS 05/15/2007   ASTHMA 05/15/2007   PERIODONTAL DISEASE 05/15/2007   MURMUR 05/15/2007   URINARY INCONTINENCE 05/15/2007   Past Medical History:  Diagnosis Date   Allergy    allergic rhinitis   Angioedema 02/2006   Arthritis    OA of knees   Asthma    Complication of anesthesia    apnea- sleep   Frequent headaches    Glucocorticoids and synthetic analogues causing adverse effect in therapeutic use 08/27/2014   Hiatal hernia    Hypertension    Hypothyroid    Labile blood pressure    Long term current use of systemic steroids 08/27/2014   Lower extremity edema    Lung disease    Pneumonia    Pneumonia, eosinophilic ([place])    Productive cough 05/27/2014   Shortness of breath dyspnea    Sleep apnea    Urinary incontinence    Wheezing     Family History  Problem Relation Age of Onset   Diabetes Mother        pre-diabetic   Heart disease Mother        ? heart disease   Emphysema Mother    Thyroid disease Mother    Diabetes Maternal Uncle    Heart disease Father    Breast cancer Neg Hx     Past Surgical History:  Procedure Laterality Date   BREAST BIOPSY Left 2013   NEG   BREAST SURGERY  1966   breast biopsy    CESAREAN SECTION     x2   FLEXIBLE BRONCHOSCOPY N/A 09/16/2015   Procedure: FLEXIBLE BRONCHOSCOPY WITH FLURO ;  Surgeon: Vilinda Boehringer, MD;  Location: [facility] ORS;  Service: Pulmonary;  Laterality: N/A;   Social History   Occupational History   Occupation: retired    Fish farm manager: RETIRED  Tobacco Use   Smoking status: Never   Smokeless  tobacco: Never  Vaping Use   Vaping Use: Never used  Substance and Sexual Activity   Alcohol use: No    Alcohol/week: 0.0 standard drinks of alcohol   Drug use: No   Sexual activity: Never

## 2022-07-20 ENCOUNTER — Encounter: Payer: Self-pay | Admitting: Internal Medicine

## 2022-07-20 ENCOUNTER — Ambulatory Visit (INDEPENDENT_AMBULATORY_CARE_PROVIDER_SITE_OTHER): Payer: Medicare Other | Admitting: Internal Medicine

## 2022-07-20 VITALS — BP 120/62 | HR 70 | Temp 98.1°F | Ht 66.0 in | Wt 254.0 lb

## 2022-07-20 DIAGNOSIS — G4733 Obstructive sleep apnea (adult) (pediatric): Secondary | ICD-10-CM | POA: Diagnosis not present

## 2022-07-20 DIAGNOSIS — J4520 Mild intermittent asthma, uncomplicated: Secondary | ICD-10-CM | POA: Diagnosis not present

## 2022-07-20 DIAGNOSIS — J301 Allergic rhinitis due to pollen: Secondary | ICD-10-CM | POA: Diagnosis not present

## 2022-07-20 MED ORDER — FLUTICASONE FUROATE-VILANTEROL 100-25 MCG/ACT IN AEPB: 1.0000 | INHALATION_SPRAY | Freq: Every day | RESPIRATORY_TRACT | 5 refills | Status: DC

## 2022-07-20 NOTE — Patient Instructions (Addendum)
Avoid allergens Avoid sick contacts Avoid secondhand smoke Change BREO to 100 Continue therapy for asthma(FASENRA) Continue CPAP as prescribed

## 2022-07-20 NOTE — Progress Notes (Signed)
MRN# [id number] Rachael Silva 12-24-1945     SYNOPSIS 76 -year-old female past medical history of asthma. Being followed by pulmonary for chronic eosinophilic pneumonia, status post bronchoscopy in December 2016 with negative cultures for AFB and fungus. Transbronchial biopsy-positive for eosinophilic infiltrate. Treated with steroids unable to wean since  October 2015.    CC: Follow-up asthma Follow-up sleep apnea    HPI  Asthma seems to be well-controlled at this time History of ABPA history of chronic prednisone therapy  Patient has Berna Bue therapy every 8 weeks No asthma ex cerebrations since initiation of therapy  Also on Breo 200  1 puffs daily  Not using Spiriva Plan to de-escalate to Breo 100   Has intermittent wheezing Patient is still surrounded by cats  No exacerbation at this time No evidence of heart failure at this time No evidence or signs of infection at this time No respiratory distress No fevers, chills, nausea, vomiting, diarrhea No evidence of lower extremity edema No evidence hemoptysis   Regarding her obstructive sleep apnea Compliance report reviewed in detail with patient Download from 07/2022 Excellent compliance report 97% for days 93% for greater than 4 hours  AHI reduced to 2.0 Auto CPAP 11-15     BP 120/62 (BP Location: Left Arm, Cuff Size: Large)   Pulse 70   Temp 98.1 F (36.7 C) (Temporal)   Ht 5\' 6"  (1.676 m)   Wt 254 lb (115.2 kg)   SpO2 98%   BMI 41.00 kg/m    Review of Systems: Gen:  Denies  fever, sweats, chills weight loss  HEENT: Denies blurred vision, double vision, ear pain, eye pain, hearing loss, nose bleeds, sore throat Cardiac:  No dizziness, chest pain or heaviness, chest tightness,edema, No JVD Resp:   No cough, -sputum production, -shortness of breath,-wheezing, -hemoptysis,  Other:  All other systems negative    Physical Examination:   General Appearance: No distress  EYES PERRLA, EOM intact.    NECK Supple, No JVD Pulmonary: normal breath sounds, No wheezing.  CardiovascularNormal S1,S2.  No m/r/g.   Abdomen: Benign, Soft, non-tender. ALL OTHER ROS ARE NEGATIVE   Allergies:  Lisinopril, Atorvastatin, and Erythromycin           LABS Results for Rachael Silva, Rachael Silva (MRN [id number]) as of 08/01/2016 11:16  Ref. Range 07/02/2014 13:00 08/05/2015 15:12 03/30/2016 15:11  IgE (Immunoglobulin E), Serum Latest Ref Range: 0 - 100 IU/mL 2,401 (H) 1,253 (H) 1,180 (H)   Results for Rachael Silva, Rachael Silva (MRN [id number]) as of 08/01/2016 11:16  Ref. Range 08/05/2015 15:12 09/16/2015 12:46 03/30/2016 15:11 05/03/2016 08:42 07/26/2016 15:31  Eosinophil Latest Units: % 10 11 5  5.4 (H) 3        Assessment and Plan:   76 year old pleasant African-American female seen today for follow-up for persistent asthma which is now mild in the setting of immunological and biological therapy with a history of chronic eosinophilic pneumonia and ABPA with underlying sleep apnea    Asthma mild intermittent  Well controlled with Berna Bue  We will change Breo 200 and downgrade to Breo 100 Advised to avoid allergens and secondhand smoke   recommend avoiding cats  OSA Excellent compliance report CPAP 11-15 AHI reduced Patient uses and benefits from therapy  Obesity -recommend significant weight loss -recommend changing diet  Deconditioned state -Recommend increased daily activity and exercise   Allergic rhinitis Continue antihistamines Continue Singulair Continue Flonase   History of ABPA No indication for prednisone at this time Bronchoscopy with BAL in 2016 was consistent  with eosinophilic pneumonia     MEDICATION ADJUSTMENTS/LABS AND TESTS ORDERED: Avoid allergens Avoid sick contacts Avoid secondhand smoke Change BREO to 100 Continue therapy for asthma(FASENRA) Continue CPAP as prescribed  Patient  satisfied with Plan of action and management. All questions answered  Follow-up  in 1 year  Time spent 21 minutes   Rachael Silva [name], M.D.  [facility]  Medical Director [facility] Medical Director [facility] Cardio-Pulmonary Department

## 2022-07-21 DIAGNOSIS — J301 Allergic rhinitis due to pollen: Secondary | ICD-10-CM | POA: Diagnosis not present

## 2022-07-25 ENCOUNTER — Telehealth: Payer: Self-pay | Admitting: *Deleted

## 2022-07-25 NOTE — Patient Outreach (Signed)
  Care Coordination   07/25/2022 Name: Janisha Bueso MRN: [id number] DOB: Jul 15, 1946   Care Coordination Outreach Attempts:  An unsuccessful telephone outreach was attempted today to offer the patient information about available care coordination services as a benefit of their health plan.   Follow Up Plan:  Additional outreach attempts will be made to offer the patient care coordination information and services.   Encounter Outcome:  No Answer  Care Coordination Interventions Activated:  Yes   Care Coordination Interventions:  No, not indicated    [place] [facility] [phone]

## 2022-07-27 DIAGNOSIS — J301 Allergic rhinitis due to pollen: Secondary | ICD-10-CM | POA: Diagnosis not present

## 2022-08-03 ENCOUNTER — Ambulatory Visit: Payer: Medicare Other | Admitting: Orthopedic Surgery

## 2022-08-09 ENCOUNTER — Ambulatory Visit (INDEPENDENT_AMBULATORY_CARE_PROVIDER_SITE_OTHER): Payer: Medicare Other

## 2022-08-09 ENCOUNTER — Telehealth: Payer: Self-pay | Admitting: Pharmacist

## 2022-08-09 VITALS — BP 178/88 | HR 83 | Temp 97.7°F | Resp 20 | Ht 67.0 in | Wt 251.0 lb

## 2022-08-09 DIAGNOSIS — J4550 Severe persistent asthma, uncomplicated: Secondary | ICD-10-CM

## 2022-08-09 MED ORDER — BENRALIZUMAB 30 MG/ML ~~LOC~~ SOSY
30.0000 mg | PREFILLED_SYRINGE | Freq: Once | SUBCUTANEOUS | Status: AC
  Administered 2022-08-09: 30 mg via SUBCUTANEOUS

## 2022-08-09 NOTE — Progress Notes (Signed)
Diagnosis: Asthma  Provider:  Praveen Mannam MD  Procedure: Injection  Fasenra (Benralizumab), Dose: 30 mg, Site: subcutaneous, Number of injections: 1   Discharge: Condition: Good, Destination: Home . AVS provided to patient.   Performed by:  Ashea Winiarski E Kesley Mullens, LPN       

## 2022-08-09 NOTE — Telephone Encounter (Signed)
Received notification from Obie Dredge, at infusion cneter that patient is interested in self-administration of Fasenra.  Her next dose is 10/05/2022  Please start [facility] autoinjector SQ BIV.  Dose: 30mg  SQ every 8 weeks  Dx: Moderate persistent asthma  Previously tried and failed Xolair  Likely will need to pursue patient assistance if Medicare  Knox Saliva, PharmD, MPH, BCPS, CPP Clinical Pharmacist (Rheumatology and Pulmonology)

## 2022-08-14 ENCOUNTER — Encounter: Payer: Self-pay | Admitting: Internal Medicine

## 2022-08-14 ENCOUNTER — Other Ambulatory Visit (HOSPITAL_COMMUNITY): Payer: Self-pay

## 2022-08-14 NOTE — Telephone Encounter (Signed)
Received a notice of cancellation from [employer] stating that, while they ARE responsible for reviewing pharmacy claims for [facility], they do not perform reviews for THIS medication. Fax stated to contact the [facility] at [phone] for f/u.  After speaking to the rep from the [facility], it appears that Berna Bue is a plan exclusion on pt's current pharmacy benefit. However, rep goes on to state that since pt is over the age of 89 she will need to have her prescription coverage converted to a Part D plan (?) which might include coverage of the medication. She provided me with an email address that she would need me to send in a copy of the pt's Medicare A&B card as well as a copy of her health insurance card.  [email or web address]

## 2022-08-14 NOTE — Telephone Encounter (Signed)
Submitted a Prior Authorization request to [facility] for [facility] via CoverMyMeds. Will update once we receive a response.   Key: [id number]  Of note, pt appears to be commercially insured.

## 2022-08-15 ENCOUNTER — Telehealth: Payer: Self-pay | Admitting: Nurse Practitioner

## 2022-08-15 DIAGNOSIS — J4541 Moderate persistent asthma with (acute) exacerbation: Secondary | ICD-10-CM

## 2022-08-15 NOTE — Telephone Encounter (Signed)
Pt is coughing up yellow mucus can you call her some medication in

## 2022-08-15 NOTE — Telephone Encounter (Signed)
Pt has been scheduled for 08/16/2022, 10:00 am pt has been notified

## 2022-08-16 ENCOUNTER — Encounter: Payer: Self-pay | Admitting: Nurse Practitioner

## 2022-08-16 ENCOUNTER — Ambulatory Visit (INDEPENDENT_AMBULATORY_CARE_PROVIDER_SITE_OTHER): Payer: Medicare Other | Admitting: Nurse Practitioner

## 2022-08-16 VITALS — BP 150/96 | HR 85 | Temp 96.7°F | Ht 67.0 in | Wt 249.8 lb

## 2022-08-16 DIAGNOSIS — J069 Acute upper respiratory infection, unspecified: Secondary | ICD-10-CM

## 2022-08-16 LAB — POCT INFLUENZA A/B
Influenza A, POC: NEGATIVE
Influenza B, POC: NEGATIVE

## 2022-08-16 LAB — POC COVID19 BINAXNOW: SARS Coronavirus 2 Ag: NEGATIVE

## 2022-08-16 MED ORDER — BENZONATATE 200 MG PO CAPS: 200.0000 mg | ORAL_CAPSULE | Freq: Three times a day (TID) | ORAL | 0 refills | Status: DC | PRN

## 2022-08-16 NOTE — Patient Instructions (Signed)
Negative COVID and flu test  URI Instructions: Encourage adequate oral hydration. Manage symptoms with warm tea, honey and lemon. May add ginger if desires.  "Common cold" symptoms are usually triggered by a virus.  The antibiotics are usually not necessary. On average, a" viral cold" illness may take 7-10 days to resolve.  Please, make an appointment if you are worse.  Call office if no improvement in 1weeks.

## 2022-08-16 NOTE — Progress Notes (Signed)
Established Patient Visit  Patient: Rachael Silva   DOB: 22-Aug-1946   76 y.o. Female  MRN: [id number] Visit Date: 08/16/2022  Subjective:    Chief Complaint  Patient presents with   Acute Visit    C/o runny nose, coughing up mucus x 1 week     URI  This is a new problem. The current episode started in the past 7 days. The problem has been unchanged. There has been no fever. Associated symptoms include congestion, coughing and rhinorrhea. Pertinent negatives include no headaches, sinus pain, sneezing, sore throat, swollen glands or wheezing. She has tried antihistamine for the symptoms. The treatment provided no relief.   BP Readings from Last 3 Encounters:  08/16/22 (!) 150/96  08/09/22 (!) 178/88  07/20/22 120/62    Wt Readings from Last 3 Encounters:  08/16/22 249 lb 12.8 oz (113.3 kg)  08/09/22 251 lb (113.9 kg)  07/20/22 254 lb (115.2 kg)    Reviewed medical, surgical, and social history today  Medications: Outpatient Medications Prior to Visit  Medication Sig   albuterol (VENTOLIN HFA) 108 (90 Base) MCG/ACT inhaler TAKE 2 PUFFS BY MOUTH EVERY 6 HOURS AS NEEDED FOR WHEEZE OR SHORTNESS OF BREATH   B Complex-C-Folic Acid (STRESS B COMPLEX PO) Take 1 tablet by mouth daily.   BREO ELLIPTA 200-25 MCG/ACT AEPB INHALE 1 PUFF BY MOUTH EVERY DAY   Calcium Carbonate-Vitamin D (CALTRATE 600+D PO) Take 1 tablet by mouth 2 (two) times daily.   cetirizine (ZYRTEC) 10 MG tablet TAKE 1 TABLET BY MOUTH EVERY DAY   co-enzyme Q-10 30 MG capsule Take 200 mg by mouth daily.   EPIPEN 2-PAK 0.3 MG/0.3ML SOAJ injection See admin instructions.   ezetimibe (ZETIA) 10 MG tablet TAKE 1 TABLET BY MOUTH EVERY DAY   fluticasone furoate-vilanterol (BREO ELLIPTA) 100-25 MCG/ACT AEPB Inhale 1 puff into the lungs daily.   furosemide (LASIX) 20 MG tablet TAKE 1/2 TABLET BY MOUTH EVERY DAY   GARLIC PO Take 1 capsule by mouth daily.   montelukast (SINGULAIR) 10 MG tablet Take 1 tablet (10 mg  total) by mouth daily.   omeprazole (PRILOSEC) 20 MG capsule Take 1 capsule (20 mg total) by mouth daily.   Facility-Administered Medications Prior to Visit  Medication Dose Route Frequency Provider   omalizumab Geoffry Paradise) injection 375 mg  375 mg Subcutaneous Q14 Days Erin Fulling, MD   omalizumab Geoffry Paradise) injection 375 mg  375 mg Subcutaneous Q14 Days Erin Fulling, MD   Reviewed past medical and social history.   ROS per HPI above      Objective:  BP (!) 150/96 (BP Location: Right Arm, Patient Position: Sitting, Cuff Size: Normal)   Pulse 85   Temp (!) 96.7 F (35.9 C) (Temporal)   Ht 5\' 7"  (1.702 m)   Wt 249 lb 12.8 oz (113.3 kg)   SpO2 95%   BMI 39.12 kg/m      Physical Exam Vitals reviewed.  Cardiovascular:     Rate and Rhythm: Normal rate and regular rhythm.     Pulses: Normal pulses.     Heart sounds: Normal heart sounds.  Pulmonary:     Effort: Pulmonary effort is normal.     Breath sounds: Normal breath sounds.  Lymphadenopathy:     Cervical: No cervical adenopathy.  Neurological:     Mental Status: She is alert and oriented to person, place, and time.     Results  for orders placed or performed in visit on 08/16/22  POC COVID-19  Result Value Ref Range   SARS Coronavirus 2 Ag Negative Negative  POCT Influenza A/B  Result Value Ref Range   Influenza A, POC Negative Negative   Influenza B, POC Negative Negative      Assessment & Plan:    Problem List Items Addressed This Visit   None Visit Diagnoses     Viral upper respiratory tract infection    -  Primary   Relevant Medications   benzonatate (TESSALON) 200 MG capsule   Other Relevant Orders   POC COVID-19 (Completed)   POCT Influenza A/B (Completed)     Negative COVID and flu test Encourage adequate oral hydration. Manage symptoms with warm tea, honey and lemon. May add ginger if desires.  "Common cold" symptoms are usually triggered by a virus.  The antibiotics are usually not necessary. On  average, a" viral cold" illness may take 7-10 days to resolve.  Please, make an appointment if you are worse.  Call office if no improvement in 1weeks.  Return if symptoms worsen or fail to improve.     Alysia Penna, NP

## 2022-08-16 NOTE — Telephone Encounter (Signed)
Called pt and provided update, she stated that the situation seems overly complicated (which I agreed) and would like time to think about it. I informed her that I would send her a message through MyChart with the phone number to the department I spoke to. (Refer to MyChart message sent within this encounter)  Will await f/u.

## 2022-08-17 ENCOUNTER — Ambulatory Visit (INDEPENDENT_AMBULATORY_CARE_PROVIDER_SITE_OTHER): Payer: Medicare Other | Admitting: Orthopedic Surgery

## 2022-08-17 DIAGNOSIS — G8929 Other chronic pain: Secondary | ICD-10-CM

## 2022-08-17 DIAGNOSIS — M25562 Pain in left knee: Secondary | ICD-10-CM | POA: Diagnosis not present

## 2022-08-17 DIAGNOSIS — J301 Allergic rhinitis due to pollen: Secondary | ICD-10-CM | POA: Diagnosis not present

## 2022-08-28 ENCOUNTER — Encounter: Payer: Self-pay | Admitting: Orthopedic Surgery

## 2022-08-28 DIAGNOSIS — G8929 Other chronic pain: Secondary | ICD-10-CM | POA: Diagnosis not present

## 2022-08-28 DIAGNOSIS — J45991 Cough variant asthma: Secondary | ICD-10-CM | POA: Diagnosis not present

## 2022-08-28 DIAGNOSIS — M25562 Pain in left knee: Secondary | ICD-10-CM

## 2022-08-28 DIAGNOSIS — J309 Allergic rhinitis, unspecified: Secondary | ICD-10-CM | POA: Diagnosis not present

## 2022-08-28 MED ORDER — METHYLPREDNISOLONE ACETATE 40 MG/ML IJ SUSP
40.0000 mg | INTRAMUSCULAR | Status: AC | PRN
  Administered 2022-08-28: 40 mg via INTRA_ARTICULAR

## 2022-08-28 MED ORDER — LIDOCAINE HCL (PF) 1 % IJ SOLN
5.0000 mL | INTRAMUSCULAR | Status: AC | PRN
  Administered 2022-08-28: 5 mL

## 2022-08-28 NOTE — Progress Notes (Signed)
Office Visit Note   Patient: Rachael Silva           Date of Birth: 15-Sep-1946           MRN: [id number] Visit Date: 08/17/2022              Requested by: Anne Ng, NP [address] [place],  [place] [address] PCP: Anne Ng, NP  Chief Complaint  Patient presents with   Left Knee - Follow-up      HPI: Patient is a 76 year old woman who presents in follow-up for patellofemoral arthritis left knee.  Patient has tried strengthening Voltaren gel without sufficient relief.  Assessment & Plan: Visit Diagnoses:  1. Chronic pain of left knee     Plan: The left knee was injected without complications.  Continue with VMO strengthening.  Follow-Up Instructions: No follow-ups on file.   Ortho Exam  Patient is alert, oriented, no adenopathy, well-dressed, normal affect, normal respiratory effort. Examination there is no effusion no redness of the left knee.  There is crepitation in the patellofemoral joint with range of motion medial lateral joint lines are nontender to palpation collaterals and cruciates are stable.  Palpation of the medial lateral patella facets reproduces her pain.  Imaging: No results found. No images are attached to the encounter.  Labs: Lab Results  Component Value Date   HGBA1C 6.2 06/20/2022   HGBA1C 6.2 03/01/2021   HGBA1C 6.2 08/09/2020   ESRSEDRATE 16 10/18/2018   ESRSEDRATE 6 02/14/2018   CRP <1 10/18/2018   CRP 9.3 (H) 02/14/2018   LABURIC 3.8 02/14/2018   REPTSTATUS 02/19/2018 FINAL 02/14/2018   REPTSTATUS 02/19/2018 FINAL 02/14/2018   GRAMSTAIN  04/07/2016    ABUNDANT WBC PRESENT,BOTH PMN AND MONONUCLEAR ABUNDANT GRAM POSITIVE COCCI IN CHAINS IN PAIRS MODERATE GRAM VARIABLE ROD RARE YEAST    CULT  02/14/2018    NO GROWTH 5 DAYS Performed at [facility], [address] [place], [place] [address]    CULT  02/14/2018    NO GROWTH 5 DAYS Performed at [facility], [address]  [place], [place] [address]    [facility] No Acid Fast Bacilli Isolated in 6 Weeks 06/24/2014     Lab Results  Component Value Date   ALBUMIN 3.7 06/20/2022   ALBUMIN 3.8 08/09/2020   ALBUMIN 4.1 06/26/2019    No results found for: "MG" Lab Results  Component Value Date   VD25OH 41.46 05/09/2018    No results found for: "PREALBUMIN"    Latest Ref Rng & Units 12/23/2019   12:05 PM 07/01/2018    2:37 PM 03/28/2018   10:30 AM  CBC EXTENDED  WBC 4.0 - 10.5 K/uL 6.7  3.8  20.5   RBC 3.87 - 5.11 Mil/uL 4.82  4.86  3.63   Hemoglobin 12.0 - 15.0 g/dL 53.2  99.2  42.6   HCT 36.0 - 46.0 % 42.7  41.0  31.7   Platelets 150.0 - 400.0 K/uL 202.0  209  221   NEUT# 1.4 - 7.7 K/uL 2.5  1.3  3.9   Lymph# 0.7 - 4.0 K/uL 3.6  2.1  2.7      There is no height or weight on file to calculate BMI.  Orders:  No orders of the defined types were placed in this encounter.  No orders of the defined types were placed in this encounter.    Procedures: Large Joint Inj: L knee on 08/28/2022 12:40 PM Indications: pain and diagnostic  evaluation Details: 22 G 1.5 in needle, anteromedial approach  Arthrogram: No  Medications: 5 mL lidocaine (PF) 1 %; 40 mg methylPREDNISolone acetate 40 MG/ML Outcome: tolerated well, no immediate complications Procedure, treatment alternatives, risks and benefits explained, specific risks discussed. Consent was given by the patient. Immediately prior to procedure a time out was called to verify the correct patient, procedure, equipment, support staff and site/side marked as required. Patient was prepped and draped in the usual sterile fashion.      Clinical Data: No additional findings.  ROS:  All other systems negative, except as noted in the HPI. Review of Systems  Objective: Vital Signs: There were no vitals taken for this visit.  Specialty Comments:  No specialty comments available.  PMFS History: Patient Active Problem List   Diagnosis Date Noted    Severe persistent asthma 12/05/2020   Hx of herpes zoster 05/07/2020   Paresthesia 10/18/2018   Peripheral polyneuropathy 05/07/2018   Essential hypertension 05/07/2018   Elevated liver enzymes 03/20/2018   Lymphedema of both lower extremities 03/20/2018   Positive ANA (antinuclear antibody) 03/20/2018   Eosinophilia 02/21/2018   Swelling of right hand 02/14/2018   Cellulitis of finger of right hand 02/14/2018   Dizziness 02/26/2017   Right shoulder pain 02/26/2017   Hyperlipidemia 12/03/2016   Night sweats 12/01/2016   Encounter for screening mammogram for breast cancer 05/17/2016   OSA on CPAP 05/11/2016   Syncopal episodes 05/11/2016   Chronic cough    Memory impairment 05/11/2015   Excessive daytime sleepiness 05/11/2015   Snoring 05/05/2015   Morbid obesity (HCC) 05/05/2015   Steroid-induced diabetes mellitus (HCC) 11/11/2014   ABPA (allergic bronchopulmonary aspergillosis) (HCC) 07/08/2014   Cough 06/18/2014   Asthma, chronic 06/18/2014   Bursitis, shoulder 12/09/2013   Abnormal mammogram with microcalcification 06/06/2011   EDEMA 03/03/2009   SINUSITIS, CHRONIC 08/18/2008   Arthritis 04/15/2008   Weight gain due to medication 02/18/2008   HOT FLASHES 02/18/2008   GERD (gastroesophageal reflux disease) 08/01/2007   ALLERGIC RHINITIS 05/15/2007   ASTHMA 05/15/2007   PERIODONTAL DISEASE 05/15/2007   MURMUR 05/15/2007   URINARY INCONTINENCE 05/15/2007   Past Medical History:  Diagnosis Date   Allergy    allergic rhinitis   Angioedema 02/2006   Arthritis    OA of knees   Asthma    Complication of anesthesia    apnea- sleep   Frequent headaches    Glucocorticoids and synthetic analogues causing adverse effect in therapeutic use 08/27/2014   Hiatal hernia    Hypertension    Hypothyroid    Labile blood pressure    Long term current use of systemic steroids 08/27/2014   Lower extremity edema    Lung disease    Pneumonia    Pneumonia, eosinophilic (HCC)     Productive cough 05/27/2014   Shortness of breath dyspnea    Sleep apnea    Urinary incontinence    Wheezing     Family History  Problem Relation Age of Onset   Diabetes Mother        pre-diabetic   Heart disease Mother        ? heart disease   Emphysema Mother    Thyroid disease Mother    Diabetes Maternal Uncle    Heart disease Father    Breast cancer Neg Hx     Past Surgical History:  Procedure Laterality Date   BREAST BIOPSY Left 2013   NEG   BREAST SURGERY  1966   breast  biopsy   CESAREAN SECTION     x2   FLEXIBLE BRONCHOSCOPY N/A 09/16/2015   Procedure: FLEXIBLE BRONCHOSCOPY WITH FLURO ;  Surgeon: Stephanie Acre, MD;  Location: [facility] ORS;  Service: Pulmonary;  Laterality: N/A;   Social History   Occupational History   Occupation: retired    Associate Professor: RETIRED  Tobacco Use   Smoking status: Never   Smokeless tobacco: Never  Vaping Use   Vaping Use: Never used  Substance and Sexual Activity   Alcohol use: No    Alcohol/week: 0.0 standard drinks of alcohol   Drug use: No   Sexual activity: Never

## 2022-08-28 NOTE — Telephone Encounter (Signed)
Pt states she does not feel any better, she is still coughing and has a runny nose. She is under the impression you were going to send an antibiotic in for her if her symptoms did not clear up.   [id number] [place], [place] - [address] [place] [address] Phone: [phone]  Fax: [phone] DEA #: [id number]

## 2022-08-29 MED ORDER — CEFDINIR 300 MG PO CAPS: 300.0000 mg | ORAL_CAPSULE | Freq: Two times a day (BID) | ORAL | 0 refills | Status: DC

## 2022-08-29 NOTE — Telephone Encounter (Signed)
Called & provided pt with detailed VM, also MyChart message was sent.

## 2022-08-29 NOTE — Addendum Note (Signed)
Addended by: Alysia Penna L on: 08/29/2022 10:30 AM   Modules accepted: Orders

## 2022-08-30 ENCOUNTER — Other Ambulatory Visit: Payer: Self-pay | Admitting: Internal Medicine

## 2022-08-30 DIAGNOSIS — J301 Allergic rhinitis due to pollen: Secondary | ICD-10-CM | POA: Diagnosis not present

## 2022-09-07 DIAGNOSIS — J301 Allergic rhinitis due to pollen: Secondary | ICD-10-CM | POA: Diagnosis not present

## 2022-09-08 ENCOUNTER — Other Ambulatory Visit: Payer: Self-pay | Admitting: Nurse Practitioner

## 2022-09-08 DIAGNOSIS — E782 Mixed hyperlipidemia: Secondary | ICD-10-CM

## 2022-09-08 NOTE — Telephone Encounter (Signed)
Chart supports Rx Last OV: 08/2022 Next OV: not scheduled   

## 2022-09-13 ENCOUNTER — Other Ambulatory Visit: Payer: Self-pay | Admitting: Nurse Practitioner

## 2022-09-13 DIAGNOSIS — Z1231 Encounter for screening mammogram for malignant neoplasm of breast: Secondary | ICD-10-CM

## 2022-09-14 DIAGNOSIS — J301 Allergic rhinitis due to pollen: Secondary | ICD-10-CM | POA: Diagnosis not present

## 2022-09-21 DIAGNOSIS — J301 Allergic rhinitis due to pollen: Secondary | ICD-10-CM | POA: Diagnosis not present

## 2022-10-05 ENCOUNTER — Ambulatory Visit (INDEPENDENT_AMBULATORY_CARE_PROVIDER_SITE_OTHER): Payer: Medicare Other

## 2022-10-05 VITALS — BP 156/83 | HR 59 | Temp 98.7°F | Resp 20 | Ht 67.0 in | Wt 251.0 lb

## 2022-10-05 DIAGNOSIS — J4550 Severe persistent asthma, uncomplicated: Secondary | ICD-10-CM

## 2022-10-05 MED ORDER — BENRALIZUMAB 30 MG/ML ~~LOC~~ SOSY
30.0000 mg | PREFILLED_SYRINGE | Freq: Once | SUBCUTANEOUS | Status: AC
  Administered 2022-10-05: 30 mg via SUBCUTANEOUS
  Filled 2022-10-05: qty 1

## 2022-10-05 NOTE — Progress Notes (Signed)
Diagnosis: Asthma  Provider:  Chilton Greathouse MD  Procedure: Injection  Fasenra (Benralizumab), Dose: 30 mg, Site: subcutaneous, Number of injections: 1  Post Care:  N/A  Discharge: Condition: Good, Destination: Home . AVS provided to patient.   Performed by:  Sandie Ano, RN

## 2022-10-11 DIAGNOSIS — J301 Allergic rhinitis due to pollen: Secondary | ICD-10-CM | POA: Diagnosis not present

## 2022-10-12 DIAGNOSIS — J301 Allergic rhinitis due to pollen: Secondary | ICD-10-CM | POA: Diagnosis not present

## 2022-10-12 NOTE — Telephone Encounter (Signed)
I spoke with patient regarding her insurance and if she converted to Part D. She states she put it out of her head. She states that she'd like to continue to receive at infusion center until she is required to change to Part D. She states she hasn't had issues thus far with other medications and does not want to cause any major changes to coverage. I requested she reach back out if she does end up enrolling into Part D plan and the pharmacy team in clinic can re-investigate benefits for her.  She verbalized understanding. Closing encounter  Knox Saliva, PharmD, MPH, BCPS, CPP Clinical Pharmacist (Rheumatology and Pulmonology)

## 2022-10-17 ENCOUNTER — Telehealth: Payer: Self-pay

## 2022-10-17 NOTE — Patient Outreach (Signed)
  Care Coordination   10/17/2022 Name: Chloee Tena MRN: [id number] DOB: 1946/03/22   Care Coordination Outreach Attempts:  An unsuccessful telephone outreach was attempted today to offer the patient information about available care coordination services as a benefit of their health plan.   Follow Up Plan:  Additional outreach attempts will be made to offer the patient care coordination information and services.   Encounter Outcome:  No Answer   Care Coordination Interventions:  No, not indicated      Jone Baseman, RN, MSN [place] [facility] Care Management Coordinator Direct Line [phone]

## 2022-10-19 DIAGNOSIS — J301 Allergic rhinitis due to pollen: Secondary | ICD-10-CM | POA: Diagnosis not present

## 2022-10-26 DIAGNOSIS — J301 Allergic rhinitis due to pollen: Secondary | ICD-10-CM | POA: Diagnosis not present

## 2022-11-01 ENCOUNTER — Telehealth: Payer: Self-pay

## 2022-11-01 NOTE — Patient Outreach (Signed)
  Care Coordination   11/01/2022 Name: Rachael Silva MRN: [id number] DOB: 20-Mar-1946   Care Coordination Outreach Attempts:  A second unsuccessful outreach was attempted today to offer the patient with information about available care coordination services as a benefit of their health plan.     Follow Up Plan:  Additional outreach attempts will be made to offer the patient care coordination information and services.   Encounter Outcome:  No Answer   Care Coordination Interventions:  No, not indicated    Jone Baseman, RN, MSN [place] [facility] Care Management Coordinator Direct Line [phone]

## 2022-11-02 ENCOUNTER — Encounter: Payer: Self-pay | Admitting: Internal Medicine

## 2022-11-09 DIAGNOSIS — J301 Allergic rhinitis due to pollen: Secondary | ICD-10-CM | POA: Diagnosis not present

## 2022-11-10 ENCOUNTER — Telehealth: Payer: Self-pay

## 2022-11-10 NOTE — Patient Outreach (Signed)
  Care Coordination   11/10/2022 Name: Rachael Silva MRN: [id number] DOB: 05-30-1946   Care Coordination Outreach Attempts:  A third unsuccessful outreach was attempted today to offer the patient with information about available care coordination services as a benefit of their health plan.   Follow Up Plan:  Additional outreach attempts will be made to offer the patient care coordination information and services.   Encounter Outcome:  No Answer   Care Coordination Interventions:  No, not indicated    Jone Baseman, RN, MSN [place] [facility] Care Management Coordinator Direct Line [phone]

## 2022-11-15 DIAGNOSIS — J301 Allergic rhinitis due to pollen: Secondary | ICD-10-CM | POA: Diagnosis not present

## 2022-11-17 ENCOUNTER — Telehealth: Payer: Self-pay | Admitting: Nurse Practitioner

## 2022-11-17 NOTE — Telephone Encounter (Signed)
Called and spoke with pt she want to make an appt for Flu shot and Rsv . Made her an appt for flu and advised pt the we didn't have the RSV here  she will need to that at the pharmacy. Made an appt for patient to come in to have flu shot.

## 2022-11-17 NOTE — Telephone Encounter (Signed)
I was gonna call and schedule a follow up for patient but wasn't sure when she was due for her next appointment

## 2022-11-21 ENCOUNTER — Ambulatory Visit (INDEPENDENT_AMBULATORY_CARE_PROVIDER_SITE_OTHER): Payer: Medicare Other

## 2022-11-21 DIAGNOSIS — Z23 Encounter for immunization: Secondary | ICD-10-CM | POA: Diagnosis not present

## 2022-11-21 NOTE — Progress Notes (Signed)
  After obtaining verbal consent from patient, and per standing orders, injection of Fluad given IM in the left deltoid by Alinda Dooms [place]White. Patient instructed to remain in clinic for 20 minutes afterwards, and to report any adverse reaction to me immediately.

## 2022-11-24 ENCOUNTER — Ambulatory Visit (INDEPENDENT_AMBULATORY_CARE_PROVIDER_SITE_OTHER): Payer: Medicare Other

## 2022-11-24 VITALS — Ht 66.0 in | Wt 250.0 lb

## 2022-11-24 DIAGNOSIS — Z0000 Encounter for general adult medical examination without abnormal findings: Secondary | ICD-10-CM

## 2022-11-24 NOTE — Patient Instructions (Signed)
[name] , Thank you for taking time to come for your Medicare Wellness Visit. I appreciate your ongoing commitment to your health goals. Please review the following plan we discussed and let me know if I can assist you in the future.   These are the goals we discussed:  Goals      Patient Stated     11/23/2021, wants to lose 20 pounds by next year     Patient Stated     11/24/2022, wants to weigh 240 pounds     Weight (lb) < 250 lb (113.4 kg)        This is a list of the screening recommended for you and due dates:  Health Maintenance  Topic Date Due   DTaP/Tdap/Td vaccine (2 - Tdap) 10/09/2013   Complete foot exam   12/22/2020   Eye exam for diabetics  01/26/2021   COVID-19 Vaccine (3 - 2023-24 season) 06/09/2022   Hemoglobin A1C  12/19/2022   Yearly kidney function blood test for diabetes  06/21/2023   Yearly kidney health urinalysis for diabetes  06/21/2023   Medicare Annual Wellness Visit  11/25/2023   Mammogram  01/17/2024   Colon Cancer Screening  12/15/2031   Pneumonia Vaccine  Completed   Flu Shot  Completed   DEXA scan (bone density measurement)  Completed   Hepatitis C Screening: USPSTF Recommendation to screen - Ages 41-79 yo.  Completed   Zoster (Shingles) Vaccine  Completed   HPV Vaccine  Aged Out    Advanced directives: Advance directive discussed with you today.   Conditions/risks identified: none  Next appointment: Follow up in one year for your annual wellness visit    Preventive Care 65 Years and Older, Female Preventive care refers to lifestyle choices and visits with your health care provider that can promote health and wellness. What does preventive care include? A yearly physical exam. This is also called an annual well check. Dental exams once or twice a year. Routine eye exams. Ask your health care provider how often you should have your eyes checked. Personal lifestyle choices, including: Daily care of your teeth and gums. Regular physical  activity. Eating a healthy diet. Avoiding tobacco and drug use. Limiting alcohol use. Practicing safe sex. Taking low-dose aspirin every day. Taking vitamin and mineral supplements as recommended by your health care provider. What happens during an annual well check? The services and screenings done by your health care provider during your annual well check will depend on your age, overall health, lifestyle risk factors, and family history of disease. Counseling  Your health care provider may ask you questions about your: Alcohol use. Tobacco use. Drug use. Emotional well-being. Home and relationship well-being. Sexual activity. Eating habits. History of falls. Memory and ability to understand (cognition). Work and work Statistician. Reproductive health. Screening  You may have the following tests or measurements: Height, weight, and BMI. Blood pressure. Lipid and cholesterol levels. These may be checked every 5 years, or more frequently if you are over 98 years old. Skin check. Lung cancer screening. You may have this screening every year starting at age 2 if you have a 30-pack-year history of smoking and currently smoke or have quit within the past 15 years. Fecal occult blood test (FOBT) of the stool. You may have this test every year starting at age 109. Flexible sigmoidoscopy or colonoscopy. You may have a sigmoidoscopy every 5 years or a colonoscopy every 10 years starting at age 57. Hepatitis C blood test. Hepatitis  B blood test. Sexually transmitted disease (STD) testing. Diabetes screening. This is done by checking your blood sugar (glucose) after you have not eaten for a while (fasting). You may have this done every 1-3 years. Bone density scan. This is done to screen for osteoporosis. You may have this done starting at age 63. Mammogram. This may be done every 1-2 years. Talk to your health care provider about how often you should have regular mammograms. Talk with your  health care provider about your test results, treatment options, and if necessary, the need for more tests. Vaccines  Your health care provider may recommend certain vaccines, such as: Influenza vaccine. This is recommended every year. Tetanus, diphtheria, and acellular pertussis (Tdap, Td) vaccine. You may need a Td booster every 10 years. Zoster vaccine. You may need this after age 65. Pneumococcal 13-valent conjugate (PCV13) vaccine. One dose is recommended after age 36. Pneumococcal polysaccharide (PPSV23) vaccine. One dose is recommended after age 20. Talk to your health care provider about which screenings and vaccines you need and how often you need them. This information is not intended to replace advice given to you by your health care provider. Make sure you discuss any questions you have with your health care provider. Document Released: 10/22/2015 Document Revised: 06/14/2016 Document Reviewed: 07/27/2015 Elsevier Interactive Patient Education  2017 [place] Prevention in the Home Falls can cause injuries. They can happen to people of all ages. There are many things you can do to make your home safe and to help prevent falls. What can I do on the outside of my home? Regularly fix the edges of walkways and driveways and fix any cracks. Remove anything that might make you trip as you walk through a door, such as a raised step or threshold. Trim any bushes or trees on the path to your home. Use bright outdoor lighting. Clear any walking paths of anything that might make someone trip, such as rocks or tools. Regularly check to see if handrails are loose or broken. Make sure that both sides of any steps have handrails. Any raised decks and porches should have guardrails on the edges. Have any leaves, snow, or ice cleared regularly. Use sand or salt on walking paths during winter. Clean up any spills in your garage right away. This includes oil or grease spills. What can I  do in the bathroom? Use night lights. Install grab bars by the toilet and in the tub and shower. Do not use towel bars as grab bars. Use non-skid mats or decals in the tub or shower. If you need to sit down in the shower, use a plastic, non-slip stool. Keep the floor dry. Clean up any water that spills on the floor as soon as it happens. Remove soap buildup in the tub or shower regularly. Attach bath mats securely with double-sided non-slip rug tape. Do not have throw rugs and other things on the floor that can make you trip. What can I do in the bedroom? Use night lights. Make sure that you have a light by your bed that is easy to reach. Do not use any sheets or blankets that are too big for your bed. They should not hang down onto the floor. Have a firm chair that has side arms. You can use this for support while you get dressed. Do not have throw rugs and other things on the floor that can make you trip. What can I do in the kitchen? Clean up any  spills right away. Avoid walking on wet floors. Keep items that you use a lot in easy-to-reach places. If you need to reach something above you, use a strong step stool that has a grab bar. Keep electrical cords out of the way. Do not use floor polish or wax that makes floors slippery. If you must use wax, use non-skid floor wax. Do not have throw rugs and other things on the floor that can make you trip. What can I do with my stairs? Do not leave any items on the stairs. Make sure that there are handrails on both sides of the stairs and use them. Fix handrails that are broken or loose. Make sure that handrails are as long as the stairways. Check any carpeting to make sure that it is firmly attached to the stairs. Fix any carpet that is loose or worn. Avoid having throw rugs at the top or bottom of the stairs. If you do have throw rugs, attach them to the floor with carpet tape. Make sure that you have a light switch at the top of the stairs  and the bottom of the stairs. If you do not have them, ask someone to add them for you. What else can I do to help prevent falls? Wear shoes that: Do not have high heels. Have rubber bottoms. Are comfortable and fit you well. Are closed at the toe. Do not wear sandals. If you use a stepladder: Make sure that it is fully opened. Do not climb a closed stepladder. Make sure that both sides of the stepladder are locked into place. Ask someone to hold it for you, if possible. Clearly mark and make sure that you can see: Any grab bars or handrails. First and last steps. Where the edge of each step is. Use tools that help you move around (mobility aids) if they are needed. These include: Canes. Walkers. Scooters. Crutches. Turn on the lights when you go into a dark area. Replace any light bulbs as soon as they burn out. Set up your furniture so you have a clear path. Avoid moving your furniture around. If any of your floors are uneven, fix them. If there are any pets around you, be aware of where they are. Review your medicines with your doctor. Some medicines can make you feel dizzy. This can increase your chance of falling. Ask your doctor what other things that you can do to help prevent falls. This information is not intended to replace advice given to you by your health care provider. Make sure you discuss any questions you have with your health care provider. Document Released: 07/22/2009 Document Revised: 03/02/2016 Document Reviewed: 10/30/2014 Elsevier Interactive Patient Education  2017 [employer].

## 2022-11-24 NOTE — Progress Notes (Signed)
I connected with  Rachael Silva on 11/24/22 by a audio enabled telemedicine application and verified that I am speaking with the correct person using two identifiers.  Patient Location: Home  Provider Location: Office/Clinic  I discussed the limitations of evaluation and management by telemedicine. The patient expressed understanding and agreed to proceed.  Subjective:   Rachael Silva is a 77 y.o. female who presents for Medicare Annual (Subsequent) preventive examination.  Review of Systems      Cardiac Risk Factors include: advanced age (>61mn, >>56women);hypertension;obesity (BMI >30kg/m2)     Objective:    Today's Vitals   11/24/22 1257  Weight: 250 lb (113.4 kg)  Height: 5' 6"$  (1.676 m)   Body mass index is 40.35 kg/m.     11/23/2021    3:22 PM 08/19/2020   11:19 AM 08/06/2019    2:37 PM 07/01/2018    2:54 PM 02/25/2018    9:01 AM 02/15/2018   12:00 AM 11/07/2017    2:12 PM  Advanced Directives  Does Patient Have a Medical Advance Directive? No No No No No No No  Would patient like information on creating a medical advance directive?  No - Patient declined No - Patient declined No - Patient declined No - Patient declined No - Patient declined No - Patient declined    Current Medications (verified) Outpatient Encounter Medications as of 11/24/2022  Medication Sig   albuterol (VENTOLIN HFA) 108 (90 Base) MCG/ACT inhaler TAKE 2 PUFFS BY MOUTH EVERY 6 HOURS AS NEEDED FOR WHEEZE OR SHORTNESS OF BREATH   B Complex-C-Folic Acid (STRESS B COMPLEX PO) Take 1 tablet by mouth daily.   benzonatate (TESSALON) 200 MG capsule Take 1 capsule (200 mg total) by mouth 3 (three) times daily as needed.   BREO ELLIPTA 200-25 MCG/ACT AEPB INHALE 1 PUFF BY MOUTH EVERY DAY   Calcium Carbonate-Vitamin D (CALTRATE 600+D PO) Take 1 tablet by mouth 2 (two) times daily.   cetirizine (ZYRTEC) 10 MG tablet TAKE 1 TABLET BY MOUTH EVERY DAY   co-enzyme Q-10 30 MG capsule Take 200 mg by mouth  daily.   EPIPEN 2-PAK 0.3 MG/0.3ML SOAJ injection See admin instructions.   ezetimibe (ZETIA) 10 MG tablet TAKE 1 TABLET BY MOUTH EVERY DAY   fluticasone furoate-vilanterol (BREO ELLIPTA) 100-25 MCG/ACT AEPB Inhale 1 puff into the lungs daily.   furosemide (LASIX) 20 MG tablet TAKE 1/2 TABLET BY MOUTH EVERY DAY   GARLIC PO Take 1 capsule by mouth daily.   montelukast (SINGULAIR) 10 MG tablet Take 1 tablet (10 mg total) by mouth daily.   omeprazole (PRILOSEC) 20 MG capsule Take 1 capsule (20 mg total) by mouth daily.   cefdinir (OMNICEF) 300 MG capsule Take 1 capsule (300 mg total) by mouth 2 (two) times daily. (Patient not taking: Reported on 11/24/2022)   Facility-Administered Encounter Medications as of 11/24/2022  Medication   omalizumab (Arvid Right injection 375 mg   omalizumab (Arvid Right injection 375 mg    Allergies (verified) Lisinopril, Atorvastatin, and Erythromycin   History: Past Medical History:  Diagnosis Date   Allergy    allergic rhinitis   Angioedema 02/2006   Arthritis    OA of knees   Asthma    Complication of anesthesia    apnea- sleep   Frequent headaches    Glucocorticoids and synthetic analogues causing adverse effect in therapeutic use 08/27/2014   Hiatal hernia    Hypertension    Hypothyroid    Labile blood pressure    Long  term current use of systemic steroids 08/27/2014   Lower extremity edema    Lung disease    Pneumonia    Pneumonia, eosinophilic (HCC)    Productive cough 05/27/2014   Shortness of breath dyspnea    Sleep apnea    Urinary incontinence    Wheezing    Past Surgical History:  Procedure Laterality Date   BREAST BIOPSY Left 2013   NEG   BREAST SURGERY  1966   breast biopsy   CESAREAN SECTION     x2   FLEXIBLE BRONCHOSCOPY N/A 09/16/2015   Procedure: FLEXIBLE BRONCHOSCOPY WITH FLURO ;  Surgeon: Vilinda Boehringer, MD;  Location: [facility] ORS;  Service: Pulmonary;  Laterality: N/A;   Family History  Problem Relation Age of Onset    Diabetes Mother        pre-diabetic   Heart disease Mother        ? heart disease   Emphysema Mother    Thyroid disease Mother    Diabetes Maternal Uncle    Heart disease Father    Breast cancer Neg Hx    Social History   Socioeconomic History   Marital status: Single    Spouse name: Not on file   Number of children: 2   Years of education: college   Highest education level: Not on file  Occupational History   Occupation: retired    Fish farm manager: RETIRED  Tobacco Use   Smoking status: Never   Smokeless tobacco: Never  Vaping Use   Vaping Use: Never used  Substance and Sexual Activity   Alcohol use: No    Alcohol/week: 0.0 standard drinks of alcohol   Drug use: No   Sexual activity: Never  Other Topics Concern   Not on file  Social History Narrative   Lives alone   Left handed    Caffeine use: 1 cup per day   Social Determinants of Health   Financial Resource Strain: Low Risk  (11/24/2022)   Overall Financial Resource Strain (CARDIA)    Difficulty of Paying Living Expenses: Not hard at all  Food Insecurity: No Food Insecurity (11/24/2022)   Hunger Vital Sign    Worried About Running Out of Food in the Last Year: Never true    [place] in the Last Year: Never true  Transportation Needs: No Transportation Needs (11/24/2022)   PRAPARE - Hydrologist (Medical): No    Lack of Transportation (Non-Medical): No  Physical Activity: Inactive (11/24/2022)   Exercise Vital Sign    Days of Exercise per Week: 0 days    Minutes of Exercise per Session: 0 min  Stress: No Stress Concern Present (11/24/2022)   [place]    Feeling of Stress : Not at all  Social Connections: Socially Isolated (08/19/2020)   Social Connection and Isolation Panel [NHANES]    Frequency of Communication with Friends and Family: More than three times a week    Frequency of Social Gatherings with Friends  and Family: More than three times a week    Attends Religious Services: Never    Marine scientist or Organizations: No    Attends Music therapist: Never    Marital Status: Never married    Tobacco Counseling Counseling given: Not Answered   Clinical Intake:  Pre-visit preparation completed: Yes  Pain : No/denies pain     Nutritional Status: BMI > 30  Obese Nutritional Risks: None  Diabetes: No  How often do you need to have someone help you when you read instructions, pamphlets, or other written materials from your doctor or pharmacy?: 1 - Never  Diabetic? no     Information entered by :: NAllen LPN   Activities of Daily Living    11/24/2022    1:04 PM  In your present state of health, do you have any difficulty performing the following activities:  Hearing? 1  Comment has hearing aids  Vision? 0  Difficulty concentrating or making decisions? 0  Walking or climbing stairs? 0  Dressing or bathing? 0  Doing errands, shopping? 0  Preparing Food and eating ? N  Using the Toilet? N  In the past six months, have you accidently leaked urine? Y  Comment incontinence of urine  Do you have problems with loss of bowel control? N  Managing your Medications? N  Managing your Finances? N  Housekeeping or managing your Housekeeping? N    Patient Care Team: Nche, Charlene Brooke, NP as PCP - General (Internal Medicine) Marlowe Sax, MD as Referring Physician (Internal Medicine) Nche, Charlene Brooke, NP as Nurse Practitioner (Internal Medicine)  Indicate any recent Medical Services you may have received from other than [facility] providers in the past year (date may be approximate).     Assessment:   This is a routine wellness examination for Lucrecia.  Hearing/Vision screen Vision Screening - Comments:: No regular eye exams, [facility]  Dietary issues and exercise activities discussed: Current Exercise Habits: The patient does not  participate in regular exercise at present   Goals Addressed             This Visit's Progress    Patient Stated       11/24/2022, wants to weigh 240 pounds       Depression Screen    11/24/2022    1:04 PM 11/23/2021    3:22 PM 08/19/2020   11:21 AM 12/23/2019   10:24 AM 08/06/2019    2:38 PM 06/24/2019    1:52 PM 11/07/2017    2:12 PM  PHQ 2/9 Scores  PHQ - 2 Score 0 0 0 0 0 0 0    Fall Risk    11/24/2022    1:02 PM 11/23/2021    3:22 PM 09/23/2021   11:31 AM 08/19/2020   11:21 AM 08/09/2020   11:38 AM  [place] in the past year? 1 0 0 0 0  Comment got tripped up      Number falls in past yr: 0  0 0   Injury with Fall? 0  0 0   Risk for fall due to : Medication side effect Medication side effect No Fall Risks    Follow up Falls prevention discussed;Education provided;Falls evaluation completed Falls evaluation completed;Education provided;Falls prevention discussed Falls evaluation completed Falls prevention discussed     FALL RISK PREVENTION PERTAINING TO THE HOME:  Any stairs in or around the home? Yes  If so, are there any without handrails? No  Home free of loose throw rugs in walkways, pet beds, electrical cords, etc? Yes  Adequate lighting in your home to reduce risk of falls? Yes   ASSISTIVE DEVICES UTILIZED TO PREVENT FALLS:  Life alert? No  Use of a cane, walker or w/c? Yes  Grab bars in the bathroom? No  Shower chair or bench in shower? No  Elevated toilet seat or a handicapped toilet? Yes   TIMED UP AND GO:  Was  the test performed? No .       Cognitive Function:    11/07/2017    2:13 PM 05/31/2016    9:38 AM  MMSE - Mini Mental State Exam  Orientation to time 5 5  Orientation to Place 5 5  Registration 3 3  Attention/ Calculation 5 0  Recall 3 2  Recall-comments  pt was unable to recall 1 of 3 words  Language- name 2 objects 2 0  Language- repeat 1 1  Language- follow 3 step command 3 3  Language- read & follow direction 1 0   Write a sentence 1 0  Copy design 1 0  Total score 30 19        11/24/2022    1:05 PM 11/23/2021    3:24 PM  6CIT Screen  What Year? 0 points 0 points  What month? 0 points 0 points  What time? 3 points 0 points  Count back from 20 0 points 0 points  Months in reverse 0 points 0 points  Repeat phrase 0 points 4 points  Total Score 3 points 4 points    Immunizations Immunization History  Administered Date(s) Administered   Fluad Quad(high Dose 65+) 06/10/2019, 08/09/2020, 08/09/2021, 11/21/2022   Influenza Split 07/27/2011, 08/19/2012   Influenza Whole 08/01/2007, 07/13/2008, 07/20/2009, 07/20/2010   Influenza, High Dose Seasonal PF 07/16/2018   Influenza,inj,Quad PF,6+ Mos 07/29/2013, 07/15/2014, 07/09/2015, 07/14/2016, 06/19/2017   PFIZER(Purple Top)SARS-COV-2 Vaccination 12/01/2019, 12/24/2019   Pneumococcal Conjugate-13 05/31/2016   Pneumococcal Polysaccharide-23 10/09/2001, 01/13/2014   Td 10/10/2003   Zoster Recombinat (Shingrix) 11/25/2020, 02/08/2021    TDAP status: Due, Education has been provided regarding the importance of this vaccine. Advised may receive this vaccine at local pharmacy or Health Dept. Aware to provide a copy of the vaccination record if obtained from local pharmacy or Health Dept. Verbalized acceptance and understanding.  Flu Vaccine status: Up to date  Pneumococcal vaccine status: Up to date  Covid-19 vaccine status: Completed vaccines  Qualifies for Shingles Vaccine? Yes   Zostavax completed Yes   Shingrix Completed?: Yes  Screening Tests Health Maintenance  Topic Date Due   DTaP/Tdap/Td (2 - Tdap) 10/09/2013   FOOT EXAM  12/22/2020   OPHTHALMOLOGY EXAM  01/26/2021   COVID-19 Vaccine (3 - 2023-24 season) 06/09/2022   Medicare Annual Wellness (AWV)  11/23/2022   HEMOGLOBIN A1C  12/19/2022   Diabetic kidney evaluation - eGFR measurement  06/21/2023   Diabetic kidney evaluation - Urine ACR  06/21/2023   MAMMOGRAM  01/17/2024    COLONOSCOPY (Pts 45-72yr Insurance coverage will need to be confirmed)  12/15/2031   Pneumonia Vaccine 75 Years old  Completed   INFLUENZA VACCINE  Completed   DEXA SCAN  Completed   Hepatitis C Screening  Completed   Zoster Vaccines- Shingrix  Completed   HPV VACCINES  Aged Out    Health Maintenance  Health Maintenance Due  Topic Date Due   DTaP/Tdap/Td (2 - Tdap) 10/09/2013   FOOT EXAM  12/22/2020   OPHTHALMOLOGY EXAM  01/26/2021   COVID-19 Vaccine (3 - 2023-24 season) 06/09/2022   Medicare Annual Wellness (AWV)  11/23/2022    Colorectal cancer screening: Type of screening: Cologuard. Completed 12/14/2021. Repeat every 3 years  Mammogram status: scheduled for 01/18/2023  Bone Density status: Completed 09/08/2019.   Lung Cancer Screening: (Low Dose CT Chest recommended if Age 75-80years, 30 pack-year currently smoking OR have quit w/in 15years.) does not qualify.   Lung Cancer Screening Referral: no  Additional Screening:  Hepatitis C Screening: does qualify; Completed 05/31/2016  Vision Screening: Recommended annual ophthalmology exams for early detection of glaucoma and other disorders of the eye. Is the patient up to date with their annual eye exam?  Yes  Who is the provider or what is the name of the office in which the patient attends annual eye exams? [place] If pt is not established with a provider, would they like to be referred to a provider to establish care? No .   Dental Screening: Recommended annual dental exams for proper oral hygiene  Community Resource Referral / Chronic Care Management: CRR required this visit?  No   CCM required this visit?  No      Plan:     I have personally reviewed and noted the following in the patient's chart:   Medical and social history Use of alcohol, tobacco or illicit drugs  Current medications and supplements including opioid prescriptions. Patient is not currently taking opioid prescriptions. Functional  ability and status Nutritional status Physical activity Advanced directives List of other physicians Hospitalizations, surgeries, and ER visits in previous 12 months Vitals Screenings to include cognitive, depression, and falls Referrals and appointments  In addition, I have reviewed and discussed with patient certain preventive protocols, quality metrics, and best practice recommendations. A written personalized care plan for preventive services as well as general preventive health recommendations were provided to patient.     Kellie Simmering, LPN   [id number]   Nurse Notes: none  Due to this being a virtual visit, the after visit summary with patients personalized plan was offered to patient via mail or my-chart.  Patient would like to access on my-chart

## 2022-11-29 DIAGNOSIS — J301 Allergic rhinitis due to pollen: Secondary | ICD-10-CM | POA: Diagnosis not present

## 2022-11-30 ENCOUNTER — Encounter: Payer: Medicare Other | Admitting: Pulmonary Disease

## 2022-11-30 MED ORDER — BENRALIZUMAB 30 MG/ML ~~LOC~~ SOSY: 30.0000 mg | PREFILLED_SYRINGE | Freq: Once | SUBCUTANEOUS | Status: DC

## 2022-12-01 ENCOUNTER — Ambulatory Visit (INDEPENDENT_AMBULATORY_CARE_PROVIDER_SITE_OTHER): Payer: Medicare Other

## 2022-12-01 VITALS — BP 145/88 | HR 91 | Temp 98.1°F | Resp 18 | Ht 66.0 in | Wt 252.6 lb

## 2022-12-01 DIAGNOSIS — J4550 Severe persistent asthma, uncomplicated: Secondary | ICD-10-CM | POA: Diagnosis not present

## 2022-12-01 MED ORDER — BENRALIZUMAB 30 MG/ML ~~LOC~~ SOSY
30.0000 mg | PREFILLED_SYRINGE | Freq: Once | SUBCUTANEOUS | Status: AC
  Administered 2022-12-01: 30 mg via SUBCUTANEOUS
  Filled 2022-12-01: qty 1

## 2022-12-01 NOTE — Progress Notes (Signed)
Diagnosis: Asthma  Provider:  Marshell Garfinkel MD  Procedure: Injection  Fasenra (Benralizumab), Dose: 30 mg, Site: subcutaneous, Number of injections: 1  Post Care:  n/a  Discharge: Condition: Good, Destination: Home . AVS Declined  Performed by:  Cleophus Molt, RN

## 2022-12-06 ENCOUNTER — Encounter: Payer: Self-pay | Admitting: Internal Medicine

## 2022-12-06 DIAGNOSIS — J301 Allergic rhinitis due to pollen: Secondary | ICD-10-CM | POA: Diagnosis not present

## 2022-12-09 ENCOUNTER — Other Ambulatory Visit: Payer: Self-pay | Admitting: Nurse Practitioner

## 2022-12-09 DIAGNOSIS — I10 Essential (primary) hypertension: Secondary | ICD-10-CM

## 2022-12-12 ENCOUNTER — Ambulatory Visit: Payer: Medicare Other | Admitting: Nurse Practitioner

## 2022-12-13 DIAGNOSIS — J301 Allergic rhinitis due to pollen: Secondary | ICD-10-CM | POA: Diagnosis not present

## 2022-12-20 ENCOUNTER — Ambulatory Visit (INDEPENDENT_AMBULATORY_CARE_PROVIDER_SITE_OTHER): Payer: Medicare Other | Admitting: Nurse Practitioner

## 2022-12-20 ENCOUNTER — Encounter: Payer: Self-pay | Admitting: Nurse Practitioner

## 2022-12-20 VITALS — BP 124/70 | HR 82 | Resp 16 | Ht 67.0 in | Wt 251.0 lb

## 2022-12-20 DIAGNOSIS — J4540 Moderate persistent asthma, uncomplicated: Secondary | ICD-10-CM | POA: Diagnosis not present

## 2022-12-20 DIAGNOSIS — J301 Allergic rhinitis due to pollen: Secondary | ICD-10-CM | POA: Diagnosis not present

## 2022-12-20 DIAGNOSIS — T380X5S Adverse effect of glucocorticoids and synthetic analogues, sequela: Secondary | ICD-10-CM | POA: Diagnosis not present

## 2022-12-20 DIAGNOSIS — E782 Mixed hyperlipidemia: Secondary | ICD-10-CM

## 2022-12-20 DIAGNOSIS — E099 Drug or chemical induced diabetes mellitus without complications: Secondary | ICD-10-CM

## 2022-12-20 DIAGNOSIS — Z6839 Body mass index (BMI) 39.0-39.9, adult: Secondary | ICD-10-CM

## 2022-12-20 DIAGNOSIS — I10 Essential (primary) hypertension: Secondary | ICD-10-CM

## 2022-12-20 LAB — LIPID PANEL
Cholesterol: 195 mg/dL (ref 0–200)
HDL: 75.9 mg/dL (ref >39.00)
LDL Cholesterol: 110 mg/dL — ABNORMAL HIGH (ref 0–99)
NonHDL: 118.67
Total CHOL/HDL Ratio: 3
Triglycerides: 42 mg/dL (ref 0.0–149.0)
VLDL: 8.4 mg/dL (ref 0.0–40.0)

## 2022-12-20 LAB — BASIC METABOLIC PANEL
BUN: 17 mg/dL (ref 6–23)
CO2: 29 mEq/L (ref 19–32)
Calcium: 9.6 mg/dL (ref 8.4–10.5)
Chloride: 104 mEq/L (ref 96–112)
Creatinine, Ser: 0.93 mg/dL (ref 0.40–1.20)
GFR: 60.39 mL/min (ref >60.00)
Glucose, Bld: 87 mg/dL (ref 70–99)
Potassium: 4.3 mEq/L (ref 3.5–5.1)
Sodium: 139 mEq/L (ref 135–145)

## 2022-12-20 LAB — HEMOGLOBIN A1C: Hgb A1c MFr Bld: 6 % (ref 4.6–6.5)

## 2022-12-20 NOTE — Assessment & Plan Note (Signed)
BP at goal with furosemide daily BP Readings from Last 3 Encounters:  12/20/22 124/70  12/01/22 (!) 145/88  10/05/22 (!) 156/83   Continue med dose Repeat BMP

## 2022-12-20 NOTE — Patient Instructions (Signed)
Take breo 200 daily x 33month then decrease to 100 daily continuously till appt with pulmonologist. Rinse mouth after each use Schedule appt with ophthalmology. Have report faxed to me Go to lab

## 2022-12-20 NOTE — Progress Notes (Signed)
Established Patient Visit  Patient: Rachael Silva   DOB: Nov 18, 1945   77 y.o. Female  MRN: [id number] Visit Date: 12/20/2022  Subjective:    Chief Complaint  Patient presents with   Medical Management of Chronic Issues    HtN, lungs    HPI Asthma, chronic Use of proair daily Does not use breo daily and did not change inhaler dose as directed by pulmonology. Reports daily cough and SOB with exertion.  Advised about difference between breo and proair. Advised to use breo daily and proair prn. She verbalized understanding Maintain appt with pulmonology  Steroid-induced diabetes mellitus ([place]) No medication, no glucose check Repeat hgbA1c and renal function Advised to schedule DM eye exam Stable neuropathy, denies need for medication. No nephropathy  Essential hypertension BP at goal with furosemide daily BP Readings from Last 3 Encounters:  12/20/22 124/70  12/01/22 (!) 145/88  10/05/22 (!) 156/83   Continue med dose Repeat BMP  Obesity Advised about need for weight loss through diet modification and exercise. With limited mobility (knee pain), advised to do chair exercise and use of resistant bands. Wt Readings from Last 3 Encounters:  12/20/22 251 lb (113.9 kg)  12/01/22 252 lb 9.6 oz (114.6 kg)  11/24/22 250 lb (113.4 kg)     Hyperlipidemia Repeat lipid panel  BP Readings from Last 3 Encounters:  12/20/22 124/70  12/01/22 (!) 145/88  10/05/22 (!) 156/83    Wt Readings from Last 3 Encounters:  12/20/22 251 lb (113.9 kg)  12/01/22 252 lb 9.6 oz (114.6 kg)  11/24/22 250 lb (113.4 kg)    Reviewed medical, surgical, and social history today  Medications: Outpatient Medications Prior to Visit  Medication Sig   albuterol (VENTOLIN HFA) 108 (90 Base) MCG/ACT inhaler TAKE 2 PUFFS BY MOUTH EVERY 6 HOURS AS NEEDED FOR WHEEZE OR SHORTNESS OF BREATH   B Complex-C-Folic Acid (STRESS B COMPLEX PO) Take 1 tablet by mouth daily.   BREO ELLIPTA 200-25  MCG/ACT AEPB INHALE 1 PUFF BY MOUTH EVERY DAY   Calcium Carbonate-Vitamin D (CALTRATE 600+D PO) Take 1 tablet by mouth 2 (two) times daily.   cetirizine (ZYRTEC) 10 MG tablet TAKE 1 TABLET BY MOUTH EVERY DAY   co-enzyme Q-10 30 MG capsule Take 200 mg by mouth daily.   EPIPEN 2-PAK 0.3 MG/0.3ML SOAJ injection See admin instructions.   ezetimibe (ZETIA) 10 MG tablet TAKE 1 TABLET BY MOUTH EVERY DAY   fluticasone furoate-vilanterol (BREO ELLIPTA) 100-25 MCG/ACT AEPB Inhale 1 puff into the lungs daily.   furosemide (LASIX) 20 MG tablet TAKE 1/2 TABLET BY MOUTH DAILY   GARLIC PO Take 1 capsule by mouth daily.   montelukast (SINGULAIR) 10 MG tablet Take 1 tablet (10 mg total) by mouth daily.   omeprazole (PRILOSEC) 20 MG capsule Take 1 capsule (20 mg total) by mouth daily.   [DISCONTINUED] benzonatate (TESSALON) 200 MG capsule Take 1 capsule (200 mg total) by mouth 3 (three) times daily as needed.   [DISCONTINUED] cefdinir (OMNICEF) 300 MG capsule Take 1 capsule (300 mg total) by mouth 2 (two) times daily. (Patient not taking: Reported on 11/24/2022)   Facility-Administered Medications Prior to Visit  Medication Dose Route Frequency Provider   omalizumab Arvid Right) injection 375 mg  375 mg Subcutaneous Q14 Days Flora Lipps, MD   omalizumab Arvid Right) injection 375 mg  375 mg Subcutaneous Q14 Days Flora Lipps, MD   Reviewed past medical and  social history.   ROS per HPI above  Last metabolic panel Lab Results  Component Value Date   GLUCOSE 83 06/20/2022   NA 140 06/20/2022   K 4.2 06/20/2022   CL 105 06/20/2022   CO2 28 06/20/2022   BUN 16 06/20/2022   CREATININE 0.98 06/20/2022   GFRNONAA >60 02/25/2018   CALCIUM 9.5 06/20/2022   PHOS 3.4 06/20/2022   PROT 7.2 08/09/2020   ALBUMIN 3.7 06/20/2022   LABGLOB 3.7 10/18/2018   AGRATIO 1.0 10/18/2018   BILITOT 0.6 08/09/2020   ALKPHOS 85 08/09/2020   AST 12 08/09/2020   ALT 9 08/09/2020   ANIONGAP 8 02/25/2018   Last lipids Lab  Results  Component Value Date   CHOL 202 (H) 03/01/2021   HDL 71.50 03/01/2021   LDLCALC 119 (H) 03/01/2021   LDLDIRECT 89.0 06/20/2022   TRIG 58.0 03/01/2021   CHOLHDL 3 03/01/2021   Last hemoglobin A1c Lab Results  Component Value Date   HGBA1C 6.2 06/20/2022      Objective:  BP 124/70   Pulse 82   Resp 16   Ht '5\' 7"'$  (1.702 m)   Wt 251 lb (113.9 kg)   SpO2 99%   BMI 39.31 kg/m      Physical Exam Cardiovascular:     Rate and Rhythm: Normal rate and regular rhythm.     Pulses: Normal pulses.     Heart sounds: Normal heart sounds.  Pulmonary:     Effort: Pulmonary effort is normal.     Breath sounds: Normal breath sounds.  Neurological:     Mental Status: She is alert and oriented to person, place, and time.     No results found for any visits on 12/20/22.    Assessment & Plan:    Problem List Items Addressed This Visit       Cardiovascular and Mediastinum   Essential hypertension - Primary    BP at goal with furosemide daily BP Readings from Last 3 Encounters:  12/20/22 124/70  12/01/22 (!) 145/88  10/05/22 (!) 156/83  Continue med dose Repeat BMP      Relevant Orders   Basic metabolic panel     Respiratory   Asthma, chronic    Use of proair daily Does not use breo daily and did not change inhaler dose as directed by pulmonology. Reports daily cough and SOB with exertion.  Advised about difference between breo and proair. Advised to use breo daily and proair prn. She verbalized understanding Maintain appt with pulmonology        Endocrine   Steroid-induced diabetes mellitus ([place])    No medication, no glucose check Repeat hgbA1c and renal function Advised to schedule DM eye exam Stable neuropathy, denies need for medication. No nephropathy      Relevant Orders   Hemoglobin A1c     Other   Hyperlipidemia    Repeat lipid panel      Relevant Orders   Lipid panel   Obesity    Advised about need for weight loss through diet  modification and exercise. With limited mobility (knee pain), advised to do chair exercise and use of resistant bands. Wt Readings from Last 3 Encounters:  12/20/22 251 lb (113.9 kg)  12/01/22 252 lb 9.6 oz (114.6 kg)  11/24/22 250 lb (113.4 kg)         Return in about 6 months (around 06/22/2023) for DM, HTN, hyperlipidemia (fasting).     Wilfred Lacy, NP

## 2022-12-20 NOTE — Assessment & Plan Note (Addendum)
Advised about need for weight loss through diet modification and exercise. With limited mobility (knee pain), advised to do chair exercise and use of resistant bands. Wt Readings from Last 3 Encounters:  12/20/22 251 lb (113.9 kg)  12/01/22 252 lb 9.6 oz (114.6 kg)  11/24/22 250 lb (113.4 kg)

## 2022-12-20 NOTE — Assessment & Plan Note (Signed)
No medication, no glucose check Repeat hgbA1c and renal function Advised to schedule DM eye exam Stable neuropathy, denies need for medication. No nephropathy

## 2022-12-20 NOTE — Assessment & Plan Note (Signed)
Use of proair daily Does not use breo daily and did not change inhaler dose as directed by pulmonology. Reports daily cough and SOB with exertion.  Advised about difference between breo and proair. Advised to use breo daily and proair prn. She verbalized understanding Maintain appt with pulmonology

## 2022-12-20 NOTE — Assessment & Plan Note (Signed)
Repeat lipid panel ?

## 2022-12-22 NOTE — Progress Notes (Signed)
Abnormal: Stable hgbA1c and renal function LDL not at goal. Needs to be at 70 or less. Are you will to try crestor 10mg  2x/week?

## 2022-12-26 ENCOUNTER — Telehealth: Payer: Self-pay | Admitting: Nurse Practitioner

## 2022-12-26 DIAGNOSIS — E785 Hyperlipidemia, unspecified: Secondary | ICD-10-CM

## 2022-12-26 DIAGNOSIS — T380X5S Adverse effect of glucocorticoids and synthetic analogues, sequela: Secondary | ICD-10-CM

## 2022-12-26 DIAGNOSIS — I10 Essential (primary) hypertension: Secondary | ICD-10-CM

## 2022-12-26 MED ORDER — ROSUVASTATIN CALCIUM 10 MG PO TABS: 10.0000 mg | ORAL_TABLET | ORAL | 1 refills | Status: DC

## 2022-12-26 NOTE — Telephone Encounter (Signed)
Caller Name: Ol[place] Call back phone #: [phone]  Reason for Call: Pt stated she had a conversation with Baldo Ash about starting on Crestor. She wants to know if she can start ozempic lose weight and would this work instead of the crestor? If so please call in the ozempic.

## 2022-12-27 ENCOUNTER — Encounter: Payer: Medicare Other | Attending: Nurse Practitioner | Admitting: Skilled Nursing Facility1

## 2022-12-27 ENCOUNTER — Encounter: Payer: Self-pay | Admitting: Skilled Nursing Facility1

## 2022-12-27 DIAGNOSIS — J301 Allergic rhinitis due to pollen: Secondary | ICD-10-CM | POA: Diagnosis not present

## 2022-12-27 DIAGNOSIS — E785 Hyperlipidemia, unspecified: Secondary | ICD-10-CM | POA: Insufficient documentation

## 2022-12-27 DIAGNOSIS — E119 Type 2 diabetes mellitus without complications: Secondary | ICD-10-CM | POA: Diagnosis not present

## 2022-12-27 DIAGNOSIS — Z713 Dietary counseling and surveillance: Secondary | ICD-10-CM | POA: Diagnosis not present

## 2022-12-27 NOTE — Progress Notes (Signed)
Medical Nutrition Therapy  Appointment Start time:  8:45  Appointment End time:  9:30  Primary concerns today: to lose weight  Referral diagnosis: hyperlipemia, Type 2 DM Preferred learning style: auditory, visual, hands on Learning readiness: contemplating   NUTRITION ASSESSMENT   Clinical Medical Hx: allergies, arthritis, asthma, OSA, HTN, thyroid disease Medications: see list Labs: A1C 6.0, LDL 110 Notable Signs/Symptoms: knee pain, breathing troubles from COPD  Lifestyle & Dietary Hx  Last A1C at 6.5 or higher was 2018.  Pt states she is not a morning person.  Pt states she realizes when she skips meals she eats more snacks.  Pt states she has heart burn.   Estimated daily fluid intake: unknown oz Supplements: multivitamin, vitamin D + calcium (not taking daily), stress B complex Sleep: 6-7 hours, wears her C-PAP nightly stress / self-care: worries about her 2 year old mother Current average weekly physical activity: ADL's  24-Hr Dietary Recall: snacking throughout the day; eating out a couple times a week; waking around 9-10 First Meal 10-11 : coffee and yogurt and grapefruit- Snack:  Second Meal: skipped  Snack: chips and cashews Third Meal: frozen meals or [place] meat spagetti + canned greens or ricearoni + chicken + green beans Snack: ice cream Beverages: orange juice, apple juice, boiled water, diet gingerale, diet sprite, diet mountain dew  Estimated Energy Needs Calories: 1500   NUTRITION INTERVENTION  Nutrition education (E-1) on the following topics:  Educated pt on the importance of consistently eating throughout the day: Sustained Energy Levels: Regular meals and snacks help maintain stable blood sugar levels, providing a steady source of energy throughout the day. This can prevent energy crashes and help you stay focused and alert. Metabolism Regulation: Eating regularly can help keep your metabolism active and functioning efficiently. Consistent meals  can prevent your body from going into "starvation mode," where it conserves energy and slows down metabolism. Nutrient Absorption: Spacing out meals allows your body to better absorb and utilize nutrients from food. Eating throughout the day ensures that your body receives a continuous supply of essential vitamins, minerals, and other nutrients. Appetite Control: Regular meals and snacks can help regulate appetite and prevent overeating. When you go too long without eating, you may become overly hungry and more likely to overindulge or make unhealthy food choices. Blood Sugar Regulation: Eating at regular intervals helps prevent spikes and crashes in blood sugar levels. This is particularly important for individuals with diabetes or those at risk of developing diabetes. Improved Mood and Concentration: Balanced, regular meals can positively impact your mood and cognitive function. Stable blood sugar levels support brain function, leading to better concentration, memory, and overall mental well-being. Muscle Maintenance: Consuming protein-rich snacks or meals throughout the day can help support muscle repair and maintenance, especially for individuals who are physically active or engaged in strength training. Overall, eating throughout the day supports your body's needs for energy, nutrients, and overall health, promoting a balanced and sustainable approach to nutrition  Handouts Provided Include  Emailed to pt  Learning Style & Readiness for Change Teaching method utilized: Visual & Auditory  Demonstrated degree of understanding via: Teach Back  Barriers to learning/adherence to lifestyle change: none identified   Goals Established by Pt Do a YouTube chair video daily: sent to your email alternating with your bike aiming for 15-30 minutes if tolerable Breath through your workouts, do not hold your breath First the thing you do when you get home after visiting your momma before you go into  the  kitchen is to do a youtube chair video Have 1 whole fruit serving per day Have 1 whole serving of a non starchy vegetable per day   MONITORING & EVALUATION Dietary intake, weekly physical activity  Next Steps  Patient is to follow up: pt verbalizes a lack of desire for follow up at this time stating she wants to try these changes first and call back when she is ready

## 2023-01-01 DIAGNOSIS — J301 Allergic rhinitis due to pollen: Secondary | ICD-10-CM | POA: Diagnosis not present

## 2023-01-02 DIAGNOSIS — J301 Allergic rhinitis due to pollen: Secondary | ICD-10-CM | POA: Diagnosis not present

## 2023-01-10 DIAGNOSIS — J301 Allergic rhinitis due to pollen: Secondary | ICD-10-CM | POA: Diagnosis not present

## 2023-01-17 DIAGNOSIS — J301 Allergic rhinitis due to pollen: Secondary | ICD-10-CM | POA: Diagnosis not present

## 2023-01-25 ENCOUNTER — Ambulatory Visit (INDEPENDENT_AMBULATORY_CARE_PROVIDER_SITE_OTHER): Payer: Medicare Other

## 2023-01-25 VITALS — BP 130/82 | HR 60 | Temp 98.3°F | Resp 20 | Ht 66.0 in | Wt 255.2 lb

## 2023-01-25 DIAGNOSIS — J4550 Severe persistent asthma, uncomplicated: Secondary | ICD-10-CM

## 2023-01-25 MED ORDER — BENRALIZUMAB 30 MG/ML ~~LOC~~ SOSY
30.0000 mg | PREFILLED_SYRINGE | Freq: Once | SUBCUTANEOUS | Status: AC
  Administered 2023-01-25: 30 mg via SUBCUTANEOUS
  Filled 2023-01-25: qty 1

## 2023-01-25 NOTE — Progress Notes (Signed)
Diagnosis: Asthma  Provider:  Chilton Greathouse MD  Procedure: Injection  Fasenra (Benralizumab), Dose: 30 mg, Site: subcutaneous, Number of injections: 1  Post Care:     Discharge: Condition: Good, Destination: Home . AVS Provided  Performed by:  Garnette Czech, RN

## 2023-01-31 DIAGNOSIS — J301 Allergic rhinitis due to pollen: Secondary | ICD-10-CM | POA: Diagnosis not present

## 2023-02-14 ENCOUNTER — Ambulatory Visit
Admission: RE | Admit: 2023-02-14 | Discharge: 2023-02-14 | Disposition: A | Discharge status: Home / Self Care | Payer: Medicare Other | Source: Ambulatory Visit | Attending: Nurse Practitioner | Admitting: Nurse Practitioner

## 2023-02-14 DIAGNOSIS — Z1231 Encounter for screening mammogram for malignant neoplasm of breast: Secondary | ICD-10-CM | POA: Diagnosis not present

## 2023-02-14 DIAGNOSIS — J301 Allergic rhinitis due to pollen: Secondary | ICD-10-CM | POA: Diagnosis not present

## 2023-02-19 ENCOUNTER — Ambulatory Visit: Payer: Medicare Other | Admitting: Dietician

## 2023-02-21 DIAGNOSIS — J301 Allergic rhinitis due to pollen: Secondary | ICD-10-CM | POA: Diagnosis not present

## 2023-02-28 DIAGNOSIS — J301 Allergic rhinitis due to pollen: Secondary | ICD-10-CM | POA: Diagnosis not present

## 2023-03-14 DIAGNOSIS — J301 Allergic rhinitis due to pollen: Secondary | ICD-10-CM | POA: Diagnosis not present

## 2023-03-15 ENCOUNTER — Encounter: Payer: Self-pay | Admitting: Internal Medicine

## 2023-03-20 DIAGNOSIS — J301 Allergic rhinitis due to pollen: Secondary | ICD-10-CM | POA: Diagnosis not present

## 2023-03-22 ENCOUNTER — Ambulatory Visit (INDEPENDENT_AMBULATORY_CARE_PROVIDER_SITE_OTHER): Payer: Medicare Other

## 2023-03-22 VITALS — BP 149/77 | HR 76 | Temp 98.5°F | Resp 16 | Ht 66.0 in | Wt 254.6 lb

## 2023-03-22 DIAGNOSIS — J4550 Severe persistent asthma, uncomplicated: Secondary | ICD-10-CM

## 2023-03-22 MED ORDER — BENRALIZUMAB 30 MG/ML ~~LOC~~ SOSY
30.0000 mg | PREFILLED_SYRINGE | Freq: Once | SUBCUTANEOUS | Status: AC
  Administered 2023-03-22: 30 mg via SUBCUTANEOUS
  Filled 2023-03-22: qty 1

## 2023-03-22 NOTE — Progress Notes (Signed)
Diagnosis: Asthma  Provider:  Chilton Greathouse MD  Procedure: Injection  Fasenra (Benralizumab), Dose: 30 mg, Site: subcutaneous, Number of injections: 1  Post Care:  n/a  Discharge: Condition: Good, Destination: Home . AVS Provided  Performed by:  Loney Hering, LPN

## 2023-03-28 DIAGNOSIS — J301 Allergic rhinitis due to pollen: Secondary | ICD-10-CM | POA: Diagnosis not present

## 2023-04-04 DIAGNOSIS — J301 Allergic rhinitis due to pollen: Secondary | ICD-10-CM | POA: Diagnosis not present

## 2023-04-11 DIAGNOSIS — J301 Allergic rhinitis due to pollen: Secondary | ICD-10-CM | POA: Diagnosis not present

## 2023-04-18 DIAGNOSIS — J301 Allergic rhinitis due to pollen: Secondary | ICD-10-CM | POA: Diagnosis not present

## 2023-04-30 DIAGNOSIS — J301 Allergic rhinitis due to pollen: Secondary | ICD-10-CM | POA: Diagnosis not present

## 2023-05-09 DIAGNOSIS — J301 Allergic rhinitis due to pollen: Secondary | ICD-10-CM | POA: Diagnosis not present

## 2023-05-10 ENCOUNTER — Encounter: Payer: Self-pay | Admitting: Internal Medicine

## 2023-05-17 ENCOUNTER — Ambulatory Visit: Payer: Medicare Other

## 2023-05-23 ENCOUNTER — Ambulatory Visit (INDEPENDENT_AMBULATORY_CARE_PROVIDER_SITE_OTHER): Payer: Medicare Other

## 2023-05-23 VITALS — BP 171/104 | HR 74 | Temp 98.0°F | Resp 20 | Ht 66.0 in | Wt 256.6 lb

## 2023-05-23 DIAGNOSIS — J4550 Severe persistent asthma, uncomplicated: Secondary | ICD-10-CM

## 2023-05-23 MED ORDER — BENRALIZUMAB 30 MG/ML ~~LOC~~ SOSY
30.0000 mg | PREFILLED_SYRINGE | Freq: Once | SUBCUTANEOUS | Status: AC
  Administered 2023-05-23: 30 mg via SUBCUTANEOUS
  Filled 2023-05-23: qty 1

## 2023-05-23 NOTE — Progress Notes (Signed)
Diagnosis: Asthma  Provider:  Chilton Greathouse MD  Procedure: Injection  Fasenra (Benralizumab), Dose: 30 mg, Site: subcutaneous, Number of injections: 1   Discharge: Condition: Good, Destination: Home . AVS Provided  Performed by:  Nat Math, RN

## 2023-05-24 ENCOUNTER — Encounter (INDEPENDENT_AMBULATORY_CARE_PROVIDER_SITE_OTHER): Payer: Self-pay

## 2023-05-28 ENCOUNTER — Other Ambulatory Visit: Payer: Self-pay | Admitting: Nurse Practitioner

## 2023-05-28 DIAGNOSIS — E785 Hyperlipidemia, unspecified: Secondary | ICD-10-CM

## 2023-05-28 DIAGNOSIS — I10 Essential (primary) hypertension: Secondary | ICD-10-CM

## 2023-05-30 DIAGNOSIS — J301 Allergic rhinitis due to pollen: Secondary | ICD-10-CM | POA: Diagnosis not present

## 2023-06-06 DIAGNOSIS — J301 Allergic rhinitis due to pollen: Secondary | ICD-10-CM | POA: Diagnosis not present

## 2023-06-13 DIAGNOSIS — J301 Allergic rhinitis due to pollen: Secondary | ICD-10-CM | POA: Diagnosis not present

## 2023-06-20 DIAGNOSIS — J301 Allergic rhinitis due to pollen: Secondary | ICD-10-CM | POA: Diagnosis not present

## 2023-06-22 ENCOUNTER — Ambulatory Visit: Payer: Medicare Other | Admitting: Nurse Practitioner

## 2023-06-27 DIAGNOSIS — J301 Allergic rhinitis due to pollen: Secondary | ICD-10-CM | POA: Diagnosis not present

## 2023-06-28 ENCOUNTER — Encounter: Payer: Self-pay | Admitting: Nurse Practitioner

## 2023-06-28 ENCOUNTER — Ambulatory Visit (INDEPENDENT_AMBULATORY_CARE_PROVIDER_SITE_OTHER): Payer: Medicare Other | Admitting: Nurse Practitioner

## 2023-06-28 VITALS — BP 132/70 | HR 72 | Temp 97.0°F | Ht 66.0 in | Wt 257.6 lb

## 2023-06-28 DIAGNOSIS — J4550 Severe persistent asthma, uncomplicated: Secondary | ICD-10-CM | POA: Diagnosis not present

## 2023-06-28 DIAGNOSIS — Z23 Encounter for immunization: Secondary | ICD-10-CM | POA: Diagnosis not present

## 2023-06-28 DIAGNOSIS — E099 Drug or chemical induced diabetes mellitus without complications: Secondary | ICD-10-CM

## 2023-06-28 DIAGNOSIS — I10 Essential (primary) hypertension: Secondary | ICD-10-CM

## 2023-06-28 DIAGNOSIS — E782 Mixed hyperlipidemia: Secondary | ICD-10-CM | POA: Diagnosis not present

## 2023-06-28 DIAGNOSIS — T380X5S Adverse effect of glucocorticoids and synthetic analogues, sequela: Secondary | ICD-10-CM | POA: Diagnosis not present

## 2023-06-28 LAB — RENAL FUNCTION PANEL
Albumin: 3.7 g/dL (ref 3.5–5.2)
BUN: 15 mg/dL (ref 6–23)
CO2: 29 mEq/L (ref 19–32)
Calcium: 9 mg/dL (ref 8.4–10.5)
Chloride: 106 mEq/L (ref 96–112)
Creatinine, Ser: 0.95 mg/dL (ref 0.40–1.20)
GFR: 58.65 mL/min — ABNORMAL LOW (ref >60.00)
Glucose, Bld: 90 mg/dL (ref 70–99)
Phosphorus: 3.4 mg/dL (ref 2.3–4.6)
Potassium: 4 mEq/L (ref 3.5–5.1)
Sodium: 141 mEq/L (ref 135–145)

## 2023-06-28 LAB — HEMOGLOBIN A1C: Hgb A1c MFr Bld: 6.1 % (ref 4.6–6.5)

## 2023-06-28 LAB — LIPID PANEL
Cholesterol: 128 mg/dL (ref 0–200)
HDL: 68.3 mg/dL (ref >39.00)
LDL Cholesterol: 52 mg/dL (ref 0–99)
NonHDL: 60.04
Total CHOL/HDL Ratio: 2
Triglycerides: 40 mg/dL (ref 0.0–149.0)
VLDL: 8 mg/dL (ref 0.0–40.0)

## 2023-06-28 LAB — MICROALBUMIN / CREATININE URINE RATIO
Creatinine,U: 184.8 mg/dL
Microalb Creat Ratio: 0.4 mg/g (ref 0.0–30.0)
Microalb, Ur: <0.7 mg/dL (ref 0.0–1.9)

## 2023-06-28 NOTE — Assessment & Plan Note (Addendum)
Current use of breo inhaler, montelukast, zyrtec, and Fasenra (monoclonal antibody). Under the care of [facility] specialist.

## 2023-06-28 NOTE — Patient Instructions (Addendum)
Go to lab Continue Heart healthy diet and daily exercise. Maintain current medications. Schedule appointment for DIABETES eye exam. Have report faxed to me.  Mediterranean Diet A Mediterranean diet is based on the traditions of countries on the [employer]. It focuses on eating more: Fruits and vegetables. Whole grains, beans, nuts, and seeds. Heart-healthy fats. These are fats that are good for your heart. It involves eating less: Dairy. Meat and eggs. Processed foods with added sugar, salt, and fat. This type of diet can help prevent certain conditions. It can also improve outcomes if you have a long-term (chronic) disease, such as kidney or heart disease. What are tips for following this plan? Reading food labels Check packaged foods for: The serving size. For foods such as rice and pasta, the serving size is the amount of cooked product, not dry. The total fat. Avoid foods with saturated fat or trans fat. Added sugars, such as corn syrup. Shopping  Try to have a balanced diet. Buy a variety of foods, such as: Fresh fruits and vegetables. You may be able to get these from local farmers markets. You can also buy them frozen. Grains, beans, nuts, and seeds. Some of these can be bought in bulk. Fresh seafood. Poultry and eggs. Low-fat dairy products. Buy whole ingredients instead of foods that have already been packaged. If you can't get fresh seafood, buy precooked frozen shrimp or canned fish, such as tuna, salmon, or sardines. Stock your pantry so you always have certain foods on hand, such as olive oil, canned tuna, canned tomatoes, rice, pasta, and beans. Cooking Cook foods with extra-virgin olive oil instead of using butter or other vegetable oils. Have meat as a side dish. Have vegetables or grains as your main dish. This means having meat in small portions or adding small amounts of meat to foods like pasta or stew. Use beans or vegetables instead of meat in common  dishes like chili or lasagna. Try out different cooking methods. Try roasting, broiling, steaming, and sauting vegetables. Add frozen vegetables to soups, stews, pasta, or rice. Add nuts or seeds for added healthy fats and plant protein at each meal. You can add these to yogurt, salads, or vegetable dishes. Marinate fish or vegetables using olive oil, lemon juice, garlic, and fresh herbs. Meal planning Plan to eat a vegetarian meal one day each week. Try to work up to two vegetarian meals, if possible. Eat seafood two or more times a week. Have healthy snacks on hand. These may include: Vegetable sticks with hummus. Greek yogurt. Fruit and nut trail mix. Eat balanced meals. These should include: Fruit: 2-3 servings a day. Vegetables: 4-5 servings a day. Low-fat dairy: 2 servings a day. Fish, poultry, or lean meat: 1 serving a day. Beans and legumes: 2 or more servings a week. Nuts and seeds: 1-2 servings a day. Whole grains: 6-8 servings a day. Extra-virgin olive oil: 3-4 servings a day. Limit red meat and sweets to just a few servings a month. Lifestyle  Try to cook and eat meals with your family. Drink enough fluid to keep your pee (urine) pale yellow. Be active every day. This includes: Aerobic exercise, which is exercise that causes your heart to beat faster. Examples include running and swimming. Leisure activities like gardening, walking, or housework. Get 7-8 hours of sleep each night. Drink red wine if your provider says you can. A glass of wine is 5 oz (150 mL). You may be allowed to have: Up to 1 glass a  day if you're female and not pregnant. Up to 2 glasses a day if you're female. What foods should I eat? Fruits Apples. Apricots. Avocado. Berries. Bananas. Cherries. Dates. Figs. Grapes. Lemons. Melon. Oranges. Peaches. Plums. Pomegranate. Vegetables Artichokes. Beets. Broccoli. Cabbage. Carrots. Eggplant. Green beans. Chard. Kale. Spinach. Onions. Leeks. Peas. Squash.  Tomatoes. Peppers. Radishes. Grains Whole-grain pasta. Brown rice. Bulgur wheat. Polenta. Couscous. Whole-wheat bread. Orpah Cobb. Meats and other proteins Beans. Almonds. Sunflower seeds. Pine nuts. Peanuts. Cod. Salmon. Scallops. Shrimp. Tuna. Tilapia. Clams. Oysters. Eggs. Chicken or [place] without skin. Dairy Low-fat milk. Cheese. Greek yogurt. Fats and oils Extra-virgin olive oil. Avocado oil. Grapeseed oil. Beverages Water. Red wine. Herbal tea. Sweets and desserts Greek yogurt with honey. Baked apples. Poached pears. Trail mix. Seasonings and condiments Basil. Cilantro. Coriander. Cumin. Mint. Parsley. Sage. Rosemary. Tarragon. Garlic. Oregano. Thyme. Pepper. Balsamic vinegar. Tahini. Hummus. Tomato sauce. Olives. Mushrooms. The items listed above may not be all the foods and drinks you can have. Talk to a dietitian to learn more. What foods should I limit? This is a list of foods that should be eaten rarely. Fruits Fruit canned in syrup. Vegetables Deep-fried potatoes, like [place] fries. Grains Packaged pasta or rice dishes. Cereal with added sugar. Snacks with added sugar. Meats and other proteins Beef. Pork. Lamb. Chicken or [place] with skin. Hot dogs. Tomasa Blase. Dairy Ice cream. Sour cream. Whole milk. Fats and oils Butter. Canola oil. Vegetable oil. Beef fat (tallow). Lard. Beverages Juice. Sugar-sweetened soft drinks. Beer. Liquor and spirits. Sweets and desserts Cookies. Cakes. Pies. Candy. Seasonings and condiments Mayonnaise. Pre-made sauces and marinades. The items listed above may not be all the foods and drinks you should limit. Talk to a dietitian to learn more. Where to find more information American Heart Association (AHA): heart.org This information is not intended to replace advice given to you by your health care provider. Make sure you discuss any questions you have with your health care provider. Document Revised: 01/07/2023 Document Reviewed:  01/07/2023 Elsevier Patient Education  2024 [employer].

## 2023-06-28 NOTE — Assessment & Plan Note (Signed)
BP at goal with furosemide daily BP Readings from Last 3 Encounters:  06/28/23 132/70  05/23/23 (!) 171/104  03/22/23 (!) 149/77   Continue med dose Repeat BMP

## 2023-06-28 NOTE — Progress Notes (Signed)
Established Patient Visit  Patient: Rachael Silva   DOB: 15-Mar-1946   77 y.o. Female  MRN: [id number] Visit Date: 06/28/2023  Subjective:    Chief Complaint  Patient presents with   Diabetes, HTN, Hyperlipdemia    Follow up and fasting lab work, flu vaccine   HPI Steroid-induced diabetes mellitus ([facility]) Diet controlled Reminded to schedule Dm eye exam   Hyperlipidemia Repeat lipid panel No adverse effects with crestor 10mg  2x/week and zetia 10mg  Advised to try mediterranean diet  Essential hypertension BP at goal with furosemide daily BP Readings from Last 3 Encounters:  06/28/23 132/70  05/23/23 (!) 171/104  03/22/23 (!) 149/77   Continue med dose Repeat BMP  Severe persistent asthma Current use of breo inhaler, montelukast, zyrtec, and Fasenra (monoclonal antibody). Under the care of [facility] specialist.    Reviewed medical, surgical, and social history today  Medications: Outpatient Medications Prior to Visit  Medication Sig   albuterol (VENTOLIN HFA) 108 (90 Base) MCG/ACT inhaler TAKE 2 PUFFS BY MOUTH EVERY 6 HOURS AS NEEDED FOR WHEEZE OR SHORTNESS OF BREATH   B Complex-C-Folic Acid (STRESS B COMPLEX PO) Take 1 tablet by mouth daily.   BREO ELLIPTA 200-25 MCG/ACT AEPB INHALE 1 PUFF BY MOUTH EVERY DAY   Calcium Carbonate-Vitamin D (CALTRATE 600+D PO) Take 1 tablet by mouth 2 (two) times daily.   cetirizine (ZYRTEC) 10 MG tablet TAKE 1 TABLET BY MOUTH EVERY DAY   co-enzyme Q-10 30 MG capsule Take 200 mg by mouth daily.   EPIPEN 2-PAK 0.3 MG/0.3ML SOAJ injection See admin instructions.   ezetimibe (ZETIA) 10 MG tablet TAKE 1 TABLET BY MOUTH EVERY DAY   furosemide (LASIX) 20 MG tablet TAKE 1/2 TABLET BY MOUTH DAILY   GARLIC PO Take 1 capsule by mouth daily.   montelukast (SINGULAIR) 10 MG tablet Take 1 tablet (10 mg total) by mouth daily.   omeprazole (PRILOSEC) 20 MG capsule Take 1 capsule (20 mg total) by mouth daily.    rosuvastatin (CRESTOR) 10 MG tablet TAKE 1 TABLET BY MOUTH 3 TIMES A WEEK.   [DISCONTINUED] fluticasone furoate-vilanterol (BREO ELLIPTA) 100-25 MCG/ACT AEPB Inhale 1 puff into the lungs daily.   Facility-Administered Medications Prior to Visit  Medication Dose Route Frequency Provider   omalizumab Geoffry Paradise) injection 375 mg  375 mg Subcutaneous Q14 Days Erin Fulling, MD   omalizumab Geoffry Paradise) injection 375 mg  375 mg Subcutaneous Q14 Days Erin Fulling, MD   Reviewed past medical and social history.   ROS per HPI above      Objective:  BP 132/70 (BP Location: Right Arm)   Pulse 72   Temp (!) 97 F (36.1 C)   Ht 5\' 6"  (1.676 m)   Wt 257 lb 9.6 oz (116.8 kg)   SpO2 97%   BMI 41.58 kg/m      Physical Exam Cardiovascular:     Rate and Rhythm: Normal rate and regular rhythm.     Pulses: Normal pulses.     Heart sounds: Normal heart sounds.  Pulmonary:     Effort: Pulmonary effort is normal.     Breath sounds: Normal breath sounds.  Neurological:     Mental Status: She is alert and oriented to person, place, and time.     No results found for any visits on 06/28/23.    Assessment & Plan:    Problem List Items Addressed This Visit  Essential hypertension    BP at goal with furosemide daily BP Readings from Last 3 Encounters:  06/28/23 132/70  05/23/23 (!) 171/104  03/22/23 (!) 149/77   Continue med dose Repeat BMP      Relevant Orders   Renal Function Panel   Hyperlipidemia - Primary    Repeat lipid panel No adverse effects with crestor 10mg  2x/week and zetia 10mg  Advised to try mediterranean diet      Relevant Orders   Lipid panel   Severe persistent asthma    Current use of breo inhaler, montelukast, zyrtec, and Fasenra (monoclonal antibody). Under the care of [facility] specialist.      Steroid-induced diabetes mellitus ([facility])    Diet controlled Reminded to schedule Dm eye exam       Relevant Orders   Hemoglobin A1c   Renal  Function Panel   Microalbumin / creatinine urine ratio   Other Visit Diagnoses     Immunization due       Relevant Orders   Flu Vaccine Trivalent High Dose (Fluad) (Completed)      Return in about 6 months (around 12/26/2023) for DM, HTN, hyperlipidemia (fasting).     Alysia Penna, NP

## 2023-06-28 NOTE — Assessment & Plan Note (Addendum)
Repeat lipid panel No adverse effects with crestor 10mg  2x/week and zetia 10mg  Advised to try mediterranean diet

## 2023-06-28 NOTE — Assessment & Plan Note (Addendum)
Diet controlled Reminded to schedule Dm eye exam

## 2023-07-04 DIAGNOSIS — J301 Allergic rhinitis due to pollen: Secondary | ICD-10-CM | POA: Diagnosis not present

## 2023-07-11 DIAGNOSIS — J301 Allergic rhinitis due to pollen: Secondary | ICD-10-CM | POA: Diagnosis not present

## 2023-07-18 ENCOUNTER — Ambulatory Visit: Payer: Medicare Other

## 2023-07-18 VITALS — BP 186/102 | HR 80 | Temp 97.5°F | Resp 20 | Ht 66.0 in | Wt 255.0 lb

## 2023-07-18 DIAGNOSIS — J4550 Severe persistent asthma, uncomplicated: Secondary | ICD-10-CM | POA: Diagnosis not present

## 2023-07-18 MED ORDER — BENRALIZUMAB 30 MG/ML ~~LOC~~ SOSY
30.0000 mg | PREFILLED_SYRINGE | Freq: Once | SUBCUTANEOUS | Status: AC
  Administered 2023-07-18: 30 mg via SUBCUTANEOUS
  Filled 2023-07-18: qty 1

## 2023-07-18 NOTE — Progress Notes (Signed)
Diagnosis: Asthma  Provider:  Chilton Greathouse MD  Procedure: Injection  Fasenra (Benralizumab), Dose: 30 mg, Site: subcutaneous, Number of injections: 1   Discharge: Condition: Good, Destination: Home . AVS Declined  Performed by:  Nat Math, RN

## 2023-07-19 ENCOUNTER — Ambulatory Visit: Payer: Medicare Other

## 2023-07-19 ENCOUNTER — Encounter: Payer: Self-pay | Admitting: Adult Health

## 2023-07-19 ENCOUNTER — Telehealth: Payer: Medicare Other | Admitting: Adult Health

## 2023-07-19 DIAGNOSIS — J4520 Mild intermittent asthma, uncomplicated: Secondary | ICD-10-CM | POA: Diagnosis not present

## 2023-07-19 MED ORDER — MONTELUKAST SODIUM 10 MG PO TABS: 10.0000 mg | ORAL_TABLET | Freq: Every day | ORAL | 2 refills | Status: DC

## 2023-07-19 MED ORDER — FLUTICASONE FUROATE-VILANTEROL 200-25 MCG/ACT IN AEPB: 1.0000 | INHALATION_SPRAY | Freq: Every day | RESPIRATORY_TRACT | 5 refills | Status: DC

## 2023-07-19 MED ORDER — ALBUTEROL SULFATE HFA 108 (90 BASE) MCG/ACT IN AERS: 1.0000 | INHALATION_SPRAY | Freq: Four times a day (QID) | RESPIRATORY_TRACT | 3 refills | Status: AC | PRN

## 2023-07-19 NOTE — Progress Notes (Signed)
Virtual Visit via Video Note  I connected with Rachael Silva on 07/19/23 at  3:00 PM EDT by a video enabled telemedicine application and verified that I am speaking with the correct person using two identifiers.  Location: Patient: Home  Provider: Office    I discussed the limitations of evaluation and management by telemedicine and the availability of in person appointments. The patient expressed understanding and agreed to proceed.  History of Present Illness: 77 year old female never smoker followed for asthma with an allergic phenotype, history of chronic eosinophilic pneumonia (previous transbronchial biopsy positive for eosinophilic infiltrate), history of ABPA previously treated with chronic steroids and Fasenra, And OSA  Today's video visit is a follow-up for allergic asthma and sleep apnea Remains on Breo daily, Singulair, Zyrtec. Leanora Ivanoff Flonase As needed   Remains on Fasenra every 8 weeks. Says overall breathing is doing okay.  Does feel breathing was better on BREO 200 , more symptoms since going down on dosing with BREO 100.  Uses albuterol 1-2 times a week.   Past Medical History:  Diagnosis Date   Abnormal mammogram with microcalcification 06/06/2011   Allergy    allergic rhinitis   Angioedema 02/2006   Arthritis    OA of knees   Asthma    Cellulitis of finger of right hand 02/14/2018   Complication of anesthesia    apnea- sleep   Dizziness 02/26/2017   Elevated liver enzymes 03/20/2018   Frequent headaches    Glucocorticoids and synthetic analogues causing adverse effect in therapeutic use 08/27/2014   Hiatal hernia    HOT FLASHES 02/18/2008   Qualifier: Diagnosis of   By: Milinda Antis MD, Colon Flattery      Hypertension    Hypothyroid    Labile blood pressure    Long term current use of systemic steroids 08/27/2014   Lower extremity edema    Lung disease    Night sweats 12/01/2016   Pneumonia    Pneumonia, eosinophilic (HCC)    Productive cough 05/27/2014    Shortness of breath dyspnea    Sleep apnea    Swelling of right hand 02/14/2018   Syncopal episodes 05/11/2016   Urinary incontinence    Weight gain due to medication 02/18/2008   Patient with diagnoses of underlying obesity.  Started on prednisone (high dose 60 mg, with tapering dose) on September 30 for suspected hypersensitivity pneumonitis versus ABPA eosinophilic pneumonia      Wheezing     Observations/Objective: 07/19/2023 Appears in NAD   IGE 06/2014 2401   Assessment and Plan: Moderate Persistent Asthma with allergic phenotype- slight increase in symptom burden on lower dose Breo. Recommend increasing BREO 200.  Continue on Singulair, Zyrtec and Fasenra. Flonase As needed    History of ABPA and Eosinophilic Pneumonia ~2015 . Treated with steroids -clinically improved . No indication for ongoing steroid use.  Continue on Fasenra . Chest xray today .   OSA -excellent control on CPAP   Plan  Patient Instructions  Increase BREO 200 1 puff daily, rinse after use.  Continue on Singulair and Zyrtec daily  Flonase nasal As needed   Albuterol inhaler As needed   Chest xray  Activity as tolerated.  Continue on CPAP At bedtime .  Work on healthy weight loss.  Follow up with Dr. Belia Heman in 6 months and As needed   Please contact office for sooner follow up if symptoms do not improve or worsen or seek emergency care      Follow Up Instructions:  I discussed the assessment and treatment plan with the patient. The patient was provided an opportunity to ask questions and all were answered. The patient agreed with the plan and demonstrated an understanding of the instructions.   The patient was advised to call back or seek an in-person evaluation if the symptoms worsen or if the condition fails to improve as anticipated.  I provided 30  minutes of non-face-to-face time during this encounter.   Rubye Oaks, NP

## 2023-07-19 NOTE — Patient Instructions (Addendum)
Increase BREO 200 1 puff daily, rinse after use.  Continue on Singulair and Zyrtec daily  Flonase nasal As needed   Albuterol inhaler As needed   Chest xray  Activity as tolerated.  Continue on CPAP At bedtime .  Work on healthy weight loss.  Follow up with Dr. Belia Heman in 6 months and As needed   Please contact office for sooner follow up if symptoms do not improve or worsen or seek emergency care

## 2023-08-01 DIAGNOSIS — J301 Allergic rhinitis due to pollen: Secondary | ICD-10-CM | POA: Diagnosis not present

## 2023-08-13 DIAGNOSIS — J301 Allergic rhinitis due to pollen: Secondary | ICD-10-CM | POA: Diagnosis not present

## 2023-08-20 DIAGNOSIS — J301 Allergic rhinitis due to pollen: Secondary | ICD-10-CM | POA: Diagnosis not present

## 2023-08-22 DIAGNOSIS — J301 Allergic rhinitis due to pollen: Secondary | ICD-10-CM | POA: Diagnosis not present

## 2023-09-03 DIAGNOSIS — J309 Allergic rhinitis, unspecified: Secondary | ICD-10-CM | POA: Diagnosis not present

## 2023-09-03 DIAGNOSIS — J45991 Cough variant asthma: Secondary | ICD-10-CM | POA: Diagnosis not present

## 2023-09-03 DIAGNOSIS — J209 Acute bronchitis, unspecified: Secondary | ICD-10-CM | POA: Diagnosis not present

## 2023-09-12 ENCOUNTER — Other Ambulatory Visit: Payer: Self-pay

## 2023-09-12 ENCOUNTER — Telehealth: Payer: Self-pay

## 2023-09-12 NOTE — Telephone Encounter (Signed)
Patient was added for tomorrow at 1:40 pm

## 2023-09-12 NOTE — Telephone Encounter (Signed)
Spoke to patient and she having URI symptoms with chest congestion. Patient was seen on 09/03/2023 with Dr. Stephani Police ENT and was told she have wheezing symptoms on the right side of lungs. Patient was prescribed antibiotics and would like chest X ray done to rule of pneumonia. PT stated HX of frequent  URI and bronchitis.

## 2023-09-13 ENCOUNTER — Encounter: Payer: Self-pay | Admitting: Nurse Practitioner

## 2023-09-13 ENCOUNTER — Ambulatory Visit: Payer: Medicare Other | Admitting: Nurse Practitioner

## 2023-09-13 ENCOUNTER — Ambulatory Visit (INDEPENDENT_AMBULATORY_CARE_PROVIDER_SITE_OTHER)
Admission: RE | Admit: 2023-09-13 | Discharge: 2023-09-13 | Disposition: A | Discharge status: Home / Self Care | Payer: Medicare Other | Source: Ambulatory Visit | Attending: Nurse Practitioner | Admitting: Nurse Practitioner

## 2023-09-13 ENCOUNTER — Ambulatory Visit: Payer: Medicare Other

## 2023-09-13 VITALS — BP 169/82 | HR 75 | Temp 98.1°F | Resp 20 | Ht 66.0 in | Wt 257.8 lb

## 2023-09-13 VITALS — BP 137/78 | HR 68 | Temp 98.2°F | Resp 20 | Wt 261.8 lb

## 2023-09-13 DIAGNOSIS — R5383 Other fatigue: Secondary | ICD-10-CM

## 2023-09-13 DIAGNOSIS — R5381 Other malaise: Secondary | ICD-10-CM

## 2023-09-13 DIAGNOSIS — R051 Acute cough: Secondary | ICD-10-CM | POA: Diagnosis not present

## 2023-09-13 DIAGNOSIS — J4550 Severe persistent asthma, uncomplicated: Secondary | ICD-10-CM | POA: Diagnosis not present

## 2023-09-13 DIAGNOSIS — R058 Other specified cough: Secondary | ICD-10-CM | POA: Diagnosis not present

## 2023-09-13 LAB — POCT URINALYSIS DIPSTICK
Bilirubin, UA: NEGATIVE
Blood, UA: 7
Clarity, UA: NORMAL
Glucose, UA: NEGATIVE
Ketones, UA: NEGATIVE
Leukocytes, UA: NEGATIVE
Nitrite, UA: NEGATIVE
Odor: NORMAL
Protein, UA: NEGATIVE
Spec Grav, UA: 1.01 (ref 1.010–1.025)
Urobilinogen, UA: 0.2 U/dL
pH, UA: 7 (ref 5.0–8.0)

## 2023-09-13 LAB — COMPREHENSIVE METABOLIC PANEL
ALT: 13 U/L (ref 0–35)
AST: 14 U/L (ref 0–37)
Albumin: 4 g/dL (ref 3.5–5.2)
Alkaline Phosphatase: 66 U/L (ref 39–117)
BUN: 13 mg/dL (ref 6–23)
CO2: 31 meq/L (ref 19–32)
Calcium: 9.5 mg/dL (ref 8.4–10.5)
Chloride: 102 meq/L (ref 96–112)
Creatinine, Ser: 1.1 mg/dL (ref 0.40–1.20)
GFR: 49.12 mL/min — ABNORMAL LOW (ref >60.00)
Glucose, Bld: 87 mg/dL (ref 70–99)
Potassium: 3.5 meq/L (ref 3.5–5.1)
Sodium: 139 meq/L (ref 135–145)
Total Bilirubin: 0.7 mg/dL (ref 0.2–1.2)
Total Protein: 7.5 g/dL (ref 6.0–8.3)

## 2023-09-13 LAB — CBC WITH DIFFERENTIAL/PLATELET
Basophils Absolute: 0 10*3/uL (ref 0.0–0.1)
Basophils Relative: 0.2 % (ref 0.0–3.0)
Eosinophils Absolute: 0 10*3/uL (ref 0.0–0.7)
Eosinophils Relative: 0 % (ref 0.0–5.0)
HCT: 44 % (ref 36.0–46.0)
Hemoglobin: 15 g/dL (ref 12.0–15.0)
Lymphocytes Relative: 48.3 % — ABNORMAL HIGH (ref 12.0–46.0)
Lymphs Abs: 3.7 10*3/uL (ref 0.7–4.0)
MCHC: 34.1 g/dL (ref 30.0–36.0)
MCV: 88 fL (ref 78.0–100.0)
Monocytes Absolute: 0.5 10*3/uL (ref 0.1–1.0)
Monocytes Relative: 6.6 % (ref 3.0–12.0)
Neutro Abs: 3.4 10*3/uL (ref 1.4–7.7)
Neutrophils Relative %: 44.9 % (ref 43.0–77.0)
Platelets: 198 10*3/uL (ref 150.0–400.0)
RBC: 5 Mil/uL (ref 3.87–5.11)
RDW: 14 % (ref 11.5–15.5)
WBC: 7.6 10*3/uL (ref 4.0–10.5)

## 2023-09-13 MED ORDER — BENRALIZUMAB 30 MG/ML ~~LOC~~ SOSY
30.0000 mg | PREFILLED_SYRINGE | Freq: Once | SUBCUTANEOUS | Status: AC
  Administered 2023-09-13: 30 mg via SUBCUTANEOUS
  Filled 2023-09-13: qty 1

## 2023-09-13 NOTE — Progress Notes (Signed)
Acute Office Visit  Subjective:    Patient ID: Rachael Silva, female    DOB: 07/20/1946, 77 y.o.   MRN: [id number]  Chief Complaint  Patient presents with   Acute Visit    PT C/O of URI symptoms since October. PT was prescribed antibiotics by ENT due to wheezing but voiced symptoms are still there. PT would like CT X-RAY to rule out pneumonia. [facility] exam scheduled for tomorrow at [facility])    Cough This is a new problem. The current episode started 1 to 4 weeks ago. The problem has been unchanged. The problem occurs constantly. The cough is Non-productive. Associated symptoms include chest pain, myalgias, nasal congestion, postnasal drip and shortness of breath. Pertinent negatives include no chills, ear congestion, ear pain, fever, headaches, heartburn, hemoptysis, rash, rhinorrhea, sore throat, sweats, weight loss or wheezing. Nothing aggravates the symptoms. She has tried a beta-agonist inhaler, steroid inhaler and oral steroids (levaquin 750mg  x 14days) for the symptoms. Her past medical history is significant for asthma, bronchitis and environmental allergies. There is no history of bronchiectasis, COPD, emphysema or pneumonia.  Persistent malaise and fatigue despite completion of levaquin and oral prednisone x 10days  Outpatient Medications Prior to Visit  Medication Sig   albuterol (VENTOLIN HFA) 108 (90 Base) MCG/ACT inhaler Inhale 1-2 puffs into the lungs every 6 (six) hours as needed for wheezing or shortness of breath.   amoxicillin (AMOXIL) 875 MG tablet    B Complex-C-Folic Acid (STRESS B COMPLEX PO) Take 1 tablet by mouth daily.   Calcium Carbonate-Vitamin D (CALTRATE 600+D PO) Take 1 tablet by mouth 2 (two) times daily.   cetirizine (ZYRTEC) 10 MG tablet TAKE 1 TABLET BY MOUTH EVERY DAY   co-enzyme Q-10 30 MG capsule Take 200 mg by mouth daily.   EPIPEN 2-PAK 0.3 MG/0.3ML SOAJ injection See admin instructions.   ezetimibe (ZETIA) 10 MG tablet TAKE 1 TABLET BY MOUTH  EVERY DAY   fluticasone furoate-vilanterol (BREO ELLIPTA) 200-25 MCG/ACT AEPB Inhale 1 puff into the lungs daily.   furosemide (LASIX) 20 MG tablet TAKE 1/2 TABLET BY MOUTH DAILY   GARLIC PO Take 1 capsule by mouth daily.   levofloxacin (LEVAQUIN) 750 MG tablet Take 750 mg by mouth daily.   montelukast (SINGULAIR) 10 MG tablet Take 1 tablet (10 mg total) by mouth daily.   omeprazole (PRILOSEC) 20 MG capsule Take 1 capsule (20 mg total) by mouth daily.   predniSONE (DELTASONE) 10 MG tablet Take by mouth as directed.   rosuvastatin (CRESTOR) 10 MG tablet TAKE 1 TABLET BY MOUTH 3 TIMES A WEEK.   Facility-Administered Medications Prior to Visit  Medication Dose Route Frequency Provider   benralizumab [facility]) prefilled syringe 30 mg  30 mg Subcutaneous Once Erin Fulling, MD    Reviewed past medical and social history.  Review of Systems  Constitutional:  Negative for chills, fever and weight loss.  HENT:  Positive for postnasal drip. Negative for ear pain, rhinorrhea and sore throat.   Respiratory:  Positive for cough and shortness of breath. Negative for hemoptysis and wheezing.   Cardiovascular:  Positive for chest pain.  Gastrointestinal:  Negative for heartburn.  Musculoskeletal:  Positive for myalgias.  Skin:  Negative for rash.  Allergic/Immunologic: Positive for environmental allergies.  Neurological:  Negative for headaches.   Per HPI     Objective:    Physical Exam Vitals and nursing note reviewed.  Constitutional:      General: She is not in acute distress.  Cardiovascular:     Rate and Rhythm: Normal rate and regular rhythm.     Pulses: Normal pulses.     Heart sounds: Normal heart sounds.  Pulmonary:     Effort: Pulmonary effort is normal.     Breath sounds: Normal breath sounds.  Neurological:     Mental Status: She is alert and oriented to person, place, and time.    BP 137/78 (BP Location: Left Arm, Patient Position: Sitting, Cuff Size: Large)   Pulse 68    Temp 98.2 F (36.8 C) (Temporal)   Resp 20   Wt 261 lb 12.8 oz (118.8 kg)   SpO2 99%   BMI 42.26 kg/m    Results for orders placed or performed in visit on 09/13/23  POCT urinalysis dipstick  Result Value Ref Range   Color, UA clear    Clarity, UA normal    Glucose, UA Negative Negative   Bilirubin, UA negative    Ketones, UA negative    Spec Grav, UA 1.010 1.010 - 1.025   Blood, UA 7.0    pH, UA 7.0 5.0 - 8.0   Protein, UA Negative Negative   Urobilinogen, UA 0.2 0.2 or 1.0 E.U./dL   Nitrite, UA negative    Leukocytes, UA Negative Negative   Appearance clear    Odor normal       Assessment & Plan:   Problem List Items Addressed This Visit   None Visit Diagnoses     Acute cough    -  Primary   Relevant Orders   CBC with Differential/Platelet   Comprehensive metabolic panel   DG Chest 2 View   POCT urinalysis dipstick (Completed)   Malaise and fatigue       Relevant Orders   CBC with Differential/Platelet   Comprehensive metabolic panel   DG Chest 2 View   POCT urinalysis dipstick (Completed)     Normal CXR and other lab results. No additional treatment needed at this time.  No orders of the defined types were placed in this encounter.  Return if symptoms worsen or fail to improve.    Alysia Penna, NP

## 2023-09-13 NOTE — Progress Notes (Signed)
Diagnosis: Asthma  Provider:  Chilton Greathouse MD  Procedure: Injection  Fasenra (Benralizumab), Dose: 30 mg, Site: subcutaneous, Number of injections: 1  Post Care:  Right arm injection  Discharge: Condition: Good, Destination: Home . AVS Declined  Performed by:  Rico Ala, LPN

## 2023-09-13 NOTE — Patient Instructions (Signed)
Go to lab Go to [address] for CXR   

## 2023-09-14 DIAGNOSIS — H5203 Hypermetropia, bilateral: Secondary | ICD-10-CM | POA: Diagnosis not present

## 2023-09-14 DIAGNOSIS — H18513 Endothelial corneal dystrophy, bilateral: Secondary | ICD-10-CM | POA: Diagnosis not present

## 2023-09-14 DIAGNOSIS — H52223 Regular astigmatism, bilateral: Secondary | ICD-10-CM | POA: Diagnosis not present

## 2023-09-14 DIAGNOSIS — H35372 Puckering of macula, left eye: Secondary | ICD-10-CM | POA: Diagnosis not present

## 2023-09-14 DIAGNOSIS — H2513 Age-related nuclear cataract, bilateral: Secondary | ICD-10-CM | POA: Diagnosis not present

## 2023-09-14 DIAGNOSIS — H53143 Visual discomfort, bilateral: Secondary | ICD-10-CM | POA: Diagnosis not present

## 2023-09-14 DIAGNOSIS — H524 Presbyopia: Secondary | ICD-10-CM | POA: Diagnosis not present

## 2023-09-20 DIAGNOSIS — J301 Allergic rhinitis due to pollen: Secondary | ICD-10-CM | POA: Diagnosis not present

## 2023-09-26 DIAGNOSIS — J301 Allergic rhinitis due to pollen: Secondary | ICD-10-CM | POA: Diagnosis not present

## 2023-10-17 DIAGNOSIS — J301 Allergic rhinitis due to pollen: Secondary | ICD-10-CM | POA: Diagnosis not present

## 2023-10-31 DIAGNOSIS — J301 Allergic rhinitis due to pollen: Secondary | ICD-10-CM | POA: Diagnosis not present

## 2023-11-06 DIAGNOSIS — J301 Allergic rhinitis due to pollen: Secondary | ICD-10-CM | POA: Diagnosis not present

## 2023-11-08 ENCOUNTER — Ambulatory Visit: Payer: Medicare Other

## 2023-11-08 VITALS — BP 167/100 | HR 84 | Temp 97.3°F | Resp 22 | Ht 66.0 in | Wt 258.2 lb

## 2023-11-08 DIAGNOSIS — J4550 Severe persistent asthma, uncomplicated: Secondary | ICD-10-CM | POA: Diagnosis not present

## 2023-11-08 MED ORDER — BENRALIZUMAB 30 MG/ML ~~LOC~~ SOSY
30.0000 mg | PREFILLED_SYRINGE | Freq: Once | SUBCUTANEOUS | Status: AC
  Administered 2023-11-08: 30 mg via SUBCUTANEOUS

## 2023-11-08 NOTE — Progress Notes (Signed)
Diagnosis: Asthma  Provider:  Chilton Greathouse MD  Procedure: Injection  Fasenra (Benralizumab), Dose: 30 mg, Site: subcutaneous, Number of injections: 1  Injection Site(s): Right arm  Post Care: Patient declined observation  Discharge: Condition: Good, Destination: Home . AVS Declined  Performed by:  Marlow Baars [place], RN

## 2023-11-14 DIAGNOSIS — J301 Allergic rhinitis due to pollen: Secondary | ICD-10-CM | POA: Diagnosis not present

## 2023-11-16 ENCOUNTER — Other Ambulatory Visit: Payer: Self-pay | Admitting: Nurse Practitioner

## 2023-11-16 DIAGNOSIS — E1169 Type 2 diabetes mellitus with other specified complication: Secondary | ICD-10-CM

## 2023-11-22 ENCOUNTER — Other Ambulatory Visit: Payer: Self-pay | Admitting: Nurse Practitioner

## 2023-11-22 DIAGNOSIS — E782 Mixed hyperlipidemia: Secondary | ICD-10-CM

## 2023-11-23 NOTE — Telephone Encounter (Signed)
Requesting: EZETIMIBE 10 MG TABLET  Last Visit: 09/13/2023 Next Visit: 12/26/2023 Last Refill: 09/08/2022  Please Advise

## 2023-11-26 ENCOUNTER — Ambulatory Visit (INDEPENDENT_AMBULATORY_CARE_PROVIDER_SITE_OTHER): Payer: Medicare Other

## 2023-11-26 DIAGNOSIS — Z0000 Encounter for general adult medical examination without abnormal findings: Secondary | ICD-10-CM

## 2023-11-26 NOTE — Patient Instructions (Signed)
[name] , Thank you for taking time to come for your Medicare Wellness Visit. I appreciate your ongoing commitment to your health goals. Please review the following plan we discussed and let me know if I can assist you in the future.   Referrals/Orders/Follow-Ups/Clinician Recommendations: none  This is a list of the screening recommended for you and due dates:  Health Maintenance  Topic Date Due   Eye exam for diabetics  01/26/2021   COVID-19 Vaccine (3 - 2024-25 season) 01/07/2024*   Complete foot exam   12/20/2023   Hemoglobin A1C  12/26/2023   Yearly kidney health urinalysis for diabetes  06/27/2024   Yearly kidney function blood test for diabetes  09/12/2024   Medicare Annual Wellness Visit  11/25/2024   Colon Cancer Screening  12/15/2031   Pneumonia Vaccine  Completed   Flu Shot  Completed   DEXA scan (bone density measurement)  Completed   Hepatitis C Screening  Completed   Zoster (Shingles) Vaccine  Completed   HPV Vaccine  Aged Out   DTaP/Tdap/Td vaccine  Discontinued  *Topic was postponed. The date shown is not the original due date.    Advanced directives: (Declined) Advance directive discussed with you today. Even though you declined this today, please call our office should you change your mind, and we can give you the proper paperwork for you to fill out.  Next Medicare Annual Wellness Visit scheduled for next year: Yes  insert Preventive Care attachment Insert FALL PREVENTION attachment if needed

## 2023-11-26 NOTE — Progress Notes (Signed)
Subjective:   Rachael Silva is a 78 y.o. female who presents for Medicare Annual (Subsequent) preventive examination.  Visit Complete: Virtual I connected with  Rachael Silva on 11/26/23 by a audio enabled telemedicine application and verified that I am speaking with the correct person using two identifiers. Interactive audio and video telecommunications were attempted between this provider and patient, however failed, due to patient having technical difficulties OR patient did not have access to video capability.  We continued and completed visit with audio only.  Patient Location: Home  Provider Location: Office/Clinic  I discussed the limitations of evaluation and management by telemedicine. The patient expressed understanding and agreed to proceed.  Vital Signs: Because this visit was a virtual/telehealth visit, some criteria may be missing or patient reported. Any vitals not documented were not able to be obtained and vitals that have been documented are patient reported.  Patient Medicare AWV questionnaire was completed by the patient on 11/26/2023; I have confirmed that all information answered by patient is correct and no changes since this date.  Cardiac Risk Factors include: advanced age (>51men, >68 women);dyslipidemia;hypertension     Objective:    Today's Vitals   There is no height or weight on file to calculate BMI.     11/26/2023    1:33 PM 11/23/2021    3:22 PM 08/19/2020   11:19 AM 08/06/2019    2:37 PM 07/01/2018    2:54 PM 02/25/2018    9:01 AM 02/15/2018   12:00 AM  Advanced Directives  Does Patient Have a Medical Advance Directive? No No No No No No No  Would patient like information on creating a medical advance directive? No - Patient declined  No - Patient declined No - Patient declined No - Patient declined No - Patient declined No - Patient declined    Current Medications (verified) Outpatient Encounter Medications as of 11/26/2023  Medication Sig    albuterol (VENTOLIN HFA) 108 (90 Base) MCG/ACT inhaler Inhale 1-2 puffs into the lungs every 6 (six) hours as needed for wheezing or shortness of breath.   B Complex-C-Folic Acid (STRESS B COMPLEX PO) Take 1 tablet by mouth daily.   Calcium Carbonate-Vitamin D (CALTRATE 600+D PO) Take 1 tablet by mouth 2 (two) times daily.   cetirizine (ZYRTEC) 10 MG tablet TAKE 1 TABLET BY MOUTH EVERY DAY   co-enzyme Q-10 30 MG capsule Take 200 mg by mouth daily.   EPIPEN 2-PAK 0.3 MG/0.3ML SOAJ injection See admin instructions.   ezetimibe (ZETIA) 10 MG tablet TAKE 1 TABLET BY MOUTH EVERY DAY   fluticasone furoate-vilanterol (BREO ELLIPTA) 200-25 MCG/ACT AEPB Inhale 1 puff into the lungs daily.   furosemide (LASIX) 20 MG tablet TAKE 1/2 TABLET BY MOUTH DAILY   GARLIC PO Take 1 capsule by mouth daily.   montelukast (SINGULAIR) 10 MG tablet Take 1 tablet (10 mg total) by mouth daily.   omeprazole (PRILOSEC) 20 MG capsule Take 1 capsule (20 mg total) by mouth daily.   rosuvastatin (CRESTOR) 10 MG tablet TAKE 1 TABLET BY MOUTH 3 TIMES A WEEK.   amoxicillin (AMOXIL) 875 MG tablet  (Patient not taking: Reported on 11/26/2023)   levofloxacin (LEVAQUIN) 750 MG tablet Take 750 mg by mouth daily. (Patient not taking: Reported on 11/26/2023)   predniSONE (DELTASONE) 10 MG tablet Take by mouth as directed. (Patient not taking: Reported on 11/26/2023)   No facility-administered encounter medications on file as of 11/26/2023.    Allergies (verified) Lisinopril, Atorvastatin, and Erythromycin  History: Past Medical History:  Diagnosis Date   Abnormal mammogram with microcalcification 06/06/2011   Allergy    allergic rhinitis   Angioedema 02/2006   Arthritis    OA of knees   Asthma    Cellulitis of finger of right hand 02/14/2018   Complication of anesthesia    apnea- sleep   Dizziness 02/26/2017   Elevated liver enzymes 03/20/2018   Frequent headaches    Glucocorticoids and synthetic analogues causing adverse  effect in therapeutic use 08/27/2014   Hiatal hernia    HOT FLASHES 02/18/2008   Qualifier: Diagnosis of   By: Milinda Antis MD, Colon Flattery      Hypertension    Hypothyroid    Labile blood pressure    Long term current use of systemic steroids 08/27/2014   Lower extremity edema    Lung disease    Night sweats 12/01/2016   Pneumonia    Pneumonia, eosinophilic (HCC)    Productive cough 05/27/2014   Shortness of breath dyspnea    Sleep apnea    Swelling of right hand 02/14/2018   Syncopal episodes 05/11/2016   Urinary incontinence    Weight gain due to medication 02/18/2008   Patient with diagnoses of underlying obesity.  Started on prednisone (high dose 60 mg, with tapering dose) on September 30 for suspected hypersensitivity pneumonitis versus ABPA eosinophilic pneumonia      Wheezing    Past Surgical History:  Procedure Laterality Date   BREAST BIOPSY Left 2013   NEG   BREAST SURGERY  1966   breast biopsy   CESAREAN SECTION     x2   FLEXIBLE BRONCHOSCOPY N/A 09/16/2015   Procedure: FLEXIBLE BRONCHOSCOPY WITH FLURO ;  Surgeon: Stephanie Acre, MD;  Location: [facility] ORS;  Service: Pulmonary;  Laterality: N/A;   Family History  Problem Relation Age of Onset   Diabetes Mother        pre-diabetic   Heart disease Mother        ? heart disease   Emphysema Mother    Thyroid disease Mother    Macular degeneration Mother    Cancer Father        prostate cancer   Dementia Father    Spina bifida Daughter    Miscarriages / [place] Son    Diabetes Maternal Uncle    Kidney disease Half-Brother    Kidney disease Half-Sister    Spina bifida Half-Sister    Hyperlipidemia Half-Sister    Breast cancer Neg Hx    Social History   Socioeconomic History   Marital status: Single    Spouse name: Not on file   Number of children: 2   Years of education: college   Highest education level: Bachelor's degree (e.g., BA, AB, BS)  Occupational History   Occupation: retired    Associate Professor: RETIRED   Tobacco Use   Smoking status: Never   Smokeless tobacco: Never  Vaping Use   Vaping status: Never Used  Substance and Sexual Activity   Alcohol use: No    Alcohol/week: 0.0 standard drinks of alcohol   Drug use: No   Sexual activity: Never  Other Topics Concern   Not on file  Social History Narrative   Lives alone   Left handed    Caffeine use: 1 cup per day   Social Drivers of Health   Financial Resource Strain: Low Risk  (11/26/2023)   Overall Financial Resource Strain (CARDIA)    Difficulty of Paying Living Expenses: Not hard at all  Food Insecurity: No Food Insecurity (11/26/2023)   Hunger Vital Sign    Worried About Running Out of Food in the Last Year: Never true    Ran Out of Food in the Last Year: Never true  Transportation Needs: No Transportation Needs (11/26/2023)   PRAPARE - Administrator, Civil Service (Medical): No    Lack of Transportation (Non-Medical): No  Physical Activity: Insufficiently Active (11/26/2023)   Exercise Vital Sign    Days of Exercise per Week: 1 day    Minutes of Exercise per Session: 10 min  Stress: No Stress Concern Present (11/26/2023)   [employer] of Occupational Health - Occupational Stress Questionnaire    Feeling of Stress : Not at all  Social Connections: Socially Isolated (11/26/2023)   Social Connection and Isolation Panel [NHANES]    Frequency of Communication with Friends and Family: More than three times a week    Frequency of Social Gatherings with Friends and Family: Once a week    Attends Religious Services: Never    Database administrator or Organizations: No    Attends Engineer, structural: Never    Marital Status: Divorced    Tobacco Counseling Counseling given: Not Answered   Clinical Intake:  Pre-visit preparation completed: Yes  Pain : No/denies pain     Nutritional Risks: None Diabetes: No  How often do you need to have someone help you when you read instructions, pamphlets,  or other written materials from your doctor or pharmacy?: 1 - Never  Interpreter Needed?: No  Information entered by :: NAllen LPN   Activities of Daily Living    11/26/2023    2:33 AM  In your present state of health, do you have any difficulty performing the following activities:  Hearing? 0  Vision? 0  Difficulty concentrating or making decisions? 0  Walking or climbing stairs? 0  Dressing or bathing? 0  Doing errands, shopping? 0  Preparing Food and eating ? N  Using the Toilet? N  In the past six months, have you accidently leaked urine? N  Do you have problems with loss of bowel control? N  Managing your Medications? N  Managing your Finances? N  Housekeeping or managing your Housekeeping? N    Patient Care Team: Nche, Bonna Gains, NP as PCP - General (Internal Medicine) Dayna Barker, MD as Referring Physician (Internal Medicine) Nche, Bonna Gains, NP as Nurse Practitioner (Internal Medicine)  Indicate any recent Medical Services you may have received from other than [facility] providers in the past year (date may be approximate).     Assessment:   This is a routine wellness examination for Renate.  Hearing/Vision screen Hearing Screening - Comments:: Denies hearing issues Vision Screening - Comments:: Regular eye exams, [facility]   Goals Addressed             This Visit's Progress    Patient Stated       11/26/2023, denies goals       Depression Screen    11/26/2023    1:34 PM 09/13/2023    1:38 PM 06/28/2023   11:57 AM 12/27/2022    8:50 AM 12/20/2022   11:17 AM 11/24/2022    1:04 PM 11/23/2021    3:22 PM  PHQ 2/9 Scores  PHQ - 2 Score 0 0 0 0 0 0 0  PHQ- 9 Score   2        Fall Risk    11/26/2023    2:33  AM 09/13/2023    1:38 PM 06/28/2023   11:00 AM 12/27/2022    8:50 AM 11/24/2022    1:02 PM  Fall Risk   Falls in the past year? 0 0 0 0 1  Comment     got tripped up  Number falls in past yr: 0 0 0  0  Injury with Fall? 0 0 0   0  Risk for fall due to : Medication side effect No Fall Risks No Fall Risks  Medication side effect  Follow up Falls prevention discussed;Falls evaluation completed Falls evaluation completed Falls evaluation completed  Falls prevention discussed;Education provided;Falls evaluation completed    MEDICARE RISK AT HOME: Medicare Risk at Home Any stairs in or around the home?: (Patient-Rptd) Yes If so, are there any without handrails?: (Patient-Rptd) No Home free of loose throw rugs in walkways, pet beds, electrical cords, etc?: (Patient-Rptd) Yes Adequate lighting in your home to reduce risk of falls?: (Patient-Rptd) Yes Life alert?: (Patient-Rptd) No Use of a cane, walker or w/c?: (Patient-Rptd) Yes Grab bars in the bathroom?: (Patient-Rptd) No Shower chair or bench in shower?: (Patient-Rptd) No Elevated toilet seat or a handicapped toilet?: (Patient-Rptd) No  TIMED UP AND GO:  Was the test performed?  No    Cognitive Function:    11/07/2017    2:13 PM 05/31/2016    9:38 AM  MMSE - Mini Mental State Exam  Orientation to time 5 5  Orientation to Place 5 5  Registration 3 3  Attention/ Calculation 5 0  Recall 3 2  Recall-comments  pt was unable to recall 1 of 3 words  Language- name 2 objects 2 0  Language- repeat 1 1  Language- follow 3 step command 3 3  Language- read & follow direction 1 0  Write a sentence 1 0  Copy design 1 0  Total score 30 19        11/26/2023    1:34 PM 11/24/2022    1:05 PM 11/23/2021    3:24 PM  6CIT Screen  What Year? 0 points 0 points 0 points  What month? 0 points 0 points 0 points  What time? 0 points 3 points 0 points  Count back from 20 0 points 0 points 0 points  Months in reverse 0 points 0 points 0 points  Repeat phrase 0 points 0 points 4 points  Total Score 0 points 3 points 4 points    Immunizations Immunization History  Administered Date(s) Administered   Fluad Quad(high Dose 65+) 06/10/2019, 08/09/2020, 08/09/2021,  11/21/2022   Fluad Trivalent(High Dose 65+) 06/28/2023   Influenza Split 07/27/2011, 08/19/2012   Influenza Whole 08/01/2007, 07/13/2008, 07/20/2009, 07/20/2010   Influenza, High Dose Seasonal PF 07/16/2018   Influenza,inj,Quad PF,6+ Mos 07/29/2013, 07/15/2014, 07/09/2015, 07/14/2016, 06/19/2017   PFIZER(Purple Top)SARS-COV-2 Vaccination 12/01/2019, 12/24/2019   Pneumococcal Conjugate-13 05/31/2016   Pneumococcal Polysaccharide-23 10/09/2001, 01/13/2014   Td 10/10/2003   Zoster Recombinant(Shingrix) 11/25/2020, 02/08/2021    TDAP status: Due, Education has been provided regarding the importance of this vaccine. Advised may receive this vaccine at local pharmacy or Health Dept. Aware to provide a copy of the vaccination record if obtained from local pharmacy or Health Dept. Verbalized acceptance and understanding.  Flu Vaccine status: Up to date  Pneumococcal vaccine status: Up to date  Covid-19 vaccine status: Information provided on how to obtain vaccines.   Qualifies for Shingles Vaccine? Yes   Zostavax completed Yes   Shingrix Completed?: Yes  Screening Tests Health Maintenance  Topic Date Due   OPHTHALMOLOGY EXAM  01/26/2021   COVID-19 Vaccine (3 - 2024-25 season) 01/07/2024 (Originally 06/10/2023)   FOOT EXAM  12/20/2023   HEMOGLOBIN A1C  12/26/2023   Diabetic kidney evaluation - Urine ACR  06/27/2024   Diabetic kidney evaluation - eGFR measurement  09/12/2024   Medicare Annual Wellness (AWV)  11/25/2024   Colonoscopy  12/15/2031   Pneumonia Vaccine 64+ Years old  Completed   INFLUENZA VACCINE  Completed   DEXA SCAN  Completed   Hepatitis C Screening  Completed   Zoster Vaccines- Shingrix  Completed   HPV VACCINES  Aged Out   DTaP/Tdap/Td  Discontinued    Health Maintenance  Health Maintenance Due  Topic Date Due   OPHTHALMOLOGY EXAM  01/26/2021    Colorectal cancer screening: No longer required.   Mammogram status: Completed 02/14/2023. Repeat every  year  Bone Density status: Completed 09/08/2019.  Lung Cancer Screening: (Low Dose CT Chest recommended if Age 33-80 years, 20 pack-year currently smoking OR have quit w/in 15years.) does not qualify.   Lung Cancer Screening Referral: no  Additional Screening:  Hepatitis C Screening: does qualify; Completed 05/31/2016  Vision Screening: Recommended annual ophthalmology exams for early detection of glaucoma and other disorders of the eye. Is the patient up to date with their annual eye exam?  Yes  Who is the provider or what is the name of the office in which the patient attends annual eye exams? [facility] If pt is not established with a provider, would they like to be referred to a provider to establish care? No .   Dental Screening: Recommended annual dental exams for proper oral hygiene  Diabetic Foot Exam: n/a  Community Resource Referral / Chronic Care Management: CRR required this visit?  No   CCM required this visit?  No     Plan:     I have personally reviewed and noted the following in the patient's chart:   Medical and social history Use of alcohol, tobacco or illicit drugs  Current medications and supplements including opioid prescriptions. Patient is not currently taking opioid prescriptions. Functional ability and status Nutritional status Physical activity Advanced directives List of other physicians Hospitalizations, surgeries, and ER visits in previous 12 months Vitals Screenings to include cognitive, depression, and falls Referrals and appointments  In addition, I have reviewed and discussed with patient certain preventive protocols, quality metrics, and best practice recommendations. A written personalized care plan for preventive services as well as general preventive health recommendations were provided to patient.     Barb Merino, LPN   1/61/0960   After Visit Summary: (MyChart) Due to this being a telephonic visit, the after visit  summary with patients personalized plan was offered to patient via MyChart   Nurse Notes: none

## 2023-11-28 ENCOUNTER — Other Ambulatory Visit: Payer: Self-pay | Admitting: Nurse Practitioner

## 2023-11-28 DIAGNOSIS — I10 Essential (primary) hypertension: Secondary | ICD-10-CM

## 2023-12-03 DIAGNOSIS — J301 Allergic rhinitis due to pollen: Secondary | ICD-10-CM | POA: Diagnosis not present

## 2023-12-12 DIAGNOSIS — J301 Allergic rhinitis due to pollen: Secondary | ICD-10-CM | POA: Diagnosis not present

## 2023-12-19 DIAGNOSIS — J301 Allergic rhinitis due to pollen: Secondary | ICD-10-CM | POA: Diagnosis not present

## 2023-12-26 ENCOUNTER — Ambulatory Visit (INDEPENDENT_AMBULATORY_CARE_PROVIDER_SITE_OTHER): Payer: Medicare Other | Admitting: Nurse Practitioner

## 2023-12-26 ENCOUNTER — Encounter: Payer: Self-pay | Admitting: Nurse Practitioner

## 2023-12-26 VITALS — BP 126/68 | HR 68 | Temp 98.7°F | Ht 66.0 in | Wt 259.0 lb

## 2023-12-26 DIAGNOSIS — G629 Polyneuropathy, unspecified: Secondary | ICD-10-CM

## 2023-12-26 DIAGNOSIS — N1831 Chronic kidney disease, stage 3a: Secondary | ICD-10-CM

## 2023-12-26 DIAGNOSIS — Z6841 Body Mass Index (BMI) 40.0 and over, adult: Secondary | ICD-10-CM

## 2023-12-26 DIAGNOSIS — J301 Allergic rhinitis due to pollen: Secondary | ICD-10-CM | POA: Diagnosis not present

## 2023-12-26 DIAGNOSIS — T380X5S Adverse effect of glucocorticoids and synthetic analogues, sequela: Secondary | ICD-10-CM | POA: Diagnosis not present

## 2023-12-26 DIAGNOSIS — E785 Hyperlipidemia, unspecified: Secondary | ICD-10-CM | POA: Diagnosis not present

## 2023-12-26 DIAGNOSIS — E1169 Type 2 diabetes mellitus with other specified complication: Secondary | ICD-10-CM

## 2023-12-26 DIAGNOSIS — E099 Drug or chemical induced diabetes mellitus without complications: Secondary | ICD-10-CM

## 2023-12-26 DIAGNOSIS — E66813 Obesity, class 3: Secondary | ICD-10-CM

## 2023-12-26 DIAGNOSIS — M79671 Pain in right foot: Secondary | ICD-10-CM

## 2023-12-26 DIAGNOSIS — J4551 Severe persistent asthma with (acute) exacerbation: Secondary | ICD-10-CM | POA: Diagnosis not present

## 2023-12-26 DIAGNOSIS — M79672 Pain in left foot: Secondary | ICD-10-CM

## 2023-12-26 LAB — LIPID PANEL
Cholesterol: 155 mg/dL (ref 0–200)
HDL: 77.4 mg/dL (ref >39.00)
LDL Cholesterol: 69 mg/dL (ref 0–99)
NonHDL: 77.52
Total CHOL/HDL Ratio: 2
Triglycerides: 44 mg/dL (ref 0.0–149.0)
VLDL: 8.8 mg/dL (ref 0.0–40.0)

## 2023-12-26 LAB — RENAL FUNCTION PANEL
Albumin: 4.1 g/dL (ref 3.5–5.2)
BUN: 14 mg/dL (ref 6–23)
CO2: 28 meq/L (ref 19–32)
Calcium: 9.2 mg/dL (ref 8.4–10.5)
Chloride: 104 meq/L (ref 96–112)
Creatinine, Ser: 0.88 mg/dL (ref 0.40–1.20)
GFR: 64.07 mL/min (ref >60.00)
Glucose, Bld: 84 mg/dL (ref 70–99)
Phosphorus: 4 mg/dL (ref 2.3–4.6)
Potassium: 3.8 meq/L (ref 3.5–5.1)
Sodium: 141 meq/L (ref 135–145)

## 2023-12-26 LAB — MICROALBUMIN / CREATININE URINE RATIO
Creatinine,U: 183.4 mg/dL
Microalb Creat Ratio: 7.8 mg/g (ref 0.0–30.0)
Microalb, Ur: 1.4 mg/dL (ref 0.0–1.9)

## 2023-12-26 LAB — HEMOGLOBIN A1C: Hgb A1c MFr Bld: 6.2 % (ref 4.6–6.5)

## 2023-12-26 NOTE — Assessment & Plan Note (Addendum)
 Calculated UACr. 3.91mg /g from 06/2023 results. Admits to poor diet choice No exercise regimen due to lack of motivation Advised to schedule DIABETES eye exam. Repeat UACr, lipid panel. bmp, and hgbA1c F/up in 6months

## 2023-12-26 NOTE — Assessment & Plan Note (Addendum)
 Persistent neuropathy: declined to use gabapentin and lyrica Advised about importance of adequate foot wear at all time, and proper skin care.

## 2023-12-26 NOTE — Assessment & Plan Note (Signed)
Repeat lipid panel No adverse effects with crestor 10mg  2x/week and zetia 10mg  Advised to try mediterranean diet

## 2023-12-26 NOTE — Assessment & Plan Note (Signed)
 Persistent daily wheezing.  Current use of motelukast, albuterol daily. She opted to use breo prn. Has appointment with asthma specialist once a year.  Advised to use breo daily and albuterol prn. Maintain f/up with Asthma specialist

## 2023-12-26 NOTE — Assessment & Plan Note (Signed)
 Advised about need for weight loss through diet modification and exercise. With limited mobility (knee pain), advised to do chair exercise and use of resistant bands. Wt Readings from Last 3 Encounters:  12/26/23 259 lb (117.5 kg)  11/08/23 258 lb 3.2 oz (117.1 kg)  09/13/23 257 lb 12.8 oz (116.9 kg)

## 2023-12-26 NOTE — Progress Notes (Signed)
 Established Patient Visit  Patient: Rachael Silva   DOB: 07/02/1946   78 y.o. Female  MRN: [id number] Visit Date: 12/26/2023  Subjective:    Chief Complaint  Patient presents with   Follow-up    6 month f/u    HPI Steroid-induced diabetes mellitus (HCC) Calculated UACr. 3.91mg /g from 06/2023 results. Admits to poor diet choice No exercise regimen due to lack of motivation Advised to schedule DIABETES eye exam. Repeat UACr, lipid panel. bmp, and hgbA1c F/up in 6months    Severe persistent asthma Persistent daily wheezing.  Current use of motelukast, albuterol daily. She opted to use breo prn. Has appointment with asthma specialist once a year.  Advised to use breo daily and albuterol prn. Maintain f/up with Asthma specialist  Peripheral polyneuropathy Persistent neuropathy: declined to use gabapentin and lyrica Advised about importance of adequate foot wear at all time, and proper skin care.  Class 2 severe obesity due to excess calories with serious comorbidity and body mass index (BMI) of 39.0 to 39.9 in adult [facility]) Advised about need for weight loss through diet modification and exercise. With limited mobility (knee pain), advised to do chair exercise and use of resistant bands. Wt Readings from Last 3 Encounters:  12/26/23 259 lb (117.5 kg)  11/08/23 258 lb 3.2 oz (117.1 kg)  09/13/23 257 lb 12.8 oz (116.9 kg)     Hyperlipidemia associated with type 2 diabetes mellitus (HCC) Repeat lipid panel No adverse effects with crestor 10mg  2x/week and zetia 10mg  Advised to try mediterranean diet   Wt Readings from Last 3 Encounters:  12/26/23 259 lb (117.5 kg)  11/08/23 258 lb 3.2 oz (117.1 kg)  09/13/23 257 lb 12.8 oz (116.9 kg)    Reviewed medical, surgical, and social history today  Medications: Outpatient Medications Prior to Visit  Medication Sig   albuterol (VENTOLIN HFA) 108 (90 Base) MCG/ACT inhaler Inhale 1-2 puffs into the lungs every 6  (six) hours as needed for wheezing or shortness of breath.   B Complex-C-Folic Acid (STRESS B COMPLEX PO) Take 1 tablet by mouth daily.   Calcium Carbonate-Vitamin D (CALTRATE 600+D PO) Take 1 tablet by mouth 2 (two) times daily.   cetirizine (ZYRTEC) 10 MG tablet TAKE 1 TABLET BY MOUTH EVERY DAY   co-enzyme Q-10 30 MG capsule Take 200 mg by mouth daily.   EPIPEN 2-PAK 0.3 MG/0.3ML SOAJ injection See admin instructions.   ezetimibe (ZETIA) 10 MG tablet TAKE 1 TABLET BY MOUTH EVERY DAY   fluticasone furoate-vilanterol (BREO ELLIPTA) 200-25 MCG/ACT AEPB Inhale 1 puff into the lungs daily.   furosemide (LASIX) 20 MG tablet TAKE 1/2 TABLET BY MOUTH DAILY   GARLIC PO Take 1 capsule by mouth daily.   montelukast (SINGULAIR) 10 MG tablet Take 1 tablet (10 mg total) by mouth daily.   omeprazole (PRILOSEC) 20 MG capsule Take 1 capsule (20 mg total) by mouth daily.   rosuvastatin (CRESTOR) 10 MG tablet TAKE 1 TABLET BY MOUTH 3 TIMES A WEEK.   [DISCONTINUED] amoxicillin (AMOXIL) 875 MG tablet    [DISCONTINUED] levofloxacin (LEVAQUIN) 750 MG tablet Take 750 mg by mouth daily.   [DISCONTINUED] predniSONE (DELTASONE) 10 MG tablet Take by mouth as directed.   No facility-administered medications prior to visit.   Reviewed past medical and social history.   ROS per HPI above      Objective:  BP 126/68 (BP Location: Right Arm, Patient Position: Sitting,  Cuff Size: Large)   Pulse 68   Temp 98.7 F (37.1 C) (Temporal)   Ht 5\' 6"  (1.676 m)   Wt 259 lb (117.5 kg)   SpO2 97%   BMI 41.80 kg/m      Physical Exam Vitals and nursing note reviewed.  Cardiovascular:     Rate and Rhythm: Normal rate and regular rhythm.     Pulses: Normal pulses.     Heart sounds: Normal heart sounds.  Pulmonary:     Effort: Pulmonary effort is normal.     Breath sounds: Normal breath sounds.  Neurological:     Mental Status: She is alert and oriented to person, place, and time.     No results found for any  visits on 12/26/23.    Assessment & Plan:    Problem List Items Addressed This Visit     Class 2 severe obesity due to excess calories with serious comorbidity and body mass index (BMI) of 39.0 to 39.9 in adult [facility])   Advised about need for weight loss through diet modification and exercise. With limited mobility (knee pain), advised to do chair exercise and use of resistant bands. Wt Readings from Last 3 Encounters:  12/26/23 259 lb (117.5 kg)  11/08/23 258 lb 3.2 oz (117.1 kg)  09/13/23 257 lb 12.8 oz (116.9 kg)         Hyperlipidemia associated with type 2 diabetes mellitus (HCC)   Repeat lipid panel No adverse effects with crestor 10mg  2x/week and zetia 10mg  Advised to try mediterranean diet      Relevant Orders   Lipid panel   Peripheral polyneuropathy   Persistent neuropathy: declined to use gabapentin and lyrica Advised about importance of adequate foot wear at all time, and proper skin care.      Severe persistent asthma   Persistent daily wheezing.  Current use of motelukast, albuterol daily. She opted to use breo prn. Has appointment with asthma specialist once a year.  Advised to use breo daily and albuterol prn. Maintain f/up with Asthma specialist      Steroid-induced diabetes mellitus (HCC) - Primary   Calculated UACr. 3.91mg /g from 06/2023 results. Admits to poor diet choice No exercise regimen due to lack of motivation Advised to schedule DIABETES eye exam. Repeat UACr, lipid panel. bmp, and hgbA1c F/up in 6months        Relevant Orders   Microalbumin / creatinine urine ratio   Hemoglobin A1c   Ambulatory referral to Podiatry   Other Visit Diagnoses       Stage 3a chronic kidney disease (HCC)       Relevant Orders   Renal Function Panel   Microalbumin / creatinine urine ratio     Foot pain, bilateral       Relevant Orders   Ambulatory referral to Podiatry      Return in about 6 months (around 06/27/2024) for HTN, DM, hyperlipidemia  (fasting).     Alysia Penna, NP

## 2023-12-26 NOTE — Patient Instructions (Signed)
 Go to lab Maintain Heart healthy diet and daily exercise. Maintain current medications.   Mediterranean Diet A Mediterranean diet is based on the traditions of countries on the [employer]. It focuses on eating more: Fruits and vegetables. Whole grains, beans, nuts, and seeds. Heart-healthy fats. These are fats that are good for your heart. It involves eating less: Dairy. Meat and eggs. Processed foods with added sugar, salt, and fat. This type of diet can help prevent certain conditions. It can also improve outcomes if you have a long-term (chronic) disease, such as kidney or heart disease. What are tips for following this plan? Reading food labels Check packaged foods for: The serving size. For foods such as rice and pasta, the serving size is the amount of cooked product, not dry. The total fat. Avoid foods with saturated fat or trans fat. Added sugars, such as corn syrup. Shopping  Try to have a balanced diet. Buy a variety of foods, such as: Fresh fruits and vegetables. You may be able to get these from local farmers markets. You can also buy them frozen. Grains, beans, nuts, and seeds. Some of these can be bought in bulk. Fresh seafood. Poultry and eggs. Low-fat dairy products. Buy whole ingredients instead of foods that have already been packaged. If you can't get fresh seafood, buy precooked frozen shrimp or canned fish, such as tuna, salmon, or sardines. Stock your pantry so you always have certain foods on hand, such as olive oil, canned tuna, canned tomatoes, rice, pasta, and beans. Cooking Cook foods with extra-virgin olive oil instead of using butter or other vegetable oils. Have meat as a side dish. Have vegetables or grains as your main dish. This means having meat in small portions or adding small amounts of meat to foods like pasta or stew. Use beans or vegetables instead of meat in common dishes like chili or lasagna. Try out different cooking methods. Try  roasting, broiling, steaming, and sauting vegetables. Add frozen vegetables to soups, stews, pasta, or rice. Add nuts or seeds for added healthy fats and plant protein at each meal. You can add these to yogurt, salads, or vegetable dishes. Marinate fish or vegetables using olive oil, lemon juice, garlic, and fresh herbs. Meal planning Plan to eat a vegetarian meal one day each week. Try to work up to two vegetarian meals, if possible. Eat seafood two or more times a week. Have healthy snacks on hand. These may include: Vegetable sticks with hummus. Greek yogurt. Fruit and nut trail mix. Eat balanced meals. These should include: Fruit: 2-3 servings a day. Vegetables: 4-5 servings a day. Low-fat dairy: 2 servings a day. Fish, poultry, or lean meat: 1 serving a day. Beans and legumes: 2 or more servings a week. Nuts and seeds: 1-2 servings a day. Whole grains: 6-8 servings a day. Extra-virgin olive oil: 3-4 servings a day. Limit red meat and sweets to just a few servings a month. Lifestyle  Try to cook and eat meals with your family. Drink enough fluid to keep your pee (urine) pale yellow. Be active every day. This includes: Aerobic exercise, which is exercise that causes your heart to beat faster. Examples include running and swimming. Leisure activities like gardening, walking, or housework. Get 7-8 hours of sleep each night. Drink red wine if your provider says you can. A glass of wine is 5 oz (150 mL). You may be allowed to have: Up to 1 glass a day if you're female and not pregnant. Up to 2  glasses a day if you're female. What foods should I eat? Fruits Apples. Apricots. Avocado. Berries. Bananas. Cherries. Dates. Figs. Grapes. Lemons. Melon. Oranges. Peaches. Plums. Pomegranate. Vegetables Artichokes. Beets. Broccoli. Cabbage. Carrots. Eggplant. Green beans. Chard. Kale. Spinach. Onions. Leeks. Peas. Squash. Tomatoes. Peppers. Radishes. Grains Whole-grain pasta. Brown rice.  Bulgur wheat. Polenta. Couscous. Whole-wheat bread. Orpah Cobb. Meats and other proteins Beans. Almonds. Sunflower seeds. Pine nuts. Peanuts. Cod. Salmon. Scallops. Shrimp. Tuna. Tilapia. Clams. Oysters. Eggs. Chicken or [place] without skin. Dairy Low-fat milk. Cheese. Greek yogurt. Fats and oils Extra-virgin olive oil. Avocado oil. Grapeseed oil. Beverages Water. Red wine. Herbal tea. Sweets and desserts Greek yogurt with honey. Baked apples. Poached pears. Trail mix. Seasonings and condiments Basil. Cilantro. Coriander. Cumin. Mint. Parsley. Sage. Rosemary. Tarragon. Garlic. Oregano. Thyme. Pepper. Balsamic vinegar. Tahini. Hummus. Tomato sauce. Olives. Mushrooms. The items listed above may not be all the foods and drinks you can have. Talk to a dietitian to learn more. What foods should I limit? This is a list of foods that should be eaten rarely. Fruits Fruit canned in syrup. Vegetables Deep-fried potatoes, like [place] fries. Grains Packaged pasta or rice dishes. Cereal with added sugar. Snacks with added sugar. Meats and other proteins Beef. Pork. Lamb. Chicken or [place] with skin. Hot dogs. Tomasa Blase. Dairy Ice cream. Sour cream. Whole milk. Fats and oils Butter. Canola oil. Vegetable oil. Beef fat (tallow). Lard. Beverages Juice. Sugar-sweetened soft drinks. Beer. Liquor and spirits. Sweets and desserts Cookies. Cakes. Pies. Candy. Seasonings and condiments Mayonnaise. Pre-made sauces and marinades. The items listed above may not be all the foods and drinks you should limit. Talk to a dietitian to learn more. Where to find more information American Heart Association (AHA): heart.org This information is not intended to replace advice given to you by your health care provider. Make sure you discuss any questions you have with your health care provider. Document Revised: 01/07/2023 Document Reviewed: 01/07/2023 Elsevier Patient Education  2024 Elsevier Inc. How to  Increase Your Level of Physical Activity Getting regular physical activity is important for your overall health and well-being. Most people do not get enough exercise. There are easy ways to increase your level of physical activity, even if you have not been very active in the past or if you are just starting out. What are the benefits of physical activity? Physical activity has many short-term and long-term benefits. Being active on a regular basis can improve your physical and mental health as well as provide other benefits. Physical health benefits Helping you lose weight or maintain a healthy weight. Strengthening your muscles and bones. Reducing your risk of certain long-term (chronic) diseases, including heart disease, cancer, and diabetes. Being able to move around more easily and for longer periods of time without getting tired (increased endurance or stamina). Improving your ability to fight off illness (enhanced immunity). Being able to sleep better. Helping you stay healthy as you get older, including: Helping you stay mobile, or capable of walking and moving around. Preventing accidents, such as falls. Increasing life expectancy. Mental health benefits Boosting your mood and improving your self-esteem. Lowering your chance of having mental health problems, such as depression or anxiety. Helping you feel good about your body. Other benefits Finding new sources of fun and enjoyment. Meeting new people who share a common interest. Before you begin If you have a chronic illness or have not been active for a while, check with your health care provider about how to get started. Ask your  health care provider what activities are safe for you. Start out slowly. Walking or doing some simple chair exercises is a good place to start, especially if you have not been active before or for a long time. Set goals that you can work toward. Ask your health care provider how much exercise is best  for you. In general, most adults should: Do moderate-intensity exercise for at least 150 minutes each week (30 minutes on most days of the week) or vigorous exercise for at least 75 minutes each week, or a combination of these. Moderate-intensity exercise can include walking at a quick pace, biking, yoga, water aerobics, or gardening. Vigorous exercise involves activities that take more effort, such as jogging or running, playing sports, swimming laps, or jumping rope. Do strength exercises on at least 2 days each week. This can include weight lifting, body weight exercises, and resistance-band exercises. How to be more physically active Make a plan  Try to find activities that you enjoy. You are more likely to commit to an exercise routine if it does not feel like a chore. If you have bone or joint problems, choose low-impact exercises, like walking or swimming. Use these tips for being successful with an exercise plan: Find a workout partner for accountability. Join a group or class, such as an aerobics class, cycling class, or sports team. Make family time active. Go for a walk, bike, or swim. Include a variety of exercises each week. Consider using a fitness tracker, such as a mobile phone app or a device worn like a watch, that will count the number of steps you take each day. Many people strive to reach 10,000 steps a day. Find ways to be active in your daily routines Besides your formal exercise plans, you can find ways to do physical activity during your daily routines, such as: Walking or biking to work or to the store. Taking the stairs instead of the elevator. Parking farther away from the door at work or at the store. Planning walking meetings. Walking around while you are on the phone. Where to find more information Centers for Disease Control and Prevention: [employer].com.pt President's Council on Fitness, Sports & Nutrition: www.fitness.gov ChooseMyPlate:  http://www.harvey.com/ Contact a health care provider if: You have headaches, muscle aches, or joint pain that is concerning. You feel dizzy or light-headed while exercising. You faint. You feel your heart skipping, racing, or fluttering. You have chest pain while exercising. Summary Exercise benefits your mind and body at any age, even if you are just starting out. If you have a chronic illness or have not been active for a while, check with your health care provider before increasing your physical activity. Choose activities that are safe and enjoyable for you. Ask your health care provider what activities are safe for you. Start slowly. Tell your health care provider if you have problems as you start to increase your activity level. This information is not intended to replace advice given to you by your health care provider. Make sure you discuss any questions you have with your health care provider. Document Revised: 01/21/2021 Document Reviewed: 01/21/2021 Elsevier Patient Education  2024 [employer].

## 2023-12-27 ENCOUNTER — Encounter: Payer: Self-pay | Admitting: Nurse Practitioner

## 2023-12-31 DIAGNOSIS — J301 Allergic rhinitis due to pollen: Secondary | ICD-10-CM | POA: Diagnosis not present

## 2024-01-03 ENCOUNTER — Ambulatory Visit: Payer: Medicare Other | Admitting: *Deleted

## 2024-01-03 VITALS — BP 176/80 | HR 77 | Temp 97.1°F | Resp 20 | Ht 66.0 in | Wt 257.8 lb

## 2024-01-03 DIAGNOSIS — J4550 Severe persistent asthma, uncomplicated: Secondary | ICD-10-CM

## 2024-01-03 MED ORDER — BENRALIZUMAB 30 MG/ML ~~LOC~~ SOSY
30.0000 mg | PREFILLED_SYRINGE | Freq: Once | SUBCUTANEOUS | Status: AC
  Administered 2024-01-03: 30 mg via SUBCUTANEOUS
  Filled 2024-01-03: qty 1

## 2024-01-03 NOTE — Progress Notes (Signed)
 Diagnosis: Asthma  Provider:  Chilton Greathouse MD  Procedure: Injection  Fasenra (Benralizumab), Dose: 30 mg, Site: subcutaneous, Number of injections: 1  Injection Site(s): Right arm  Post Care: Observation period completed  Discharge: Condition: Good, Destination: Home . AVS Declined  Performed by:  Forrest Moron, RN

## 2024-01-09 DIAGNOSIS — J301 Allergic rhinitis due to pollen: Secondary | ICD-10-CM | POA: Diagnosis not present

## 2024-01-14 ENCOUNTER — Ambulatory Visit: Payer: Self-pay

## 2024-01-14 NOTE — Telephone Encounter (Signed)
 Copied from CRM [id number]. Topic: Clinical - Red Word Triage >> Jan 14, 2024  1:39 PM Denese Killings wrote: Red Word that prompted transfer to Nurse Triage: Patient has swelling in her feet. She states that this is the second day and the right foot is worse than the other. *Difficult to walk    Chief Complaint: Both feet swollen Symptoms: Pain Frequency: Yesterday Pertinent Negatives: Patient denies any other symptoms. Disposition: [] ED /[] Urgent Care (no appt availability in office) / [x] Appointment(In office/virtual)/ []  [facility]/ [] Home Care/ [] Refused Recommended Disposition /[] [facility]/ []  Follow-up with PCP Additional Notes: Agrees with appointment.  Reason for Disposition  [1] MILD swelling of both ankles (i.e., pedal edema) AND [2] new-onset or worsening  Answer Assessment - Initial Assessment Questions 1. ONSET: "When did the swelling start?" (e.g., minutes, hours, days)     Yesterday 2. LOCATION: "What part of the leg is swollen?"  "Are both legs swollen or just one leg?"     Both feet 3. SEVERITY: "How bad is the swelling?" (e.g., localized; mild, moderate, severe)   - Localized: Small area of swelling localized to one leg.   - MILD pedal edema: Swelling limited to foot and ankle, pitting edema < 1/4 inch (6 mm) deep, rest and elevation eliminate most or all swelling.   - MODERATE edema: Swelling of lower leg to knee, pitting edema > 1/4 inch (6 mm) deep, rest and elevation only partially reduce swelling.   - SEVERE edema: Swelling extends above knee, facial or hand swelling present.      Mild 4. REDNESS: "Does the swelling look red or infected?"     No 5. PAIN: "Is the swelling painful to touch?" If Yes, ask: "How painful is it?"   (Scale 1-10; mild, moderate or severe)     Right is worse - 3-4 6. FEVER: "Do you have a fever?" If Yes, ask: "What is it, how was it measured, and when did it start?"      no 7. CAUSE: "What do you think is causing the  leg swelling?"     Unsure 8. MEDICAL HISTORY: "Do you have a history of blood clots (e.g., DVT), cancer, heart failure, kidney disease, or liver failure?"     No 9. RECURRENT SYMPTOM: "Have you had leg swelling before?" If Yes, ask: "When was the last time?" "What happened that time?"     Yes 10. OTHER SYMPTOMS: "Do you have any other symptoms?" (e.g., chest pain, difficulty breathing)       No 11. PREGNANCY: "Is there any chance you are pregnant?" "When was your last menstrual period?"       No  Protocols used: Leg Swelling and Edema-A-AH

## 2024-01-14 NOTE — Telephone Encounter (Signed)
 Noted. Appointment scheduled for 01/16/24.

## 2024-01-16 ENCOUNTER — Ambulatory Visit (INDEPENDENT_AMBULATORY_CARE_PROVIDER_SITE_OTHER): Admitting: Internal Medicine

## 2024-01-16 ENCOUNTER — Encounter: Payer: Self-pay | Admitting: Internal Medicine

## 2024-01-16 VITALS — BP 156/90 | HR 88 | Temp 97.3°F | Resp 18 | Wt 254.4 lb

## 2024-01-16 DIAGNOSIS — M7989 Other specified soft tissue disorders: Secondary | ICD-10-CM | POA: Diagnosis not present

## 2024-01-16 DIAGNOSIS — J301 Allergic rhinitis due to pollen: Secondary | ICD-10-CM | POA: Diagnosis not present

## 2024-01-16 NOTE — Progress Notes (Signed)
 [facility]IMARY [facility] [address] [place] [address] Dept: [phone] Dept Fax: [phone]  Acute Care Office Visit  Subjective:   Rachael Silva 1946/01/23 01/16/2024  Chief Complaint  Patient presents with   Foot Swelling    Pt C/O of feet swelling; symptoms happened over the weekend with mild pain more on right foot. Pt took lasix   HM due- eye exam (DM)     HPI:  Discussed the use of AI scribe software for clinical note transcription with the patient, who gave verbal consent to proceed.  History of Present Illness   The patient, with a history of peripheral artery disease (PAD), presents with bilateral foot swelling that began on Saturday. The swelling was most prominent on the right leg. The patient managed the swelling with ice, elevation, and daily Lasix, which has resulted in a gradual reduction in swelling. The right foot is typically larger than the left, which she attributes to a previous knee injury. The patient also reports one episode last night of transient pain that ascended the right leg, which has not recurred. The patient admits to consuming a high-sodium diet while visiting her mother in a facility in [place] last week (reports eating bacon and sausage), which may have contributed to the swelling.       The following portions of the patient's history were reviewed and updated as appropriate: past medical history, past surgical history, family history, social history, allergies, medications, and problem list.   Patient Active Problem List   Diagnosis Date Noted   Severe persistent asthma 12/05/2020   Hx of herpes zoster 05/07/2020   Peripheral polyneuropathy 05/07/2018   Essential hypertension 05/07/2018   Lymphedema of both lower extremities 03/20/2018   Positive ANA (antinuclear antibody) 03/20/2018   Eosinophilia 02/21/2018   Hyperlipidemia associated with type 2 diabetes mellitus (HCC) 12/03/2016    Encounter for screening mammogram for breast cancer 05/17/2016   OSA on CPAP 05/11/2016   Memory impairment 05/11/2015   Excessive daytime sleepiness 05/11/2015   Snoring 05/05/2015   Class 2 severe obesity due to excess calories with serious comorbidity and body mass index (BMI) of 39.0 to 39.9 in adult (HCC) 05/05/2015   Steroid-induced diabetes mellitus (HCC) 11/11/2014   Chronic cough 06/18/2014   EDEMA 03/03/2009   Sinusitis, chronic 08/18/2008   Bursitis, shoulder 04/15/2008   GERD (gastroesophageal reflux disease) 08/01/2007   Allergic rhinitis 05/15/2007   MURMUR 05/15/2007   URINARY INCONTINENCE 05/15/2007   Asthma, chronic 05/15/2007   Past Medical History:  Diagnosis Date   Abnormal mammogram with microcalcification 06/06/2011   Allergy    allergic rhinitis   Angioedema 02/2006   Arthritis    OA of knees   Asthma    Cellulitis of finger of right hand 02/14/2018   Complication of anesthesia    apnea- sleep   Dizziness 02/26/2017   Elevated liver enzymes 03/20/2018   Frequent headaches    Glucocorticoids and synthetic analogues causing adverse effect in therapeutic use 08/27/2014   Hiatal hernia    HOT FLASHES 02/18/2008   Qualifier: Diagnosis of   By: Milinda Antis MD, Colon Flattery      Hypertension    Hypothyroid    Labile blood pressure    Long term current use of systemic steroids 08/27/2014   Lower extremity edema    Lung disease    Night sweats 12/01/2016   Pneumonia    Pneumonia, eosinophilic (HCC)    Productive cough 05/27/2014   Shortness  of breath dyspnea    Sleep apnea    Swelling of right hand 02/14/2018   Syncopal episodes 05/11/2016   Urinary incontinence    Weight gain due to medication 02/18/2008   Patient with diagnoses of underlying obesity.  Started on prednisone (high dose 60 mg, with tapering dose) on September 30 for suspected hypersensitivity pneumonitis versus ABPA eosinophilic pneumonia      Wheezing    Past Surgical History:  Procedure  Laterality Date   BREAST BIOPSY Left 2013   NEG   BREAST SURGERY  1966   breast biopsy   CESAREAN SECTION     x2   FLEXIBLE BRONCHOSCOPY N/A 09/16/2015   Procedure: FLEXIBLE BRONCHOSCOPY WITH FLURO ;  Surgeon: Stephanie Acre, MD;  Location: [facility] ORS;  Service: Pulmonary;  Laterality: N/A;   Family History  Problem Relation Age of Onset   Diabetes Mother        pre-diabetic   Heart disease Mother        ? heart disease   Emphysema Mother    Thyroid disease Mother    Macular degeneration Mother    Cancer Father        prostate cancer   Dementia Father    Spina bifida Daughter    Miscarriages / [place] Son    Diabetes Maternal Uncle    Kidney disease Half-Brother    Kidney disease Half-Sister    Spina bifida Half-Sister    Hyperlipidemia Half-Sister    Breast cancer Neg Hx     Current Outpatient Medications:    albuterol (VENTOLIN HFA) 108 (90 Base) MCG/ACT inhaler, Inhale 1-2 puffs into the lungs every 6 (six) hours as needed for wheezing or shortness of breath., Disp: 8.5 each, Rfl: 3   B Complex-C-Folic Acid (STRESS B COMPLEX PO), Take 1 tablet by mouth daily., Disp: , Rfl:    Calcium Carbonate-Vitamin D (CALTRATE 600+D PO), Take 1 tablet by mouth 2 (two) times daily., Disp: , Rfl:    cetirizine (ZYRTEC) 10 MG tablet, TAKE 1 TABLET BY MOUTH EVERY DAY, Disp: 90 tablet, Rfl: 2   co-enzyme Q-10 30 MG capsule, Take 200 mg by mouth daily., Disp: , Rfl:    EPIPEN 2-PAK 0.3 MG/0.3ML SOAJ injection, See admin instructions., Disp: , Rfl: 99   ezetimibe (ZETIA) 10 MG tablet, TAKE 1 TABLET BY MOUTH EVERY DAY, Disp: 90 tablet, Rfl: 0   fluticasone furoate-vilanterol (BREO ELLIPTA) 200-25 MCG/ACT AEPB, Inhale 1 puff into the lungs daily., Disp: 60 each, Rfl: 5   furosemide (LASIX) 20 MG tablet, TAKE 1/2 TABLET BY MOUTH DAILY, Disp: 45 tablet, Rfl: 0   GARLIC PO, Take 1 capsule by mouth daily., Disp: , Rfl:    montelukast (SINGULAIR) 10 MG tablet, Take 1 tablet (10 mg total) by mouth  daily., Disp: 90 tablet, Rfl: 2   omeprazole (PRILOSEC) 20 MG capsule, Take 1 capsule (20 mg total) by mouth daily., Disp: , Rfl:    rosuvastatin (CRESTOR) 10 MG tablet, TAKE 1 TABLET BY MOUTH 3 TIMES A WEEK., Disp: 36 tablet, Rfl: 1 Allergies  Allergen Reactions   Lisinopril Diarrhea   Atorvastatin     Muscle pain    Erythromycin     REACTION: feels sick     ROS: A complete ROS was performed with pertinent positives/negatives noted in the HPI. The remainder of the ROS are negative.    Objective:   Today's Vitals   01/16/24 1348  BP: (!) 156/90  Pulse: 88  Resp: 18  Temp: (!) 97.3 F (36.3 C)  TempSrc: Temporal  SpO2: 97%  Weight: 254 lb 6.4 oz (115.4 kg)  PainSc: 0-No pain    GENERAL: Well-appearing, in NAD. Well nourished.  SKIN: Pink, warm and dry.  RESPIRATORY: Chest wall symmetrical. Respirations even and non-labored.  CARDIAC:  Peripheral pulses 2+ bilaterally.  MSK: Muscle tone and strength appropriate for age. Joints w/o tenderness, redness, or swelling. EXTREMITIES: Without clubbing, cyanosis. < 1+ nonpitting edema to right ankle.  NEUROLOGIC: No motor or sensory deficits. Steady, even gait.  PSYCH/MENTAL STATUS: Alert, oriented x 3. Cooperative, appropriate mood and affect.    No results found for any visits on 01/16/24.    Assessment & Plan:  Assessment and Plan    Leg Swelling Intermittent right foot swelling, likely due to dietary sodium. No signs of infection or injury. Blood clot unlikely. - Continue Lasix daily. - Elevate feet and rest. - If swelling persists after usual Lasix dose, take an additional half tablet. - If swelling does not improve despite additional lasix dose, schedule a follow-up appointment.       No orders of the defined types were placed in this encounter.  No orders of the defined types were placed in this encounter.  Lab Orders  No laboratory test(s) ordered today   No images are attached to the encounter or orders  placed in the encounter.  Return if symptoms worsen or fail to improve.   Salvatore Decent, FNP

## 2024-01-16 NOTE — Patient Instructions (Signed)
 Continue with lasix as prescribed  Elevate legs when possible   If swelling persists despite taking 1/2 tablet of lasix, you can take an additional 1/2 tablet. If swelling continues despite additional lasix, come see primary care

## 2024-01-23 DIAGNOSIS — J301 Allergic rhinitis due to pollen: Secondary | ICD-10-CM | POA: Diagnosis not present

## 2024-01-29 DIAGNOSIS — J301 Allergic rhinitis due to pollen: Secondary | ICD-10-CM | POA: Diagnosis not present

## 2024-01-30 DIAGNOSIS — J301 Allergic rhinitis due to pollen: Secondary | ICD-10-CM | POA: Diagnosis not present

## 2024-02-06 DIAGNOSIS — J301 Allergic rhinitis due to pollen: Secondary | ICD-10-CM | POA: Diagnosis not present

## 2024-02-09 ENCOUNTER — Other Ambulatory Visit: Payer: Self-pay | Admitting: Nurse Practitioner

## 2024-02-09 DIAGNOSIS — E782 Mixed hyperlipidemia: Secondary | ICD-10-CM

## 2024-02-09 DIAGNOSIS — I10 Essential (primary) hypertension: Secondary | ICD-10-CM

## 2024-02-18 ENCOUNTER — Telehealth (INDEPENDENT_AMBULATORY_CARE_PROVIDER_SITE_OTHER): Admitting: Family Medicine

## 2024-02-18 ENCOUNTER — Encounter: Payer: Self-pay | Admitting: Family Medicine

## 2024-02-18 DIAGNOSIS — J4551 Severe persistent asthma with (acute) exacerbation: Secondary | ICD-10-CM

## 2024-02-18 DIAGNOSIS — J309 Allergic rhinitis, unspecified: Secondary | ICD-10-CM | POA: Diagnosis not present

## 2024-02-18 NOTE — Progress Notes (Signed)
 Virtual Visit via Video Note  I connected with Rachael Silva on 02/18/2024 at  4:00 PM EDT by a video enabled telemedicine application and verified that I am speaking with the correct person using two identifiers.  Location: Patient: home Provider: office   I discussed the limitations of evaluation and management by telemedicine and the availability of in person appointments. The patient expressed understanding and agreed to proceed.    Chief Complaint  Patient presents with   URI    Pt c/o of cough and runny nose for 2 weeks with yellowish/clear phlegm. Pt used OTC mediation for symptoms but voiced it hasn't helped much; no fever or body aches are present    Discussed the use of AI scribe software for clinical note transcription with the patient, who gave verbal consent to proceed.  History of Present Illness   Rachael Silva is a 78 year old female with asthma who presents with a persistent cough and cold symptoms.  She has been experiencing cold-like symptoms for approximately two weeks, with a runny nose and a persistent cough being the most bothersome. Initially, the sputum was clear but has since become colored. No chest pain, shortness of breath, or wheezing. She uses a CPAP machine for sleep and has been taking over-the-counter cold medications, but the symptoms persist.  Her asthma is managed with Breo Ellipta , montelukast , and albuterol  inhalers. For allergies, she takes cetirizine  10 mg and montelukast . She has not experienced any fever during this period.          Observations/Objective: There were no vitals filed for this visit.  Gen: NAD, resting comfortably HEENT: EOMI Pulm: NWOB Skin: no rash on face Neuro: no facial asymmetry or dysmetria Psych: Normal affect   Assessment and Plan: Assessment and Plan Assessment and Plan     Asthma with cough Asthma managed with Breo Ellipta , montelukast , and albuterol  inhalers. No current wheezing or dyspnea, but  persistent cough with sputum color change raises concern for infection or exacerbation. Over-the-counter medications and asthma inhalers have been ineffective.  Patient high risk for severe complications including advanced age, chronic respiratory disease, diabetes, OSA - Advise in-person evaluation to assess lung sounds and vitals. - Instruct to seek emergency care if experiencing severe symptoms such as chest pain, dyspnea, or wheezing.  Allergic rhinitis Chronic allergic rhinitis managed with cetirizine  and montelukast .            I discussed the assessment and treatment plan with the patient. The patient was provided an opportunity to ask questions and all were answered. The patient agreed with the plan and demonstrated an understanding of the instructions.   The patient was advised to call back or seek an in-person evaluation if the symptoms worsen or if the condition fails to improve as anticipated.     Catheryn Cluck, MD

## 2024-02-20 ENCOUNTER — Ambulatory Visit: Admitting: Nurse Practitioner

## 2024-02-20 DIAGNOSIS — J301 Allergic rhinitis due to pollen: Secondary | ICD-10-CM | POA: Diagnosis not present

## 2024-02-22 ENCOUNTER — Telehealth: Payer: Self-pay

## 2024-02-22 NOTE — Telephone Encounter (Signed)
 Our office received Physician's order  form requesting to have physician to sign off on equipment. Per patient's PCP Kathrene Parents, NP form needs to be faxed to sleep specialist.   I called and left a voice message for patient to ask who is the person she sees for her CPAP machine and supplies

## 2024-02-22 NOTE — Telephone Encounter (Signed)
 Called and left a detailed voice message for patient per DPR on file asking to give me a call back at the office at [phone]

## 2024-02-28 ENCOUNTER — Ambulatory Visit (INDEPENDENT_AMBULATORY_CARE_PROVIDER_SITE_OTHER)

## 2024-02-28 VITALS — BP 157/92 | HR 77 | Temp 97.7°F | Resp 20 | Ht 66.0 in | Wt 252.0 lb

## 2024-02-28 DIAGNOSIS — J4550 Severe persistent asthma, uncomplicated: Secondary | ICD-10-CM

## 2024-02-28 MED ORDER — BENRALIZUMAB 30 MG/ML ~~LOC~~ SOSY
30.0000 mg | PREFILLED_SYRINGE | Freq: Once | SUBCUTANEOUS | Status: AC
  Administered 2024-02-28: 30 mg via SUBCUTANEOUS
  Filled 2024-02-28: qty 1

## 2024-02-28 NOTE — Progress Notes (Signed)
 Diagnosis: Asthma  Provider:  Mannam, Praveen MD  Procedure: Injection  Fasenra  (Benralizumab ), Dose: 30 mg, Site: subcutaneous, Number of injections: 1  Injection Site(s): Left arm  Post Care: Patient declined observation  Discharge: Condition: Good, Destination: Home . AVS Declined  Performed by:  Lakota Schweppe, RN

## 2024-03-12 DIAGNOSIS — J301 Allergic rhinitis due to pollen: Secondary | ICD-10-CM | POA: Diagnosis not present

## 2024-03-14 ENCOUNTER — Ambulatory Visit (INDEPENDENT_AMBULATORY_CARE_PROVIDER_SITE_OTHER): Admitting: Nurse Practitioner

## 2024-03-14 ENCOUNTER — Encounter: Payer: Self-pay | Admitting: Nurse Practitioner

## 2024-03-14 VITALS — BP 136/74 | HR 70 | Temp 98.6°F | Ht 66.0 in | Wt 251.8 lb

## 2024-03-14 DIAGNOSIS — J4541 Moderate persistent asthma with (acute) exacerbation: Secondary | ICD-10-CM | POA: Diagnosis not present

## 2024-03-14 MED ORDER — MOXIFLOXACIN HCL 400 MG PO TABS
400.0000 mg | ORAL_TABLET | Freq: Every day | ORAL | 0 refills | Status: AC
Start: 2024-03-14 — End: 2024-03-21

## 2024-03-14 MED ORDER — MONTELUKAST SODIUM 10 MG PO TABS
10.0000 mg | ORAL_TABLET | Freq: Every day | ORAL | 3 refills | Status: AC
Start: 2024-03-14 — End: ?

## 2024-03-14 MED ORDER — PREDNISONE 20 MG PO TABS: 40.0000 mg | ORAL_TABLET | Freq: Every day | ORAL | 0 refills | Status: DC

## 2024-03-14 NOTE — Patient Instructions (Signed)
 Continue robitussin as needed Maintain adequate oral hydration Use albuterol  2puffs 3x/day x 2days, then as needed Resume montelukast  10mg  daily Hold breo inhaler while taking oral prednisone . Start avelox oral antibiotics x 7days Call office if no improvement in 1weeks.

## 2024-03-14 NOTE — Assessment & Plan Note (Signed)
 Acute asthma exacerbation associated with chills and fatigue. Onset 2months ago, waxing and waning. No improvement with robitussin Current use of breo inhaler daily Does not use albuterol  or montelukast   Peak flow reading today: 160 Continue robitussin as needed Maintain adequate oral hydration Use albuterol  2puffs 3x/day x 2days, then as needed Resume montelukast  10mg  daily Hold breo inhaler while taking oral prednisone . Start avelox oral antibiotics x 7days Call office if no improvement in 1weeks.

## 2024-03-14 NOTE — Progress Notes (Signed)
 Established Patient Visit  Patient: Rachael Silva   DOB: 05/02/1946   78 y.o. Female  MRN: [id number] Visit Date: 03/14/2024  Subjective:     Chief Complaint  Patient presents with   Sore Throat   Cough    Sneezing    HPI Asthma, chronic Acute asthma exacerbation associated with chills and fatigue. Onset 2months ago, waxing and waning. No improvement with robitussin Current use of breo inhaler daily Does not use albuterol  or montelukast   Peak flow reading today: 160 Continue robitussin as needed Maintain adequate oral hydration Use albuterol  2puffs 3x/day x 2days, then as needed Resume montelukast  10mg  daily Hold breo inhaler while taking oral prednisone . Start avelox oral antibiotics x 7days Call office if no improvement in 1weeks.  Reviewed medical, surgical, and social history today  Medications: Outpatient Medications Prior to Visit  Medication Sig   albuterol  (VENTOLIN  HFA) 108 (90 Base) MCG/ACT inhaler Inhale 1-2 puffs into the lungs every 6 (six) hours as needed for wheezing or shortness of breath.   B Complex-C-Folic Acid  (STRESS B COMPLEX PO) Take 1 tablet by mouth daily.   Calcium  Carbonate-Vitamin D  (CALTRATE 600+D PO) Take 1 tablet by mouth 2 (two) times daily.   cetirizine  (ZYRTEC ) 10 MG tablet TAKE 1 TABLET BY MOUTH EVERY DAY   co-enzyme Q-10 30 MG capsule Take 200 mg by mouth daily.   EPIPEN  2-PAK 0.3 MG/0.3ML SOAJ injection See admin instructions.   ezetimibe  (ZETIA ) 10 MG tablet TAKE 1 TABLET BY MOUTH EVERY DAY   fluticasone  furoate-vilanterol (BREO ELLIPTA ) 200-25 MCG/ACT AEPB Inhale 1 puff into the lungs daily.   furosemide  (LASIX ) 20 MG tablet Take 0.5 tablets (10 mg total) by mouth daily.   GARLIC PO Take 1 capsule by mouth daily.   omeprazole  (PRILOSEC) 20 MG capsule Take 1 capsule (20 mg total) by mouth daily.   rosuvastatin  (CRESTOR ) 10 MG tablet TAKE 1 TABLET BY MOUTH 3 TIMES A WEEK.   [DISCONTINUED] Dextromethorphan-guaiFENesin   (MUCINEX  DM PO) Take by mouth.   [DISCONTINUED] Dextromethorphan-guaiFENesin  (ROBITUSSIN DM PO) Take by mouth.   [DISCONTINUED] montelukast  (SINGULAIR ) 10 MG tablet Take 1 tablet (10 mg total) by mouth daily.   No facility-administered medications prior to visit.   Reviewed past medical and social history.   ROS per HPI above      Objective:  BP 136/74 (BP Location: Right Arm, Patient Position: Sitting, Cuff Size: Large)   Pulse 70   Temp 98.6 F (37 C) (Oral)   Ht 5\' 6"  (1.676 m)   Wt 251 lb 12.8 oz (114.2 kg)   SpO2 97%   BMI 40.64 kg/m      Physical Exam Vitals reviewed.  HENT:     Mouth/Throat:     Mouth: Mucous membranes are moist.     Pharynx: Oropharynx is clear. Uvula midline.     Tonsils: No tonsillar exudate or tonsillar abscesses.  Pulmonary:     Effort: Pulmonary effort is normal.     Breath sounds: Examination of the right-middle field reveals decreased breath sounds. Examination of the left-middle field reveals decreased breath sounds. Examination of the right-lower field reveals decreased breath sounds. Examination of the left-lower field reveals decreased breath sounds. Decreased breath sounds present. No wheezing, rhonchi or rales.  Lymphadenopathy:     Cervical: No cervical adenopathy.  Neurological:     Mental Status: She is oriented to person, place, and time.  No results found for any visits on 03/14/24.    Assessment & Plan:    Problem List Items Addressed This Visit     Asthma, chronic - Primary   Acute asthma exacerbation associated with chills and fatigue. Onset 2months ago, waxing and waning. No improvement with robitussin Current use of breo inhaler daily Does not use albuterol  or montelukast   Peak flow reading today: 160 Continue robitussin as needed Maintain adequate oral hydration Use albuterol  2puffs 3x/day x 2days, then as needed Resume montelukast  10mg  daily Hold breo inhaler while taking oral prednisone . Start avelox  oral antibiotics x 7days Call office if no improvement in 1weeks.      Relevant Medications   montelukast  (SINGULAIR ) 10 MG tablet   predniSONE  (DELTASONE ) 20 MG tablet   moxifloxacin (AVELOX) 400 MG tablet   Other Relevant Orders   Peak flow meter   Return if symptoms worsen or fail to improve.     Kathrene Parents, NP

## 2024-03-27 DIAGNOSIS — J301 Allergic rhinitis due to pollen: Secondary | ICD-10-CM | POA: Diagnosis not present

## 2024-04-02 DIAGNOSIS — J301 Allergic rhinitis due to pollen: Secondary | ICD-10-CM | POA: Diagnosis not present

## 2024-04-09 DIAGNOSIS — J301 Allergic rhinitis due to pollen: Secondary | ICD-10-CM | POA: Diagnosis not present

## 2024-04-16 DIAGNOSIS — J301 Allergic rhinitis due to pollen: Secondary | ICD-10-CM | POA: Diagnosis not present

## 2024-04-21 ENCOUNTER — Other Ambulatory Visit: Payer: Self-pay | Admitting: Nurse Practitioner

## 2024-04-21 DIAGNOSIS — Z1231 Encounter for screening mammogram for malignant neoplasm of breast: Secondary | ICD-10-CM

## 2024-04-22 DIAGNOSIS — J301 Allergic rhinitis due to pollen: Secondary | ICD-10-CM | POA: Diagnosis not present

## 2024-04-23 ENCOUNTER — Ambulatory Visit
Admission: RE | Admit: 2024-04-23 | Discharge: 2024-04-23 | Disposition: A | Discharge status: Home / Self Care | Source: Ambulatory Visit | Attending: Nurse Practitioner | Admitting: Nurse Practitioner

## 2024-04-23 DIAGNOSIS — Z1231 Encounter for screening mammogram for malignant neoplasm of breast: Secondary | ICD-10-CM | POA: Diagnosis not present

## 2024-04-24 ENCOUNTER — Ambulatory Visit

## 2024-04-24 VITALS — BP 170/84 | HR 68 | Temp 97.2°F | Resp 20 | Ht 66.0 in | Wt 257.8 lb

## 2024-04-24 DIAGNOSIS — J4550 Severe persistent asthma, uncomplicated: Secondary | ICD-10-CM

## 2024-04-24 MED ORDER — BENRALIZUMAB 30 MG/ML ~~LOC~~ SOSY
30.0000 mg | PREFILLED_SYRINGE | Freq: Once | SUBCUTANEOUS | Status: AC
  Administered 2024-04-24: 30 mg via SUBCUTANEOUS

## 2024-04-24 NOTE — Progress Notes (Signed)
 Diagnosis: Asthma  Provider:  Mannam, Praveen MD  Procedure: Injection  Fasenra  (Benralizumab ), Dose: 30 mg, Site: subcutaneous, Number of injections: 1  Injection Site(s): Right arm  Post Care: Patient declined observation  Discharge: Condition: Good, Destination: Home . AVS Provided  Performed by:  Leita FORBES Miles, LPN

## 2024-04-30 DIAGNOSIS — J301 Allergic rhinitis due to pollen: Secondary | ICD-10-CM | POA: Diagnosis not present

## 2024-05-06 ENCOUNTER — Other Ambulatory Visit: Payer: Self-pay | Admitting: Nurse Practitioner

## 2024-05-06 DIAGNOSIS — E1169 Type 2 diabetes mellitus with other specified complication: Secondary | ICD-10-CM

## 2024-05-07 DIAGNOSIS — J301 Allergic rhinitis due to pollen: Secondary | ICD-10-CM | POA: Diagnosis not present

## 2024-05-11 ENCOUNTER — Other Ambulatory Visit: Payer: Self-pay | Admitting: Nurse Practitioner

## 2024-05-11 DIAGNOSIS — I10 Essential (primary) hypertension: Secondary | ICD-10-CM

## 2024-05-11 DIAGNOSIS — E782 Mixed hyperlipidemia: Secondary | ICD-10-CM

## 2024-05-28 DIAGNOSIS — J301 Allergic rhinitis due to pollen: Secondary | ICD-10-CM | POA: Diagnosis not present

## 2024-06-04 DIAGNOSIS — J301 Allergic rhinitis due to pollen: Secondary | ICD-10-CM | POA: Diagnosis not present

## 2024-06-12 DIAGNOSIS — J301 Allergic rhinitis due to pollen: Secondary | ICD-10-CM | POA: Diagnosis not present

## 2024-06-19 ENCOUNTER — Telehealth: Payer: Self-pay

## 2024-06-19 ENCOUNTER — Ambulatory Visit: Payer: Self-pay

## 2024-06-19 ENCOUNTER — Ambulatory Visit

## 2024-06-19 VITALS — BP 182/96 | HR 81 | Temp 97.6°F | Resp 20 | Ht 66.0 in | Wt 255.8 lb

## 2024-06-19 DIAGNOSIS — J4550 Severe persistent asthma, uncomplicated: Secondary | ICD-10-CM | POA: Diagnosis not present

## 2024-06-19 MED ORDER — BENRALIZUMAB 30 MG/ML ~~LOC~~ SOSY
30.0000 mg | PREFILLED_SYRINGE | Freq: Once | SUBCUTANEOUS | Status: AC
  Administered 2024-06-19: 30 mg via SUBCUTANEOUS
  Filled 2024-06-19: qty 1

## 2024-06-19 NOTE — Telephone Encounter (Signed)
 Patient is not calling with new medical complaint, states no change from previous visit. Requesting new podiatry referral.  Answer Assessment - Initial Assessment Questions 1. REASON FOR CALL or QUESTION: What is your reason for calling today? or How can I best     Pt states she has blisters on both 2nd toes d/t hammertoe and has seen provider for same. Pt would like referral to podiatrist [place], female if possible. States previously referred to a provider too far from her.  Protocols used: PCP Call - No Triage-A-AH Copied from CRM [id number]. Topic: Clinical - Red Word Triage >> Jun 19, 2024  2:44 PM Carlyon D wrote: Red Word that prompted transfer to Nurse Triage: painful feet/ blisters on some of her toes/ callus under a toe.

## 2024-06-19 NOTE — Telephone Encounter (Signed)
 Called patient to ask about the referral to podiatry. She stated that the person she spoke to on the phone earlier today told her that a referral was placed and that she never received a call. I informed her that the referral was sent to [facility] in [facility].  She wants a referral sent closer to her home. I asked patient if she could look up which doctor by getting office name, telephone number and fax number. Asked her to send in the information via MyChart or give the office a call and we will send to the place she referred.

## 2024-06-19 NOTE — Telephone Encounter (Signed)
 Auth Submission: NO AUTH NEEDED Site of care: Site of care: [facility] Payer: Medicare A/B with BCBS supplement Medication & CPT/J Code(s) submitted: Fasenra  (Benralizumab ) G9482 Diagnosis Code:  Route of submission (phone, fax, portal):  Phone # Fax # Auth type: Buy/Bill PB Units/visits requested: 30mg  x 3 doses Reference number:  Approval from: 06/19/24 to 11/08/24

## 2024-06-19 NOTE — Progress Notes (Signed)
 Diagnosis: Asthma  Provider:  Mannam, Praveen MD  Procedure: Injection  Fasenra  (Benralizumab ), Dose: 30 mg, Site: subcutaneous, Number of injections: 1  Injection Site(s): Left arm  Post Care: Patient declined observation  Discharge: Condition: Good, Destination: Home . AVS Provided  Performed by:  Leita FORBES Miles, LPN

## 2024-06-24 ENCOUNTER — Other Ambulatory Visit (INDEPENDENT_AMBULATORY_CARE_PROVIDER_SITE_OTHER)

## 2024-06-24 ENCOUNTER — Ambulatory Visit: Admitting: Nurse Practitioner

## 2024-06-24 ENCOUNTER — Encounter: Payer: Self-pay | Admitting: Nurse Practitioner

## 2024-06-24 VITALS — BP 132/76 | HR 61 | Temp 98.3°F | Ht 66.0 in | Wt 256.8 lb

## 2024-06-24 DIAGNOSIS — E785 Hyperlipidemia, unspecified: Secondary | ICD-10-CM

## 2024-06-24 DIAGNOSIS — E099 Drug or chemical induced diabetes mellitus without complications: Secondary | ICD-10-CM

## 2024-06-24 DIAGNOSIS — T380X5S Adverse effect of glucocorticoids and synthetic analogues, sequela: Secondary | ICD-10-CM | POA: Diagnosis not present

## 2024-06-24 DIAGNOSIS — I10 Essential (primary) hypertension: Secondary | ICD-10-CM

## 2024-06-24 DIAGNOSIS — E1169 Type 2 diabetes mellitus with other specified complication: Secondary | ICD-10-CM | POA: Diagnosis not present

## 2024-06-24 DIAGNOSIS — G629 Polyneuropathy, unspecified: Secondary | ICD-10-CM

## 2024-06-24 DIAGNOSIS — Z23 Encounter for immunization: Secondary | ICD-10-CM

## 2024-06-24 LAB — COMPREHENSIVE METABOLIC PANEL WITH GFR
ALT: 13 U/L (ref 0–35)
AST: 17 U/L (ref 0–37)
Albumin: 4.2 g/dL (ref 3.5–5.2)
Alkaline Phosphatase: 76 U/L (ref 39–117)
BUN: 13 mg/dL (ref 6–23)
CO2: 27 meq/L (ref 19–32)
Calcium: 9.6 mg/dL (ref 8.4–10.5)
Chloride: 108 meq/L (ref 96–112)
Creatinine, Ser: 0.93 mg/dL (ref 0.40–1.20)
GFR: 59.75 mL/min — ABNORMAL LOW (ref >60.00)
Glucose, Bld: 87 mg/dL (ref 70–99)
Potassium: 3.6 meq/L (ref 3.5–5.1)
Sodium: 135 meq/L (ref 135–145)
Total Bilirubin: 0.6 mg/dL (ref 0.2–1.2)
Total Protein: 7.9 g/dL (ref 6.0–8.3)

## 2024-06-24 LAB — HEMOGLOBIN A1C: Hgb A1c MFr Bld: 6.4 % (ref 4.6–6.5)

## 2024-06-24 NOTE — Assessment & Plan Note (Signed)
 LDL at goal with crestor  and zetia 

## 2024-06-24 NOTE — Patient Instructions (Addendum)
 Go to lab Maintain Heart healthy diet and daily exercise. Maintain current medications. Schedule appointment with ophthalmology. You need an annual DIABETES eye exam

## 2024-06-24 NOTE — Assessment & Plan Note (Signed)
 BP at goal with furosemide  10mg  daily BP Readings from Last 3 Encounters:  06/24/24 132/76  06/19/24 (!) 182/96  04/24/24 (!) 170/84   Continue med dose Repeat CMP

## 2024-06-24 NOTE — Progress Notes (Signed)
 Established Patient Visit  Patient: Rachael Silva   DOB: [birth year]   78 y.o. Female  MRN: [id number] Visit Date: 06/24/2024  Subjective:    Chief Complaint  Patient presents with   Follow-up    6 month follow up for DM HTN, Hyperlipidemia    HPI Essential hypertension BP at goal with furosemide  10mg  daily BP Readings from Last 3 Encounters:  06/24/24 132/76  06/19/24 (!) 182/96  04/24/24 (!) 170/84   Continue med dose Repeat CMP  Steroid-induced diabetes mellitus (HCC) Controlled with diet Advised to schedule DIABETES eye exam Normal UACr LDL and BP at goal Positive neuropathy, requested referral to podiatry Repeat Cmp, and hgbA1c F/up in 6months  Hyperlipidemia associated with type 2 diabetes mellitus (HCC) LDL at goal with crestor  and zetia   Reviewed medical, surgical, and social history today  Medications: Outpatient Medications Prior to Visit  Medication Sig   albuterol  (VENTOLIN  HFA) 108 (90 Base) MCG/ACT inhaler Inhale 1-2 puffs into the lungs every 6 (six) hours as needed for wheezing or shortness of breath.   B Complex-C-Folic Acid  (STRESS B COMPLEX PO) Take 1 tablet by mouth daily.   Calcium  Carbonate-Vitamin D  (CALTRATE 600+D PO) Take 1 tablet by mouth 2 (two) times daily.   cetirizine  (ZYRTEC ) 10 MG tablet TAKE 1 TABLET BY MOUTH EVERY DAY   co-enzyme Q-10 30 MG capsule Take 200 mg by mouth daily.   EPIPEN  2-PAK 0.3 MG/0.3ML SOAJ injection See admin instructions.   ezetimibe  (ZETIA ) 10 MG tablet TAKE 1 TABLET BY MOUTH EVERY DAY   fluticasone  furoate-vilanterol (BREO ELLIPTA ) 200-25 MCG/ACT AEPB Inhale 1 puff into the lungs daily.   furosemide  (LASIX ) 20 MG tablet TAKE 1/2 TABLET BY MOUTH DAILY   GARLIC PO Take 1 capsule by mouth daily.   montelukast  (SINGULAIR ) 10 MG tablet Take 1 tablet (10 mg total) by mouth at bedtime.   omeprazole  (PRILOSEC) 20 MG capsule Take 1 capsule (20 mg total) by mouth daily.   rosuvastatin  (CRESTOR ) 10 MG  tablet Take 1 tablet (10 mg total) by mouth 3 (three) times a week.   [DISCONTINUED] predniSONE  (DELTASONE ) 20 MG tablet Take 2 tablets (40 mg total) by mouth daily with breakfast.   No facility-administered medications prior to visit.   Reviewed past medical and social history.   ROS per HPI above  Last lipids Lab Results  Component Value Date   CHOL 155 12/26/2023   HDL 77.40 12/26/2023   LDLCALC 69 12/26/2023   LDLDIRECT 89.0 06/20/2022   TRIG 44.0 12/26/2023   CHOLHDL 2 12/26/2023   Last hemoglobin A1c Lab Results  Component Value Date   HGBA1C 6.2 12/26/2023        Objective:  BP 132/76 (BP Location: Right Arm, Patient Position: Sitting, Cuff Size: Large)   Pulse 61   Temp 98.3 F (36.8 C) (Oral)   Ht 5' 6 (1.676 m)   Wt 256 lb 12.8 oz (116.5 kg)   SpO2 97%   BMI 41.45 kg/m      Physical Exam Vitals and nursing note reviewed.  Cardiovascular:     Rate and Rhythm: Normal rate and regular rhythm.     Pulses: Normal pulses.     Heart sounds: Normal heart sounds.  Pulmonary:     Effort: Pulmonary effort is normal.     Breath sounds: Normal breath sounds.  Musculoskeletal:     Right lower leg: No edema.  Left lower leg: No edema.  Neurological:     Mental Status: She is alert and oriented to person, place, and time.     No results found for any visits on 06/24/24.    Assessment & Plan:    Problem List Items Addressed This Visit     Essential hypertension - Primary   BP at goal with furosemide  10mg  daily BP Readings from Last 3 Encounters:  06/24/24 132/76  06/19/24 (!) 182/96  04/24/24 (!) 170/84   Continue med dose Repeat CMP      Relevant Orders   Comprehensive metabolic panel with GFR   Hyperlipidemia associated with type 2 diabetes mellitus (HCC)   LDL at goal with crestor  and zetia       Relevant Orders   Hemoglobin A1c   Comprehensive metabolic panel with GFR   Peripheral polyneuropathy   Relevant Orders   Ambulatory referral  to Podiatry   Steroid-induced diabetes mellitus (HCC)   Controlled with diet Advised to schedule DIABETES eye exam Normal UACr LDL and BP at goal Positive neuropathy, requested referral to podiatry Repeat Cmp, and hgbA1c F/up in 6months      Other Visit Diagnoses       Immunization due       Relevant Orders   Flu vaccine HIGH DOSE PF(Fluzone Trivalent) (Completed)      Return in about 6 months (around 12/22/2024) for HTN, DM, hyperlipidemia (fasting).     Roselie Mood, NP

## 2024-06-24 NOTE — Assessment & Plan Note (Addendum)
 Controlled with diet Advised to schedule DIABETES eye exam Normal UACr LDL and BP at goal Positive neuropathy, requested referral to podiatry Repeat Cmp, and hgbA1c F/up in 6months

## 2024-06-25 ENCOUNTER — Ambulatory Visit: Payer: Self-pay | Admitting: Nurse Practitioner

## 2024-06-26 ENCOUNTER — Ambulatory Visit: Admitting: Nurse Practitioner

## 2024-07-02 DIAGNOSIS — J301 Allergic rhinitis due to pollen: Secondary | ICD-10-CM | POA: Diagnosis not present

## 2024-07-03 ENCOUNTER — Ambulatory Visit

## 2024-07-03 ENCOUNTER — Ambulatory Visit (INDEPENDENT_AMBULATORY_CARE_PROVIDER_SITE_OTHER)

## 2024-07-03 ENCOUNTER — Encounter: Payer: Self-pay | Admitting: Internal Medicine

## 2024-07-03 DIAGNOSIS — G8929 Other chronic pain: Secondary | ICD-10-CM

## 2024-07-03 DIAGNOSIS — M2042 Other hammer toe(s) (acquired), left foot: Secondary | ICD-10-CM | POA: Diagnosis not present

## 2024-07-03 DIAGNOSIS — M2041 Other hammer toe(s) (acquired), right foot: Secondary | ICD-10-CM

## 2024-07-03 DIAGNOSIS — D239 Other benign neoplasm of skin, unspecified: Secondary | ICD-10-CM | POA: Diagnosis not present

## 2024-07-03 DIAGNOSIS — M21622 Bunionette of left foot: Secondary | ICD-10-CM

## 2024-07-03 DIAGNOSIS — M79676 Pain in unspecified toe(s): Secondary | ICD-10-CM

## 2024-07-03 NOTE — Progress Notes (Signed)
 Subjective:  Patient ID: Rachael Silva, female    DOB: 28-May-1946,  MRN: [id number]  Chief Complaint  Patient presents with   Hammer Toe    1 NP - hammertoes and blisters,callus    78 y.o. female presents with the above complaint.  She has dealt with bilateral second hammertoes for extended period of time.  These can cause her pain in shoe gear.  She feels that the top of the toes can become prominent and rub on the shoe.  She has attempted toe spacers without success.  She additionally complains of a callus on the outside, bottom of her left foot. Well controlled diabetic with A1c of 6.4%.  Review of Systems: Negative except as noted in the HPI. Denies N/V/F/Ch.  Past Medical History:  Diagnosis Date   Abnormal mammogram with microcalcification 06/06/2011   Allergy     allergic rhinitis   Angioedema 02/2006   Arthritis    OA of knees   Asthma    Cellulitis of finger of right hand 02/14/2018   Complication of anesthesia    apnea- sleep   COPD (chronic obstructive pulmonary disease) (HCC) 2010   Dizziness 02/26/2017   Elevated liver enzymes 03/20/2018   Frequent headaches    GERD (gastroesophageal reflux disease) 2017   Glucocorticoids and synthetic analogues causing adverse effect in therapeutic use 08/27/2014   Hiatal hernia    HOT FLASHES 02/18/2008   Qualifier: Diagnosis of   By: Randeen MD, Laine Caldron      Hypertension    Hypothyroid    Labile blood pressure    Long term current use of systemic steroids 08/27/2014   Lower extremity edema    Lung disease    Night sweats 12/01/2016   Pneumonia    Pneumonia, eosinophilic (HCC)    Productive cough 05/27/2014   Shortness of breath dyspnea    Sleep apnea    Swelling of right hand 02/14/2018   Syncopal episodes 05/11/2016   Urinary incontinence    Weight gain due to medication 02/18/2008   Patient with diagnoses of underlying obesity.  Started on prednisone  (high dose 60 mg, with tapering dose) on September 30 for  suspected hypersensitivity pneumonitis versus ABPA eosinophilic pneumonia      Wheezing     Current Outpatient Medications:    albuterol  (VENTOLIN  HFA) 108 (90 Base) MCG/ACT inhaler, Inhale 1-2 puffs into the lungs every 6 (six) hours as needed for wheezing or shortness of breath., Disp: 8.5 each, Rfl: 3   B Complex-C-Folic Acid  (STRESS B COMPLEX PO), Take 1 tablet by mouth daily., Disp: , Rfl:    Calcium  Carbonate-Vitamin D  (CALTRATE 600+D PO), Take 1 tablet by mouth 2 (two) times daily., Disp: , Rfl:    cetirizine  (ZYRTEC ) 10 MG tablet, TAKE 1 TABLET BY MOUTH EVERY DAY, Disp: 90 tablet, Rfl: 2   co-enzyme Q-10 30 MG capsule, Take 200 mg by mouth daily., Disp: , Rfl:    EPIPEN  2-PAK 0.3 MG/0.3ML SOAJ injection, See admin instructions., Disp: , Rfl: 99   ezetimibe  (ZETIA ) 10 MG tablet, TAKE 1 TABLET BY MOUTH EVERY DAY, Disp: 90 tablet, Rfl: 0   fluticasone  furoate-vilanterol (BREO ELLIPTA ) 200-25 MCG/ACT AEPB, Inhale 1 puff into the lungs daily., Disp: 60 each, Rfl: 5   furosemide  (LASIX ) 20 MG tablet, TAKE 1/2 TABLET BY MOUTH DAILY, Disp: 45 tablet, Rfl: 0   GARLIC PO, Take 1 capsule by mouth daily., Disp: , Rfl:    montelukast  (SINGULAIR ) 10 MG tablet, Take 1 tablet (10 mg  total) by mouth at bedtime., Disp: 90 tablet, Rfl: 3   omeprazole  (PRILOSEC) 20 MG capsule, Take 1 capsule (20 mg total) by mouth daily., Disp: , Rfl:    rosuvastatin  (CRESTOR ) 10 MG tablet, Take 1 tablet (10 mg total) by mouth 3 (three) times a week., Disp: 36 tablet, Rfl: 1  Social History   Tobacco Use  Smoking Status Never  Smokeless Tobacco Never    Allergies  Allergen Reactions   Lisinopril  Diarrhea   Atorvastatin      Muscle pain    Erythromycin     REACTION: feels sick   Objective:  There were no vitals filed for this visit. There is no height or weight on file to calculate BMI. Constitutional Well developed. Well nourished. Oriented to person, place, and time.  Vascular Dorsalis pedis pulses  palpable bilaterally. Posterior tibial pulses palpable bilaterally. Capillary refill normal to all digits.  No cyanosis or clubbing noted. Pedal hair growth normal.  Neurologic Normal speech. Epicritic sensation to light touch grossly present bilaterally. Negative tinel sign at tarsal tunnel bilaterally.   Dermatologic Skin texture and turgor are within normal limits.  No open wounds. Hyperkeratotic lesion without central core at the plantar lateral left fifth MTP  Musculoskeletal: 5 out of 5 muscle muscle strength in all major pedal muscle groups.  Rectus foot shape.  Dorsiflexion contracture of bilateral second metatarsophalangeal joints with corresponding rigid hammertoes.  There is minor callus on the dorsal aspect of bilateral proximal interphalangeal joints. Tailor's bunion on left foot without pain to palpation.    Radiographs: Taken and reviewed.  3 weightbearing views of bilateral feet were taken today.  These demonstrate a normal parabola.  There is adduction of digits 2 through 5 bilaterally at the level of the metatarsophalangeal joint.  there is a dorsiflexion contracture at the second metatarsophalangeal joints bilaterally.  Tailor's bunion present on the left foot with increased IM 4-5.  Assessment:   1. Toe pain, chronic, unspecified laterality   2. Hammertoe of second toe of left foot   3. Hammertoe of second toe of right foot   4. Benign epithelial neoplasm of skin   5. Tailor's bunionette, left    Plan:  - Patient was evaluated and treated and all questions answered.  Hammertoe second toe of bilateral feet -Discussed with the patient the diagnosis of bilateral hammertoes to second digits.  They are a rigid deformity. -We discussed both conservative and surgical treatment options.  At this time, patient would like to pursue conservative treatment options.  Today, she was dispensed a gel sleeve size small for both 2nd digits.  She will attempt these to see if this  decreases the pain and pressure placed on the toes. -Discussed that if patient does not find relief with these, surgical intervention can be considered.  I discussed that because the third digit is deviating towards the hallux, she would likely require reconstruction of all toes to create room for second digit to be in a rectus position. We will also consider tailor's bunionectomy of left foot.  - Patient does get infusions of benralizumab  for her severe asthma.  She would need to see the PCP or prescribing provider for management perioperatively. She states she has had issues with anesthesia during colonoscopy.   Benign epithelial neoplasm of skin - Discussed patient's hyperkeratotic lesion on the left foot.  This was trimmed down sharply utilizing a 312 blade without incident.  She expressed satisfaction. - Discussed changing shoe gear to accommodate her forefoot  RTC PRN to discuss surgical options if   Prentice Ovens, [facility] Trained Podiatric Surgeon [facility]

## 2024-07-09 DIAGNOSIS — J301 Allergic rhinitis due to pollen: Secondary | ICD-10-CM | POA: Diagnosis not present

## 2024-07-11 DIAGNOSIS — J301 Allergic rhinitis due to pollen: Secondary | ICD-10-CM | POA: Diagnosis not present

## 2024-07-16 DIAGNOSIS — J301 Allergic rhinitis due to pollen: Secondary | ICD-10-CM | POA: Diagnosis not present

## 2024-07-23 DIAGNOSIS — J301 Allergic rhinitis due to pollen: Secondary | ICD-10-CM | POA: Diagnosis not present

## 2024-07-30 DIAGNOSIS — J301 Allergic rhinitis due to pollen: Secondary | ICD-10-CM | POA: Diagnosis not present

## 2024-08-01 ENCOUNTER — Other Ambulatory Visit: Payer: Self-pay | Admitting: Internal Medicine

## 2024-08-01 NOTE — Telephone Encounter (Signed)
 Copied from CRM [id number]. Topic: Clinical - Medication Refill >> Aug 01, 2024  4:14 PM Nathanel DEL wrote: Medication: luticasone furoate-vilanterol (BREO ELLIPTA ) 200-25 MCG/ACT AEPB  Has the patient contacted their pharmacy? Yes (Agent: If no, request that the patient contact the pharmacy for the refill. If patient does not wish to contact the pharmacy document the reason why and proceed with request.) (Agent: If yes, when and what did the pharmacy advise?)  This is the patient's preferred pharmacy:  [facility] [phone], [place] - [address] [place] [address] Phone: [phone] Fax: [phone]  Is this the correct pharmacy for this prescription? Yes If no, delete pharmacy and type the correct one.   Has the prescription been filled recently? Yes  Is the patient out of the medication? Yes (Almost-2 days left) Has the patient been seen for an appointment in the last year OR does the patient have an upcoming appointment? Yes  Can we respond through MyChart? Yes  Rx was refused b/c pt needs appt.  Pt has made appt, first available is Jan 21,2026.  Can you send enough refills to get her through to her appt?

## 2024-08-04 ENCOUNTER — Other Ambulatory Visit: Payer: Self-pay | Admitting: Adult Health

## 2024-08-04 MED ORDER — FLUTICASONE FUROATE-VILANTEROL 200-25 MCG/ACT IN AEPB: 1.0000 | INHALATION_SPRAY | Freq: Every day | RESPIRATORY_TRACT | 0 refills | Status: DC

## 2024-08-04 NOTE — Telephone Encounter (Unsigned)
 Copied from CRM [id number]. Topic: Clinical - Medication Refill >> Aug 01, 2024  4:02 PM Nathanel DEL wrote: Medication: luticasone furoate-vilanterol (BREO ELLIPTA ) 200-25 MCG/ACT AEPB  Has the patient contacted their pharmacy? Yes (Told her to call her dr This is the patient's preferred pharmacy:  [facility] [phone] [facility], [place] - [address] [place] [address] Phone: [phone] Fax: [phone]  Is this the correct pharmacy for this prescription? Yes If no, delete pharmacy and type the correct one.   Has the prescription been filled recently? No  Is the patient out of the medication? Yes  Has the patient been seen for an appointment in the last year OR does the patient have an upcoming appointment? Yes, but not until 10/2024  Can we respond through MyChart? No  Agent: Please be advised that Rx refills may take up to 3 business days. We ask that you follow-up with your pharmacy.

## 2024-08-04 NOTE — Telephone Encounter (Signed)
 Copied from CRM [id number]. Topic: Clinical - Medication Refill >> Aug 01, 2024  4:02 PM Nathanel DEL wrote: Medication: luticasone furoate-vilanterol (BREO ELLIPTA ) 200-25 MCG/ACT AEPB  Has the patient contacted their pharmacy? Yes (Told her to call her dr This is the patient's preferred pharmacy:  [phone] [facility], [place] - [address] [place] [address] Phone: [phone] Fax: [phone]  Is this the correct pharmacy for this prescription? Yes If no, delete pharmacy and type the correct one.   Has the prescription been filled recently? No  Is the patient out of the medication? Yes  Has the patient been seen for an appointment in the last year OR does the patient have an upcoming appointment? Yes, but not until 10/2024  Can we respond through MyChart? No  Agent: Please be advised that Rx refills may take up to 3 business days. We ask that you follow-up with your pharmacy.

## 2024-08-06 DIAGNOSIS — J301 Allergic rhinitis due to pollen: Secondary | ICD-10-CM | POA: Diagnosis not present

## 2024-08-14 ENCOUNTER — Ambulatory Visit (INDEPENDENT_AMBULATORY_CARE_PROVIDER_SITE_OTHER)

## 2024-08-14 VITALS — BP 160/92 | HR 89 | Temp 97.6°F | Resp 18 | Ht 66.0 in | Wt 254.4 lb

## 2024-08-14 DIAGNOSIS — J4550 Severe persistent asthma, uncomplicated: Secondary | ICD-10-CM | POA: Diagnosis not present

## 2024-08-14 MED ORDER — BENRALIZUMAB 30 MG/ML ~~LOC~~ SOSY
30.0000 mg | PREFILLED_SYRINGE | Freq: Once | SUBCUTANEOUS | Status: AC
  Administered 2024-08-14: 30 mg via SUBCUTANEOUS
  Filled 2024-08-14: qty 1

## 2024-08-14 NOTE — Progress Notes (Signed)
 Diagnosis: Asthma  Provider:  Mannam, Praveen MD  Procedure: Injection  Fasenra  (Benralizumab ), Dose: 30 mg, Site: subcutaneous, Number of injections: 1  Injection Site(s): Left arm  Post Care: Patient declined observation  Discharge: Condition: Good, Destination: Home . AVS Declined  Performed by:  Lakota Schweppe, RN

## 2024-08-17 ENCOUNTER — Other Ambulatory Visit: Payer: Self-pay | Admitting: Nurse Practitioner

## 2024-08-17 DIAGNOSIS — E782 Mixed hyperlipidemia: Secondary | ICD-10-CM

## 2024-08-17 DIAGNOSIS — I10 Essential (primary) hypertension: Secondary | ICD-10-CM

## 2024-08-18 NOTE — Telephone Encounter (Signed)
 LOV 06/24/2024 FOV 12/23/2024 Last rf 05/12/2024. Refills sent.

## 2024-08-20 DIAGNOSIS — J301 Allergic rhinitis due to pollen: Secondary | ICD-10-CM | POA: Diagnosis not present

## 2024-08-27 DIAGNOSIS — J301 Allergic rhinitis due to pollen: Secondary | ICD-10-CM | POA: Diagnosis not present

## 2024-09-10 DIAGNOSIS — J340 Abscess, furuncle and carbuncle of nose: Secondary | ICD-10-CM | POA: Diagnosis not present

## 2024-09-10 DIAGNOSIS — J309 Allergic rhinitis, unspecified: Secondary | ICD-10-CM | POA: Diagnosis not present

## 2024-09-10 DIAGNOSIS — R040 Epistaxis: Secondary | ICD-10-CM | POA: Diagnosis not present

## 2024-09-12 ENCOUNTER — Other Ambulatory Visit: Payer: Self-pay | Admitting: Nurse Practitioner

## 2024-09-12 ENCOUNTER — Other Ambulatory Visit: Payer: Self-pay | Admitting: Internal Medicine

## 2024-09-12 DIAGNOSIS — J4541 Moderate persistent asthma with (acute) exacerbation: Secondary | ICD-10-CM

## 2024-09-16 ENCOUNTER — Ambulatory Visit: Payer: Self-pay

## 2024-09-16 DIAGNOSIS — H35033 Hypertensive retinopathy, bilateral: Secondary | ICD-10-CM | POA: Diagnosis not present

## 2024-09-16 DIAGNOSIS — E119 Type 2 diabetes mellitus without complications: Secondary | ICD-10-CM | POA: Diagnosis not present

## 2024-09-16 DIAGNOSIS — H53143 Visual discomfort, bilateral: Secondary | ICD-10-CM | POA: Diagnosis not present

## 2024-09-16 DIAGNOSIS — I10 Essential (primary) hypertension: Secondary | ICD-10-CM | POA: Diagnosis not present

## 2024-09-16 DIAGNOSIS — H5203 Hypermetropia, bilateral: Secondary | ICD-10-CM | POA: Diagnosis not present

## 2024-09-16 DIAGNOSIS — H524 Presbyopia: Secondary | ICD-10-CM | POA: Diagnosis not present

## 2024-09-16 DIAGNOSIS — H52223 Regular astigmatism, bilateral: Secondary | ICD-10-CM | POA: Diagnosis not present

## 2024-09-16 LAB — OPHTHALMOLOGY REPORT-SCANNED

## 2024-09-16 NOTE — Telephone Encounter (Signed)
 FYI Only or Action Required?: Action required by provider: update on patient condition and please advise Rachael Silva in- currently scheduled with Geni Shutter on Friday .  Patient was last seen in primary care on 06/24/2024 by Nche, Rachael Rockford, NP.  Called Nurse Triage reporting Epistaxis and Hypertension.  Symptoms began a week ago.  Interventions attempted: Prescription medications: lasix .  Symptoms are: stable.  Triage Disposition: See PCP Within 2 Weeks  Patient/caregiver understands and will follow disposition?: Yes  Copied from CRM [id number]. Topic: Clinical - Red Word Triage >> Sep 16, 2024 12:38 PM Eva FALCON wrote: Red Word that prompted transfer to Nurse Triage: States she went to ENT doctor last week, had nose bleeds for days, was told a blood vessel popped. States her bp has been high. Unable to provide accurate numbers , says one morning its been as low as 115, head feels funny. Reason for Disposition  [1] Systolic BP >= 130 OR Diastolic >= 80 AND [2] taking BP medications  Answer Assessment - Initial Assessment Questions Hx of nasal polyps-- nosebleeds- went to ENT for cauterization. Burst blood vessel. ENT advised PCP appt.  BP has been elevated a little- only one normal reading in the last week. BP max 158/85. Denies CP, SOB, Dizziness, fever, blurred vision, or balance issues.  Head feels full, back of head feels funny. Not really pain.   ED precautions advised and understood. Appt with PCP office 12/12 to assess. Please reach out to patient if Rachael has Silva in sooner than Friday.    1. BLOOD PRESSURE: What is your blood pressure? Did you take at least two measurements 5 minutes apart?     Been high- about SBP 158/85 2. ONSET: When did you take your blood pressure?     thanksgiving 3. HOW: How did you take your blood pressure? (e.g., automatic home BP monitor, visiting nurse)     Arm cuff 4. HISTORY: Do you have a history of high blood  pressure?     Hx of htn  5. MEDICINES: Are you taking any medicines for blood pressure? Have you missed any doses recently?     1/2 tab lasix - no missed doses 6. OTHER SYMPTOMS: Do you have any symptoms? (e.g., blurred vision, chest pain, difficulty breathing, headache, weakness)     Head pressure  Protocols used: Blood Pressure - High-A-AH

## 2024-09-17 DIAGNOSIS — J301 Allergic rhinitis due to pollen: Secondary | ICD-10-CM | POA: Diagnosis not present

## 2024-09-19 NOTE — Telephone Encounter (Signed)
 Tried calling patient multiple times today and left a voice messages asking to give the office a call back

## 2024-09-24 NOTE — Telephone Encounter (Signed)
 Tried calling patient and could not leave a voice message due to voice mail box is full.

## 2024-09-24 NOTE — Telephone Encounter (Signed)
 Copied from CRM #[id number]. Topic: General - Other >> Sep 22, 2024  4:17 PM Thersia C wrote: Reason for CRM: Patient called in regarding a missed call from office , would like a callback

## 2024-09-25 ENCOUNTER — Ambulatory Visit: Admitting: Family Medicine

## 2024-10-11 ENCOUNTER — Other Ambulatory Visit: Payer: Self-pay | Admitting: Internal Medicine

## 2024-10-15 ENCOUNTER — Ambulatory Visit

## 2024-10-15 VITALS — BP 155/80 | HR 77 | Temp 97.6°F | Resp 18 | Ht 66.0 in | Wt 252.2 lb

## 2024-10-15 DIAGNOSIS — J4550 Severe persistent asthma, uncomplicated: Secondary | ICD-10-CM

## 2024-10-15 MED ORDER — BENRALIZUMAB 30 MG/ML ~~LOC~~ SOSY
30.0000 mg | PREFILLED_SYRINGE | Freq: Once | SUBCUTANEOUS | Status: AC
  Administered 2024-10-15: 30 mg via SUBCUTANEOUS
  Filled 2024-10-15: qty 1

## 2024-10-15 NOTE — Progress Notes (Signed)
 Diagnosis: Asthma  Provider:  Mannam, Praveen MD  Procedure: Injection  Fasenra  (Benralizumab ), Dose: 30 mg, Site: subcutaneous, Number of injections: 1  Injection Site(s): Right arm  Post Care: Patient declined observation  Discharge: Condition: Good, Destination: Home . AVS Declined  Performed by:  Leita FORBES Miles, LPN

## 2024-10-20 ENCOUNTER — Telehealth: Payer: Self-pay

## 2024-10-20 NOTE — Telephone Encounter (Signed)
 Auth Submission: NO AUTH NEEDED Site of care: Site of care: [facility] Payer: Medicare A/B with BCBS supplement Medication & CPT/J Code(s) submitted: Fasenra  (Benralizumab ) G9482 Diagnosis Code:  Route of submission (phone, fax, portal):  Phone # Fax # Auth type: Buy/Bill PB Units/visits requested: 30mg  x 6 doses Reference number:  Approval from: 10/20/24 to 11/08/25

## 2024-10-23 ENCOUNTER — Other Ambulatory Visit: Payer: Self-pay | Admitting: Nurse Practitioner

## 2024-10-23 DIAGNOSIS — E1169 Type 2 diabetes mellitus with other specified complication: Secondary | ICD-10-CM

## 2024-10-29 ENCOUNTER — Ambulatory Visit: Admitting: Internal Medicine

## 2024-10-29 ENCOUNTER — Encounter: Payer: Self-pay | Admitting: Internal Medicine

## 2024-10-29 VITALS — BP 120/80 | HR 79 | Temp 98.5°F | Ht 66.0 in | Wt 256.0 lb

## 2024-10-29 DIAGNOSIS — G4733 Obstructive sleep apnea (adult) (pediatric): Secondary | ICD-10-CM

## 2024-10-29 DIAGNOSIS — Z8709 Personal history of other diseases of the respiratory system: Secondary | ICD-10-CM

## 2024-10-29 DIAGNOSIS — J4550 Severe persistent asthma, uncomplicated: Secondary | ICD-10-CM

## 2024-10-29 DIAGNOSIS — E669 Obesity, unspecified: Secondary | ICD-10-CM | POA: Diagnosis not present

## 2024-10-29 DIAGNOSIS — Z6841 Body Mass Index (BMI) 40.0 and over, adult: Secondary | ICD-10-CM

## 2024-10-29 DIAGNOSIS — J309 Allergic rhinitis, unspecified: Secondary | ICD-10-CM

## 2024-10-29 DIAGNOSIS — J45909 Unspecified asthma, uncomplicated: Secondary | ICD-10-CM

## 2024-10-29 NOTE — Progress Notes (Signed)
 " [facility] Consultation           SYNOPSIS 79 -year-old female past medical history of asthma. Being followed by pulmonary for chronic eosinophilic pneumonia, status post bronchoscopy in December 2016 with negative cultures for AFB and fungus. Transbronchial biopsy-positive for eosinophilic infiltrate. Treated with steroids unable to wean since  October 2015. Patient with underlying OSA-very compliant   CC: Follow-up assessment for asthma Follow-up assessment for sleep apnea   HPI  Asthma seems to be well-controlled at this time History of ABPA history of chronic prednisone  therapy Patient has severe persistent asthma which is controlled with Fasenra  therapy every 8 weeks No asthma ex cerebrations since initiation of therapy  Patient currently takes Breo 200 Has intermittent wheezing Patient is still surrounded by cats  No exacerbation at this time No evidence of heart failure at this time No evidence or signs of infection at this time No respiratory distress No fevers, chills, nausea, vomiting, diarrhea No evidence of lower extremity edema No evidence hemoptysis   Discussed sleep data and reviewed with patient.  Encouraged proper weight management.  Discussed sleep hygiene Patient uses and benefits from therapy Using CPAP nightly and with naps Settings are comfortable and is sleeping well. Excellent compliance report at 100% Auto CPAP of 11-15  AHI reduced to 1.2     BP 120/80   Pulse 79   Temp 98.5 F (36.9 C)   Ht 5' 6 (1.676 m)   Wt 256 lb (116.1 kg)   SpO2 98%   BMI 41.32 kg/m     Physical Examination:  General Appearance: No distress  EYES EOM intact.   NECK Supple, No JVD Pulmonary: normal breath sounds, No wheezing.  CardiovascularNormal S1,S2.  No m/r/g.   Ext pulses intact, cap refill intact  ALL OTHER ROS ARE NEGATIVE        Allergies:  Lisinopril , Atorvastatin , and Erythromycin            LABS Results for LOVE, CHOWNING (MRN [id number]) as of 08/01/2016 11:16  Ref. Range 07/02/2014 13:00 08/05/2015 15:12 03/30/2016 15:11  IgE (Immunoglobulin E), Serum Latest Ref Range: 0 - 100 IU/mL 2,401 (H) 1,253 (H) 1,180 (H)   Results for ADRI, SCHLOSS (MRN [id number]) as of 08/01/2016 11:16  Ref. Range 08/05/2015 15:12 09/16/2015 12:46 03/30/2016 15:11 05/03/2016 08:42 07/26/2016 15:31  Eosinophil Latest Units: % 10 11 5  5.4 (H) 3        Assessment and Plan:   79 year old pleasant African-American female seen today for follow-up for persistent asthma which is now mild in the setting of immunological and biological therapy with a history of chronic eosinophilic pneumonia and ABPA with underlying sleep apnea   Asthma severe persistent Well-controlled with Fasenra  Continue Breo 200 Avoid allergens and secondhand smoke Recommend avoiding cats   Assessment of OSA Previous AHI 15 Continue CPAP as prescribed  Excellent compliance report Reviewed compliance report in detail with patient Patient definitely benefits the use of CPAP therapy as prescribed Using CPAP nightly and with naps Pressure setting is comfortable and is sleeping well. CPAP prescription 11-15 AHI reduced to 1.2  No evidence of acute heart failure at this time No respiratory distress No fevers, chills, nausea, vomiting, diarrhea No evidence hemoptysis  Patient Instructions Continue to use CPAP every night, minimum of 4-6 hours a night.  Change equipment every 30 days or as directed by DME.  Wash your tubing with warm soap and water daily, hang to dry.  Wash humidifier portion weekly. Use  bottled, distilled water and change daily     Obesity -recommend significant weight loss -recommend changing diet  Deconditioned state -Recommend increased daily activity and exercise  Allergic rhinitis Continue antihistamines Continue Singulair  Continue Flonase   History of ABPA No indication for  prednisone  at this time Bronchoscopy with BAL in 2016 was consistent with eosinophilic pneumonia   MEDICATION ADJUSTMENTS/LABS AND TESTS ORDERED: Continue CPAP as prescribed Continue therapy for asthma Fasenra  Continue Breo as prescribed Rinse mouth after use Avoid secondhand smoke Avoid cats  CURRENT MEDICATIONS REVIEWED AT LENGTH WITH PATIENT TODAY   Patient  satisfied with Plan of action and management. All questions answered   Follow up 1 year   I spent a total of 41 minutes dedicated to the care of this patient on the date of this encounter to include pre-visit review of records, face-to-face time with the patient discussing conditions above, post visit ordering of testing, clinical documentation with the electronic health record, making appropriate referrals as documented, and communicating necessary information to the patient's healthcare team.    The Patient requires high complexity decision making for assessment and support, frequent evaluation and titration of therapies, application of advanced monitoring technologies and extensive interpretation of multiple databases.  Patient satisfied with Plan of action and management. All questions answered    Nickolas Alm Cellar, M.D.  [facility]  Medical Director [facility] [place]                "

## 2024-10-30 NOTE — Addendum Note (Signed)
 Addended byBETHA CELLAR, DAVID on: 10/30/2024 12:30 PM   Modules accepted: Level of Service

## 2024-11-28 ENCOUNTER — Ambulatory Visit: Payer: Medicare Other

## 2024-12-01 ENCOUNTER — Ambulatory Visit

## 2024-12-10 ENCOUNTER — Ambulatory Visit

## 2024-12-23 ENCOUNTER — Ambulatory Visit: Admitting: Nurse Practitioner
